# Patient Record
Sex: Male | Born: 1965 | Race: White | Hispanic: No | Smoking: Never smoker
Health system: Southern US, Community
[De-identification: ages and names within clinical notes are randomized; demographics above are authoritative.]

## PROBLEM LIST (undated history)

## (undated) DIAGNOSIS — F329 Major depressive disorder, single episode, unspecified: Secondary | ICD-10-CM

## (undated) DIAGNOSIS — J45909 Unspecified asthma, uncomplicated: Secondary | ICD-10-CM

## (undated) DIAGNOSIS — G629 Polyneuropathy, unspecified: Secondary | ICD-10-CM

## (undated) DIAGNOSIS — Z5189 Encounter for other specified aftercare: Secondary | ICD-10-CM

## (undated) DIAGNOSIS — G8929 Other chronic pain: Secondary | ICD-10-CM

## (undated) DIAGNOSIS — Z9109 Other allergy status, other than to drugs and biological substances: Secondary | ICD-10-CM

## (undated) DIAGNOSIS — G709 Myoneural disorder, unspecified: Secondary | ICD-10-CM

## (undated) DIAGNOSIS — M549 Dorsalgia, unspecified: Secondary | ICD-10-CM

## (undated) DIAGNOSIS — F32A Depression, unspecified: Secondary | ICD-10-CM

## (undated) DIAGNOSIS — T7840XA Allergy, unspecified, initial encounter: Secondary | ICD-10-CM

## (undated) DIAGNOSIS — I10 Essential (primary) hypertension: Secondary | ICD-10-CM

## (undated) DIAGNOSIS — H1010 Acute atopic conjunctivitis, unspecified eye: Secondary | ICD-10-CM

## (undated) DIAGNOSIS — F419 Anxiety disorder, unspecified: Secondary | ICD-10-CM

## (undated) HISTORY — DX: Other allergy status, other than to drugs and biological substances: Z91.09

## (undated) HISTORY — DX: Anxiety disorder, unspecified: F41.9

## (undated) HISTORY — PX: ROTATOR CUFF REPAIR: SHX139

## (undated) HISTORY — DX: Allergy, unspecified, initial encounter: T78.40XA

## (undated) HISTORY — DX: Encounter for other specified aftercare: Z51.89

## (undated) HISTORY — DX: Polyneuropathy, unspecified: G62.9

## (undated) HISTORY — PX: POLYPECTOMY: SHX149

## (undated) HISTORY — PX: BACK SURGERY: SHX140

## (undated) HISTORY — PX: CARDIAC CATHETERIZATION: SHX172

## (undated) HISTORY — PX: SPINAL CORD STIMULATOR IMPLANT: SHX2422

## (undated) HISTORY — PX: CARPAL TUNNEL RELEASE: SHX101

## (undated) HISTORY — DX: Myoneural disorder, unspecified: G70.9

## (undated) HISTORY — DX: Acute atopic conjunctivitis, unspecified eye: H10.10

## (undated) HISTORY — PX: COLONOSCOPY: SHX174

## (undated) NOTE — Telephone Encounter (Signed)
 Formatting of this note might be different from the original. Outreach call placed to patient to schedule. Patient states PCP is Lorene Pouch, NP at [facility]. Electronically signed by Charlcie Rosina BRAVO, CNA at 02/24/2024  9:15 AM CDT

---

## 2005-11-03 HISTORY — PX: HAND AMPUTATION: SUR26

## 2014-11-20 DIAGNOSIS — T879 Unspecified complications of amputation stump: Secondary | ICD-10-CM | POA: Insufficient documentation

## 2015-01-11 ENCOUNTER — Encounter (HOSPITAL_BASED_OUTPATIENT_CLINIC_OR_DEPARTMENT_OTHER): Payer: Self-pay | Admitting: *Deleted

## 2015-01-11 ENCOUNTER — Emergency Department (HOSPITAL_BASED_OUTPATIENT_CLINIC_OR_DEPARTMENT_OTHER)
Admission: EM | Admit: 2015-01-11 | Discharge: 2015-01-11 | Disposition: A | Discharge status: Home / Self Care | Payer: Medicare PPO | Attending: Emergency Medicine | Admitting: Emergency Medicine

## 2015-01-11 ENCOUNTER — Emergency Department (HOSPITAL_BASED_OUTPATIENT_CLINIC_OR_DEPARTMENT_OTHER): Payer: Medicare PPO

## 2015-01-11 DIAGNOSIS — Z79899 Other long term (current) drug therapy: Secondary | ICD-10-CM | POA: Diagnosis not present

## 2015-01-11 DIAGNOSIS — M549 Dorsalgia, unspecified: Secondary | ICD-10-CM | POA: Insufficient documentation

## 2015-01-11 DIAGNOSIS — Z7951 Long term (current) use of inhaled steroids: Secondary | ICD-10-CM | POA: Diagnosis not present

## 2015-01-11 DIAGNOSIS — R1110 Vomiting, unspecified: Secondary | ICD-10-CM

## 2015-01-11 DIAGNOSIS — G8929 Other chronic pain: Secondary | ICD-10-CM | POA: Diagnosis not present

## 2015-01-11 DIAGNOSIS — B349 Viral infection, unspecified: Secondary | ICD-10-CM | POA: Diagnosis not present

## 2015-01-11 HISTORY — DX: Other chronic pain: G89.29

## 2015-01-11 HISTORY — DX: Dorsalgia, unspecified: M54.9

## 2015-01-11 LAB — CBC WITH DIFFERENTIAL/PLATELET
Basophils Absolute: 0 10*3/uL (ref 0.0–0.1)
Basophils Relative: 0 % (ref 0–1)
Eosinophils Absolute: 0 10*3/uL (ref 0.0–0.7)
Eosinophils Relative: 0 % (ref 0–5)
HEMATOCRIT: 43 % (ref 39.0–52.0)
Hemoglobin: 15 g/dL (ref 13.0–17.0)
Lymphocytes Relative: 16 % (ref 12–46)
Lymphs Abs: 1.7 10*3/uL (ref 0.7–4.0)
MCH: 32.3 pg (ref 26.0–34.0)
MCHC: 34.9 g/dL (ref 30.0–36.0)
MCV: 92.7 fL (ref 78.0–100.0)
MONO ABS: 0.6 10*3/uL (ref 0.1–1.0)
Monocytes Relative: 5 % (ref 3–12)
NEUTROS ABS: 8.2 10*3/uL — AB (ref 1.7–7.7)
Neutrophils Relative %: 79 % — ABNORMAL HIGH (ref 43–77)
PLATELETS: 205 10*3/uL (ref 150–400)
RBC: 4.64 MIL/uL (ref 4.22–5.81)
RDW: 12.7 % (ref 11.5–15.5)
WBC: 10.5 10*3/uL (ref 4.0–10.5)

## 2015-01-11 LAB — URINALYSIS, ROUTINE W REFLEX MICROSCOPIC
Bilirubin Urine: NEGATIVE
Glucose, UA: NEGATIVE mg/dL
Hgb urine dipstick: NEGATIVE
KETONES UR: 40 mg/dL — AB
Leukocytes, UA: NEGATIVE
Nitrite: NEGATIVE
PROTEIN: NEGATIVE mg/dL
SPECIFIC GRAVITY, URINE: 1.019 (ref 1.005–1.030)
Urobilinogen, UA: 0.2 mg/dL (ref 0.0–1.0)
pH: 5 (ref 5.0–8.0)

## 2015-01-11 MED ORDER — SODIUM CHLORIDE 0.9 % IV BOLUS (SEPSIS): 1000.0000 mL | Freq: Once | INTRAVENOUS | Status: DC

## 2015-01-11 MED ORDER — OXYCODONE HCL 10 MG PO TABS: 10.0000 mg | ORAL_TABLET | Freq: Four times a day (QID) | ORAL | Status: DC

## 2015-01-11 MED ORDER — SODIUM CHLORIDE 0.9 % IV SOLN: INTRAVENOUS | Status: DC

## 2015-01-11 MED ORDER — PROMETHAZINE HCL 25 MG PO TABS
25.0000 mg | ORAL_TABLET | Freq: Four times a day (QID) | ORAL | Status: DC | PRN
Start: 2015-01-11 — End: 2015-07-21

## 2015-01-11 MED ORDER — ONDANSETRON HCL 4 MG/2ML IJ SOLN
4.0000 mg | Freq: Once | INTRAMUSCULAR | Status: AC
  Administered 2015-01-11: 4 mg via INTRAVENOUS
  Filled 2015-01-11: qty 2

## 2015-01-11 MED ORDER — FLUTICASONE-SALMETEROL 230-21 MCG/ACT IN AERO: 2.0000 | INHALATION_SPRAY | Freq: Two times a day (BID) | RESPIRATORY_TRACT | Status: DC

## 2015-01-11 MED ORDER — HYDROMORPHONE HCL 1 MG/ML IJ SOLN
1.0000 mg | Freq: Once | INTRAMUSCULAR | Status: AC
  Administered 2015-01-11: 1 mg via INTRAVENOUS
  Filled 2015-01-11: qty 1

## 2015-01-11 MED ORDER — GABAPENTIN 300 MG PO CAPS: 300.0000 mg | ORAL_CAPSULE | Freq: Three times a day (TID) | ORAL | Status: DC

## 2015-01-11 MED ORDER — ALBUTEROL SULFATE HFA 108 (90 BASE) MCG/ACT IN AERS: 1.0000 | INHALATION_SPRAY | Freq: Four times a day (QID) | RESPIRATORY_TRACT | Status: DC | PRN

## 2015-01-11 NOTE — Discharge Instructions (Signed)
Workup here negative. Most likely viral illness. Return for any new or worse symptoms. Medications renewed. Resource guide provided below to help you find a regular doctor.   Emergency Department Resource Guide 1) Find a Doctor and Pay Out of Pocket Although you won't have to find out who is covered by your insurance plan, it is a good idea to ask around and get recommendations. You will then need to call the office and see if the doctor you have chosen will accept you as a new patient and what types of options they offer for patients who are self-pay. Some doctors offer discounts or will set up payment plans for their patients who do not have insurance, but you will need to ask so you aren't surprised when you get to your appointment.  2) Contact Your Local Health Department Not all health departments have doctors that can see patients for sick visits, but many do, so it is worth a call to see if yours does. If you don't know where your local health department is, you can check in your phone book. The CDC also has a tool to help you locate your state's health department, and many state websites also have listings of all of their local health departments.  3) Find a [facility] If your illness is not likely to be very severe or complicated, you may want to try a walk in clinic. These are popping up all over the country in pharmacies, drugstores, and shopping centers. They're usually staffed by nurse practitioners or physician assistants that have been trained to treat common illnesses and complaints. They're usually fairly quick and inexpensive. However, if you have serious medical issues or chronic medical problems, these are probably not your best option.  No Primary Care Doctor: - Call Health Connect at  [phone] - they can help you locate a primary care doctor that  accepts your insurance, provides certain services, etc. - Physician Referral Service- [phone]  Chronic Pain  Problems: Organization         Address  Phone   Notes  [facility]  [phone] Patients need to be referred by their primary care doctor.   Medication Assistance: Organization         Address  Phone   Notes  [facility] Medication [facility] [address] [place], [place] [address] [phone] --Must be a resident of [facility] -- Must have NO insurance coverage whatsoever (no Medicaid/ Medicare, etc.) -- The pt. MUST have a primary care doctor that directs their care regularly and follows them in the community   MedAssist  [phone]   [employer]  [phone]    Agencies that provide inexpensive medical care: Organization         Address  Phone   Notes  [place]  [phone]   [facility]    [phone]   [facility] [place], [place] [address] [phone]   [place] [address] [place] [phone]   Planned Parenthood    [phone]   [facility]    [phone]   [place] and [place] [address],  Phone:  [phone], Fax:  [phone] Hours of Operation:  9 am - 6 pm, M-F.  Also accepts Medicaid/Medicare and self-pay.  [facility] for [place] [address], [address], [place] Phone: [phone], Fax: [phone]. Hours of Operation:  8:30 am - 5:30 pm, M-F.  Also accepts Medicaid and self-pay.  [facility] [place] [address] [place] Phone: [phone]   [place], [place], [place] [phone], Ext. [phone] Mondays & Thursdays: 7-9 AM.  First 15 patients are seen on a first come, first serve basis.    [place] Providers:  Organization         Address  Phone   Notes  [facility] [address], [place] [phone] Also  accepts self-pay patients.  [facility] [address], [place]  [phone]   [place], [address], [place] [phone]   [facility] [address] [place] [phone]   Lucianne Lei [address], [place]   [phone] Only accepts [place] Access [place] patients after they have their name applied to their card.   Self-Pay (no insurance) in [facility]:  Organization         Address  Phone   Notes  Sickle Cell Patients, [facility] [place] [phone]   [facility] [place] [phone]   [facility] [place]  [place], [place], [place] [phone]   [facility]/Dr. Osei-Bonsu  [address] [place] or [address], [place] [phone] Phone number for both [place] and [place] locations is the same.  Urgent [facility] and [facility] [address] [place] [phone]   [facility] [address] [place] or [address] [phone]   [facility] [address] [place] [phone], phone; [phone], fax Sees patients 1st and 3rd Saturday of every month.  Must not qualify for public or private insurance (i.e. Medicaid, Medicare, [place] Health Choice, Veterans' Benefits)  Household income should be no more than 200% of the poverty level The clinic cannot treat you if you are pregnant or think you are pregnant  Sexually transmitted diseases are not treated at the clinic.    Dental Care: Organization         Address  Phone  Notes  [facility] Department of [facility] [phone] Accepts children up to age 80 who are enrolled in [place] or [place]; pregnant  women with a Medicaid card; and children who have applied for Medicaid or [place] Health Choice, but were declined, whose parents can pay a reduced fee at time of service.  [facility] Department of [facility]  [address], [place] [phone] Accepts children up to age 55 who are enrolled in [place] or [place]; pregnant women with a Medicaid card; and children who have applied for Medicaid or [place] Health Choice, but were declined, whose parents can pay a reduced fee at time of service.  [place] Adult [facility] Access PROGRAM  [place] [phone] Patients are seen by appointment only. Walk-ins are not accepted. [place] will see patients 4 years of age and older. Monday - Tuesday (8am-5pm) Most Wednesdays (8:30-5pm) $30 per visit, cash only  [place]  [address], [facility] [phone] Patients are seen by appointment only. Walk-ins are not accepted. [place] will see patients 11 years of age and older. One Wednesday Evening (Monthly: Volunteer Based).  $30 per visit, cash only  [place]  [phone] for adults; Children under age 81, call Graduate Pediatric Dentistry at [phone]. Children aged 67-14, please call [phone] to request a pediatric application.  Dental services are provided in all areas of dental care including fillings, crowns and bridges, complete and partial dentures, implants, gum treatment, root canals, and extractions. Preventive care is also provided. Treatment is provided to both adults and children. Patients are selected via a lottery and there is often a waiting list.   [facility] [address], [place]  [phone] [email or web address]   [facility] [address] [place], [place] [phone], Ext. [phone] Second and Fourth Thursday of each month, opens at 6:30 AM; Clinic ends at 9 AM.  Patients are  seen on a first-come first-served basis, and a limited number are seen during each clinic.   [facility]  [address] [place] Dank[place], [place] [phone]   Eligibility Requirements You must have lived in [place], [place], or [place] counties for at least the last three months.   You cannot be eligible for state or federal sponsored [employer], including [employer], [place], or [employer].   You generally cannot be eligible for healthcare insurance through your employer.    How to apply: Eligibility screenings are held every Tuesday and Wednesday afternoon from 1:00 pm until 4:00 pm. You do not need an appointment for the interview!  [facility] [address], [place], [place]   [address]  [place] Department  [place]  [phone]    Behavioral Health Resources in the Community: Intensive Outpatient Programs Organization         Address  Phone  Notes  [place]. [address], [place], [place] [phone]   [facility] Outpatient [address] [place], [place]   ADS: Alcohol & Drug Svcs [address] [place], [place]   [place] [address]  [place], [place] or [phone]   Substance Abuse Resources Organization         Address  Phone  Notes  Alcohol and Drug Services  [phone]   [place]  [phone]   The [facility] [place]   Chinita Pester  [phone]   Residential & Outpatient Substance Abuse Program  [phone]   Psychological Services Organization         Address  Phone  Notes  [facility] [place]  [place]  [phone]   [place] [address] [place] or [phone]    Mobile Crisis  Teams Organization         Address  Phone  Notes  [facility], Mobile Crisis Care Unit  [phone]   [facility]  [address] [place], [place]   [place] Levels [address] [place] [phone]    Self-Help/Support Groups Organization         Address  Phone             Notes  [place]. of [place] -  variety of support groups  [phone] Call for more information  Narcotics Anonymous (NA), [facility] [address], [employer] [place]  2 meetings at this location   Residential Facilities manager         Address  Phone  Notes  [facility] [place],    [place]  [phone]   [facility]  [address] [place] [phone], [place], [place]   [place], [place] [phone] Admissions: 8am-3pm M-F  Incentives Substance [place] [address],    [place], [place] [phone]   [facility] [address] [place], [place], [place]   [facility] [address]  [place], [place]   Insight Programs - Intensive Outpatient [address], [place], [place]   [facility] ([place]  [place], [place] [phone] or [phone]   Residential Treatment Services (RTS) [address] [place], [place] Accepts Medicaid  [facility] [place] [address],  [place] [phone] Substance Abuse/Addiction Treatment   [facility] Organization         Address  Phone  Notes  [facility]  [phone]   Domenic Schwab, PhD [address] [place], [place]   [phone] or [phone]   [place], [place] [phone]   [facility] [address] [place], [place] [phone]  Insurance/Medicaid/sponsorship through [facility] and Families [address], [address] [place]                                    [place], [place] [phone] [place] [address], [place] [phone]    Dr. Adele Schilder  [phone]   [facility] Dept. 1) [address] [place] 2) [place] 3)  [address], [place] [phone]  [phone]   [place] [phone] or [phone] (After Hours)

## 2015-01-11 NOTE — ED Notes (Signed)
Pt sts he has been vomiting x2 days. Pt also c/o chills and dizziness. Pt recently moved to the area and is also requesting a referral to a pain clinic.

## 2015-01-11 NOTE — ED Provider Notes (Signed)
CSN: [id number]     Arrival date & time 01/11/15  1443 History   First MD Initiated Contact with Patient 01/11/15 1609     Chief Complaint  Patient presents with  . Emesis     (Consider location/radiation/quality/duration/timing/severity/associated sxs/prior Treatment) Patient is a 49 y.o. male presenting with vomiting. The history is provided by the patient.  Emesis Associated symptoms: chills and diarrhea   Associated symptoms: no abdominal pain and no headaches   patient presents with the onset of congestion nausea vomiting and diarrhea fever and chills starting yesterday. No significant belly pain. No significant headache.  Patient recently moved to the area from [place]. Patient with history of chronic back pain. Followed by pain management there. Patient's also out of all of his medications. And needs them renewed.  Past Medical History  Diagnosis Date  . Chronic back pain    Past Surgical History  Procedure Laterality Date  . Hand amputation     No family history on file. History  Substance Use Topics  . Smoking status: Never Smoker   . Smokeless tobacco: Not on file  . Alcohol Use: Yes    Review of Systems  Constitutional: Positive for fever and chills.  HENT: Positive for congestion.   Eyes: Negative for redness.  Respiratory: Positive for cough. Negative for shortness of breath.   Cardiovascular: Negative for chest pain.  Gastrointestinal: Positive for nausea, vomiting and diarrhea. Negative for abdominal pain.  Genitourinary: Negative for dysuria.  Musculoskeletal: Positive for back pain.  Neurological: Negative for headaches.  Hematological: Does not bruise/bleed easily.  Psychiatric/Behavioral: Negative for confusion.      Allergies  Aspirin and Hydrocodone  Home Medications   Prior to Admission medications   Medication Sig Start Date End Date Taking? Authorizing Provider  albuterol (ACCUNEB) 1.25 MG/3ML nebulizer solution Take 1 ampule by  nebulization every 6 (six) hours as needed for wheezing.   Yes Historical Provider, MD  fluticasone-salmeterol (ADVAIR HFA) 230-21 MCG/ACT inhaler Inhale 2 puffs into the lungs 2 (two) times daily.   Yes Historical Provider, MD  gabapentin (NEURONTIN) 300 MG capsule Take 300 mg by mouth 3 (three) times daily.   Yes Historical Provider, MD  oxycodone (OXY-IR) 5 MG capsule Take 10 mg by mouth every 4 (four) hours as needed.   Yes Historical Provider, MD  albuterol (PROVENTIL HFA;VENTOLIN HFA) 108 (90 BASE) MCG/ACT inhaler Inhale 1-2 puffs into the lungs every 6 (six) hours as needed for wheezing or shortness of breath. 01/11/15   Fredia Sorrow, MD  fluticasone-salmeterol (ADVAIR HFA) 230-21 MCG/ACT inhaler Inhale 2 puffs into the lungs 2 (two) times daily. 01/11/15   Fredia Sorrow, MD  gabapentin (NEURONTIN) 300 MG capsule Take 1 capsule (300 mg total) by mouth 3 (three) times daily. 01/11/15   Fredia Sorrow, MD  Oxycodone HCl 10 MG TABS Take 1 tablet (10 mg total) by mouth every 6 (six) hours. 01/11/15   Fredia Sorrow, MD  promethazine (PHENERGAN) 25 MG tablet Take 1 tablet (25 mg total) by mouth every 6 (six) hours as needed for nausea or vomiting. 01/11/15   Fredia Sorrow, MD   BP 138/78 mmHg  Pulse 93  Temp(Src) 98.3 F (36.8 C) (Oral)  Resp 18  Ht 5\' 7"  (1.702 m)  Wt 250 lb (113.399 kg)  BMI 39.15 kg/m2  SpO2 95% Physical Exam  Constitutional: He is oriented to person, place, and time. He appears well-developed and well-nourished. No distress.  HENT:  Head: Normocephalic and atraumatic.  Mouth/Throat: Oropharynx  is clear and moist.  Eyes: Conjunctivae and EOM are normal. Pupils are equal, round, and reactive to light.  Neck: Normal range of motion.  Cardiovascular: Normal rate, regular rhythm and normal heart sounds.   Pulmonary/Chest: Effort normal and breath sounds normal. No respiratory distress.  Abdominal: Soft. Bowel sounds are normal. There is no tenderness.   Musculoskeletal: Normal range of motion.  Left hand amputation.  Neurological: He is alert and oriented to person, place, and time. No cranial nerve deficit. He exhibits normal muscle tone. Coordination normal.  Skin: Skin is warm.  Vitals reviewed.   ED Course  Procedures (including critical care time) Labs Review Labs Reviewed  URINALYSIS, ROUTINE W REFLEX MICROSCOPIC - Abnormal; Notable for the following:    Ketones, ur 40 (*)    All other components within normal limits  CBC WITH DIFFERENTIAL/PLATELET - Abnormal; Notable for the following:    Neutrophils Relative % 79 (*)    Neutro Abs 8.2 (*)    All other components within normal limits   Results for orders placed or performed during the hospital encounter of 01/11/15  Urinalysis, Routine w reflex microscopic  Result Value Ref Range   Color, Urine YELLOW YELLOW   APPearance CLEAR CLEAR   Specific Gravity, Urine 1.019 1.005 - 1.030   pH 5.0 5.0 - 8.0   Glucose, UA NEGATIVE NEGATIVE mg/dL   Hgb urine dipstick NEGATIVE NEGATIVE   Bilirubin Urine NEGATIVE NEGATIVE   Ketones, ur 40 (A) NEGATIVE mg/dL   Protein, ur NEGATIVE NEGATIVE mg/dL   Urobilinogen, UA 0.2 0.0 - 1.0 mg/dL   Nitrite NEGATIVE NEGATIVE   Leukocytes, UA NEGATIVE NEGATIVE  CBC with Differential/Platelet  Result Value Ref Range   WBC 10.5 4.0 - 10.5 K/uL   RBC 4.64 4.22 - 5.81 MIL/uL   Hemoglobin 15.0 13.0 - 17.0 g/dL   HCT 43.0 39.0 - 52.0 %   MCV 92.7 78.0 - 100.0 fL   MCH 32.3 26.0 - 34.0 pg   MCHC 34.9 30.0 - 36.0 g/dL   RDW 12.7 11.5 - 15.5 %   Platelets 205 150 - 400 K/uL   Neutrophils Relative % 79 (H) 43 - 77 %   Neutro Abs 8.2 (H) 1.7 - 7.7 K/uL   Lymphocytes Relative 16 12 - 46 %   Lymphs Abs 1.7 0.7 - 4.0 K/uL   Monocytes Relative 5 3 - 12 %   Monocytes Absolute 0.6 0.1 - 1.0 K/uL   Eosinophils Relative 0 0 - 5 %   Eosinophils Absolute 0.0 0.0 - 0.7 K/uL   Basophils Relative 0 0 - 1 %   Basophils Absolute 0.0 0.0 - 0.1 K/uL     Imaging Review Dg Abd Acute W/chest  01/11/2015   CLINICAL DATA:  Nausea, vomiting, chest pain and constipation for 1 day. Initial encounter.  EXAM: ACUTE ABDOMEN SERIES (ABDOMEN 2 VIEW & CHEST 1 VIEW)  COMPARISON:  None.  FINDINGS: Frontal examination of the chest demonstrates the heart size to be at the upper limits of normal. There is pleural thickening versus prominent extrapleural fat deposition bilaterally. Mild atelectasis is present at both lung bases. There is no edema, confluent airspace opacity or significant pleural effusion.  The visualized bowel gas pattern is normal. There is no free intraperitoneal air or suspicious abdominal calcification. Small pelvic calcifications are likely phleboliths. Thoracic spinal stimulator extends to the T8 level.  IMPRESSION: 1. No acute cardiopulmonary or abdominal process identified. 2. Borderline heart size and mild bibasilar atelectasis.  Electronically Signed   By: Richardean Sale M.D.   On: 01/11/2015 17:05     EKG Interpretation None      MDM   Final diagnoses:  Vomiting  Viral illness  Chronic pain    Patient reports being new to the area. Patient has history of chronic pain. Personally [place] here from was [place]. Patient presents today with a complaint of questionable flu or viral illness. Have a cough nausea vomiting diarrhea started yesterday. Associated with fever and chills. No significant belly pain. No significant headache. Patient's chronic pain is related to his back. Patient will be given a resource guide it plugged into a primary care doctor here and to pain clinic. Workup negative. Without any significant abnormalities. Patient also requires a renewal of his medicines. That includes albuterol Advair Neurontin and oxycodone. Limited amounts of all those medications provided. In addition patient given a prescription for Phenergan for the nausea and vomiting. Patient nontoxic no acute distress.    Fredia Sorrow,  MD 01/11/15 1750

## 2015-01-16 ENCOUNTER — Telehealth: Payer: Self-pay | Admitting: *Deleted

## 2015-01-16 NOTE — Telephone Encounter (Signed)
Pharmacy called r/t fluticasone-salmeterol (ADVAIR HFA) 230-21 MCG/ACT inhaler on back order; asked to change to Diskus.

## 2015-01-18 ENCOUNTER — Telehealth: Payer: Self-pay

## 2015-01-18 NOTE — Telephone Encounter (Signed)
Left message for call back Non-identifiable   NEW PATIENT

## 2015-01-19 ENCOUNTER — Ambulatory Visit: Payer: Medicare PPO | Admitting: Physician Assistant

## 2015-01-23 ENCOUNTER — Encounter: Payer: Self-pay | Admitting: Physician Assistant

## 2015-01-23 ENCOUNTER — Ambulatory Visit (INDEPENDENT_AMBULATORY_CARE_PROVIDER_SITE_OTHER): Payer: Medicare PPO | Admitting: Physician Assistant

## 2015-01-23 VITALS — BP 138/94 | HR 76 | Temp 98.2°F | Resp 16 | Ht 67.0 in | Wt 245.0 lb

## 2015-01-23 DIAGNOSIS — M549 Dorsalgia, unspecified: Secondary | ICD-10-CM | POA: Diagnosis not present

## 2015-01-23 DIAGNOSIS — G8929 Other chronic pain: Secondary | ICD-10-CM

## 2015-01-23 DIAGNOSIS — T85192A Other mechanical complication of implanted electronic neurostimulator (electrode) of spinal cord, initial encounter: Secondary | ICD-10-CM | POA: Diagnosis not present

## 2015-01-23 MED ORDER — OXYCODONE HCL 10 MG PO TABS: 10.0000 mg | ORAL_TABLET | Freq: Four times a day (QID) | ORAL | Status: DC

## 2015-01-23 NOTE — Progress Notes (Signed)
Pre visit review using our clinic review tool, if applicable. No additional management support is needed unless otherwise documented below in the visit note/SLS  

## 2015-01-23 NOTE — Assessment & Plan Note (Signed)
Status post placement of spinal cord stimulator. Doing well overall but has been out of medications for a few days. Pain is significant at present. No new or worsening symptoms. Will restart gabapentin 800 mg 3 times daily. We'll reinstate oxycodone 10 mg every 6 hours.  Referral placed to Neurosurgery and Pain Management. Follow-up ASAP for CPE.

## 2015-01-23 NOTE — Progress Notes (Signed)
Patient presents to clinic today to establish care.  Patient with significant history of chronic low back pain, status post spinal surgery and a spinal cord stimulator. Endorses daily pain despite improvement with stimulator. Is currently on gabapentin 800 mg 3 times a day. Uses oxycodone 10 mg every 6 hours as needed for breakthrough pain. Just moved to [place]. Is requesting a neurosurgeon and pain management specialist.  Health Maintenance: Dental -- up-to-date Vision --up-to-date Immunizations --tetanus up-to-date (March 2011). Patient declines flu.   Past Medical History  Diagnosis Date  . Chronic back pain     Neurostimulator  . Seasonal allergic conjunctivitis   . Environmental allergies   . Neuropathy     Past Surgical History  Procedure Laterality Date  . Hand amputation  2007  . Carpal tunnel release      Bilateral  . Rotator cuff repair      Left    Current Outpatient Prescriptions on File Prior to Visit  Medication Sig Dispense Refill  . albuterol (ACCUNEB) 1.25 MG/3ML nebulizer solution Take 1 ampule by nebulization every 6 (six) hours as needed for wheezing.    Marland Kitchen albuterol (PROVENTIL HFA;VENTOLIN HFA) 108 (90 BASE) MCG/ACT inhaler Inhale 1-2 puffs into the lungs every 6 (six) hours as needed for wheezing or shortness of breath. 1 Inhaler 1  . fluticasone-salmeterol (ADVAIR HFA) 230-21 MCG/ACT inhaler Inhale 2 puffs into the lungs 2 (two) times daily.    Marland Kitchen gabapentin (NEURONTIN) 300 MG capsule Take 800 mg by mouth 3 (three) times daily.    . promethazine (PHENERGAN) 25 MG tablet Take 1 tablet (25 mg total) by mouth every 6 (six) hours as needed for nausea or vomiting. 12 tablet 1   No current facility-administered medications on file prior to visit.    Allergies  Allergen Reactions  . Aspirin Swelling  . Hydrocodone Itching    Family History  Problem Relation Age of Onset  . Diabetes Mother     Living  . Diabetes Father 62    Deceased  .  Sleep apnea Mother   . Hypertension Father   . Jaundice Father   . Hemophilia Father   . Alcoholism Father   . Cancer Maternal Grandfather     Throat  . Cancer Paternal Uncle     Throat  . Asthma Sister   . Diabetes Sister   . Healthy Son     X1  . Healthy Daughter     X1    History   Social History  . Marital Status: Divorced    Spouse Name: N/A  . Number of Children: N/A  . Years of Education: N/A   Occupational History  . Not on file.   Social History Main Topics  . Smoking status: Never Smoker   . Smokeless tobacco: Never Used  . Alcohol Use: 0.0 oz/week    0 Standard drinks or equivalent per week  . Drug Use: No  . Sexual Activity: Not on file   Other Topics Concern  . Not on file   Social History Narrative   ROS Pertinent review of systems are listed in the history of present illness.  BP 138/94 mmHg  Pulse 76  Temp(Src) 98.2 F (36.8 C) (Oral)  Resp 16  Ht 5\' 7"  (1.702 m)  Wt 245 lb (111.131 kg)  BMI 38.36 kg/m2  SpO2 98%  Physical Exam  Constitutional: He is oriented to person, place, and time and well-developed, well-nourished, and in no distress.  Cardiovascular:  Normal rate, regular rhythm, normal heart sounds and intact distal pulses.   Pulmonary/Chest: Effort normal and breath sounds normal. No respiratory distress. He has no wheezes. He has no rales. He exhibits no tenderness.  Neurological: He is alert and oriented to person, place, and time.  Skin: Skin is warm and dry. No rash noted.  Psychiatric: Affect normal.  Vitals reviewed.   Recent Results (from the past 2160 hour(s))  Urinalysis, Routine w reflex microscopic     Status: Abnormal   Collection Time: 01/11/15  3:10 PM  Result Value Ref Range   Color, Urine YELLOW YELLOW   APPearance CLEAR CLEAR   Specific Gravity, Urine 1.019 1.005 - 1.030   pH 5.0 5.0 - 8.0   Glucose, UA NEGATIVE NEGATIVE mg/dL   Hgb urine dipstick NEGATIVE NEGATIVE   Bilirubin Urine NEGATIVE NEGATIVE    Ketones, ur 40 (A) NEGATIVE mg/dL   Protein, ur NEGATIVE NEGATIVE mg/dL   Urobilinogen, UA 0.2 0.0 - 1.0 mg/dL   Nitrite NEGATIVE NEGATIVE   Leukocytes, UA NEGATIVE NEGATIVE    Comment: MICROSCOPIC NOT DONE ON URINES WITH NEGATIVE PROTEIN, BLOOD, LEUKOCYTES, NITRITE, OR GLUCOSE <1000 mg/dL.  CBC with Differential/Platelet     Status: Abnormal   Collection Time: 01/11/15  4:40 PM  Result Value Ref Range   WBC 10.5 4.0 - 10.5 K/uL   RBC 4.64 4.22 - 5.81 MIL/uL   Hemoglobin 15.0 13.0 - 17.0 g/dL   HCT 43.0 39.0 - 52.0 %   MCV 92.7 78.0 - 100.0 fL   MCH 32.3 26.0 - 34.0 pg   MCHC 34.9 30.0 - 36.0 g/dL   RDW 12.7 11.5 - 15.5 %   Platelets 205 150 - 400 K/uL   Neutrophils Relative % 79 (H) 43 - 77 %   Neutro Abs 8.2 (H) 1.7 - 7.7 K/uL   Lymphocytes Relative 16 12 - 46 %   Lymphs Abs 1.7 0.7 - 4.0 K/uL   Monocytes Relative 5 3 - 12 %   Monocytes Absolute 0.6 0.1 - 1.0 K/uL   Eosinophils Relative 0 0 - 5 %   Eosinophils Absolute 0.0 0.0 - 0.7 K/uL   Basophils Relative 0 0 - 1 %   Basophils Absolute 0.0 0.0 - 0.1 K/uL    Assessment/Plan: Chronic back pain greater than 3 months duration Status post placement of spinal cord stimulator. Doing well overall but has been out of medications for a few days. Pain is significant at present. No new or worsening symptoms. Will restart gabapentin 800 mg 3 times daily. We'll reinstate oxycodone 10 mg every 6 hours.  Referral placed to Neurosurgery and Pain Management. Follow-up ASAP for CPE.

## 2015-01-23 NOTE — Patient Instructions (Signed)
Please continue medications as directed. Call me once you have found out the name of the mail order pharmacy would like to use.  You'll be contacted by neurosurgery and pain management to schedule appointments.  Please schedule an appointment for a complete physical with me.  Welcome to [facility]! It was a pleasure taking care of you today

## 2015-01-31 ENCOUNTER — Encounter: Payer: Self-pay | Admitting: Physician Assistant

## 2015-01-31 ENCOUNTER — Ambulatory Visit (INDEPENDENT_AMBULATORY_CARE_PROVIDER_SITE_OTHER): Payer: Medicare PPO | Admitting: Physician Assistant

## 2015-01-31 VITALS — BP 144/89 | HR 80 | Temp 97.8°F | Resp 16 | Ht 67.0 in | Wt 242.0 lb

## 2015-01-31 DIAGNOSIS — G8929 Other chronic pain: Secondary | ICD-10-CM

## 2015-01-31 DIAGNOSIS — M549 Dorsalgia, unspecified: Secondary | ICD-10-CM

## 2015-01-31 DIAGNOSIS — J4540 Moderate persistent asthma, uncomplicated: Secondary | ICD-10-CM

## 2015-01-31 DIAGNOSIS — Z0000 Encounter for general adult medical examination without abnormal findings: Secondary | ICD-10-CM | POA: Diagnosis not present

## 2015-01-31 DIAGNOSIS — Z23 Encounter for immunization: Secondary | ICD-10-CM | POA: Diagnosis not present

## 2015-01-31 DIAGNOSIS — B353 Tinea pedis: Secondary | ICD-10-CM

## 2015-01-31 LAB — URINALYSIS, ROUTINE W REFLEX MICROSCOPIC
Bilirubin Urine: NEGATIVE
KETONES UR: NEGATIVE
Leukocytes, UA: NEGATIVE
NITRITE: NEGATIVE
Specific Gravity, Urine: >=1.03 — AB (ref 1.000–1.030)
TOTAL PROTEIN, URINE-UPE24: NEGATIVE
URINE GLUCOSE: NEGATIVE
Urobilinogen, UA: 0.2 (ref 0.0–1.0)
pH: 5 (ref 5.0–8.0)

## 2015-01-31 LAB — CBC
HEMATOCRIT: 43.5 % (ref 39.0–52.0)
Hemoglobin: 15.2 g/dL (ref 13.0–17.0)
MCHC: 34.9 g/dL (ref 30.0–36.0)
MCV: 93 fl (ref 78.0–100.0)
PLATELETS: 177 10*3/uL (ref 150.0–400.0)
RBC: 4.67 Mil/uL (ref 4.22–5.81)
RDW: 13.1 % (ref 11.5–15.5)
WBC: 8 10*3/uL (ref 4.0–10.5)

## 2015-01-31 LAB — LIPID PANEL
Cholesterol: 208 mg/dL — ABNORMAL HIGH (ref 0–200)
HDL: 49.7 mg/dL (ref >39.00)
LDL CALC: 119 mg/dL — AB (ref 0–99)
NONHDL: 158.3
TRIGLYCERIDES: 198 mg/dL — AB (ref 0.0–149.0)
Total CHOL/HDL Ratio: 4
VLDL: 39.6 mg/dL (ref 0.0–40.0)

## 2015-01-31 LAB — HEPATIC FUNCTION PANEL
ALT: 47 U/L (ref 0–53)
AST: 40 U/L — ABNORMAL HIGH (ref 0–37)
Albumin: 4.2 g/dL (ref 3.5–5.2)
Alkaline Phosphatase: 93 U/L (ref 39–117)
Bilirubin, Direct: 0.1 mg/dL (ref 0.0–0.3)
Total Bilirubin: 0.3 mg/dL (ref 0.2–1.2)
Total Protein: 7.4 g/dL (ref 6.0–8.3)

## 2015-01-31 LAB — BASIC METABOLIC PANEL
BUN: 13 mg/dL (ref 6–23)
CALCIUM: 9.4 mg/dL (ref 8.4–10.5)
CO2: 25 mEq/L (ref 19–32)
Chloride: 105 mEq/L (ref 96–112)
Creatinine, Ser: 0.95 mg/dL (ref 0.40–1.50)
GFR: 90 mL/min (ref >60.00)
Glucose, Bld: 104 mg/dL — ABNORMAL HIGH (ref 70–99)
Potassium: 4.1 mEq/L (ref 3.5–5.1)
SODIUM: 135 meq/L (ref 135–145)

## 2015-01-31 LAB — VITAMIN D 25 HYDROXY (VIT D DEFICIENCY, FRACTURES): VITD: 18.45 ng/mL — ABNORMAL LOW (ref 30.00–100.00)

## 2015-01-31 LAB — HEMOGLOBIN A1C: Hgb A1c MFr Bld: 5.7 % (ref 4.6–6.5)

## 2015-01-31 LAB — TSH: TSH: 2.28 u[IU]/mL (ref 0.35–4.50)

## 2015-01-31 MED ORDER — GABAPENTIN 300 MG PO CAPS: 600.0000 mg | ORAL_CAPSULE | Freq: Three times a day (TID) | ORAL | Status: DC

## 2015-01-31 MED ORDER — ALBUTEROL SULFATE HFA 108 (90 BASE) MCG/ACT IN AERS: 1.0000 | INHALATION_SPRAY | Freq: Four times a day (QID) | RESPIRATORY_TRACT | Status: DC | PRN

## 2015-01-31 MED ORDER — ALBUTEROL SULFATE 1.25 MG/3ML IN NEBU: 1.0000 | INHALATION_SOLUTION | Freq: Four times a day (QID) | RESPIRATORY_TRACT | Status: DC | PRN

## 2015-01-31 MED ORDER — CLOTRIMAZOLE-BETAMETHASONE 1-0.05 % EX CREA: 1.0000 "application " | TOPICAL_CREAM | Freq: Two times a day (BID) | CUTANEOUS | Status: DC

## 2015-01-31 MED ORDER — FLUTICASONE-SALMETEROL 230-21 MCG/ACT IN AERO: 2.0000 | INHALATION_SPRAY | Freq: Two times a day (BID) | RESPIRATORY_TRACT | Status: DC

## 2015-01-31 NOTE — Patient Instructions (Signed)
Please go to the lab for blood work. I will call you with your results. Continue your chronic medications as directed. Use the Lotrisone cream twice daily for 3-4 weeks as directed.  Try out the vinegar soaks for your nails.  Preventive Care for Adults A healthy lifestyle and preventive care can promote health and wellness. Preventive health guidelines for men include the following key practices:  A routine yearly physical is a good way to check with your health care provider about your health and preventative screening. It is a chance to share any concerns and updates on your health and to receive a thorough exam.  Visit your dentist for a routine exam and preventative care every 6 months. Brush your teeth twice a day and floss once a day. Good oral hygiene prevents tooth decay and gum disease.  The frequency of eye exams is based on your age, health, family medical history, use of contact lenses, and other factors. Follow your health care provider's recommendations for frequency of eye exams.  Eat a healthy diet. Foods such as vegetables, fruits, whole grains, low-fat dairy products, and lean protein foods contain the nutrients you need without too many calories. Decrease your intake of foods high in solid fats, added sugars, and salt. Eat the right amount of calories for you.Get information about a proper diet from your health care provider, if necessary.  Regular physical exercise is one of the most important things you can do for your health. Most adults should get at least 150 minutes of moderate-intensity exercise (any activity that increases your heart rate and causes you to sweat) each week. In addition, most adults need muscle-strengthening exercises on 2 or more days a week.  Maintain a healthy weight. The body mass index (BMI) is a screening tool to identify possible weight problems. It provides an estimate of body fat based on height and weight. Your health care provider can find  your BMI and can help you achieve or maintain a healthy weight.For adults 20 years and older:  A BMI below 18.5 is considered underweight.  A BMI of 18.5 to 24.9 is normal.  A BMI of 25 to 29.9 is considered overweight.  A BMI of 30 and above is considered obese.  Maintain normal blood lipids and cholesterol levels by exercising and minimizing your intake of saturated fat. Eat a balanced diet with plenty of fruit and vegetables. Blood tests for lipids and cholesterol should begin at age 73 and be repeated every 5 years. If your lipid or cholesterol levels are high, you are over 50, or you are at high risk for heart disease, you may need your cholesterol levels checked more frequently.Ongoing high lipid and cholesterol levels should be treated with medicines if diet and exercise are not working.  If you smoke, find out from your health care provider how to quit. If you do not use tobacco, do not start.  Lung cancer screening is recommended for adults aged 28-80 years who are at high risk for developing lung cancer because of a history of smoking. A yearly low-dose CT scan of the lungs is recommended for people who have at least a 30-pack-year history of smoking and are a current smoker or have quit within the past 15 years. A pack year of smoking is smoking an average of 1 pack of cigarettes a day for 1 year (for example: 1 pack a day for 30 years or 2 packs a day for 15 years). Yearly screening should continue until the  smoker has stopped smoking for at least 15 years. Yearly screening should be stopped for people who develop a health problem that would prevent them from having lung cancer treatment.  If you choose to drink alcohol, do not have more than 2 drinks per day. One drink is considered to be 12 ounces (355 mL) of beer, 5 ounces (148 mL) of wine, or 1.5 ounces (44 mL) of liquor.  Avoid use of street drugs. Do not share needles with anyone. Ask for help if you need support or instructions  about stopping the use of drugs.  High blood pressure causes heart disease and increases the risk of stroke. Your blood pressure should be checked at least every 1-2 years. Ongoing high blood pressure should be treated with medicines, if weight loss and exercise are not effective.  If you are 49-74 years old, ask your health care provider if you should take aspirin to prevent heart disease.  Diabetes screening involves taking a blood sample to check your fasting blood sugar level. This should be done once every 3 years, after age 108, if you are within normal weight and without risk factors for diabetes. Testing should be considered at a younger age or be carried out more frequently if you are overweight and have at least 1 risk factor for diabetes.  Colorectal cancer can be detected and often prevented. Most routine colorectal cancer screening begins at the age of 33 and continues through age 45. However, your health care provider may recommend screening at an earlier age if you have risk factors for colon cancer. On a yearly basis, your health care provider may provide home test kits to check for hidden blood in the stool. Use of a small camera at the end of a tube to directly examine the colon (sigmoidoscopy or colonoscopy) can detect the earliest forms of colorectal cancer. Talk to your health care provider about this at age 5, when routine screening begins. Direct exam of the colon should be repeated every 5-10 years through age 61, unless early forms of precancerous polyps or small growths are found.  People who are at an increased risk for hepatitis B should be screened for this virus. You are considered at high risk for hepatitis B if:  You were born in a country where hepatitis B occurs often. Talk with your health care provider about which countries are considered high risk.  Your parents were born in a high-risk country and you have not received a shot to protect against hepatitis B  (hepatitis B vaccine).  You have HIV or AIDS.  You use needles to inject street drugs.  You live with, or have sex with, someone who has hepatitis B.  You are a man who has sex with other men (MSM).  You get hemodialysis treatment.  You take certain medicines for conditions such as cancer, organ transplantation, and autoimmune conditions.  Hepatitis C blood testing is recommended for all people born from [birth year] through 1965 and any individual with known risks for hepatitis C.  Practice safe sex. Use condoms and avoid high-risk sexual practices to reduce the spread of sexually transmitted infections (STIs). STIs include gonorrhea, chlamydia, syphilis, trichomonas, herpes, HPV, and human immunodeficiency virus (HIV). Herpes, HIV, and HPV are viral illnesses that have no cure. They can result in disability, cancer, and death.  If you are at risk of being infected with HIV, it is recommended that you take a prescription medicine daily to prevent HIV infection. This is called preexposure  prophylaxis (PrEP). You are considered at risk if:  You are a man who has sex with other men (MSM) and have other risk factors.  You are a heterosexual man, are sexually active, and are at increased risk for HIV infection.  You take drugs by injection.  You are sexually active with a partner who has HIV.  Talk with your health care provider about whether you are at high risk of being infected with HIV. If you choose to begin PrEP, you should first be tested for HIV. You should then be tested every 3 months for as long as you are taking PrEP.  A one-time screening for abdominal aortic aneurysm (AAA) and surgical repair of large AAAs by ultrasound are recommended for men ages 58 to 77 years who are current or former smokers.  Healthy men should no longer receive prostate-specific antigen (PSA) blood tests as part of routine cancer screening. Talk with your health care provider about prostate cancer  screening.  Testicular cancer screening is not recommended for adult males who have no symptoms. Screening includes self-exam, a health care provider exam, and other screening tests. Consult with your health care provider about any symptoms you have or any concerns you have about testicular cancer.  Use sunscreen. Apply sunscreen liberally and repeatedly throughout the day. You should seek shade when your shadow is shorter than you. Protect yourself by wearing long sleeves, pants, a wide-brimmed hat, and sunglasses year round, whenever you are outdoors.  Once a month, do a whole-body skin exam, using a mirror to look at the skin on your back. Tell your health care provider about new moles, moles that have irregular borders, moles that are larger than a pencil eraser, or moles that have changed in shape or color.  Stay current with required vaccines (immunizations).  Influenza vaccine. All adults should be immunized every year.  Tetanus, diphtheria, and acellular pertussis (Td, Tdap) vaccine. An adult who has not previously received Tdap or who does not know his vaccine status should receive 1 dose of Tdap. This initial dose should be followed by tetanus and diphtheria toxoids (Td) booster doses every 10 years. Adults with an unknown or incomplete history of completing a 3-dose immunization series with Td-containing vaccines should begin or complete a primary immunization series including a Tdap dose. Adults should receive a Td booster every 10 years.  Varicella vaccine. An adult without evidence of immunity to varicella should receive 2 doses or a second dose if he has previously received 1 dose.  Human papillomavirus (HPV) vaccine. Males aged 109-21 years who have not received the vaccine previously should receive the 3-dose series. Males aged 22-26 years may be immunized. Immunization is recommended through the age of 31 years for any male who has sex with males and did not get any or all doses  earlier. Immunization is recommended for any person with an immunocompromised condition through the age of 9 years if he did not get any or all doses earlier. During the 3-dose series, the second dose should be obtained 4-8 weeks after the first dose. The third dose should be obtained 24 weeks after the first dose and 16 weeks after the second dose.  Zoster vaccine. One dose is recommended for adults aged 27 years or older unless certain conditions are present.  Measles, mumps, and rubella (MMR) vaccine. Adults born before [birth year] generally are considered immune to measles and mumps. Adults born in [birth year] or later should have 1 or more doses of MMR vaccine  unless there is a contraindication to the vaccine or there is laboratory evidence of immunity to each of the three diseases. A routine second dose of MMR vaccine should be obtained at least 28 days after the first dose for students attending postsecondary schools, health care workers, or international travelers. People who received inactivated measles vaccine or an unknown type of measles vaccine during 1963-1967 should receive 2 doses of MMR vaccine. People who received inactivated mumps vaccine or an unknown type of mumps vaccine before 1979 and are at high risk for mumps infection should consider immunization with 2 doses of MMR vaccine. Unvaccinated health care workers born before [birth year] who lack laboratory evidence of measles, mumps, or rubella immunity or laboratory confirmation of disease should consider measles and mumps immunization with 2 doses of MMR vaccine or rubella immunization with 1 dose of MMR vaccine.  Pneumococcal 13-valent conjugate (PCV13) vaccine. When indicated, a person who is uncertain of his immunization history and has no record of immunization should receive the PCV13 vaccine. An adult aged 90 years or older who has certain medical conditions and has not been previously immunized should receive 1 dose of PCV13 vaccine. This PCV13  should be followed with a dose of pneumococcal polysaccharide (PPSV23) vaccine. The PPSV23 vaccine dose should be obtained at least 8 weeks after the dose of PCV13 vaccine. An adult aged 70 years or older who has certain medical conditions and previously received 1 or more doses of PPSV23 vaccine should receive 1 dose of PCV13. The PCV13 vaccine dose should be obtained 1 or more years after the last PPSV23 vaccine dose.  Pneumococcal polysaccharide (PPSV23) vaccine. When PCV13 is also indicated, PCV13 should be obtained first. All adults aged 41 years and older should be immunized. An adult younger than age 39 years who has certain medical conditions should be immunized. Any person who resides in a nursing home or long-term care facility should be immunized. An adult smoker should be immunized. People with an immunocompromised condition and certain other conditions should receive both PCV13 and PPSV23 vaccines. People with human immunodeficiency virus (HIV) infection should be immunized as soon as possible after diagnosis. Immunization during chemotherapy or radiation therapy should be avoided. Routine use of PPSV23 vaccine is not recommended for American Indians, [place] Natives, or people younger than 65 years unless there are medical conditions that require PPSV23 vaccine. When indicated, people who have unknown immunization and have no record of immunization should receive PPSV23 vaccine. One-time revaccination 5 years after the first dose of PPSV23 is recommended for people aged 19-64 years who have chronic kidney failure, nephrotic syndrome, asplenia, or immunocompromised conditions. People who received 1-2 doses of PPSV23 before age 53 years should receive another dose of PPSV23 vaccine at age 87 years or later if at least 5 years have passed since the previous dose. Doses of PPSV23 are not needed for people immunized with PPSV23 at or after age 97 years.  Meningococcal vaccine. Adults with asplenia or  persistent complement component deficiencies should receive 2 doses of quadrivalent meningococcal conjugate (MenACWY-D) vaccine. The doses should be obtained at least 2 months apart. Microbiologists working with certain meningococcal bacteria, [employer] recruits, people at risk during an outbreak, and people who travel to or live in countries with a high rate of meningitis should be immunized. A first-year college student up through age 55 years who is living in a residence hall should receive a dose if he did not receive a dose on or after his 16th birthday.  Adults who have certain high-risk conditions should receive one or more doses of vaccine.  Hepatitis A vaccine. Adults who wish to be protected from this disease, have certain high-risk conditions, work with hepatitis A-infected animals, work in hepatitis A research labs, or travel to or work in countries with a high rate of hepatitis A should be immunized. Adults who were previously unvaccinated and who anticipate close contact with an international adoptee during the first 60 days after arrival in the [place] from a country with a high rate of hepatitis A should be immunized.  Hepatitis B vaccine. Adults should be immunized if they wish to be protected from this disease, have certain high-risk conditions, may be exposed to blood or other infectious body fluids, are household contacts or sex partners of hepatitis B positive people, are clients or workers in certain care facilities, or travel to or work in countries with a high rate of hepatitis B.  Haemophilus influenzae type b (Hib) vaccine. A previously unvaccinated person with asplenia or sickle cell disease or having a scheduled splenectomy should receive 1 dose of Hib vaccine. Regardless of previous immunization, a recipient of a hematopoietic stem cell transplant should receive a 3-dose series 6-12 months after his successful transplant. Hib vaccine is not recommended for adults with HIV  infection. Preventive Service / Frequency Ages 33 to 27  Blood pressure check.** / Every 1 to 2 years.  Lipid and cholesterol check.** / Every 5 years beginning at age 16.  Hepatitis C blood test.** / For any individual with known risks for hepatitis C.  Skin self-exam. / Monthly.  Influenza vaccine. / Every year.  Tetanus, diphtheria, and acellular pertussis (Tdap, Td) vaccine.** / Consult your health care provider. 1 dose of Td every 10 years.  Varicella vaccine.** / Consult your health care provider.  HPV vaccine. / 3 doses over 6 months, if 51 or younger.  Measles, mumps, rubella (MMR) vaccine.** / You need at least 1 dose of MMR if you were born in 1957 or later. You may also need a second dose.  Pneumococcal 13-valent conjugate (PCV13) vaccine.** / Consult your health care provider.  Pneumococcal polysaccharide (PPSV23) vaccine.** / 1 to 2 doses if you smoke cigarettes or if you have certain conditions.  Meningococcal vaccine.** / 1 dose if you are age 71 to 69 years and a Market researcher living in a residence hall, or have one of several medical conditions. You may also need additional booster doses.  Hepatitis A vaccine.** / Consult your health care provider.  Hepatitis B vaccine.** / Consult your health care provider.  Haemophilus influenzae type b (Hib) vaccine.** / Consult your health care provider. Ages 15 to 13  Blood pressure check.** / Every 1 to 2 years.  Lipid and cholesterol check.** / Every 5 years beginning at age 42.  Lung cancer screening. / Every year if you are aged 70-80 years and have a 30-pack-year history of smoking and currently smoke or have quit within the past 15 years. Yearly screening is stopped once you have quit smoking for at least 15 years or develop a health problem that would prevent you from having lung cancer treatment.  Fecal occult blood test (FOBT) of stool. / Every year beginning at age 69 and continuing until age 65.  You may not have to do this test if you get a colonoscopy every 10 years.  Flexible sigmoidoscopy** or colonoscopy.** / Every 5 years for a flexible sigmoidoscopy or every 10 years for a  colonoscopy beginning at age 25 and continuing until age 3.  Hepatitis C blood test.** / For all people born from [birth year] through 1965 and any individual with known risks for hepatitis C.  Skin self-exam. / Monthly.  Influenza vaccine. / Every year.  Tetanus, diphtheria, and acellular pertussis (Tdap/Td) vaccine.** / Consult your health care provider. 1 dose of Td every 10 years.  Varicella vaccine.** / Consult your health care provider.  Zoster vaccine.** / 1 dose for adults aged 38 years or older.  Measles, mumps, rubella (MMR) vaccine.** / You need at least 1 dose of MMR if you were born in 1957 or later. You may also need a second dose.  Pneumococcal 13-valent conjugate (PCV13) vaccine.** / Consult your health care provider.  Pneumococcal polysaccharide (PPSV23) vaccine.** / 1 to 2 doses if you smoke cigarettes or if you have certain conditions.  Meningococcal vaccine.** / Consult your health care provider.  Hepatitis A vaccine.** / Consult your health care provider.  Hepatitis B vaccine.** / Consult your health care provider.  Haemophilus influenzae type b (Hib) vaccine.** / Consult your health care provider. Ages 37 and over  Blood pressure check.** / Every 1 to 2 years.  Lipid and cholesterol check.**/ Every 5 years beginning at age 74.  Lung cancer screening. / Every year if you are aged 76-80 years and have a 30-pack-year history of smoking and currently smoke or have quit within the past 15 years. Yearly screening is stopped once you have quit smoking for at least 15 years or develop a health problem that would prevent you from having lung cancer treatment.  Fecal occult blood test (FOBT) of stool. / Every year beginning at age 40 and continuing until age 69. You may not have to do this  test if you get a colonoscopy every 10 years.  Flexible sigmoidoscopy** or colonoscopy.** / Every 5 years for a flexible sigmoidoscopy or every 10 years for a colonoscopy beginning at age 10 and continuing until age 20.  Hepatitis C blood test.** / For all people born from [birth year] through 1965 and any individual with known risks for hepatitis C.  Abdominal aortic aneurysm (AAA) screening.** / A one-time screening for ages 74 to 66 years who are current or former smokers.  Skin self-exam. / Monthly.  Influenza vaccine. / Every year.  Tetanus, diphtheria, and acellular pertussis (Tdap/Td) vaccine.** / 1 dose of Td every 10 years.  Varicella vaccine.** / Consult your health care provider.  Zoster vaccine.** / 1 dose for adults aged 66 years or older.  Pneumococcal 13-valent conjugate (PCV13) vaccine.** / Consult your health care provider.  Pneumococcal polysaccharide (PPSV23) vaccine.** / 1 dose for all adults aged 35 years and older.  Meningococcal vaccine.** / Consult your health care provider.  Hepatitis A vaccine.** / Consult your health care provider.  Hepatitis B vaccine.** / Consult your health care provider.  Haemophilus influenzae type b (Hib) vaccine.** / Consult your health care provider. **Family history and personal history of risk and conditions may change your health care provider's recommendations. Document Released: 12/16/2001 Document Revised: 10/25/2013 Document Reviewed: 03/17/2011 [facility] Patient Information 2015 [place], [place]. This information is not intended to replace advice given to you by your health care provider. Make sure you discuss any questions you have with your health care provider.

## 2015-01-31 NOTE — Progress Notes (Signed)
Pre visit review using our clinic review tool, if applicable. No additional management support is needed unless otherwise documented below in the visit note/SLS  

## 2015-02-02 ENCOUNTER — Telehealth: Payer: Self-pay | Admitting: Physician Assistant

## 2015-02-02 DIAGNOSIS — E559 Vitamin D deficiency, unspecified: Secondary | ICD-10-CM

## 2015-02-02 MED ORDER — VITAMIN D (ERGOCALCIFEROL) 1.25 MG (50000 UNIT) PO CAPS: 50000.0000 [IU] | ORAL_CAPSULE | ORAL | Status: DC

## 2015-02-02 NOTE — Telephone Encounter (Signed)
Patient informed of lab results and instructions.  The patient did verbalize understanding and agreement of all instructions.  Sent prescription in as instructed, scheduled to repeat Vitamin D in the lab on 04/04/15 and put order in.

## 2015-02-02 NOTE — Telephone Encounter (Signed)
Labs look good overall. Cholesterol is mildly elevated. Encourage limited intake of foods high in saturated fats and cholesterol. Increase aerobic exercise. We'll continue to monitor yearly. If levels continue to increase, we will need to start a prescription medication.  His vitamin D level is low, which can contribute to low energy levels. I recommend we start treating. If patient is willing, okay to send a prescription for ergocalciferol 50,000 unit tablets --take 1 tablet by mouth every 7 days. Quantity 10 with no refills. Diagnosis vitamin D deficiency. We'll recheck vitamin D level in 2 months.

## 2015-02-04 DIAGNOSIS — Z0000 Encounter for general adult medical examination without abnormal findings: Secondary | ICD-10-CM | POA: Insufficient documentation

## 2015-02-04 DIAGNOSIS — S58112A Complete traumatic amputation at level between elbow and wrist, left arm, initial encounter: Secondary | ICD-10-CM | POA: Insufficient documentation

## 2015-02-04 NOTE — Progress Notes (Signed)
Patient presents to clinic today for annual exam with fasting labs.  Chronic medical issues discussed at last visit 1 week ago.  Patient endorses up-to-date on dental and optometry visits.  Will be getting Tetanus today. Declines flu shot.  Past Medical History  Diagnosis Date  . Chronic back pain     Neurostimulator  . Seasonal allergic conjunctivitis   . Environmental allergies   . Neuropathy     Past Surgical History  Procedure Laterality Date  . Hand amputation  2007  . Carpal tunnel release      Bilateral  . Rotator cuff repair      Left    Current Outpatient Prescriptions on File Prior to Visit  Medication Sig Dispense Refill  . Multiple Vitamins-Minerals (HM MULTIVITAMIN ADULT GUMMY) CHEW Chew 2 each by mouth daily.    . Oxycodone HCl 10 MG TABS Take 1 tablet (10 mg total) by mouth every 6 (six) hours. 120 tablet 0  . promethazine (PHENERGAN) 25 MG tablet Take 1 tablet (25 mg total) by mouth every 6 (six) hours as needed for nausea or vomiting. 12 tablet 1   No current facility-administered medications on file prior to visit.    Allergies  Allergen Reactions  . Aspirin Swelling  . Hydrocodone Itching    Family History  Problem Relation Age of Onset  . Diabetes Mother     Living  . Diabetes Father 50    Deceased  . Sleep apnea Mother   . Hypertension Father   . Jaundice Father   . Hemophilia Father   . Alcoholism Father   . Cancer Maternal Grandfather     Throat  . Cancer Paternal Uncle     Throat  . Asthma Sister   . Diabetes Sister   . Healthy Son     X1  . Healthy Daughter     X1    History   Social History  . Marital Status: Divorced    Spouse Name: N/A  . Number of Children: N/A  . Years of Education: N/A   Occupational History  . Not on file.   Social History Main Topics  . Smoking status: Never Smoker   . Smokeless tobacco: Never Used  . Alcohol Use: 0.0 oz/week    0 Standard drinks or equivalent per week  . Drug Use: No  .  Sexual Activity: Not on file   Other Topics Concern  . Not on file   Social History Narrative   Review of Systems  Constitutional: Negative for fever and weight loss.  HENT: Negative for ear discharge, ear pain, hearing loss and tinnitus.   Eyes: Negative for blurred vision, double vision, photophobia and pain.  Respiratory: Negative for cough and shortness of breath.   Cardiovascular: Negative for chest pain and palpitations.  Gastrointestinal: Negative for heartburn, nausea, vomiting, abdominal pain, diarrhea, constipation, blood in stool and melena.  Genitourinary: Negative for dysuria, urgency, frequency, hematuria and flank pain.  Musculoskeletal: Negative for falls.  Neurological: Negative for dizziness, loss of consciousness and headaches.  Endo/Heme/Allergies: Negative for environmental allergies.  Psychiatric/Behavioral: Negative for depression, suicidal ideas, hallucinations and substance abuse. The patient is not nervous/anxious and does not have insomnia.    BP 144/89 mmHg  Pulse 80  Temp(Src) 97.8 F (36.6 C) (Oral)  Resp 16  Ht 5\' 7"  (1.702 m)  Wt 242 lb (109.77 kg)  BMI 37.89 kg/m2  SpO2 94%  Physical Exam  Constitutional: He is oriented to person, place,  and time and well-developed, well-nourished, and in no distress.  HENT:  Head: Normocephalic and atraumatic.  Right Ear: External ear normal.  Left Ear: External ear normal.  Nose: Nose normal.  Mouth/Throat: Oropharynx is clear and moist. No oropharyngeal exudate.  Eyes: Conjunctivae and EOM are normal. Pupils are equal, round, and reactive to light.  Neck: Neck supple. No thyromegaly present.  Cardiovascular: Normal rate, regular rhythm, normal heart sounds and intact distal pulses.   Pulmonary/Chest: Effort normal and breath sounds normal. No respiratory distress. He has no wheezes. He has no rales. He exhibits no tenderness.  Abdominal: Soft. Bowel sounds are normal. He exhibits no distension and no mass.  There is no tenderness. There is no rebound and no guarding.  Genitourinary: Testes/scrotum normal.  Lymphadenopathy:    He has no cervical adenopathy.  Neurological: He is alert and oriented to person, place, and time.  Skin: Skin is warm and dry. No rash noted.  Psychiatric: Affect normal.  Vitals reviewed.   Recent Results (from the past 2160 hour(s))  Urinalysis, Routine w reflex microscopic     Status: Abnormal   Collection Time: 01/11/15  3:10 PM  Result Value Ref Range   Color, Urine YELLOW YELLOW   APPearance CLEAR CLEAR   Specific Gravity, Urine 1.019 1.005 - 1.030   pH 5.0 5.0 - 8.0   Glucose, UA NEGATIVE NEGATIVE mg/dL   Hgb urine dipstick NEGATIVE NEGATIVE   Bilirubin Urine NEGATIVE NEGATIVE   Ketones, ur 40 (A) NEGATIVE mg/dL   Protein, ur NEGATIVE NEGATIVE mg/dL   Urobilinogen, UA 0.2 0.0 - 1.0 mg/dL   Nitrite NEGATIVE NEGATIVE   Leukocytes, UA NEGATIVE NEGATIVE    Comment: MICROSCOPIC NOT DONE ON URINES WITH NEGATIVE PROTEIN, BLOOD, LEUKOCYTES, NITRITE, OR GLUCOSE <1000 mg/dL.  CBC with Differential/Platelet     Status: Abnormal   Collection Time: 01/11/15  4:40 PM  Result Value Ref Range   WBC 10.5 4.0 - 10.5 K/uL   RBC 4.64 4.22 - 5.81 MIL/uL   Hemoglobin 15.0 13.0 - 17.0 g/dL   HCT 43.0 39.0 - 52.0 %   MCV 92.7 78.0 - 100.0 fL   MCH 32.3 26.0 - 34.0 pg   MCHC 34.9 30.0 - 36.0 g/dL   RDW 12.7 11.5 - 15.5 %   Platelets 205 150 - 400 K/uL   Neutrophils Relative % 79 (H) 43 - 77 %   Neutro Abs 8.2 (H) 1.7 - 7.7 K/uL   Lymphocytes Relative 16 12 - 46 %   Lymphs Abs 1.7 0.7 - 4.0 K/uL   Monocytes Relative 5 3 - 12 %   Monocytes Absolute 0.6 0.1 - 1.0 K/uL   Eosinophils Relative 0 0 - 5 %   Eosinophils Absolute 0.0 0.0 - 0.7 K/uL   Basophils Relative 0 0 - 1 %   Basophils Absolute 0.0 0.0 - 0.1 K/uL  CBC     Status: None   Collection Time: 01/31/15  8:47 AM  Result Value Ref Range   WBC 8.0 4.0 - 10.5 K/uL   RBC 4.67 4.22 - 5.81 Mil/uL   Platelets  177.0 150.0 - 400.0 K/uL   Hemoglobin 15.2 13.0 - 17.0 g/dL   HCT 43.5 39.0 - 52.0 %   MCV 93.0 78.0 - 100.0 fl   MCHC 34.9 30.0 - 36.0 g/dL   RDW 13.1 11.5 - 96.7 %  Basic Metabolic Panel (BMET)     Status: Abnormal   Collection Time: 01/31/15  8:47 AM  Result  Value Ref Range   Sodium 135 135 - 145 mEq/L   Potassium 4.1 3.5 - 5.1 mEq/L   Chloride 105 96 - 112 mEq/L   CO2 25 19 - 32 mEq/L   Glucose, Bld 104 (H) 70 - 99 mg/dL   BUN 13 6 - 23 mg/dL   Creatinine, Ser 0.95 0.40 - 1.50 mg/dL   Calcium 9.4 8.4 - 10.5 mg/dL   GFR 90.00 >60.00 mL/min  Hepatic function panel     Status: Abnormal   Collection Time: 01/31/15  8:47 AM  Result Value Ref Range   Total Bilirubin 0.3 0.2 - 1.2 mg/dL   Bilirubin, Direct 0.1 0.0 - 0.3 mg/dL   Alkaline Phosphatase 93 39 - 117 U/L   AST 40 (H) 0 - 37 U/L   ALT 47 0 - 53 U/L   Total Protein 7.4 6.0 - 8.3 g/dL   Albumin 4.2 3.5 - 5.2 g/dL  TSH     Status: None   Collection Time: 01/31/15  8:47 AM  Result Value Ref Range   TSH 2.28 0.35 - 4.50 uIU/mL  Hemoglobin A1c     Status: None   Collection Time: 01/31/15  8:47 AM  Result Value Ref Range   Hgb A1c MFr Bld 5.7 4.6 - 6.5 %    Comment: Glycemic Control Guidelines for People with Diabetes:Non Diabetic:  <6%Goal of Therapy: <7%Additional Action Suggested:  >8%   Urinalysis, Routine w reflex microscopic     Status: Abnormal   Collection Time: 01/31/15  8:47 AM  Result Value Ref Range   Color, Urine YELLOW Yellow;Lt. Yellow   APPearance CLEAR Clear   Specific Gravity, Urine >=1.030 (A) 1.000 - 1.030   pH 5.0 5.0 - 8.0   Total Protein, Urine NEGATIVE Negative   Urine Glucose NEGATIVE Negative   Ketones, ur NEGATIVE Negative   Bilirubin Urine NEGATIVE Negative   Hgb urine dipstick TRACE-INTACT (A) Negative   Urobilinogen, UA 0.2 0.0 - 1.0   Leukocytes, UA NEGATIVE Negative   Nitrite NEGATIVE Negative   WBC, UA 0-2/hpf 0-2/hpf   RBC / HPF 0-2/hpf 0-2/hpf   Squamous Epithelial / LPF  Rare(0-4/hpf) Rare(0-4/hpf)   Uric Acid Crys, UA Presence of (A) None  Lipid Profile     Status: Abnormal   Collection Time: 01/31/15  8:47 AM  Result Value Ref Range   Cholesterol 208 (H) 0 - 200 mg/dL    Comment: ATP III Classification       Desirable:  < 200 mg/dL               Borderline High:  200 - 239 mg/dL          High:  > = 240 mg/dL   Triglycerides 198.0 (H) 0.0 - 149.0 mg/dL    Comment: Normal:  <150 mg/dLBorderline High:  150 - 199 mg/dL   HDL 49.70 >39.00 mg/dL   VLDL 39.6 0.0 - 40.0 mg/dL   LDL Cholesterol 119 (H) 0 - 99 mg/dL   Total CHOL/HDL Ratio 4     Comment:                Men          Women1/2 Average Risk     3.4          3.3Average Risk          5.0          4.42X Average Risk  9.6          7.13X Average Risk          15.0          11.0                       NonHDL 158.30     Comment: NOTE:  Non-HDL goal should be 30 mg/dL higher than patient's LDL goal (i.e. LDL goal of < 70 mg/dL, would have non-HDL goal of < 100 mg/dL)  Vitamin D (25 hydroxy)     Status: Abnormal   Collection Time: 01/31/15  8:47 AM  Result Value Ref Range   VITD 18.45 (L) 30.00 - 100.00 ng/mL    Assessment/Plan: Visit for preventive health examination I have reviewed the patient's medical history in detail and updated the computerized patient record.  Required immunizations are up-to-date.PHQ-2 Depression screen performed with score of 0. Will obtain fasting labs today.  Preventive care discussed with patient.  Handout given.

## 2015-02-04 NOTE — Assessment & Plan Note (Addendum)
I have reviewed the patient's medical history in detail and updated the computerized patient record.  Required immunizations are up-to-date.PHQ-2 Depression screen performed with score of 0. Will obtain fasting labs today.  Preventive care discussed with patient.  Handout given.

## 2015-02-16 ENCOUNTER — Telehealth: Payer: Self-pay | Admitting: Physician Assistant

## 2015-02-16 DIAGNOSIS — G8929 Other chronic pain: Secondary | ICD-10-CM

## 2015-02-16 DIAGNOSIS — M549 Dorsalgia, unspecified: Principal | ICD-10-CM

## 2015-02-16 NOTE — Telephone Encounter (Signed)
Caller name:Drew, Edrik Relation to HE:NIDP Call back number:[phone] Pharmacy:  Reason for call: pt is needing rx Oxycodone HCl 10 MG TABS please call when available for pick up.

## 2015-02-16 NOTE — Telephone Encounter (Signed)
Not due until next week -- 02/22/15.

## 2015-02-19 MED ORDER — OXYCODONE HCL 10 MG PO TABS: 10.0000 mg | ORAL_TABLET | Freq: Four times a day (QID) | ORAL | Status: DC

## 2015-02-19 NOTE — Telephone Encounter (Signed)
Left detailed message informing patient of this. °

## 2015-02-19 NOTE — Telephone Encounter (Signed)
Relation to pt: self  Call back number: [phone]   Reason for call:  Pt would like to pick up RX Wednesday 02/21/15 and is aware he can not fill until 02/22/15. pt also states he's daughter is having a baby and will be out of town for 6 weeks starting 02/24/15 and would like to know what will happen next month regarding receiving he's medication. Please advise

## 2015-02-19 NOTE — Telephone Encounter (Signed)
Will give prescriptions for 2 months -- they have fill date on them so the pharmacy will know when medication can be filled.  This should cover him until he returns.  Rx are at front desk in envelope with his name.

## 2015-03-19 ENCOUNTER — Emergency Department (HOSPITAL_BASED_OUTPATIENT_CLINIC_OR_DEPARTMENT_OTHER)
Admission: EM | Admit: 2015-03-19 | Discharge: 2015-03-19 | Disposition: A | Discharge status: Home / Self Care | Payer: Medicare PPO | Attending: Emergency Medicine | Admitting: Emergency Medicine

## 2015-03-19 ENCOUNTER — Encounter (HOSPITAL_BASED_OUTPATIENT_CLINIC_OR_DEPARTMENT_OTHER): Payer: Self-pay | Admitting: Emergency Medicine

## 2015-03-19 ENCOUNTER — Emergency Department (HOSPITAL_BASED_OUTPATIENT_CLINIC_OR_DEPARTMENT_OTHER): Payer: Medicare PPO

## 2015-03-19 DIAGNOSIS — M5431 Sciatica, right side: Secondary | ICD-10-CM | POA: Insufficient documentation

## 2015-03-19 DIAGNOSIS — Z79899 Other long term (current) drug therapy: Secondary | ICD-10-CM | POA: Diagnosis not present

## 2015-03-19 DIAGNOSIS — M791 Myalgia: Secondary | ICD-10-CM | POA: Insufficient documentation

## 2015-03-19 DIAGNOSIS — M549 Dorsalgia, unspecified: Secondary | ICD-10-CM | POA: Diagnosis present

## 2015-03-19 DIAGNOSIS — G629 Polyneuropathy, unspecified: Secondary | ICD-10-CM | POA: Diagnosis not present

## 2015-03-19 DIAGNOSIS — G8929 Other chronic pain: Secondary | ICD-10-CM | POA: Diagnosis not present

## 2015-03-19 DIAGNOSIS — Z7951 Long term (current) use of inhaled steroids: Secondary | ICD-10-CM | POA: Diagnosis not present

## 2015-03-19 DIAGNOSIS — R269 Unspecified abnormalities of gait and mobility: Secondary | ICD-10-CM | POA: Diagnosis not present

## 2015-03-19 MED ORDER — METHOCARBAMOL 500 MG PO TABS: 500.0000 mg | ORAL_TABLET | Freq: Two times a day (BID) | ORAL | Status: DC

## 2015-03-19 MED ORDER — MORPHINE SULFATE 4 MG/ML IJ SOLN
4.0000 mg | Freq: Once | INTRAMUSCULAR | Status: AC
  Administered 2015-03-19: 4 mg via INTRAMUSCULAR
  Filled 2015-03-19: qty 1

## 2015-03-19 MED ORDER — METHOCARBAMOL 500 MG PO TABS
500.0000 mg | ORAL_TABLET | Freq: Once | ORAL | Status: AC
  Administered 2015-03-19: 500 mg via ORAL
  Filled 2015-03-19: qty 1

## 2015-03-19 NOTE — ED Notes (Signed)
Patient transported to & from xray.

## 2015-03-19 NOTE — ED Provider Notes (Signed)
CSN: [id number]     Arrival date & time 03/19/15  1410 History   First MD Initiated Contact with Patient 03/19/15 1446     Chief Complaint  Patient presents with  . Back Pain     (Consider location/radiation/quality/duration/timing/severity/associated sxs/prior Treatment) HPI  Pt is a 49yo male with hx of chronic low back pain, had a neurostimulator placed about 1 year ago, presenting to ED with c/o exacerbation with low back pain for 2 days.  Pt states he leaned over to feed chickens 2 days ago, when he stood back up he heard clicking and popping. Pain has been constant since, aching sore, cramping, pressure in Right lower back and buttock, 10/10, worse with movement and ambulation. Pt uses cane for assistance. Denies change in bowel or bladder incontinence.  Denies fever, n/v/d. Denies hx of cancer or IVDU. Pt is followed by Raiford Noble, PA-C for primary care.  PCP also currently treating pt's chronic pain with percocet.  Referrals for Pain Management and Neurosurgery placed 01/23/15.  Pt states he has yet to hear back from either clinic. Pt does not recall name of providers or clinics. He has not tried to call clinics/providers directly.    Past Medical History  Diagnosis Date  . Chronic back pain     Neurostimulator  . Seasonal allergic conjunctivitis   . Environmental allergies   . Neuropathy    Past Surgical History  Procedure Laterality Date  . Hand amputation  2007  . Carpal tunnel release      Bilateral  . Rotator cuff repair      Left   Family History  Problem Relation Age of Onset  . Diabetes Mother     Living  . Diabetes Father 31    Deceased  . Sleep apnea Mother   . Hypertension Father   . Jaundice Father   . Hemophilia Father   . Alcoholism Father   . Cancer Maternal Grandfather     Throat  . Cancer Paternal Uncle     Throat  . Asthma Sister   . Diabetes Sister   . Healthy Son     X1  . Healthy Daughter     X1   History  Substance Use Topics  .  Smoking status: Never Smoker   . Smokeless tobacco: Never Used  . Alcohol Use: 0.0 oz/week    0 Standard drinks or equivalent per week    Review of Systems  Constitutional: Negative for fever, chills and fatigue.  Genitourinary: Negative for dysuria, frequency, hematuria and flank pain.  Musculoskeletal: Positive for myalgias, back pain, arthralgias and gait problem. Negative for joint swelling, neck pain and neck stiffness.  Skin: Negative for rash and wound.  Neurological: Positive for numbness. Negative for weakness.  All other systems reviewed and are negative.     Allergies  Aspirin and Hydrocodone  Home Medications   Prior to Admission medications   Medication Sig Start Date End Date Taking? Authorizing Provider  albuterol (ACCUNEB) 1.25 MG/3ML nebulizer solution Take 3 mLs (1.25 mg total) by nebulization every 6 (six) hours as needed for wheezing. 01/31/15   Brunetta Jeans, PA-C  albuterol (PROVENTIL HFA;VENTOLIN HFA) 108 (90 BASE) MCG/ACT inhaler Inhale 1-2 puffs into the lungs every 6 (six) hours as needed for wheezing or shortness of breath. 01/31/15   Brunetta Jeans, PA-C  clotrimazole-betamethasone (LOTRISONE) cream Apply 1 application topically 2 (two) times daily. 01/31/15   Brunetta Jeans, PA-C  fluticasone-salmeterol ([place]) [phone] MCG/ACT  inhaler Inhale 2 puffs into the lungs 2 (two) times daily. 01/31/15   Brunetta Jeans, PA-C  gabapentin (NEURONTIN) 300 MG capsule Take 2 capsules (600 mg total) by mouth 3 (three) times daily. 01/31/15   Brunetta Jeans, PA-C  methocarbamol (ROBAXIN) 500 MG tablet Take 1 tablet (500 mg total) by mouth 2 (two) times daily. 03/19/15   Noland Fordyce, PA-C  Multiple Vitamins-Minerals (HM MULTIVITAMIN ADULT GUMMY) CHEW Chew 2 each by mouth daily.    Historical Provider, MD  Oxycodone HCl 10 MG TABS Take 1 tablet (10 mg total) by mouth every 6 (six) hours. 02/19/15   Brunetta Jeans, PA-C  promethazine (PHENERGAN) 25 MG tablet Take  1 tablet (25 mg total) by mouth every 6 (six) hours as needed for nausea or vomiting. 01/11/15   Fredia Sorrow, MD  Vitamin D, Ergocalciferol, (DRISDOL) 50000 UNITS CAPS capsule Take 1 capsule (50,000 Units total) by mouth every 7 (seven) days. 02/02/15   Brunetta Jeans, PA-C   BP 149/93 mmHg  Pulse 74  Temp(Src) 98.7 F (37.1 C)  Resp 18  SpO2 99% Physical Exam  Constitutional: He is oriented to person, place, and time. He appears well-developed and well-nourished.  HENT:  Head: Normocephalic and atraumatic.  Eyes: EOM are normal.  Neck: Normal range of motion.  Cardiovascular: Normal rate.   Pulmonary/Chest: Effort normal.  Musculoskeletal: Normal range of motion. He exhibits tenderness. He exhibits no edema.  L5-sacral midline spinal tenderness and tenderness to Right buttock. Palpable nerve stimulator.   Left forearm surgically missing. FROM bilateral upper arms. FROM bilateral legs with 5/5 strength. Positive straight leg raise on Right leg.     Neurological: He is alert and oriented to person, place, and time.  Antalgic gait with cane. Sensation symmetric in bilateral lower legs.   Skin: Skin is warm and dry.  Psychiatric: He has a normal mood and affect. His behavior is normal.  Nursing note and vitals reviewed.   ED Course  Procedures (including critical care time) Labs Review Labs Reviewed - No data to display  Imaging Review Dg Lumbar Spine Complete  03/19/2015   CLINICAL DATA:  Low back injury 1 week ago when standing.  EXAM: LUMBAR SPINE - COMPLETE 4+ VIEW  COMPARISON:  None.  FINDINGS: Vertebral body height and alignment are maintained with straightening of the normal lumbar lordosis noted. Intervertebral disc space height is normal. The facet joints are unremarkable. Spinal stimulator is in place with the tip at mid T8.  IMPRESSION: No acute or focal abnormality or notable degenerative change.   Electronically Signed   By: Inge Rise M.D.   On: 03/19/2015 15:48    Dg Sacrum/coccyx  03/19/2015   CLINICAL DATA:  49 year old male with lower back pain radiating into both legs. Pain began when he felt a 'crunch' while bending over 1 week previously.  EXAM: SACRUM AND COCCYX - 2+ VIEW  COMPARISON:  Concurrently obtained radiographs of the lumbar spine  FINDINGS: There is no evidence of fracture or other focal bone lesions. Incompletely imaged epidural spinal stimulator. The generator projects over the soft tissues of the right gluteal region.  IMPRESSION: Negative.   Electronically Signed   By: Jacqulynn Cadet M.D.   On: 03/19/2015 15:48     EKG Interpretation None      MDM   Final diagnoses:  Exacerbation of chronic back pain  Right sided sciatica   Pt with hx of chronic low back pain, presenting to ED with exacerbation  after bending injury.  No direct trauma.  Pt does ambulate with a cane. No red flag symptoms.  Plain films performed to ensure no movement of device.  Plain films: unremarkable.  Pt given IM morphine and PO robaxin in ED. Pt reports only minimal relief in pain.  Pt is able to ambulate with assistance of cane.  MRI is unavailable at this facility at this time. No red flag symptoms, no indication to have pt transferred to another facility for additional imaging. Encouraged pt to f/u with PCP to discuss pain management and neurosurgery referrals. Also provided pt with referral to Dr. Arnoldo Morale so pt can try to set up appointment sooner.  Pt will f/u with PCP after discharged from ED. Home care instructions provided. Rx: robaxin. Pt understands he cannot get additional percocet as that is currently prescribed monthly by his PCP. Return precautions provided. Pt verbalized understanding and agreement with tx plan.   Noland Fordyce, PA-C 03/19/15 1711  Carmin Muskrat, MD 03/20/15 [phone]

## 2015-03-19 NOTE — ED Notes (Signed)
Pt in c/o straining back x 2 days when leaning over to feed his chickens. Pt is able to ambulate despite pain.

## 2015-03-20 ENCOUNTER — Ambulatory Visit (INDEPENDENT_AMBULATORY_CARE_PROVIDER_SITE_OTHER): Payer: Medicare PPO | Admitting: Physician Assistant

## 2015-03-20 ENCOUNTER — Encounter: Payer: Self-pay | Admitting: Physician Assistant

## 2015-03-20 VITALS — BP 140/90 | HR 85 | Temp 97.8°F | Ht 67.0 in | Wt 240.5 lb

## 2015-03-20 DIAGNOSIS — G8929 Other chronic pain: Secondary | ICD-10-CM

## 2015-03-20 DIAGNOSIS — M549 Dorsalgia, unspecified: Secondary | ICD-10-CM | POA: Diagnosis not present

## 2015-03-20 MED ORDER — CYCLOBENZAPRINE HCL 10 MG PO TABS: 10.0000 mg | ORAL_TABLET | Freq: Three times a day (TID) | ORAL | Status: DC | PRN

## 2015-03-20 NOTE — Patient Instructions (Signed)
Please continue pain medications as directed. Start the Flexeril three times daily.  Do not drive while on this medication. Alternate heat and ice to the area. No heavy lifting or overexertion.  Rest. Call me in a couple of days and let me know how you are. Otherwise follow-up in 1 week.  [facility] -- [phone]

## 2015-03-20 NOTE — Progress Notes (Signed)
Patient presents to clinic today for ER follow-up regarding exacerbation of chronic back pain.  Patient states he was bending over to feed his chickens this weekend when her heard a faint pop and then lower back pain began. States pain became intense so he went to ER.  ER records reviewed. ER evaluation negative for emergent condition.  Was given pain med in ER and discharged with Rx for Robaxin.  Patient endorses Robaxin has not been helpful.  Still having pain although slightly better than a few days ago.  Is taking chronic Oxycodone as directed.  Denies numbness or tingling or lower extremities.  Pain does not radiate.  Past Medical History  Diagnosis Date  . Chronic back pain     Neurostimulator  . Seasonal allergic conjunctivitis   . Environmental allergies   . Neuropathy     Current Outpatient Prescriptions on File Prior to Visit  Medication Sig Dispense Refill  . albuterol (ACCUNEB) 1.25 MG/3ML nebulizer solution Take 3 mLs (1.25 mg total) by nebulization every 6 (six) hours as needed for wheezing. 75 mL 3  . clotrimazole-betamethasone (LOTRISONE) cream Apply 1 application topically 2 (two) times daily. 30 g 0  . gabapentin (NEURONTIN) 300 MG capsule Take 2 capsules (600 mg total) by mouth 3 (three) times daily. 540 capsule 1  . Multiple Vitamins-Minerals (HM MULTIVITAMIN ADULT GUMMY) CHEW Chew 2 each by mouth daily.    . Oxycodone HCl 10 MG TABS Take 1 tablet (10 mg total) by mouth every 6 (six) hours. 120 tablet 0  . promethazine (PHENERGAN) 25 MG tablet Take 1 tablet (25 mg total) by mouth every 6 (six) hours as needed for nausea or vomiting. 12 tablet 1  . Vitamin D, Ergocalciferol, (DRISDOL) 50000 UNITS CAPS capsule Take 1 capsule (50,000 Units total) by mouth every 7 (seven) days. 10 capsule 0  . albuterol (PROVENTIL HFA;VENTOLIN HFA) 108 (90 BASE) MCG/ACT inhaler Inhale 1-2 puffs into the lungs every 6 (six) hours as needed for wheezing or shortness of breath. (Patient not  taking: Reported on 03/20/2015) 1 Inhaler 1  . fluticasone-salmeterol (ADVAIR HFA) 230-21 MCG/ACT inhaler Inhale 2 puffs into the lungs 2 (two) times daily. (Patient not taking: Reported on 03/20/2015) 3 Inhaler 1   No current facility-administered medications on file prior to visit.    Allergies  Allergen Reactions  . Aspirin Swelling  . Hydrocodone Itching    Family History  Problem Relation Age of Onset  . Diabetes Mother     Living  . Diabetes Father 2    Deceased  . Sleep apnea Mother   . Hypertension Father   . Jaundice Father   . Hemophilia Father   . Alcoholism Father   . Cancer Maternal Grandfather     Throat  . Cancer Paternal Uncle     Throat  . Asthma Sister   . Diabetes Sister   . Healthy Son     X1  . Healthy Daughter     X1    History   Social History  . Marital Status: Divorced    Spouse Name: N/A  . Number of Children: N/A  . Years of Education: N/A   Social History Main Topics  . Smoking status: Never Smoker   . Smokeless tobacco: Never Used  . Alcohol Use: 0.0 oz/week    0 Standard drinks or equivalent per week  . Drug Use: No  . Sexual Activity: Not on file   Other Topics Concern  . None  Social History Narrative    Review of Systems - See HPI.  All other ROS are negative.  BP 140/90 mmHg  Pulse 85  Temp(Src) 97.8 F (36.6 C) (Oral)  Ht 5\' 7"  (1.702 m)  Wt 240 lb 8 oz (109.09 kg)  BMI 37.66 kg/m2  SpO2 96%  Physical Exam  Constitutional: He is oriented to person, place, and time and well-developed, well-nourished, and in no distress.  HENT:  Head: Normocephalic and atraumatic.  Cardiovascular: Normal rate, regular rhythm, normal heart sounds and intact distal pulses.   Pulmonary/Chest: Effort normal and breath sounds normal. No respiratory distress. He has no wheezes. He has no rales. He exhibits no tenderness.  Musculoskeletal:       Lumbar back: He exhibits tenderness, pain and spasm. He exhibits normal range of motion,  no bony tenderness, no swelling and no deformity.  Neurological: He is alert and oriented to person, place, and time.  Skin: Skin is warm and dry. No rash noted.  Psychiatric: Affect normal.  Vitals reviewed.   Recent Results (from the past 2160 hour(s))  Urinalysis, Routine w reflex microscopic     Status: Abnormal   Collection Time: 01/11/15  3:10 PM  Result Value Ref Range   Color, Urine YELLOW YELLOW   APPearance CLEAR CLEAR   Specific Gravity, Urine 1.019 1.005 - 1.030   pH 5.0 5.0 - 8.0   Glucose, UA NEGATIVE NEGATIVE mg/dL   Hgb urine dipstick NEGATIVE NEGATIVE   Bilirubin Urine NEGATIVE NEGATIVE   Ketones, ur 40 (A) NEGATIVE mg/dL   Protein, ur NEGATIVE NEGATIVE mg/dL   Urobilinogen, UA 0.2 0.0 - 1.0 mg/dL   Nitrite NEGATIVE NEGATIVE   Leukocytes, UA NEGATIVE NEGATIVE    Comment: MICROSCOPIC NOT DONE ON URINES WITH NEGATIVE PROTEIN, BLOOD, LEUKOCYTES, NITRITE, OR GLUCOSE <1000 mg/dL.  CBC with Differential/Platelet     Status: Abnormal   Collection Time: 01/11/15  4:40 PM  Result Value Ref Range   WBC 10.5 4.0 - 10.5 K/uL   RBC 4.64 4.22 - 5.81 MIL/uL   Hemoglobin 15.0 13.0 - 17.0 g/dL   HCT 43.0 39.0 - 52.0 %   MCV 92.7 78.0 - 100.0 fL   MCH 32.3 26.0 - 34.0 pg   MCHC 34.9 30.0 - 36.0 g/dL   RDW 12.7 11.5 - 15.5 %   Platelets 205 150 - 400 K/uL   Neutrophils Relative % 79 (H) 43 - 77 %   Neutro Abs 8.2 (H) 1.7 - 7.7 K/uL   Lymphocytes Relative 16 12 - 46 %   Lymphs Abs 1.7 0.7 - 4.0 K/uL   Monocytes Relative 5 3 - 12 %   Monocytes Absolute 0.6 0.1 - 1.0 K/uL   Eosinophils Relative 0 0 - 5 %   Eosinophils Absolute 0.0 0.0 - 0.7 K/uL   Basophils Relative 0 0 - 1 %   Basophils Absolute 0.0 0.0 - 0.1 K/uL  CBC     Status: None   Collection Time: 01/31/15  8:47 AM  Result Value Ref Range   WBC 8.0 4.0 - 10.5 K/uL   RBC 4.67 4.22 - 5.81 Mil/uL   Platelets 177.0 150.0 - 400.0 K/uL   Hemoglobin 15.2 13.0 - 17.0 g/dL   HCT 43.5 39.0 - 52.0 %   MCV 93.0 78.0 -  100.0 fl   MCHC 34.9 30.0 - 36.0 g/dL   RDW 13.1 11.5 - 52.7 %  Basic Metabolic Panel (BMET)     Status: Abnormal   Collection  Time: 01/31/15  8:47 AM  Result Value Ref Range   Sodium 135 135 - 145 mEq/L   Potassium 4.1 3.5 - 5.1 mEq/L   Chloride 105 96 - 112 mEq/L   CO2 25 19 - 32 mEq/L   Glucose, Bld 104 (H) 70 - 99 mg/dL   BUN 13 6 - 23 mg/dL   Creatinine, Ser 0.95 0.40 - 1.50 mg/dL   Calcium 9.4 8.4 - 10.5 mg/dL   GFR 90.00 >60.00 mL/min  Hepatic function panel     Status: Abnormal   Collection Time: 01/31/15  8:47 AM  Result Value Ref Range   Total Bilirubin 0.3 0.2 - 1.2 mg/dL   Bilirubin, Direct 0.1 0.0 - 0.3 mg/dL   Alkaline Phosphatase 93 39 - 117 U/L   AST 40 (H) 0 - 37 U/L   ALT 47 0 - 53 U/L   Total Protein 7.4 6.0 - 8.3 g/dL   Albumin 4.2 3.5 - 5.2 g/dL  TSH     Status: None   Collection Time: 01/31/15  8:47 AM  Result Value Ref Range   TSH 2.28 0.35 - 4.50 uIU/mL  Hemoglobin A1c     Status: None   Collection Time: 01/31/15  8:47 AM  Result Value Ref Range   Hgb A1c MFr Bld 5.7 4.6 - 6.5 %    Comment: Glycemic Control Guidelines for People with Diabetes:Non Diabetic:  <6%Goal of Therapy: <7%Additional Action Suggested:  >8%   Urinalysis, Routine w reflex microscopic     Status: Abnormal   Collection Time: 01/31/15  8:47 AM  Result Value Ref Range   Color, Urine YELLOW Yellow;Lt. Yellow   APPearance CLEAR Clear   Specific Gravity, Urine >=1.030 (A) 1.000 - 1.030   pH 5.0 5.0 - 8.0   Total Protein, Urine NEGATIVE Negative   Urine Glucose NEGATIVE Negative   Ketones, ur NEGATIVE Negative   Bilirubin Urine NEGATIVE Negative   Hgb urine dipstick TRACE-INTACT (A) Negative   Urobilinogen, UA 0.2 0.0 - 1.0   Leukocytes, UA NEGATIVE Negative   Nitrite NEGATIVE Negative   WBC, UA 0-2/hpf 0-2/hpf   RBC / HPF 0-2/hpf 0-2/hpf   Squamous Epithelial / LPF Rare(0-4/hpf) Rare(0-4/hpf)   Uric Acid Crys, UA Presence of (A) None  Lipid Profile     Status: Abnormal    Collection Time: 01/31/15  8:47 AM  Result Value Ref Range   Cholesterol 208 (H) 0 - 200 mg/dL    Comment: ATP III Classification       Desirable:  < 200 mg/dL               Borderline High:  200 - 239 mg/dL          High:  > = 240 mg/dL   Triglycerides 198.0 (H) 0.0 - 149.0 mg/dL    Comment: Normal:  <150 mg/dLBorderline High:  150 - 199 mg/dL   HDL 49.70 >39.00 mg/dL   VLDL 39.6 0.0 - 40.0 mg/dL   LDL Cholesterol 119 (H) 0 - 99 mg/dL   Total CHOL/HDL Ratio 4     Comment:                Men          Women1/2 Average Risk     3.4          3.3Average Risk          5.0          4.42X Average Risk  9.6          7.13X Average Risk          15.0          11.0                       NonHDL 158.30     Comment: NOTE:  Non-HDL goal should be 30 mg/dL higher than patient's LDL goal (i.e. LDL goal of < 70 mg/dL, would have non-HDL goal of < 100 mg/dL)  Vitamin D (25 hydroxy)     Status: Abnormal   Collection Time: 01/31/15  8:47 AM  Result Value Ref Range   VITD 18.45 (L) 30.00 - 100.00 ng/mL    Assessment/Plan: Exacerbation of chronic back pain Will take some time to completely resolve. Is improving slightly.  Will continue same dose of chronic pain medication.  Robaxin discontinued. Rx Flexeril 10 mg TID. Alternate heat and ice.  No heavy lifting. Patient instructed to schedule appointment with Neurosurgery for chronic pain.

## 2015-03-20 NOTE — Assessment & Plan Note (Signed)
Will take some time to completely resolve. Is improving slightly.  Will continue same dose of chronic pain medication.  Robaxin discontinued. Rx Flexeril 10 mg TID. Alternate heat and ice.  No heavy lifting. Patient instructed to schedule appointment with Neurosurgery for chronic pain.

## 2015-03-20 NOTE — Progress Notes (Signed)
Pre visit review using our clinic review tool, if applicable. No additional management support is needed unless otherwise documented below in the visit note. 

## 2015-03-23 ENCOUNTER — Telehealth: Payer: Self-pay | Admitting: Physician Assistant

## 2015-03-23 NOTE — Telephone Encounter (Signed)
Informed Jennifer at [facility] of this.

## 2015-03-23 NOTE — Telephone Encounter (Signed)
Caller name: jennifer at [facility] Relation to pt: Call back number:  [phone] Pharmacy:  Reason for call:   Stating that patient is requesting to fill oxy a day early. Is this ok?

## 2015-03-23 NOTE — Telephone Encounter (Signed)
Ok to approve that per me

## 2015-03-23 NOTE — Telephone Encounter (Signed)
Caller name: Lea Relation to pt: self Call back number: [phone] Pharmacy:  Reason for call:   Patient states that he is doing better but is sore and is having a little problems getting around. Just FYI

## 2015-04-04 ENCOUNTER — Other Ambulatory Visit: Payer: Medicare PPO

## 2015-04-10 ENCOUNTER — Encounter: Payer: Self-pay | Admitting: Physical Medicine & Rehabilitation

## 2015-04-11 ENCOUNTER — Ambulatory Visit (INDEPENDENT_AMBULATORY_CARE_PROVIDER_SITE_OTHER): Payer: Medicare PPO | Admitting: Physician Assistant

## 2015-04-11 ENCOUNTER — Encounter: Payer: Self-pay | Admitting: Physician Assistant

## 2015-04-11 VITALS — BP 140/88 | HR 79 | Temp 98.0°F | Resp 16 | Ht 67.0 in | Wt 246.0 lb

## 2015-04-11 DIAGNOSIS — M549 Dorsalgia, unspecified: Secondary | ICD-10-CM | POA: Diagnosis not present

## 2015-04-11 DIAGNOSIS — E559 Vitamin D deficiency, unspecified: Secondary | ICD-10-CM | POA: Diagnosis not present

## 2015-04-11 DIAGNOSIS — G8929 Other chronic pain: Secondary | ICD-10-CM | POA: Diagnosis not present

## 2015-04-11 LAB — VITAMIN D 25 HYDROXY (VIT D DEFICIENCY, FRACTURES): VITD: 19.23 ng/mL — ABNORMAL LOW (ref 30.00–100.00)

## 2015-04-11 MED ORDER — ERGOCALCIFEROL 1.25 MG (50000 UT) PO CAPS: 50000.0000 [IU] | ORAL_CAPSULE | ORAL | Status: DC

## 2015-04-11 MED ORDER — TERBINAFINE HCL 250 MG PO TABS: 250.0000 mg | ORAL_TABLET | Freq: Every day | ORAL | Status: DC

## 2015-04-11 MED ORDER — OXYCODONE HCL 10 MG PO TABS: 10.0000 mg | ORAL_TABLET | ORAL | Status: DC

## 2015-04-11 NOTE — Assessment & Plan Note (Signed)
Increase Oxycodone to 1 tablet by mouth every 4 hours.  Continue supportive measures. Spoke with [facility] concerning referral who will check on status and call patient.  Follow-up with pain management as directed.

## 2015-04-11 NOTE — Patient Instructions (Signed)
I have sent in a prescription for an oral medication for your feet.  Take once daily as directed. Continue cream. Keep feet clean and dry.  I have printed your new dosing instructions and prescription for your pain medication.

## 2015-04-11 NOTE — Progress Notes (Signed)
Pre visit review using our clinic review tool, if applicable. No additional management support is needed unless otherwise documented below in the visit note/SLS  

## 2015-04-11 NOTE — Progress Notes (Signed)
Patient presents to clinic today to discuss pain management for chronic back pain.  States he has not heard back from neurology regarding referral placed 01/23/15. Is taking Oxycodone as directed with some relief of pain but states medication is not lasting long enough.  Patient has upcoming appointment with pain management in early august. Is trying to avoid heavy lifting or overexertion.  Denies new or worsening symptoms.  Past Medical History  Diagnosis Date  . Chronic back pain     Neurostimulator  . Seasonal allergic conjunctivitis   . Environmental allergies   . Neuropathy     Current Outpatient Prescriptions on File Prior to Visit  Medication Sig Dispense Refill  . albuterol (ACCUNEB) 1.25 MG/3ML nebulizer solution Take 3 mLs (1.25 mg total) by nebulization every 6 (six) hours as needed for wheezing. 75 mL 3  . albuterol (PROVENTIL HFA;VENTOLIN HFA) 108 (90 BASE) MCG/ACT inhaler Inhale 1-2 puffs into the lungs every 6 (six) hours as needed for wheezing or shortness of breath. 1 Inhaler 1  . cyclobenzaprine (FLEXERIL) 10 MG tablet Take 1 tablet (10 mg total) by mouth 3 (three) times daily as needed for muscle spasms. 30 tablet 0  . fluticasone-salmeterol (ADVAIR HFA) 230-21 MCG/ACT inhaler Inhale 2 puffs into the lungs 2 (two) times daily. 3 Inhaler 1  . gabapentin (NEURONTIN) 300 MG capsule Take 2 capsules (600 mg total) by mouth 3 (three) times daily. 540 capsule 1  . Multiple Vitamins-Minerals (HM MULTIVITAMIN ADULT GUMMY) CHEW Chew 2 each by mouth daily.    . promethazine (PHENERGAN) 25 MG tablet Take 1 tablet (25 mg total) by mouth every 6 (six) hours as needed for nausea or vomiting. 12 tablet 1   No current facility-administered medications on file prior to visit.    Allergies  Allergen Reactions  . Aspirin Swelling  . Hydrocodone Itching    Family History  Problem Relation Age of Onset  . Diabetes Mother     Living  . Diabetes Father 60    Deceased  . Sleep  apnea Mother   . Hypertension Father   . Jaundice Father   . Hemophilia Father   . Alcoholism Father   . Cancer Maternal Grandfather     Throat  . Cancer Paternal Uncle     Throat  . Asthma Sister   . Diabetes Sister   . Healthy Son     X1  . Healthy Daughter     X1    History   Social History  . Marital Status: Divorced    Spouse Name: N/A  . Number of Children: N/A  . Years of Education: N/A   Social History Main Topics  . Smoking status: Never Smoker   . Smokeless tobacco: Never Used  . Alcohol Use: 0.0 oz/week    0 Standard drinks or equivalent per week  . Drug Use: No  . Sexual Activity: Not on file   Other Topics Concern  . None   Social History Narrative    Review of Systems - See HPI.  All other ROS are negative.  BP 140/88 mmHg  Pulse 79  Temp(Src) 98 F (36.7 C) (Oral)  Resp 16  Ht 5\' 7"  (1.702 m)  Wt 246 lb (111.585 kg)  BMI 38.52 kg/m2  SpO2 97%  Physical Exam  Constitutional: He is well-developed, well-nourished, and in no distress.  HENT:  Head: Normocephalic and atraumatic.  Eyes: Conjunctivae are normal. Pupils are equal, round, and reactive to light.  Neck:  Neck supple.  Cardiovascular: Normal rate, regular rhythm, normal heart sounds and intact distal pulses.   Pulmonary/Chest: Effort normal and breath sounds normal. No respiratory distress. He has no wheezes. He has no rales. He exhibits no tenderness.  Musculoskeletal:       Lumbar back: He exhibits pain.  Lymphadenopathy:    He has no cervical adenopathy.  Neurological: He is alert.  Skin: Skin is warm and dry. No rash noted.  Psychiatric: Affect normal.  Vitals reviewed.  Recent Results (from the past 2160 hour(s))  Urinalysis, Routine w reflex microscopic     Status: Abnormal   Collection Time: 01/11/15  3:10 PM  Result Value Ref Range   Color, Urine YELLOW YELLOW   APPearance CLEAR CLEAR   Specific Gravity, Urine 1.019 1.005 - 1.030   pH 5.0 5.0 - 8.0   Glucose, UA  NEGATIVE NEGATIVE mg/dL   Hgb urine dipstick NEGATIVE NEGATIVE   Bilirubin Urine NEGATIVE NEGATIVE   Ketones, ur 40 (A) NEGATIVE mg/dL   Protein, ur NEGATIVE NEGATIVE mg/dL   Urobilinogen, UA 0.2 0.0 - 1.0 mg/dL   Nitrite NEGATIVE NEGATIVE   Leukocytes, UA NEGATIVE NEGATIVE    Comment: MICROSCOPIC NOT DONE ON URINES WITH NEGATIVE PROTEIN, BLOOD, LEUKOCYTES, NITRITE, OR GLUCOSE <1000 mg/dL.  CBC with Differential/Platelet     Status: Abnormal   Collection Time: 01/11/15  4:40 PM  Result Value Ref Range   WBC 10.5 4.0 - 10.5 K/uL   RBC 4.64 4.22 - 5.81 MIL/uL   Hemoglobin 15.0 13.0 - 17.0 g/dL   HCT 43.0 39.0 - 52.0 %   MCV 92.7 78.0 - 100.0 fL   MCH 32.3 26.0 - 34.0 pg   MCHC 34.9 30.0 - 36.0 g/dL   RDW 12.7 11.5 - 15.5 %   Platelets 205 150 - 400 K/uL   Neutrophils Relative % 79 (H) 43 - 77 %   Neutro Abs 8.2 (H) 1.7 - 7.7 K/uL   Lymphocytes Relative 16 12 - 46 %   Lymphs Abs 1.7 0.7 - 4.0 K/uL   Monocytes Relative 5 3 - 12 %   Monocytes Absolute 0.6 0.1 - 1.0 K/uL   Eosinophils Relative 0 0 - 5 %   Eosinophils Absolute 0.0 0.0 - 0.7 K/uL   Basophils Relative 0 0 - 1 %   Basophils Absolute 0.0 0.0 - 0.1 K/uL  CBC     Status: None   Collection Time: 01/31/15  8:47 AM  Result Value Ref Range   WBC 8.0 4.0 - 10.5 K/uL   RBC 4.67 4.22 - 5.81 Mil/uL   Platelets 177.0 150.0 - 400.0 K/uL   Hemoglobin 15.2 13.0 - 17.0 g/dL   HCT 43.5 39.0 - 52.0 %   MCV 93.0 78.0 - 100.0 fl   MCHC 34.9 30.0 - 36.0 g/dL   RDW 13.1 11.5 - 64.3 %  Basic Metabolic Panel (BMET)     Status: Abnormal   Collection Time: 01/31/15  8:47 AM  Result Value Ref Range   Sodium 135 135 - 145 mEq/L   Potassium 4.1 3.5 - 5.1 mEq/L   Chloride 105 96 - 112 mEq/L   CO2 25 19 - 32 mEq/L   Glucose, Bld 104 (H) 70 - 99 mg/dL   BUN 13 6 - 23 mg/dL   Creatinine, Ser 0.95 0.40 - 1.50 mg/dL   Calcium 9.4 8.4 - 10.5 mg/dL   GFR 90.00 >60.00 mL/min  Hepatic function panel     Status: Abnormal  Collection Time:  01/31/15  8:47 AM  Result Value Ref Range   Total Bilirubin 0.3 0.2 - 1.2 mg/dL   Bilirubin, Direct 0.1 0.0 - 0.3 mg/dL   Alkaline Phosphatase 93 39 - 117 U/L   AST 40 (H) 0 - 37 U/L   ALT 47 0 - 53 U/L   Total Protein 7.4 6.0 - 8.3 g/dL   Albumin 4.2 3.5 - 5.2 g/dL  TSH     Status: None   Collection Time: 01/31/15  8:47 AM  Result Value Ref Range   TSH 2.28 0.35 - 4.50 uIU/mL  Hemoglobin A1c     Status: None   Collection Time: 01/31/15  8:47 AM  Result Value Ref Range   Hgb A1c MFr Bld 5.7 4.6 - 6.5 %    Comment: Glycemic Control Guidelines for People with Diabetes:Non Diabetic:  <6%Goal of Therapy: <7%Additional Action Suggested:  >8%   Urinalysis, Routine w reflex microscopic     Status: Abnormal   Collection Time: 01/31/15  8:47 AM  Result Value Ref Range   Color, Urine YELLOW Yellow;Lt. Yellow   APPearance CLEAR Clear   Specific Gravity, Urine >=1.030 (A) 1.000 - 1.030   pH 5.0 5.0 - 8.0   Total Protein, Urine NEGATIVE Negative   Urine Glucose NEGATIVE Negative   Ketones, ur NEGATIVE Negative   Bilirubin Urine NEGATIVE Negative   Hgb urine dipstick TRACE-INTACT (A) Negative   Urobilinogen, UA 0.2 0.0 - 1.0   Leukocytes, UA NEGATIVE Negative   Nitrite NEGATIVE Negative   WBC, UA 0-2/hpf 0-2/hpf   RBC / HPF 0-2/hpf 0-2/hpf   Squamous Epithelial / LPF Rare(0-4/hpf) Rare(0-4/hpf)   Uric Acid Crys, UA Presence of (A) None  Lipid Profile     Status: Abnormal   Collection Time: 01/31/15  8:47 AM  Result Value Ref Range   Cholesterol 208 (H) 0 - 200 mg/dL    Comment: ATP III Classification       Desirable:  < 200 mg/dL               Borderline High:  200 - 239 mg/dL          High:  > = 240 mg/dL   Triglycerides 198.0 (H) 0.0 - 149.0 mg/dL    Comment: Normal:  <150 mg/dLBorderline High:  150 - 199 mg/dL   HDL 49.70 >39.00 mg/dL   VLDL 39.6 0.0 - 40.0 mg/dL   LDL Cholesterol 119 (H) 0 - 99 mg/dL   Total CHOL/HDL Ratio 4     Comment:                Men          Women1/2  Average Risk     3.4          3.3Average Risk          5.0          4.42X Average Risk          9.6          7.13X Average Risk          15.0          11.0                       NonHDL 158.30     Comment: NOTE:  Non-HDL goal should be 30 mg/dL higher than patient's LDL goal (i.e. LDL goal of < 70 mg/dL, would have non-HDL goal of <  100 mg/dL)  Vitamin D (25 hydroxy)     Status: Abnormal   Collection Time: 01/31/15  8:47 AM  Result Value Ref Range   VITD 18.45 (L) 30.00 - 100.00 ng/mL  Vitamin D (25 hydroxy)     Status: Abnormal   Collection Time: 04/11/15  8:19 AM  Result Value Ref Range   VITD 19.23 (L) 30.00 - 100.00 ng/mL    Assessment/Plan: Chronic back pain greater than 3 months duration Increase Oxycodone to 1 tablet by mouth every 4 hours.  Continue supportive measures. Spoke with [facility] concerning referral who will check on status and call patient.  Follow-up with pain management as directed.

## 2015-04-11 NOTE — Telephone Encounter (Signed)
Vitamin D level still low. RX ergocalciferol 50,000 units to take once weekly for the next 4 months. Want to recheck Vitamin D in 4 months.

## 2015-04-11 NOTE — Telephone Encounter (Signed)
Patient informed, understood & agreed/SLS  

## 2015-05-03 ENCOUNTER — Telehealth: Payer: Self-pay | Admitting: Physician Assistant

## 2015-05-03 DIAGNOSIS — G8929 Other chronic pain: Secondary | ICD-10-CM

## 2015-05-03 DIAGNOSIS — M549 Dorsalgia, unspecified: Principal | ICD-10-CM

## 2015-05-03 NOTE — Telephone Encounter (Signed)
Caller name: Akia Relation to pt: Call back number: [phone] Pharmacy:  Reason for call:   Patient is requesting percocet refill. Patient is leaving for out of town on 05/11/15 and will be gone for two months. He is wanting to know if he can have enough refills to last him until he comes back.

## 2015-05-04 MED ORDER — OXYCODONE HCL 10 MG PO TABS: 10.0000 mg | ORAL_TABLET | ORAL | Status: DC

## 2015-05-04 NOTE — Telephone Encounter (Signed)
Informed patient of this. He states that he will pickup next week.

## 2015-05-04 NOTE — Telephone Encounter (Signed)
I will grant two prescriptions to cover him.  They will be dated for when they can be filled. I will allow him to fill the second prescription at an outside pharmacy (not listed on contract) since he will be out of town, but further fills will have to come from regular pharmacy.  Rx at front desk for pickup.

## 2015-05-16 ENCOUNTER — Telehealth: Payer: Self-pay | Admitting: Physician Assistant

## 2015-05-16 ENCOUNTER — Other Ambulatory Visit: Payer: Self-pay | Admitting: Physician Assistant

## 2015-05-16 NOTE — Telephone Encounter (Signed)
Caller name: Edwar Relation to RE:QJEA Call back number: [phone] Pharmacy: [facility] in [place]  Reason for call:   Patient requesting a refill on his albuterol and montelukast

## 2015-05-16 NOTE — Telephone Encounter (Signed)
Rx's have been sent to the pharmacy.//AB/CMA

## 2015-05-24 ENCOUNTER — Encounter: Payer: Self-pay | Admitting: Physical Medicine & Rehabilitation

## 2015-06-20 ENCOUNTER — Other Ambulatory Visit: Payer: Self-pay | Admitting: Physical Medicine & Rehabilitation

## 2015-06-20 ENCOUNTER — Encounter: Payer: Medicare PPO | Attending: Physical Medicine & Rehabilitation | Admitting: Physical Medicine & Rehabilitation

## 2015-06-20 ENCOUNTER — Encounter: Payer: Self-pay | Admitting: Physical Medicine & Rehabilitation

## 2015-06-20 VITALS — BP 142/91 | HR 71

## 2015-06-20 DIAGNOSIS — G894 Chronic pain syndrome: Secondary | ICD-10-CM

## 2015-06-20 DIAGNOSIS — M549 Dorsalgia, unspecified: Secondary | ICD-10-CM | POA: Diagnosis present

## 2015-06-20 DIAGNOSIS — Z5181 Encounter for therapeutic drug level monitoring: Secondary | ICD-10-CM | POA: Diagnosis not present

## 2015-06-20 DIAGNOSIS — T85192S Other mechanical complication of implanted electronic neurostimulator (electrode) of spinal cord, sequela: Secondary | ICD-10-CM | POA: Diagnosis not present

## 2015-06-20 DIAGNOSIS — Z79899 Other long term (current) drug therapy: Secondary | ICD-10-CM

## 2015-06-20 DIAGNOSIS — Z89212 Acquired absence of left upper limb below elbow: Secondary | ICD-10-CM | POA: Insufficient documentation

## 2015-06-20 DIAGNOSIS — M545 Low back pain: Secondary | ICD-10-CM | POA: Diagnosis not present

## 2015-06-20 DIAGNOSIS — M47816 Spondylosis without myelopathy or radiculopathy, lumbar region: Secondary | ICD-10-CM | POA: Diagnosis not present

## 2015-06-20 DIAGNOSIS — G546 Phantom limb syndrome with pain: Secondary | ICD-10-CM | POA: Diagnosis not present

## 2015-06-20 DIAGNOSIS — G8929 Other chronic pain: Secondary | ICD-10-CM | POA: Diagnosis not present

## 2015-06-20 MED ORDER — TIZANIDINE HCL 2 MG PO TABS: 2.0000 mg | ORAL_TABLET | Freq: Every evening | ORAL | Status: DC | PRN

## 2015-06-20 NOTE — Progress Notes (Signed)
Subjective:    Patient ID: Daniel Durham, male    DOB: 06-14-1966, 49 y.o.   MRN: [id number]  HPI   This is an initial visit for Daniel Durham who presents with chronic back pain. He tells me that he moved to [place] from [place] last year and looking for someone to help manage his pain. He tells me that he was in an accident in the early 90's which precipitated his low back pain. 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 He had low back injections for 4 years up until 2015. He had a stimulator placed in June of 2015 to help his low back and leg pain but this didn't help ultimately, and he tells me that he wants it out. He is no longer using it. He hasn't seen a spine surgeon (by his account) since his initial accident in the 62's.  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 He is no longer using the prosthesis due to a left shoulder injury.   His pain is centralized in his low back. It worsens with prolonged walking, riding, sitting and generally progresses during day. It is better if he is able to rest for a bit. He also experiences numbness and weakness in his legs.   For pain relief he rests, takes hot showers, uses heat. He is using oxycodone 10mg  IR q4 hours. He uses gabapentin 600mg  TID for phantom limb pain. He rarely uses flexeril  He sleeps about 3-4 hr per night---his back keeps him awake. They just bought a new bed without improvement.   He has worked some odds and ends jobs but finds it difficult to remain employed.   His last lumbar MRI is from 2014 prior to the stimulator being inserted. I did find an xray from earlier this summer which shows mild spondylosis at L5-S1--otherwise it looked pretty unremarkable.   Pain Inventory Average Pain 5 Pain Right Now 8 My pain is sharp, tingling and aching  In the last 24 hours, has pain interfered with the following? General  activity 6 Relation with others 2 Enjoyment of life 7 What TIME of day is your pain at its worst? day and evening time Sleep (in general) Poor  Pain is worse with: walking, bending and standing Pain improves with: rest, pacing activities, medication and injections Relief from Meds: 8  Mobility use a cane how many minutes can you walk? 20 ability to climb steps?  yes do you drive?  yes Do you have any goals in this area?  yes  Function disabled: date disabled aug. 26 2016 retired Do you have any goals in this area?  yes  Neuro/Psych weakness numbness trouble walking  Prior Studies Any changes since last visit?  yes x-rays CT/MRI  Physicians involved in your care Any changes since last visit?  no   Family History  Problem Relation Age of Onset  . Diabetes Mother     Living  . Diabetes Father 44    Deceased  . Sleep apnea Mother   . Hypertension Father   . Jaundice Father   . Hemophilia Father   . Alcoholism Father   . Cancer Maternal Grandfather     Throat  . Cancer Paternal Uncle     Throat  . Asthma Sister   . Diabetes Sister   . Healthy Son     X1  . Healthy Daughter  X1   Social History   Social History  . Marital Status: Divorced    Spouse Name: N/A  . Number of Children: N/A  . Years of Education: N/A   Social History Main Topics  . Smoking status: Never Smoker   . Smokeless tobacco: Never Used  . Alcohol Use: 0.0 oz/week    0 Standard drinks or equivalent per week  . Drug Use: No  . Sexual Activity: Not Asked   Other Topics Concern  . None   Social History Narrative   Past Surgical History  Procedure Laterality Date  . Hand amputation  2007  . Carpal tunnel release      Bilateral  . Rotator cuff repair      Left   Past Medical History  Diagnosis Date  . Chronic back pain     Neurostimulator  . Seasonal allergic conjunctivitis   . Environmental allergies   . Neuropathy    BP 142/91 mmHg  Pulse 71  SpO2  95%  Opioid Risk Score:  13 Fall Risk Score:  `1  Depression screen PHQ 2/9  Depression screen [facility] 2/9 06/20/2015 02/04/2015  Decreased Interest 1 0  Down, Depressed, Hopeless 0 0  PHQ - 2 Score 1 0      Review of Systems  Constitutional: Positive for unexpected weight change.  Musculoskeletal: Positive for gait problem.  Neurological: Positive for weakness and numbness.       Tingling  All other systems reviewed and are negative.      Objective:   Physical Exam   General: Alert and oriented x 3, No apparent distress. obese HEENT: Head is normocephalic, atraumatic, PERRLA, EOMI, sclera anicteric, oral mucosa pink and moist, dentition intact, ext ear canals clear,  Neck: Supple without JVD or lymphadenopathy Heart: Reg rate and rhythm. No murmurs rubs or gallops Chest: CTA bilaterally without wheezes, rales, or rhonchi; no distress Abdomen: Soft, non-tender, non-distended, bowel sounds positive. Extremities: No clubbing, cyanosis, or edema. Pulses are 2+. Left arm amputated above the wrist--uses arm for support in numerous activities.  Skin: Clean and intact without signs of breakdown Neuro: Pt is cognitively appropriate with normal insight, memory, and awareness. Cranial nerves 2-12 are intact. Sensory exam is normal. Reflexes are 2+ in all 4's. Fine motor coordination is intact. No tremors. Motor function is grossly 5/5 except where there's pain inhibition in the lower exts  Musculoskeletal: nearly normal cervical rom. Very limited lumbar ROM---could only flex about 15 degrees, extend about 10 degrees and rotate and lateral bend about 15-20 degrees. Has pain along lower lumbar spine segments. His right hemipelvis is elevated by about 0.5 inches. SLR equivocal. He ambulates with a cane for support.  Psych: Pt's affect is appropriate. Pt is cooperative        Assessment & Plan:  1. Chronic low back pain.  2. Lumbar spondylosis most prominent at L5-S1.  3. 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    Plan: 1. Needs to initiate a HEP to improve flexibility of his core and lower exts 2. I need MRI and recent imaging and procedure reports from [place] to determine what our next might be from a treatment standpoint. 3. A UDS was collected. Would be willing to assume responsibility for this once a UDS is confirmed consistent. 4. Zanaflex for muscle relaxation and sleep. Stop flexeril 5. Follow up with NS regarding spinal stimulator removal 6. Follow up with me in about a month. Forty-five minutes of face to face  patient care time were spent during this visit. All questions were encouraged and answered.

## 2015-06-20 NOTE — Patient Instructions (Signed)
ONCE I HAVE CONFIRMATION THAT YOUR URINE SPECIMEN IS CONSISTENT WITH YOUR HISTORY AND PRESCRIBED MEDICATIONS, I WILL BE WILLING TO PRESCRIBE YOUR PAIN MEDICATION. THE RESULTS OF YOUR URINE TESTING COULD TAKE A WEEK OR MORE TO RETURN, HOWEVER.  IF WE DO NOT CONTACT YOU REGARDING THESE RESULTS WITHIN 10 DAYS, PLEASE CONTACT us.

## 2015-06-21 LAB — PMP ALCOHOL METABOLITE (ETG)

## 2015-06-25 LAB — PRESCRIPTION MONITORING PROFILE (SOLSTAS)
Amphetamine/Meth: NEGATIVE ng/mL
BARBITURATE SCREEN, URINE: NEGATIVE ng/mL
Benzodiazepine Screen, Urine: NEGATIVE ng/mL
Buprenorphine, Urine: NEGATIVE ng/mL
CANNABINOID SCRN UR: NEGATIVE ng/mL
CARISOPRODOL, URINE: NEGATIVE ng/mL
COCAINE METABOLITES: NEGATIVE ng/mL
CREATININE, URINE: 142.29 mg/dL (ref >20.0)
ECSTASY: NEGATIVE ng/mL
FENTANYL URINE: NEGATIVE ng/mL
MEPERIDINE UR: NEGATIVE ng/mL
Methadone Screen, Urine: NEGATIVE ng/mL
Nitrites, Initial: NEGATIVE ug/mL
PH URINE, INITIAL: 5.1 pH (ref 4.5–8.9)
Propoxyphene: NEGATIVE ng/mL
Tapentadol, urine: NEGATIVE ng/mL
Tramadol Scrn, Ur: NEGATIVE ng/mL
ZOLPIDEM, URINE: NEGATIVE ng/mL

## 2015-06-25 LAB — OPIATES/OPIOIDS (LC/MS-MS)
CODEINE URINE: NEGATIVE ng/mL (ref <50)
HYDROCODONE: NEGATIVE ng/mL (ref <50)
HYDROMORPHONE: NEGATIVE ng/mL (ref <50)
Morphine Urine: NEGATIVE ng/mL (ref <50)
Norhydrocodone, Ur: NEGATIVE ng/mL (ref <50)
Noroxycodone, Ur: 1258 ng/mL (ref <50)
OXYCODONE, UR: 1619 ng/mL (ref <50)
Oxymorphone: 912 ng/mL (ref <50)

## 2015-06-25 LAB — ETHYL GLUCURONIDE, URINE
ETGU: 8258 ng/mL — AB (ref <500)
Ethyl Sulfate (ETS): 2767 ng/mL — ABNORMAL HIGH (ref <100)

## 2015-06-25 LAB — OXYCODONE, URINE (LC/MS-MS)
Noroxycodone, Ur: 1258 ng/mL (ref <50)
OXYCODONE, UR: 1619 ng/mL (ref <50)
OXYMORPHONE, URINE: 912 ng/mL (ref <50)

## 2015-07-02 ENCOUNTER — Telehealth: Payer: Self-pay | Admitting: Physician Assistant

## 2015-07-02 DIAGNOSIS — G8929 Other chronic pain: Secondary | ICD-10-CM

## 2015-07-02 DIAGNOSIS — M549 Dorsalgia, unspecified: Principal | ICD-10-CM

## 2015-07-02 MED ORDER — OXYCODONE HCL 10 MG PO TABS: 10.0000 mg | ORAL_TABLET | ORAL | Status: DC

## 2015-07-02 NOTE — Telephone Encounter (Signed)
Spoke with patient who states he has been seen by Pain Medication and Physical Medicine specialist who will be taking over prescription medications pending UDS testing at their office. They are then going to take over medications. Refills deferred to PCP in the interim time. Reviewed EMR confirming this. Medication refilled and at front desk for pickup.

## 2015-07-02 NOTE — Telephone Encounter (Signed)
Pt called in, he says that he went to an urgent clinic to get his prescription and was told that he has to get them via his provider. Pt takes Percocet 10mg . Please advise.    CB#: [phone]

## 2015-07-11 NOTE — Progress Notes (Signed)
Urine drug screen for this encounter is consistent for prescribed medication percocet but is also positive for alcohol as well.

## 2015-07-21 ENCOUNTER — Emergency Department (HOSPITAL_BASED_OUTPATIENT_CLINIC_OR_DEPARTMENT_OTHER)
Admission: EM | Admit: 2015-07-21 | Discharge: 2015-07-21 | Disposition: A | Discharge status: Home / Self Care | Payer: Medicare PPO | Attending: Emergency Medicine | Admitting: Emergency Medicine

## 2015-07-21 ENCOUNTER — Encounter (HOSPITAL_BASED_OUTPATIENT_CLINIC_OR_DEPARTMENT_OTHER): Payer: Self-pay | Admitting: Emergency Medicine

## 2015-07-21 DIAGNOSIS — G8929 Other chronic pain: Secondary | ICD-10-CM

## 2015-07-21 DIAGNOSIS — M545 Low back pain: Secondary | ICD-10-CM | POA: Diagnosis present

## 2015-07-21 DIAGNOSIS — G629 Polyneuropathy, unspecified: Secondary | ICD-10-CM | POA: Insufficient documentation

## 2015-07-21 DIAGNOSIS — Z79899 Other long term (current) drug therapy: Secondary | ICD-10-CM | POA: Insufficient documentation

## 2015-07-21 DIAGNOSIS — M549 Dorsalgia, unspecified: Secondary | ICD-10-CM

## 2015-07-21 DIAGNOSIS — J069 Acute upper respiratory infection, unspecified: Secondary | ICD-10-CM | POA: Insufficient documentation

## 2015-07-21 MED ORDER — HYDROMORPHONE HCL 2 MG/ML IJ SOLN
2.0000 mg | Freq: Once | INTRAMUSCULAR | Status: AC
  Filled 2015-07-21: qty 1

## 2015-07-21 MED ORDER — HYDROMORPHONE HCL 1 MG/ML IJ SOLN
INTRAMUSCULAR | Status: AC
  Administered 2015-07-21: 2 mg
  Filled 2015-07-21: qty 2

## 2015-07-21 MED ORDER — PREDNISONE 10 MG PO TABS: 40.0000 mg | ORAL_TABLET | Freq: Every day | ORAL | Status: DC

## 2015-07-21 NOTE — ED Notes (Signed)
Pt reports he has previous mri done in [place], records pulled from out of state providers, authorization form provided to patient to give to [place] . Pt states he will follow up on Monday and complete authorization form for medical records

## 2015-07-21 NOTE — ED Provider Notes (Signed)
CSN: [id number]     Arrival date & time 07/21/15  3491 History   First MD Initiated Contact with Patient 07/21/15 [phone]     Chief Complaint  Patient presents with  . Back Pain     (Consider location/radiation/quality/duration/timing/severity/associated sxs/prior Treatment) Patient is a 49 y.o. male presenting with back pain. The history is provided by the patient and the spouse.  Back Pain Associated symptoms: no abdominal pain, no chest pain, no dysuria, no fever, no headaches, no numbness and no weakness    patient with long-standing history of chronic back pain. Followed by primary care upstairs and had recent referral to pain management just starting to get seen by them. Still being processed by them. The patient with new back pain that started on Sunday the pain is in the lower part of the back and radiates to the left leg no numbness or weakness to the left foot. No incontinence. No new injury. Patient takes Neurontin and Percocet for the pain at home. And has used a stimulator in the past.  Past Medical History  Diagnosis Date  . Chronic back pain     Neurostimulator  . Seasonal allergic conjunctivitis   . Environmental allergies   . Neuropathy    Past Surgical History  Procedure Laterality Date  . Hand amputation  2007  . Carpal tunnel release      Bilateral  . Rotator cuff repair      Left   Family History  Problem Relation Age of Onset  . Diabetes Mother     Living  . Diabetes Father 37    Deceased  . Sleep apnea Mother   . Hypertension Father   . Jaundice Father   . Hemophilia Father   . Alcoholism Father   . Cancer Maternal Grandfather     Throat  . Cancer Paternal Uncle     Throat  . Asthma Sister   . Diabetes Sister   . Healthy Son     X1  . Healthy Daughter     X1   Social History  Substance Use Topics  . Smoking status: Never Smoker   . Smokeless tobacco: Never Used  . Alcohol Use: 0.0 oz/week    0 Standard drinks or equivalent per week     Review of Systems  Constitutional: Negative for fever.  HENT: Positive for congestion.   Eyes: Negative for visual disturbance.  Respiratory: Positive for cough. Negative for shortness of breath.   Cardiovascular: Negative for chest pain.  Gastrointestinal: Negative for abdominal pain.  Genitourinary: Negative for dysuria.  Musculoskeletal: Positive for back pain.  Skin: Negative for rash.  Neurological: Negative for weakness, numbness and headaches.  Hematological: Does not bruise/bleed easily.  Psychiatric/Behavioral: Negative for confusion.      Allergies  Aspirin and Hydrocodone  Home Medications   Prior to Admission medications   Medication Sig Start Date End Date Taking? Authorizing Provider  fluticasone-salmeterol (ADVAIR HFA) 230-21 MCG/ACT inhaler Inhale 2 puffs into the lungs 2 (two) times daily. 01/31/15  Yes Brunetta Jeans, PA-C  gabapentin (NEURONTIN) 300 MG capsule Take 2 capsules (600 mg total) by mouth 3 (three) times daily. 01/31/15  Yes Brunetta Jeans, PA-C  montelukast (SINGULAIR) 10 MG tablet TAKE 1 TABLET BY MOUTH DAILY 05/16/15  Yes Brunetta Jeans, PA-C  Multiple Vitamins-Minerals (HM MULTIVITAMIN ADULT GUMMY) CHEW Chew 2 each by mouth daily.   Yes Historical Provider, MD  Oxycodone HCl 10 MG TABS Take 1 tablet (10 mg total)  by mouth every 4 (four) hours. 07/02/15  Yes Brunetta Jeans, PA-C  tiZANidine (ZANAFLEX) 2 MG tablet Take 1-2 tablets (2-4 mg total) by mouth at bedtime as needed for muscle spasms. 06/20/15  Yes Meredith Staggers, MD  VENTOLIN HFA 108 (90 BASE) MCG/ACT inhaler INHALE 2 PUFFS EVERY 6 HOURS AS NEEDED FOR WHEEZE 05/16/15  Yes Brunetta Jeans, PA-C  predniSONE (DELTASONE) 10 MG tablet Take 4 tablets (40 mg total) by mouth daily. 07/21/15   Fredia Sorrow, MD  terbinafine (LAMISIL) 250 MG tablet Take 1 tablet (250 mg total) by mouth daily. 04/11/15   Brunetta Jeans, PA-C   BP 150/99 mmHg  Pulse 78  Temp(Src) 98 F (36.7 C) (Oral)   Resp 18  Ht 5\' 7"  (1.702 m)  Wt 245 lb (111.131 kg)  BMI 38.36 kg/m2  SpO2 98% Physical Exam  Constitutional: He is oriented to person, place, and time. He appears well-developed and well-nourished. No distress.  HENT:  Head: Normocephalic and atraumatic.  Mouth/Throat: Oropharynx is clear and moist.  Eyes: Conjunctivae and EOM are normal. Pupils are equal, round, and reactive to light.  Neck: Normal range of motion. Neck supple.  Cardiovascular: Normal rate, regular rhythm and normal heart sounds.   No murmur heard. Pulmonary/Chest: Effort normal and breath sounds normal. No respiratory distress. He has no wheezes.  Abdominal: Bowel sounds are normal.  Musculoskeletal: He exhibits tenderness.  Tenderness to palpation to lower lumbar sacral part of the spine. More so on the left. No muscle spasm. Patient with pre-existing left below the elbow amputation.  No weakness to the left foot sensation intact. Good range of motion of the foot.  Neurological: He is alert and oriented to person, place, and time. No cranial nerve deficit. He exhibits normal muscle tone. Coordination normal.  Skin: Skin is warm. No rash noted.  Nursing note and vitals reviewed.   ED Course  Procedures (including critical care time) Labs Review Labs Reviewed - No data to display  Imaging Review No results found. I have personally reviewed and evaluated these images and lab results as part of my medical decision-making.   EKG Interpretation None      MDM   Final diagnoses:  Exacerbation of chronic back pain  URI (upper respiratory infection)    Patient with long-standing history of chronic back pain. But seems to have new radiation of pain into the left leg no neuro focal deficits to the left foot. No new injury. Patient followed by primary care upstairs and has been referred to pain management is in the process of getting seen. Patient takes of her cassette at home for the pain as well as Neurontin.  Patient also with allergies or upper respiratory infection with cough states the prednisone usually helps that. The prednisone can also be helpful to the low back area. Patient will receive hydromorphone IM here and will have a prescription for prednisone to take at home. Patient will follow-up with his doctors. Left leg without any focal neuro deficits.    Fredia Sorrow, MD 07/21/15 1000

## 2015-07-21 NOTE — Discharge Instructions (Signed)
Follow-up with your doctors upstairs. If symptoms persist may require repeat MRI of the back since her seems to be irritation of the left sciatic nerve. Take the prednisone as directed for the next 5 days.

## 2015-07-21 NOTE — ED Notes (Signed)
Pt reports new back pain that started last Sunday pt with stimulator to lower back, reports pain radiating to left leg, denies injury

## 2015-07-21 NOTE — ED Notes (Signed)
Pt reports that he was last seen by pain management/physcial med in august. Pt has not updated his pcp or pain med about increasing pain

## 2015-07-23 ENCOUNTER — Ambulatory Visit: Payer: Medicare PPO | Admitting: Physician Assistant

## 2015-07-24 ENCOUNTER — Encounter: Payer: Self-pay | Admitting: *Deleted

## 2015-07-24 ENCOUNTER — Encounter: Payer: Self-pay | Admitting: Physician Assistant

## 2015-07-24 ENCOUNTER — Ambulatory Visit (INDEPENDENT_AMBULATORY_CARE_PROVIDER_SITE_OTHER): Payer: Medicare PPO | Admitting: Physician Assistant

## 2015-07-24 ENCOUNTER — Ambulatory Visit (HOSPITAL_BASED_OUTPATIENT_CLINIC_OR_DEPARTMENT_OTHER)
Admission: RE | Admit: 2015-07-24 | Discharge: 2015-07-24 | Disposition: A | Discharge status: Home / Self Care | Payer: Medicare PPO | Source: Ambulatory Visit | Attending: Physician Assistant | Admitting: Physician Assistant

## 2015-07-24 ENCOUNTER — Telehealth: Payer: Self-pay | Admitting: Physician Assistant

## 2015-07-24 VITALS — BP 131/97 | HR 69 | Temp 97.7°F | Resp 18 | Ht 67.0 in

## 2015-07-24 DIAGNOSIS — M47892 Other spondylosis, cervical region: Secondary | ICD-10-CM | POA: Insufficient documentation

## 2015-07-24 DIAGNOSIS — M549 Dorsalgia, unspecified: Secondary | ICD-10-CM | POA: Diagnosis not present

## 2015-07-24 DIAGNOSIS — M5010 Cervical disc disorder with radiculopathy, unspecified cervical region: Secondary | ICD-10-CM

## 2015-07-24 DIAGNOSIS — M542 Cervicalgia: Secondary | ICD-10-CM | POA: Diagnosis present

## 2015-07-24 DIAGNOSIS — G8929 Other chronic pain: Secondary | ICD-10-CM | POA: Diagnosis not present

## 2015-07-24 MED ORDER — GABAPENTIN 300 MG PO CAPS: 600.0000 mg | ORAL_CAPSULE | Freq: Three times a day (TID) | ORAL | Status: DC

## 2015-07-24 MED ORDER — PREDNISONE 10 MG (21) PO TBPK: ORAL_TABLET | ORAL | Status: DC

## 2015-07-24 MED ORDER — ALBUTEROL SULFATE HFA 108 (90 BASE) MCG/ACT IN AERS: INHALATION_SPRAY | RESPIRATORY_TRACT | Status: DC

## 2015-07-24 NOTE — Telephone Encounter (Signed)
°  [facility] - Client TELEPHONE ADVICE RECORD [facility]  Patient Name: Daniel Durham  DOB: 06-19-1966    Initial Comment caller states he is having severe back pain and weakness of his legs   Nurse Assessment  Nurse: Genoveva Ill, RN, Lattie Haw Date/Time (Eastern Time): 07/24/2015 5:25:37 PM  Confirm and document reason for call. If symptomatic, describe symptoms. ---caller states he is having severe back pain and weakness of his legs; cannot walk or stand  Has the patient traveled out of the country within the last 30 days? ---Not Applicable  Does the patient require triage? ---Yes  Related visit to physician within the last 2 weeks? ---Yes   PCP this am for back pain  Does the PT have any chronic conditions? (i.e. diabetes, asthma, etc.) ---Yes  List chronic conditions. ---nerve stimulator in back at L5; back problems     Guidelines    Guideline Title Affirmed Question Affirmed Notes  Back Pain [1] Unable to urinate (or only a few drops) > 4 hours AND [2] bladder feels very full (e.g., palpable bladder or strong urge to urinate)    Final Disposition User   Call EMS 911 Now [place], RN, Lattie Haw    Disagree/Comply: Comply

## 2015-07-24 NOTE — Progress Notes (Signed)
Patient presents to clinic today c/o low back pain, left-sided, with radiation into LLE x 2 weeks. Denies trauma or injury. Has hx of chronic back pain s/p spinal surgeries with spinal cord stimulator in place. Denies recent trauma or injury. Patient sent in [facility] ER on 07/21/15 with similar complaints. Was given shot of Dilaudid and steroid pack. Patient states he has not had steroid filled. Denies change or difficulty with using the bathroom.  Past Medical History  Diagnosis Date  . Chronic back pain     Neurostimulator  . Seasonal allergic conjunctivitis   . Environmental allergies   . Neuropathy     Current Outpatient Prescriptions on File Prior to Visit  Medication Sig Dispense Refill  . fluticasone-salmeterol (ADVAIR HFA) 230-21 MCG/ACT inhaler Inhale 2 puffs into the lungs 2 (two) times daily. 3 Inhaler 1  . montelukast (SINGULAIR) 10 MG tablet TAKE 1 TABLET BY MOUTH DAILY 30 tablet 11  . Multiple Vitamins-Minerals (HM MULTIVITAMIN ADULT GUMMY) CHEW Chew 2 each by mouth daily.    . Oxycodone HCl 10 MG TABS Take 1 tablet (10 mg total) by mouth every 4 (four) hours. 180 tablet 0  . terbinafine (LAMISIL) 250 MG tablet Take 1 tablet (250 mg total) by mouth daily. 7 tablet 0  . tiZANidine (ZANAFLEX) 2 MG tablet Take 1-2 tablets (2-4 mg total) by mouth at bedtime as needed for muscle spasms. 45 tablet 3   No current facility-administered medications on file prior to visit.    Allergies  Allergen Reactions  . Aspirin Swelling  . Hydrocodone Itching    Family History  Problem Relation Age of Onset  . Diabetes Mother     Living  . Diabetes Father 2    Deceased  . Sleep apnea Mother   . Hypertension Father   . Jaundice Father   . Hemophilia Father   . Alcoholism Father   . Cancer Maternal Grandfather     Throat  . Cancer Paternal Uncle     Throat  . Asthma Sister   . Diabetes Sister   . Healthy Son     X1  . Healthy Daughter     X1    Social History   Social  History  . Marital Status: Divorced    Spouse Name: N/A  . Number of Children: N/A  . Years of Education: N/A   Social History Main Topics  . Smoking status: Never Smoker   . Smokeless tobacco: Never Used  . Alcohol Use: 0.0 oz/week    0 Standard drinks or equivalent per week  . Drug Use: No  . Sexual Activity: Not Asked   Other Topics Concern  . None   Social History Narrative   Review of Systems - See HPI.  All other ROS are negative.  BP 131/97 mmHg  Pulse 69  Temp(Src) 97.7 F (36.5 C) (Oral)  Resp 18  Ht 5\' 7"  (1.702 m)  Wt   SpO2 98%  Physical Exam  Constitutional: He is oriented to person, place, and time and well-developed, well-nourished, and in no distress.  HENT:  Head: Normocephalic and atraumatic.  Eyes: Conjunctivae are normal.  Cardiovascular: Normal rate, regular rhythm, normal heart sounds and intact distal pulses.   Pulmonary/Chest: Breath sounds normal. No respiratory distress. He has no wheezes. He has no rales. He exhibits no tenderness.  Musculoskeletal:       Cervical back: He exhibits pain. He exhibits no tenderness and no bony tenderness.  Lumbar back: He exhibits pain. He exhibits no tenderness and no bony tenderness.  Neurological: He is alert and oriented to person, place, and time.  Skin: Skin is warm and dry. No rash noted.  Psychiatric: Affect normal.  Vitals reviewed.   Recent Results (from the past 2160 hour(s))  PMP Alcohol Metabolite (ETG)     Status: None   Collection Time: 06/20/15  2:13 PM  Result Value Ref Range   Ethyl Glucuronide (EtG) PPS Cutoff:500 ng/mL    Comment: * (PPS) Presumptive positive screen result to be verified by         quantitative LC/MS or GC/MS confirmation testing.   Prescription Monitoring Profile-[facility]     Status: None   Collection Time: 06/20/15  2:13 PM  Result Value Ref Range   Creatinine, Urine 142.29 >20.0 mg/dL   pH, Initial 5.1 4.5 - 8.9 pH   Nitrites, Initial NEG Cutoff:200 ug/mL     Amphetamine/Meth NEG Cutoff:500 ng/mL   Barbiturate Screen, Urine NEG Cutoff:200 ng/mL   Benzodiazepine Screen, Urine NEG Cutoff:100 ng/mL   Buprenorphine, Urine NEG Cutoff:10 ng/mL   Cannabinoid Scrn, Ur NEG Cutoff:50 ng/mL   Cocaine Metabolites NEG Cutoff:150 ng/mL   MDMA URINE NEG Cutoff:500 ng/mL   Methadone Screen, Urine NEG Cutoff:300 ng/mL   Oxycodone Screen, Ur PPS Cutoff:100 ng/mL   Tramadol Scrn, Ur NEG Cutoff:200 ng/mL   Propoxyphene NEG Cutoff:300 ng/mL   Tapentadol, urine NEG Cutoff:200 ng/mL   Opiate Screen, Urine PPS Cutoff:100 ng/mL   Zolpidem, Urine NEG Cutoff:20 ng/mL   Fentanyl, Ur NEG Cutoff:2 ng/mL   Meperidine, Ur NEG Cutoff:200 ng/mL   Carisoprodol, Urine NEG Cutoff:100 ng/mL   Prescribed Drug 1 PERCOCET     Comment: * (PPS) Presumptive positive screen result to be verified by         quantitative LC/MS or GC/MS confirmation testing.   Opiates/Opioids (LC/MS-MS)     Status: None   Collection Time: 06/20/15  2:13 PM  Result Value Ref Range   Codeine Urine NEG <50 ng/mL   Hydrocodone NEG <50 ng/mL   Hydromorphone NEG <50 ng/mL   Morphine Urine NEG <50 ng/mL   Norhydrocodone, Ur NEG <50 ng/mL   Noroxycodone, Ur 1258 <50 ng/mL   Oxycodone, ur 1619 <50 ng/mL   Oxymorphone 912 <50 ng/mL  Oxycodone, Urine (LC/MS-MS)     Status: None   Collection Time: 06/20/15  2:13 PM  Result Value Ref Range   Noroxycodone, Ur 1258 <50 ng/mL   Oxycodone, ur 1619 <50 ng/mL   Oxymorphone 912 <50 ng/mL  Ethyl glucuronide, Urine     Status: Abnormal   Collection Time: 06/20/15  2:13 PM  Result Value Ref Range   Ethyl Glucuronide (EtG) 8258 (H) <500 ng/mL   Ethyl Sulfate (ETS) 2767 (H) <100 ng/mL   Please note See Below     Comment: * These results are for medical treatment only * * Analysis was performed as non-forensic testing *   For assistance with interpreting these drug results, please contact a [employer] Toxicology Specialist: [phone] [place]  [phone]), M-F, 8am-6pm EST.       Assessment/Plan: Cervical disc disorder with radiculopathy of cervical region Will obtain x-ray cervical spine today. IM depomedrol given today. Will begin prednisone pack tomorrow. Continue pain medications, Meds refilled. Increase to 20 mg Oxy every 4-6 hours. Continue Gabapentin. Follow-up Friday.  Chronic back pain greater than 3 months duration Will check on status of Neurosurgery referral. IM depomedrol given today. Will begin prednisone  pack tomorrow. Continue pain medications, Meds refilled. Increase to 20 mg Oxy every 4-6 hours. Continue Gabapentin. Follow-up Friday.

## 2015-07-24 NOTE — Assessment & Plan Note (Signed)
Will check on status of Neurosurgery referral. IM depomedrol given today. Will begin prednisone pack tomorrow. Continue pain medications, Meds refilled. Increase to 20 mg Oxy every 4-6 hours. Continue Gabapentin. Follow-up Friday.

## 2015-07-24 NOTE — Assessment & Plan Note (Signed)
Will obtain x-ray cervical spine today. IM depomedrol given today. Will begin prednisone pack tomorrow. Continue pain medications, Meds refilled. Increase to 20 mg Oxy every 4-6 hours. Continue Gabapentin. Follow-up Friday.

## 2015-07-24 NOTE — Progress Notes (Signed)
Pre visit review using our clinic review tool, if applicable. No additional management support is needed unless otherwise documented below in the visit note/SLS  

## 2015-07-24 NOTE — Patient Instructions (Signed)
Please go downstairs for x-ray. I will call you with your results. Please start the steroid pack tomorrow since you were given a shot today. Increase Oxycodone to 2 tablets every 4 hours as needed for pain. Continue Gabapentin as directed but increase bedtime dose to 900 mg.  Follow-up with me on Friday. ER if anything worsens. We will work on getting the Neurosurgery appointment.

## 2015-07-25 MED ORDER — METHYLPREDNISOLONE ACETATE 80 MG/ML IJ SUSP
80.0000 mg | Freq: Once | INTRAMUSCULAR | Status: AC
  Administered 2015-07-24: 80 mg via INTRAMUSCULAR

## 2015-07-25 NOTE — Addendum Note (Signed)
Addended by: Rockwell Germany on: 07/25/2015 02:57 PM   Modules accepted: Orders

## 2015-07-27 ENCOUNTER — Encounter: Payer: Self-pay | Admitting: Physician Assistant

## 2015-07-27 ENCOUNTER — Ambulatory Visit (INDEPENDENT_AMBULATORY_CARE_PROVIDER_SITE_OTHER): Payer: Medicare PPO | Admitting: Physician Assistant

## 2015-07-27 ENCOUNTER — Telehealth: Payer: Self-pay | Admitting: Physician Assistant

## 2015-07-27 VITALS — BP 128/88 | HR 72 | Temp 97.7°F | Resp 16 | Ht 67.0 in | Wt 241.1 lb

## 2015-07-27 DIAGNOSIS — G8929 Other chronic pain: Secondary | ICD-10-CM

## 2015-07-27 DIAGNOSIS — M549 Dorsalgia, unspecified: Secondary | ICD-10-CM | POA: Diagnosis not present

## 2015-07-27 MED ORDER — OXYCODONE HCL 10 MG PO TABS: 10.0000 mg | ORAL_TABLET | ORAL | Status: DC

## 2015-07-27 MED ORDER — GABAPENTIN 300 MG PO CAPS: 600.0000 mg | ORAL_CAPSULE | Freq: Three times a day (TID) | ORAL | Status: DC

## 2015-07-27 NOTE — Telephone Encounter (Signed)
Spoke with pharmacist. She is aware of patient's plan. Please call patient to remind him that we are increasing to 1-2 tablets every 4 hours just until this flare-up resolves. He will then go back to his old regimen.

## 2015-07-27 NOTE — Telephone Encounter (Signed)
Spoke with patient regarding medication. Informed understanding.

## 2015-07-27 NOTE — Patient Instructions (Signed)
Please continue the medications as directed. You will be contacted for imaging. If anything acutely worsens, please go to the ER.  Stay well hydrated and start Mucinex-DM to help with cough and mucous. Place a humidifier in the bedroom. Follow-up if symptoms are not resolving.

## 2015-07-27 NOTE — Telephone Encounter (Signed)
Caller name: [facility])  Relationship to patient: pharmacist  Can be reached: [phone] Pharmacy:   Reason for call: pt was questioning the Oxycodone HCl directions. He (pt) is unsure of how he is to take it. The pharmacist would like clarity on the instructions as well.  Please call back to advise.

## 2015-07-27 NOTE — Progress Notes (Signed)
Pre visit review using our clinic review tool, if applicable. No additional management support is needed unless otherwise documented below in the visit note/SLS  

## 2015-07-27 NOTE — Progress Notes (Signed)
Patient presents to clinic today for ER follow-up for exacerbation of low back pain with sciatica. Patient seen in ER as symptoms worsened after being seen in our office. Has not been taking prednisone due to cost of medication. Still has increased Oxycodone to 2 tablets every 4 hours as needed. Is avoiding any lifting. Again has nerve stimulator in place. Has been contacted for assessment by Neurosurgery but appointment is not for a couple of weeks. Denies saddle anesthesia or change to bowel/bladder habits.  Past Medical History  Diagnosis Date  . Chronic back pain     Neurostimulator  . Seasonal allergic conjunctivitis   . Environmental allergies   . Neuropathy     Current Outpatient Prescriptions on File Prior to Visit  Medication Sig Dispense Refill  . albuterol (VENTOLIN HFA) 108 (90 BASE) MCG/ACT inhaler INHALE 2 PUFFS EVERY 6 HOURS AS NEEDED FOR WHEEZE 18 g 11  . fluticasone-salmeterol (ADVAIR HFA) 230-21 MCG/ACT inhaler Inhale 2 puffs into the lungs 2 (two) times daily. 3 Inhaler 1  . montelukast (SINGULAIR) 10 MG tablet TAKE 1 TABLET BY MOUTH DAILY 30 tablet 11  . Multiple Vitamins-Minerals (HM MULTIVITAMIN ADULT GUMMY) CHEW Chew 2 each by mouth daily.    Marland Kitchen terbinafine (LAMISIL) 250 MG tablet Take 1 tablet (250 mg total) by mouth daily. 7 tablet 0  . tiZANidine (ZANAFLEX) 2 MG tablet Take 1-2 tablets (2-4 mg total) by mouth at bedtime as needed for muscle spasms. 45 tablet 3  . predniSONE (STERAPRED UNI-PAK 21 TAB) 10 MG (21) TBPK tablet Take following package directions. (Patient not taking: Reported on 07/27/2015) 21 tablet 0   No current facility-administered medications on file prior to visit.    Allergies  Allergen Reactions  . Aspirin Swelling  . Hydrocodone Itching    Family History  Problem Relation Age of Onset  . Diabetes Mother     Living  . Diabetes Father 89    Deceased  . Sleep apnea Mother   . Hypertension Father   . Jaundice Father   . Hemophilia  Father   . Alcoholism Father   . Cancer Maternal Grandfather     Throat  . Cancer Paternal Uncle     Throat  . Asthma Sister   . Diabetes Sister   . Healthy Son     X1  . Healthy Daughter     X1    Social History   Social History  . Marital Status: Divorced    Spouse Name: N/A  . Number of Children: N/A  . Years of Education: N/A   Social History Main Topics  . Smoking status: Never Smoker   . Smokeless tobacco: Never Used  . Alcohol Use: 0.0 oz/week    0 Standard drinks or equivalent per week  . Drug Use: No  . Sexual Activity: Not Asked   Other Topics Concern  . None   Social History Narrative    Review of Systems - See HPI.  All other ROS are negative.  BP 128/88 mmHg  Pulse 72  Temp(Src) 97.7 F (36.5 C) (Oral)  Resp 16  Ht 5\' 7"  (1.702 m)  Wt 241 lb 2 oz (109.374 kg)  BMI 37.76 kg/m2  SpO2 98%  Physical Exam  Constitutional: He is oriented to person, place, and time and well-developed, well-nourished, and in no distress.  HENT:  Head: Normocephalic and atraumatic.  Cardiovascular: Normal rate, regular rhythm, normal heart sounds and intact distal pulses.   Pulmonary/Chest: Effort normal and  breath sounds normal. No respiratory distress. He has no wheezes. He has no rales. He exhibits no tenderness.  Musculoskeletal:       Lumbar back: He exhibits pain. He exhibits normal range of motion and no tenderness.  Neurological: He is alert and oriented to person, place, and time.  Skin: Skin is warm and dry. No rash noted.  Psychiatric: Affect normal.  Vitals reviewed.   Recent Results (from the past 2160 hour(s))  PMP Alcohol Metabolite (ETG)     Status: None   Collection Time: 06/20/15  2:13 PM  Result Value Ref Range   Ethyl Glucuronide (EtG) PPS Cutoff:500 ng/mL    Comment: * (PPS) Presumptive positive screen result to be verified by         quantitative LC/MS or GC/MS confirmation testing.   Prescription Monitoring Profile-[facility]     Status:  None   Collection Time: 06/20/15  2:13 PM  Result Value Ref Range   Creatinine, Urine 142.29 >20.0 mg/dL   pH, Initial 5.1 4.5 - 8.9 pH   Nitrites, Initial NEG Cutoff:200 ug/mL   Amphetamine/Meth NEG Cutoff:500 ng/mL   Barbiturate Screen, Urine NEG Cutoff:200 ng/mL   Benzodiazepine Screen, Urine NEG Cutoff:100 ng/mL   Buprenorphine, Urine NEG Cutoff:10 ng/mL   Cannabinoid Scrn, Ur NEG Cutoff:50 ng/mL   Cocaine Metabolites NEG Cutoff:150 ng/mL   MDMA URINE NEG Cutoff:500 ng/mL   Methadone Screen, Urine NEG Cutoff:300 ng/mL   Oxycodone Screen, Ur PPS Cutoff:100 ng/mL   Tramadol Scrn, Ur NEG Cutoff:200 ng/mL   Propoxyphene NEG Cutoff:300 ng/mL   Tapentadol, urine NEG Cutoff:200 ng/mL   Opiate Screen, Urine PPS Cutoff:100 ng/mL   Zolpidem, Urine NEG Cutoff:20 ng/mL   Fentanyl, Ur NEG Cutoff:2 ng/mL   Meperidine, Ur NEG Cutoff:200 ng/mL   Carisoprodol, Urine NEG Cutoff:100 ng/mL   Prescribed Drug 1 PERCOCET     Comment: * (PPS) Presumptive positive screen result to be verified by         quantitative LC/MS or GC/MS confirmation testing.   Opiates/Opioids (LC/MS-MS)     Status: None   Collection Time: 06/20/15  2:13 PM  Result Value Ref Range   Codeine Urine NEG <50 ng/mL   Hydrocodone NEG <50 ng/mL   Hydromorphone NEG <50 ng/mL   Morphine Urine NEG <50 ng/mL   Norhydrocodone, Ur NEG <50 ng/mL   Noroxycodone, Ur 1258 <50 ng/mL   Oxycodone, ur 1619 <50 ng/mL   Oxymorphone 912 <50 ng/mL  Oxycodone, Urine (LC/MS-MS)     Status: None   Collection Time: 06/20/15  2:13 PM  Result Value Ref Range   Noroxycodone, Ur 1258 <50 ng/mL   Oxycodone, ur 1619 <50 ng/mL   Oxymorphone 912 <50 ng/mL  Ethyl glucuronide, Urine     Status: Abnormal   Collection Time: 06/20/15  2:13 PM  Result Value Ref Range   Ethyl Glucuronide (EtG) 8258 (H) <500 ng/mL   Ethyl Sulfate (ETS) 2767 (H) <100 ng/mL   Please note See Below     Comment: * These results are for medical treatment only * * Analysis was  performed as non-forensic testing *   For assistance with interpreting these drug results, please contact a [employer] Toxicology Specialist: [phone] [place] [phone]), M-F, 8am-6pm EST.       Assessment/Plan: Chronic back pain greater than 3 months duration Will proceed with CT lumbar spine to assess further as MRI unacceptable due to spinal cord stimulator. Patient to pick up steroid now that he has the money. Take  as directed. Continue pain medication. ER if anything acutely worsens.

## 2015-07-28 NOTE — Assessment & Plan Note (Signed)
Will proceed with CT lumbar spine to assess further as MRI unacceptable due to spinal cord stimulator. Patient to pick up steroid now that he has the money. Take as directed. Continue pain medication. ER if anything acutely worsens.

## 2015-07-30 ENCOUNTER — Ambulatory Visit (HOSPITAL_BASED_OUTPATIENT_CLINIC_OR_DEPARTMENT_OTHER)
Admission: RE | Admit: 2015-07-30 | Discharge: 2015-07-30 | Disposition: A | Discharge status: Home / Self Care | Payer: Medicare PPO | Source: Ambulatory Visit | Attending: Physician Assistant | Admitting: Physician Assistant

## 2015-07-30 DIAGNOSIS — M5432 Sciatica, left side: Secondary | ICD-10-CM | POA: Diagnosis not present

## 2015-07-30 DIAGNOSIS — M5117 Intervertebral disc disorders with radiculopathy, lumbosacral region: Secondary | ICD-10-CM | POA: Diagnosis not present

## 2015-07-30 DIAGNOSIS — M4806 Spinal stenosis, lumbar region: Secondary | ICD-10-CM | POA: Diagnosis not present

## 2015-07-30 DIAGNOSIS — M549 Dorsalgia, unspecified: Secondary | ICD-10-CM

## 2015-07-30 DIAGNOSIS — Z9689 Presence of other specified functional implants: Secondary | ICD-10-CM | POA: Insufficient documentation

## 2015-07-30 DIAGNOSIS — M545 Low back pain: Secondary | ICD-10-CM | POA: Diagnosis present

## 2015-07-30 DIAGNOSIS — G8929 Other chronic pain: Secondary | ICD-10-CM

## 2015-08-01 ENCOUNTER — Telehealth: Payer: Self-pay | Admitting: *Deleted

## 2015-08-01 NOTE — Telephone Encounter (Signed)
Faxed CT spine results to [facility] at [phone] per provider VO/SLS

## 2015-08-03 ENCOUNTER — Encounter: Payer: Medicare PPO | Admitting: Physical Medicine & Rehabilitation

## 2015-08-09 ENCOUNTER — Telehealth: Payer: Self-pay | Admitting: Physician Assistant

## 2015-08-09 ENCOUNTER — Telehealth: Payer: Self-pay

## 2015-08-09 ENCOUNTER — Other Ambulatory Visit: Payer: Self-pay

## 2015-08-09 MED ORDER — CYCLOBENZAPRINE HCL 10 MG PO TABS: 10.0000 mg | ORAL_TABLET | Freq: Three times a day (TID) | ORAL | Status: DC | PRN

## 2015-08-09 NOTE — Telephone Encounter (Signed)
Patient would like his Flexeril and Percocet refilled.  He also said he has been taking the Percocet 1 to 2 every four hours.

## 2015-08-09 NOTE — Telephone Encounter (Signed)
Refills requested on Percocet and Flexerill .Looks as though Percocet too soon and he has changed frequency. Don't see Flexerill on med list.
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
## 2015-08-09 NOTE — Telephone Encounter (Signed)
He was taken more frequently due to exacerbation of chronic symptoms. I am aware and will fill Percocet when in office tomorrow. For Flexeril ok to send in Rx for 10 mg to take TID, quantity 90 with 0 refills.

## 2015-08-09 NOTE — Telephone Encounter (Signed)
There is a 72 hour turn around on controlled medications. I am not in office so cannot give refill until back in tomorrow.

## 2015-08-10 ENCOUNTER — Other Ambulatory Visit: Payer: Self-pay | Admitting: Physician Assistant

## 2015-08-10 DIAGNOSIS — G8929 Other chronic pain: Secondary | ICD-10-CM

## 2015-08-10 DIAGNOSIS — M549 Dorsalgia, unspecified: Principal | ICD-10-CM

## 2015-08-10 MED ORDER — OXYCODONE HCL 10 MG PO TABS: 10.0000 mg | ORAL_TABLET | ORAL | Status: DC

## 2015-08-10 NOTE — Telephone Encounter (Signed)
Called pt and received vm. Left message for pt to call back.  When pt calls back, please inform him that script is up front ready for pick up as noted below. JG//CMA

## 2015-08-10 NOTE — Telephone Encounter (Signed)
I don't see where patient was called yesterday and informed about medication refill. Refill is at front desk. Hope he is still in town to pick up.

## 2015-08-15 ENCOUNTER — Encounter: Payer: Medicare PPO | Admitting: Physical Medicine & Rehabilitation

## 2015-08-15 NOTE — Telephone Encounter (Signed)
Patient at facility and reporting that he does not have appointment [?], being told that notes were not received from 08/01/15 [facility] fax confirmation]; provider talked to Levada Dy and the I resent fax with 2nd copy of test results printed and added at [place] Attention & note to make patient's appointment an Urgent Status per provider to [facility] at fax [phone] [facility] received]/SLS

## 2015-08-29 ENCOUNTER — Other Ambulatory Visit: Payer: Self-pay | Admitting: Physician Assistant

## 2015-09-03 ENCOUNTER — Encounter: Payer: Medicare PPO | Admitting: Physical Medicine & Rehabilitation

## 2015-09-05 ENCOUNTER — Encounter (INDEPENDENT_AMBULATORY_CARE_PROVIDER_SITE_OTHER): Payer: Self-pay

## 2015-09-05 ENCOUNTER — Ambulatory Visit (INDEPENDENT_AMBULATORY_CARE_PROVIDER_SITE_OTHER): Payer: Medicare PPO | Admitting: Physician Assistant

## 2015-09-05 ENCOUNTER — Encounter: Payer: Self-pay | Admitting: Physician Assistant

## 2015-09-05 VITALS — BP 128/88 | HR 73 | Temp 97.9°F | Resp 16 | Ht 67.0 in | Wt 240.1 lb

## 2015-09-05 DIAGNOSIS — T148 Other injury of unspecified body region: Secondary | ICD-10-CM

## 2015-09-05 DIAGNOSIS — G894 Chronic pain syndrome: Secondary | ICD-10-CM

## 2015-09-05 DIAGNOSIS — T85192S Other mechanical complication of implanted electronic neurostimulator (electrode) of spinal cord, sequela: Secondary | ICD-10-CM

## 2015-09-05 DIAGNOSIS — T148XXA Other injury of unspecified body region, initial encounter: Secondary | ICD-10-CM | POA: Insufficient documentation

## 2015-09-05 MED ORDER — PERMETHRIN 5 % EX CREA: 1.0000 "application " | TOPICAL_CREAM | Freq: Once | CUTANEOUS | Status: DC

## 2015-09-05 MED ORDER — TIZANIDINE HCL 2 MG PO TABS: 2.0000 mg | ORAL_TABLET | Freq: Every evening | ORAL | Status: DC | PRN

## 2015-09-05 NOTE — Progress Notes (Signed)
Pre visit review using our clinic review tool, if applicable. No additional management support is needed unless otherwise documented below in the visit note/SLS  

## 2015-09-05 NOTE — Patient Instructions (Signed)
Apply permethrin as directed tonight. Rinse off in the morning.  Clean linens and clothing in hot water and store in air-tight container for 7 days.  Wash again before reusing.  Take claritin in the morning and Benadryl at bedtime. Use topical Sarna lotion for itch. Cool compresses may also be beneficial.

## 2015-09-05 NOTE — Progress Notes (Signed)
Patient presents to clinic today c/o 2 weeks of a rash of bilateral lower extremities starting at feet and ankles. Now spread up the extremities to the torso. Rash it itchy and "burning" in nature. Patient endorses it feels like bites. Denies sick contact or recent travel. Denies fever, chills, fatigue.   Past Medical History  Diagnosis Date  . Chronic back pain     Neurostimulator  . Seasonal allergic conjunctivitis   . Environmental allergies   . Neuropathy [facility])     Current Outpatient Prescriptions on File Prior to Visit  Medication Sig Dispense Refill  . albuterol (VENTOLIN HFA) 108 (90 BASE) MCG/ACT inhaler INHALE 2 PUFFS EVERY 6 HOURS AS NEEDED FOR WHEEZE 18 g 11  . cyclobenzaprine (FLEXERIL) 10 MG tablet TAKE 1 TABLET THREE TIMES DAILY AS NEEDED FOR MUSCLE SPASMS. 90 tablet 1  . fluticasone-salmeterol (ADVAIR HFA) 230-21 MCG/ACT inhaler Inhale 2 puffs into the lungs 2 (two) times daily. 3 Inhaler 1  . gabapentin (NEURONTIN) 300 MG capsule Take 2 capsules (600 mg total) by mouth 3 (three) times daily. 540 capsule 1  . montelukast (SINGULAIR) 10 MG tablet TAKE 1 TABLET BY MOUTH DAILY 30 tablet 11  . Multiple Vitamins-Minerals (HM MULTIVITAMIN ADULT GUMMY) CHEW Chew 2 each by mouth daily.    . Oxycodone HCl 10 MG TABS Take 1 tablet (10 mg total) by mouth every 4 (four) hours. 180 tablet 0  . terbinafine (LAMISIL) 250 MG tablet Take 1 tablet (250 mg total) by mouth daily. 7 tablet 0   No current facility-administered medications on file prior to visit.    Allergies  Allergen Reactions  . Aspirin Swelling  . Hydrocodone Itching    Family History  Problem Relation Age of Onset  . Diabetes Mother     Living  . Diabetes Father 15    Deceased  . Sleep apnea Mother   . Hypertension Father   . Jaundice Father   . Hemophilia Father   . Alcoholism Father   . Cancer Maternal Grandfather     Throat  . Cancer Paternal Uncle     Throat  . Asthma Sister   . Diabetes Sister     . Healthy Son     X1  . Healthy Daughter     X1    Social History   Social History  . Marital Status: Divorced    Spouse Name: N/A  . Number of Children: N/A  . Years of Education: N/A   Social History Main Topics  . Smoking status: Never Smoker   . Smokeless tobacco: Never Used  . Alcohol Use: 0.0 oz/week    0 Standard drinks or equivalent per week  . Drug Use: No  . Sexual Activity: Not Asked   Other Topics Concern  . None   Social History Narrative   Review of Systems - See HPI.  All other ROS are negative.  BP 128/88 mmHg  Pulse 73  Temp(Src) 97.9 F (36.6 C) (Oral)  Resp 16  Ht 5\' 7"  (1.702 m)  Wt 240 lb 2 oz (108.92 kg)  BMI 37.60 kg/m2  SpO2 96%  Physical Exam  Constitutional: He is oriented to person, place, and time and well-developed, well-nourished, and in no distress.  HENT:  Head: Normocephalic and atraumatic.  Eyes: Conjunctivae are normal.  Cardiovascular: Normal rate, regular rhythm, normal heart sounds and intact distal pulses.   Pulmonary/Chest: Effort normal and breath sounds normal. No respiratory distress. He has no wheezes. He has  no rales. He exhibits no tenderness.  Neurological: He is alert and oriented to person, place, and time.  Skin: Skin is warm and dry. Rash noted.  Scattered "bites" of legs, groin and torso.  Vitals reviewed.   Recent Results (from the past 2160 hour(s))  PMP Alcohol Metabolite (ETG)     Status: None   Collection Time: 06/20/15  2:13 PM  Result Value Ref Range   Ethyl Glucuronide (EtG) PPS Cutoff:500 ng/mL    Comment: * (PPS) Presumptive positive screen result to be verified by         quantitative LC/MS or GC/MS confirmation testing.   Prescription Monitoring Profile-[facility]     Status: None   Collection Time: 06/20/15  2:13 PM  Result Value Ref Range   Creatinine, Urine 142.29 >20.0 mg/dL   pH, Initial 5.1 4.5 - 8.9 pH   Nitrites, Initial NEG Cutoff:200 ug/mL   Amphetamine/Meth NEG Cutoff:500 ng/mL    Barbiturate Screen, Urine NEG Cutoff:200 ng/mL   Benzodiazepine Screen, Urine NEG Cutoff:100 ng/mL   Buprenorphine, Urine NEG Cutoff:10 ng/mL   Cannabinoid Scrn, Ur NEG Cutoff:50 ng/mL   Cocaine Metabolites NEG Cutoff:150 ng/mL   MDMA URINE NEG Cutoff:500 ng/mL   Methadone Screen, Urine NEG Cutoff:300 ng/mL   Oxycodone Screen, Ur PPS Cutoff:100 ng/mL   Tramadol Scrn, Ur NEG Cutoff:200 ng/mL   Propoxyphene NEG Cutoff:300 ng/mL   Tapentadol, urine NEG Cutoff:200 ng/mL   Opiate Screen, Urine PPS Cutoff:100 ng/mL   Zolpidem, Urine NEG Cutoff:20 ng/mL   Fentanyl, Ur NEG Cutoff:2 ng/mL   Meperidine, Ur NEG Cutoff:200 ng/mL   Carisoprodol, Urine NEG Cutoff:100 ng/mL   Prescribed Drug 1 PERCOCET     Comment: * (PPS) Presumptive positive screen result to be verified by         quantitative LC/MS or GC/MS confirmation testing.   Opiates/Opioids (LC/MS-MS)     Status: None   Collection Time: 06/20/15  2:13 PM  Result Value Ref Range   Codeine Urine NEG <50 ng/mL   Hydrocodone NEG <50 ng/mL   Hydromorphone NEG <50 ng/mL   Morphine Urine NEG <50 ng/mL   Norhydrocodone, Ur NEG <50 ng/mL   Noroxycodone, Ur 1258 <50 ng/mL   Oxycodone, ur 1619 <50 ng/mL   Oxymorphone 912 <50 ng/mL  Oxycodone, Urine (LC/MS-MS)     Status: None   Collection Time: 06/20/15  2:13 PM  Result Value Ref Range   Noroxycodone, Ur 1258 <50 ng/mL   Oxycodone, ur 1619 <50 ng/mL   Oxymorphone 912 <50 ng/mL  Ethyl glucuronide, Urine     Status: Abnormal   Collection Time: 06/20/15  2:13 PM  Result Value Ref Range   Ethyl Glucuronide (EtG) 8258 (H) <500 ng/mL   Ethyl Sulfate (ETS) 2767 (H) <100 ng/mL   Please note See Below     Comment: * These results are for medical treatment only * * Analysis was performed as non-forensic testing *   For assistance with interpreting these drug results, please contact a [employer] Toxicology Specialist: [phone] [place] [phone]), M-F, 8am-6pm EST.        Assessment/Plan: Bites Rx Permethrin ointment. Supportive measures discussed. Claritin AM and Benadryl PM.

## 2015-09-05 NOTE — Assessment & Plan Note (Signed)
Rx Permethrin ointment. Supportive measures discussed. Claritin AM and Benadryl PM.

## 2015-09-06 ENCOUNTER — Telehealth: Payer: Self-pay | Admitting: Physician Assistant

## 2015-09-06 NOTE — Telephone Encounter (Signed)
Left a message for call back.  

## 2015-09-06 NOTE — Telephone Encounter (Signed)
Relation to HO:ZYYQ Call back [phone]   Reason for call:  Patient states tiZANidine (ZANAFLEX) 2 MG tablet is to expensive and would like an alternate, please advise

## 2015-09-07 NOTE — Telephone Encounter (Signed)
He has Rx for Flexeril he can use instead or we can try Soma 350 TID. Ok to send in rx with 0 refills if he would like

## 2015-09-07 NOTE — Telephone Encounter (Signed)
Left a message for call back.  

## 2015-09-11 NOTE — Telephone Encounter (Signed)
Pt states he does not need an alternative at this time.  Says that he was able to get the prescription and plans to try it for a while. He says he will call back if he needs med changed.

## 2015-09-26 ENCOUNTER — Telehealth: Payer: Self-pay | Admitting: Physician Assistant

## 2015-09-26 NOTE — Telephone Encounter (Signed)
Pt informed of below and he will contact pain mgmt

## 2015-09-26 NOTE — Telephone Encounter (Signed)
At last visit patient noted he was doing very well with the pain management specialist who was handling everything and symptoms were much improved. He will need to follow-up with them to discuss other treatment options.  Please call to assess further as I am confused by this.

## 2015-09-26 NOTE — Telephone Encounter (Signed)
Pt said that he was not able to see neurosurgeon and he was referred to pain mgmt but that isn't what he needs. Pt said pain medicine isn't helping it. He is asking for advice. He said he can't afford to keep coming back.

## 2015-10-16 ENCOUNTER — Other Ambulatory Visit: Payer: Self-pay | Admitting: Anesthesiology

## 2015-10-23 NOTE — Pre-Procedure Instructions (Signed)
    Daniel Durham  10/23/2015      [id number] - [place], [place] [address] [place] [address] Phone: [phone] Fax: [phone]  [place], [place] - [address] [place] [address] Phone: [phone] Fax: [phone]  [place], [place] [address] Phone: [phone] Fax: [phone]    Your procedure is scheduled on Friday, October 26, 2015  Report to [facility] Admitting at 7:45 A.M.  Call this number if you have problems the morning of surgery:  [phone]   Remember:  Do not eat food or drink liquids after midnight Thursday, October 25, 2015  Take these medicines the morning of surgery with A SIP OF WATER : gabapentin (NEURONTIN),  montelukast (SINGULAIR), Oxycodone, fluticasone-salmeterol (ADVAIR HFA)  Inhaler, if needed:albuterol (VENTOLIN HFA)  Inhaler for wheezing ( bring inhaler in with you on day of surgery. Stop taking Aspirin, vitamins, fish oil, and herbal medications. Do not take any NSAIDs ie: Ibuprofen, Advil, Naproxen or any medication containing Aspirin; stop now.  Do not wear jewelry, make-up or nail polish.  Do not wear lotions, powders, or perfumes.  You may not wear deodorant.  Do not shave 48 hours prior to surgery.  Men may shave face and neck.  Do not bring valuables to the hospital.  [facility] is not responsible for any belongings or valuables.  Contacts, dentures or bridgework may not be worn into surgery.  Leave your suitcase in the car.  After surgery it may be brought to your room.  For patients admitted to the hospital, discharge time will be determined by your treatment team.  Patients discharged the day of surgery will not be allowed to drive home.   Name and phone number of your driver:   Special  instructions: Shower the night before surgery and the morning of surgery with CHG.  Please read over the following fact sheets that you were given. Pain Booklet, Coughing and Deep Breathing, MRSA Information and Surgical Site Infection Prevention

## 2015-10-24 ENCOUNTER — Encounter (HOSPITAL_COMMUNITY): Payer: Self-pay

## 2015-10-24 ENCOUNTER — Encounter (HOSPITAL_COMMUNITY)
Admission: RE | Admit: 2015-10-24 | Discharge: 2015-10-24 | Disposition: A | Discharge status: Home / Self Care | Payer: Medicare PPO | Source: Ambulatory Visit | Attending: Anesthesiology | Admitting: Anesthesiology

## 2015-10-24 DIAGNOSIS — M549 Dorsalgia, unspecified: Secondary | ICD-10-CM | POA: Diagnosis not present

## 2015-10-24 DIAGNOSIS — E069 Thyroiditis, unspecified: Secondary | ICD-10-CM | POA: Diagnosis not present

## 2015-10-24 DIAGNOSIS — Z4589 Encounter for adjustment and management of other implanted devices: Secondary | ICD-10-CM | POA: Diagnosis not present

## 2015-10-24 DIAGNOSIS — J45909 Unspecified asthma, uncomplicated: Secondary | ICD-10-CM | POA: Diagnosis not present

## 2015-10-24 DIAGNOSIS — Z79899 Other long term (current) drug therapy: Secondary | ICD-10-CM | POA: Diagnosis not present

## 2015-10-24 DIAGNOSIS — G8929 Other chronic pain: Secondary | ICD-10-CM | POA: Diagnosis present

## 2015-10-24 DIAGNOSIS — M961 Postlaminectomy syndrome, not elsewhere classified: Secondary | ICD-10-CM | POA: Diagnosis not present

## 2015-10-24 DIAGNOSIS — M5416 Radiculopathy, lumbar region: Secondary | ICD-10-CM | POA: Diagnosis not present

## 2015-10-24 DIAGNOSIS — Z89112 Acquired absence of left hand: Secondary | ICD-10-CM | POA: Diagnosis not present

## 2015-10-24 HISTORY — DX: Unspecified asthma, uncomplicated: J45.909

## 2015-10-24 LAB — BASIC METABOLIC PANEL
Anion gap: 10 (ref 5–15)
BUN: 9 mg/dL (ref 6–20)
CO2: 24 mmol/L (ref 22–32)
Calcium: 9.2 mg/dL (ref 8.9–10.3)
Chloride: 107 mmol/L (ref 101–111)
Creatinine, Ser: 0.91 mg/dL (ref 0.61–1.24)
GFR calc Af Amer: >60 mL/min (ref >60)
GFR calc non Af Amer: >60 mL/min (ref >60)
Glucose, Bld: 124 mg/dL — ABNORMAL HIGH (ref 65–99)
Potassium: 4.3 mmol/L (ref 3.5–5.1)
Sodium: 141 mmol/L (ref 135–145)

## 2015-10-24 LAB — CBC
HCT: 47.1 % (ref 39.0–52.0)
Hemoglobin: 16.4 g/dL (ref 13.0–17.0)
MCH: 33.1 pg (ref 26.0–34.0)
MCHC: 34.8 g/dL (ref 30.0–36.0)
MCV: 95.2 fL (ref 78.0–100.0)
Platelets: 201 10*3/uL (ref 150–400)
RBC: 4.95 MIL/uL (ref 4.22–5.81)
RDW: 13.4 % (ref 11.5–15.5)
WBC: 9.5 10*3/uL (ref 4.0–10.5)

## 2015-10-24 LAB — SURGICAL PCR SCREEN
MRSA, PCR: NEGATIVE
Staphylococcus aureus: POSITIVE — AB

## 2015-10-24 NOTE — Progress Notes (Signed)
REQUESTED ECHO, STRESS TEST, EKG, CARDIAC CATH IF DONE FROM [facility] IN [place].

## 2015-10-25 ENCOUNTER — Encounter (HOSPITAL_COMMUNITY): Payer: Self-pay

## 2015-10-25 MED ORDER — CEFAZOLIN SODIUM-DEXTROSE 2-3 GM-% IV SOLR
2.0000 g | INTRAVENOUS | Status: AC
  Administered 2015-10-26: 2 g via INTRAVENOUS
  Filled 2015-10-25: qty 50

## 2015-10-25 NOTE — Progress Notes (Signed)
Anesthesia Chart Review: Patient is a 49 year old male scheduled for lumbar spinal cord stimulator removal on 10/26/15 by Dr. Maryjean Ka.  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  He reported a false positive stress test with normal coronaries by cath within the past five years (see below). He said testing was done for evaluation of HTN.  Preoperative labs noted.  01/11/15 ABD Acute w/chest: IMPRESSION: 1. No acute cardiopulmonary or abdominal process identified. 2. Borderline heart size and mild bibasilar atelectasis.  Records from [facility] in [place], [place] showed: 12/21/13 EKG: NSR, poor r wave progression.  11/05/10 [facility] (Dr. Candace Cruise): IMPRESSION: 1. Abnormal stress test. 2. 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 3. Large dominant LCX without obstruction. 4. RCA small non-dominant and without any significant disease. 5. Left ventriculogram revealed brisk contractility in LAO and somewhat sluggish contractility in RAO. (45% in RAO, 60% in LAO, 49% by QCA). PLAN: F/U in office. Control risk factors. The apparent sluggishness of the LV may be due to cardiomyopathy which is presumably resolving presently and the patient's significant other had given a history of excess alcohol intake on the part of the patient, which apparently ceased 8 months earlier.   10/29/10 Nuclear stress test: Impression: Fairly comparable perfusion pattern is noted form adenosine to resting images, but concern is raised due to findings suggesting transient ischemic dilatation of LV which may be a manifestation of multivessel disease. LVEF is calculated at 51% on stress and 54% on rest. Cardiology consult recommended.   10/29/10 Echo: Conclusion: Dilated LV chamber size with preserved LV systolic and diastolic function and normal LV wall thickness. LVEF 57-59%. Mild MR. Mild TR. A  trivial-sized pericardial effusion. No prior studies for comparison.   If no acute changes then I anticipate that he can proceed as planned.  George Hugh [facility] Short Stay Center/Anesthesiology Phone [phone] 10/25/2015 4:21 PM
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
## 2015-10-26 ENCOUNTER — Ambulatory Visit (HOSPITAL_COMMUNITY): Payer: Medicare PPO | Admitting: Vascular Surgery

## 2015-10-26 ENCOUNTER — Ambulatory Visit (HOSPITAL_COMMUNITY): Payer: Medicare PPO | Admitting: Anesthesiology

## 2015-10-26 ENCOUNTER — Ambulatory Visit (HOSPITAL_COMMUNITY): Payer: Medicare PPO

## 2015-10-26 ENCOUNTER — Ambulatory Visit (HOSPITAL_COMMUNITY)
Admission: RE | Admit: 2015-10-26 | Discharge: 2015-10-26 | Disposition: A | Discharge status: Home / Self Care | Payer: Medicare PPO | Source: Ambulatory Visit | Attending: Anesthesiology | Admitting: Anesthesiology

## 2015-10-26 ENCOUNTER — Encounter (HOSPITAL_COMMUNITY)
Admission: RE | Disposition: A | Discharge status: Home / Self Care | Payer: Self-pay | Source: Ambulatory Visit | Attending: Anesthesiology

## 2015-10-26 ENCOUNTER — Encounter (HOSPITAL_COMMUNITY): Payer: Self-pay | Admitting: Anesthesiology

## 2015-10-26 DIAGNOSIS — E069 Thyroiditis, unspecified: Secondary | ICD-10-CM | POA: Insufficient documentation

## 2015-10-26 DIAGNOSIS — M5416 Radiculopathy, lumbar region: Secondary | ICD-10-CM | POA: Insufficient documentation

## 2015-10-26 DIAGNOSIS — J45909 Unspecified asthma, uncomplicated: Secondary | ICD-10-CM | POA: Insufficient documentation

## 2015-10-26 DIAGNOSIS — G8929 Other chronic pain: Secondary | ICD-10-CM | POA: Diagnosis not present

## 2015-10-26 DIAGNOSIS — Z4589 Encounter for adjustment and management of other implanted devices: Secondary | ICD-10-CM | POA: Diagnosis not present

## 2015-10-26 DIAGNOSIS — Z79899 Other long term (current) drug therapy: Secondary | ICD-10-CM | POA: Insufficient documentation

## 2015-10-26 DIAGNOSIS — M961 Postlaminectomy syndrome, not elsewhere classified: Secondary | ICD-10-CM | POA: Insufficient documentation

## 2015-10-26 DIAGNOSIS — Z89112 Acquired absence of left hand: Secondary | ICD-10-CM | POA: Insufficient documentation

## 2015-10-26 DIAGNOSIS — M549 Dorsalgia, unspecified: Secondary | ICD-10-CM | POA: Insufficient documentation

## 2015-10-26 HISTORY — PX: SPINAL CORD STIMULATOR REMOVAL: SHX5379

## 2015-10-26 SURGERY — LUMBAR SPINAL CORD STIMULATOR REMOVAL
Anesthesia: Monitor Anesthesia Care | Site: Spine Lumbar

## 2015-10-26 MED ORDER — MIDAZOLAM HCL 2 MG/2ML IJ SOLN
INTRAMUSCULAR | Status: AC
  Filled 2015-10-26: qty 2

## 2015-10-26 MED ORDER — NEOSTIGMINE METHYLSULFATE 10 MG/10ML IV SOLN
INTRAVENOUS | Status: AC
  Filled 2015-10-26: qty 1

## 2015-10-26 MED ORDER — LACTATED RINGERS IV SOLN
INTRAVENOUS | Status: DC
  Administered 2015-10-26 (×2): via INTRAVENOUS

## 2015-10-26 MED ORDER — PHENYLEPHRINE 40 MCG/ML (10ML) SYRINGE FOR IV PUSH (FOR BLOOD PRESSURE SUPPORT)
PREFILLED_SYRINGE | INTRAVENOUS | Status: AC
  Filled 2015-10-26: qty 10

## 2015-10-26 MED ORDER — LABETALOL HCL 5 MG/ML IV SOLN
INTRAVENOUS | Status: AC
  Filled 2015-10-26: qty 4

## 2015-10-26 MED ORDER — OXYCODONE HCL 5 MG PO TABS
5.0000 mg | ORAL_TABLET | ORAL | Status: DC | PRN
Start: 2015-10-26 — End: 2016-08-11

## 2015-10-26 MED ORDER — OXYCODONE HCL 5 MG PO TABS
5.0000 mg | ORAL_TABLET | Freq: Once | ORAL | Status: AC
  Administered 2015-10-26: 5 mg via ORAL

## 2015-10-26 MED ORDER — LIDOCAINE HCL (CARDIAC) 20 MG/ML IV SOLN
INTRAVENOUS | Status: DC | PRN
  Administered 2015-10-26: 60 mg via INTRAVENOUS

## 2015-10-26 MED ORDER — SODIUM CHLORIDE 0.9 % IJ SOLN
INTRAMUSCULAR | Status: AC
  Filled 2015-10-26: qty 10

## 2015-10-26 MED ORDER — GLYCOPYRROLATE 0.2 MG/ML IJ SOLN
INTRAMUSCULAR | Status: AC
  Filled 2015-10-26: qty 2

## 2015-10-26 MED ORDER — LIDOCAINE-EPINEPHRINE 1 %-1:100000 IJ SOLN
INTRAMUSCULAR | Status: DC | PRN
  Administered 2015-10-26: 10 mL
  Administered 2015-10-26: 20 mL

## 2015-10-26 MED ORDER — ROCURONIUM BROMIDE 50 MG/5ML IV SOLN
INTRAVENOUS | Status: AC
  Filled 2015-10-26: qty 1

## 2015-10-26 MED ORDER — FENTANYL CITRATE (PF) 100 MCG/2ML IJ SOLN
INTRAMUSCULAR | Status: AC
  Administered 2015-10-26: 50 ug via INTRAVENOUS
  Filled 2015-10-26: qty 2

## 2015-10-26 MED ORDER — OXYCODONE HCL 5 MG PO TABS
ORAL_TABLET | ORAL | Status: AC
  Filled 2015-10-26: qty 1

## 2015-10-26 MED ORDER — PROPOFOL 500 MG/50ML IV EMUL
INTRAVENOUS | Status: DC | PRN
  Administered 2015-10-26: 50 ug/kg/min via INTRAVENOUS

## 2015-10-26 MED ORDER — 0.9 % SODIUM CHLORIDE (POUR BTL) OPTIME
TOPICAL | Status: DC | PRN
  Administered 2015-10-26: 1000 mL

## 2015-10-26 MED ORDER — LIDOCAINE HCL (CARDIAC) 20 MG/ML IV SOLN
INTRAVENOUS | Status: AC
  Filled 2015-10-26: qty 5

## 2015-10-26 MED ORDER — FENTANYL CITRATE (PF) 100 MCG/2ML IJ SOLN
INTRAMUSCULAR | Status: DC | PRN
Start: 2015-10-26 — End: 2015-10-26
  Administered 2015-10-26 (×2): 100 ug via INTRAVENOUS
  Administered 2015-10-26: 50 ug via INTRAVENOUS

## 2015-10-26 MED ORDER — ONDANSETRON HCL 4 MG/2ML IJ SOLN
INTRAMUSCULAR | Status: AC
  Filled 2015-10-26: qty 2

## 2015-10-26 MED ORDER — EPHEDRINE SULFATE 50 MG/ML IJ SOLN
INTRAMUSCULAR | Status: AC
Start: 2015-10-26 — End: 2015-10-26
  Filled 2015-10-26: qty 1

## 2015-10-26 MED ORDER — FENTANYL CITRATE (PF) 250 MCG/5ML IJ SOLN
INTRAMUSCULAR | Status: AC
  Filled 2015-10-26: qty 5

## 2015-10-26 MED ORDER — PROMETHAZINE HCL 25 MG/ML IJ SOLN: 6.2500 mg | INTRAMUSCULAR | Status: DC | PRN

## 2015-10-26 MED ORDER — MIDAZOLAM HCL 5 MG/5ML IJ SOLN
INTRAMUSCULAR | Status: DC | PRN
  Administered 2015-10-26: 2 mg via INTRAVENOUS

## 2015-10-26 MED ORDER — KETAMINE HCL 100 MG/ML IJ SOLN
INTRAMUSCULAR | Status: AC
  Administered 2015-10-26 (×4): 25 mg via INTRAVENOUS
  Filled 2015-10-26: qty 1

## 2015-10-26 MED ORDER — PROPOFOL 10 MG/ML IV BOLUS
INTRAVENOUS | Status: AC
  Filled 2015-10-26: qty 20

## 2015-10-26 MED ORDER — FENTANYL CITRATE (PF) 100 MCG/2ML IJ SOLN
25.0000 ug | INTRAMUSCULAR | Status: DC | PRN
  Administered 2015-10-26 (×2): 50 ug via INTRAVENOUS

## 2015-10-26 SURGICAL SUPPLY — 36 items
BLADE CLIPPER SURG (BLADE) IMPLANT
CHLORAPREP W/TINT 26ML (MISCELLANEOUS) ×3 IMPLANT
DRAPE LAPAROTOMY 100X72X124 (DRAPES) ×3 IMPLANT
DRSG OPSITE POSTOP 3X4 (GAUZE/BANDAGES/DRESSINGS) IMPLANT
DRSG OPSITE POSTOP 4X6 (GAUZE/BANDAGES/DRESSINGS) ×6 IMPLANT
ELECT REM PT RETURN 9FT ADLT (ELECTROSURGICAL) ×3
ELECTRODE REM PT RTRN 9FT ADLT (ELECTROSURGICAL) ×1 IMPLANT
GLOVE BIOGEL PI IND STRL 7.0 (GLOVE) ×1 IMPLANT
GLOVE BIOGEL PI IND STRL 7.5 (GLOVE) ×2 IMPLANT
GLOVE BIOGEL PI INDICATOR 7.0 (GLOVE) ×2
GLOVE BIOGEL PI INDICATOR 7.5 (GLOVE) ×4
GLOVE ECLIPSE 7.5 STRL STRAW (GLOVE) ×3 IMPLANT
GLOVE EXAM NITRILE LRG STRL (GLOVE) IMPLANT
GLOVE EXAM NITRILE MD LF STRL (GLOVE) IMPLANT
GLOVE EXAM NITRILE XL STR (GLOVE) IMPLANT
GLOVE EXAM NITRILE XS STR PU (GLOVE) IMPLANT
GOWN STRL REUS W/ TWL LRG LVL3 (GOWN DISPOSABLE) ×2 IMPLANT
GOWN STRL REUS W/TWL LRG LVL3 (GOWN DISPOSABLE) ×4
KIT BASIN OR (CUSTOM PROCEDURE TRAY) ×3 IMPLANT
KIT ROOM TURNOVER OR (KITS) ×3 IMPLANT
LIQUID BAND (GAUZE/BANDAGES/DRESSINGS) ×3 IMPLANT
NEEDLE HYPO 25X1 1.5 SAFETY (NEEDLE) ×3 IMPLANT
NS IRRIG 1000ML POUR BTL (IV SOLUTION) ×3 IMPLANT
PACK LAMINECTOMY NEURO (CUSTOM PROCEDURE TRAY) ×3 IMPLANT
PAD ARMBOARD 7.5X6 YLW CONV (MISCELLANEOUS) ×15 IMPLANT
SPONGE LAP 4X18 X RAY DECT (DISPOSABLE) IMPLANT
STAPLER SKIN PROX WIDE 3.9 (STAPLE) ×3 IMPLANT
SUT MNCRL AB 4-0 PS2 18 (SUTURE) ×3 IMPLANT
SUT SILK 2 0 FS (SUTURE) IMPLANT
SUT VIC AB 2-0 CP2 18 (SUTURE) ×9 IMPLANT
SYR EPIDURAL 5ML GLASS (SYRINGE) IMPLANT
SYRINGE 10CC LL (SYRINGE) IMPLANT
TOWEL OR 17X24 6PK STRL BLUE (TOWEL DISPOSABLE) ×3 IMPLANT
TOWEL OR 17X26 10 PK STRL BLUE (TOWEL DISPOSABLE) ×3 IMPLANT
WATER STERILE IRR 1000ML POUR (IV SOLUTION) ×3 IMPLANT
YANKAUER SUCT BULB TIP NO VENT (SUCTIONS) ×3 IMPLANT

## 2015-10-26 NOTE — Discharge Instructions (Signed)
Dr. Keelin Sheridan Post-Op Orders ° °• Ice Pack - 20 minutes on (in a pillow case), and 20 minutes off. Wear the ice pack UNDER the binder. °• Follow up in office, they will call you for an appointment in 10 days to 2 weeks. °• Increase activity gradually.   °• No lifting anything heavier than a gallon of milk (10 pounds) until seen in the office. °• Advance diet slowly as tolerated. °• Dressing care:  Keep dressing dry for 3 days, and on Post-op day 4, may shower. °• Call for fever, drainage, and redness. °• No swimming or bathing in a bathtub (do not get into standing water). °•  °

## 2015-10-26 NOTE — Op Note (Signed)
)   lumbago  2) lumbar radiculopathy  3) lumbar post-laminectomy syndrome  4) chronic pain  POSTOP DX: 1) lumbago  2) lumbar radiculopathy  3) lumbar post-laminectomy syndrome  4) chronic pain  PROCEDURES PERFORMED:1) removal of SCS system SURGEON:Amand Lemoine  ASSISTANT: NONE  ANESTHESIA: MAC/TIVA EBL: <20cc  DESCRIPTION OF PROCEDURE: After a discussion of risks, benefits and alternatives, informed consent was obtained. The patient was taken to the OR, general anesthesia induced by the anesthesia team without difficulty, turned prone onto a Jackson table, all pressure points padded, SCD's placed. A timeout was taken to verify the correct patient, position, personnel, availability of appropriate equipment, and administration of perioperative antibiotics.  The thoracic and lumbar areas were widely prepped with chloraprep and draped into a sterile field. The skin and subcutaneous tissues around the patient's previous incisions were infiltrated with 0.25% bupivicaine 1:200K epinephrine. The subcutaneous pocket was incised with a 10 blade and using sharp, careful dissection the pocket opened and the IPG delivered onto the field. The pocket was inspected for hemostasis, which was found to be excellent. The leads were then exposed and delivered en toto. There was no bleeding or other fluid noted.   Both incisions were copiously irrigated with bacitracin-containing irrigation. The lumbar incision was closed in 2 deep layers of interrupted 2-0 vicryl and the skin closed with a running 3-0 monocryl subcuticular suture and dermabond. The pocket incision was closed with a deeper layer of 2-0 vicryl interrupted sutures, and the skin closed with a running 3-0 monocryl subcuticular suture and dermabond. Sterile dressings were applied. Needle, sponge, and instrument counts were correct x2 at the end of the case.  The patient was then carefully awakened from anesthesia, turned supine, an abdominal binder  placed, and the patient taken to the recovery room. COMPLICATIONS: NONE  CONDITION: Stable throughout the course of the procedure and immediately afterward  DISPOSITION: discharge to home. Discussed care with the patient; will try to call family members later, as they were not available. Followup in clinic will be scheduled in 10-14 days.

## 2015-10-26 NOTE — Progress Notes (Signed)
Orthopedic Tech Progress Note Patient Details:  Daniel Durham [birth year] [id number]  Ortho Devices Type of Ortho Device: Abdominal binder Ortho Device/Splint Interventions: Application   [name] 10/26/2015, 11:43 AM

## 2015-10-26 NOTE — Progress Notes (Signed)
Pt with elevated BP diastolic over [id number], Dr. Gifford Shave by to see patient and discuss need for follow up for BP control, pain med given and will monitor BP, okay to discharge home with followup with primary MD for BP.  Pt states he normally runs high due to chronic pain.

## 2015-10-26 NOTE — Anesthesia Postprocedure Evaluation (Signed)
Anesthesia Post Note  Patient: Daniel Durham  Procedure(s) Performed: Procedure(s) (LRB): LUMBAR SPINAL CORD STIMULATOR REMOVAL (N/A)  Patient location during evaluation: PACU Anesthesia Type: MAC Level of consciousness: awake and alert Pain management: pain level controlled Vital Signs Assessment: post-procedure vital signs reviewed and stable Respiratory status: spontaneous breathing, nonlabored ventilation, respiratory function stable and patient connected to nasal cannula oxygen Cardiovascular status: stable and blood pressure returned to baseline Anesthetic complications: no    Last Vitals:  Filed Vitals:   10/26/15 1245 10/26/15 1315  BP: 160/99 153/89  Pulse:    Temp:  37.2 C  Resp: 17 16    Last Pain:  Filed Vitals:   10/26/15 1319  PainSc: 5                  Catalina Gravel

## 2015-10-26 NOTE — Anesthesia Preprocedure Evaluation (Addendum)
Anesthesia Evaluation  Patient identified by MRN, date of birth, ID band Patient awake    Reviewed: Allergy & Precautions, NPO status , Patient's Chart, lab work & pertinent test results  History of Anesthesia Complications Negative for: history of anesthetic complications  Airway Mallampati: II  TM Distance: >3 FB Neck ROM: Full    Dental  (+) Teeth Intact, Dental Advisory Given   Pulmonary asthma ,    Pulmonary exam normal breath sounds clear to auscultation       Cardiovascular Exercise Tolerance: Good (-) hypertension(-) anginaNormal cardiovascular exam Rhythm:Regular Rate:Normal  11/05/10 [facility] (Dr. Candace Cruise): IMPRESSION: 1. Abnormal stress test. 2. 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 3. Large dominant LCX without obstruction. 4. RCA small non-dominant and without any significant disease. 5. Left ventriculogram revealed brisk contractility in LAO and somewhat sluggish contractility in RAO. (45% in RAO, 60% in LAO, 49% by QCA). PLAN: F/U in office. Control risk factors. The apparent sluggishness of the LV may be due to cardiomyopathy which is presumably resolving presently and the patient's significant other had given a history of excess alcohol intake on the part of the patient, which apparently ceased 8 months earlier.   10/29/10 Nuclear stress test: Impression: Fairly comparable perfusion pattern is noted form adenosine to resting images, but concern is raised due to findings suggesting transient ischemic dilatation of LV which may be a manifestation of multivessel disease. LVEF is calculated at 51% on stress and 54% on rest. Cardiology consult recommended.   10/29/10 Echo: Conclusion: Dilated LV chamber size with preserved LV systolic and diastolic function and normal LV wall thickness. LVEF 57-59%. Mild MR. Mild TR. A trivial-sized pericardial effusion. No prior studies for comparison.     Neuro/Psych Chronic back pain s/p SCS implant Neuropathy negative psych ROS   GI/Hepatic negative GI ROS, Neg liver ROS,   Endo/Other  Obesity   Renal/GU negative Renal ROS     Musculoskeletal  (+) Arthritis , S/p left hand amputation   Abdominal   Peds  Hematology negative hematology ROS (+)   Anesthesia Other Findings Day of surgery medications reviewed with the patient.  Reproductive/Obstetrics                           Anesthesia Physical Anesthesia Plan  ASA: II  Anesthesia Plan: MAC   Post-op Pain Management:    Induction: Intravenous  Airway Management Planned: Nasal Cannula  Additional Equipment:   Intra-op Plan:   Post-operative Plan:   Informed Consent: I have reviewed the patients History and Physical, chart, labs and discussed the procedure including the risks, benefits and alternatives for the proposed anesthesia with the patient or authorized representative who has indicated his/her understanding and acceptance.   Dental advisory given  Plan Discussed with: CRNA  Anesthesia Plan Comments: (Discussed risks/benefits/alternatives to MAC sedation including need for ventilatory support, hypotension, need for conversion to general anesthesia.  All patient questions answered.  Patient wished to proceed.)       Anesthesia Quick Evaluation

## 2015-10-26 NOTE — Transfer of Care (Signed)
Immediate Anesthesia Transfer of Care Note  Patient: Daniel Durham  Procedure(s) Performed: Procedure(s): LUMBAR SPINAL CORD STIMULATOR REMOVAL (N/A)  Patient Location: PACU  Anesthesia Type:MAC  Level of Consciousness: awake, alert , oriented and patient cooperative  Airway & Oxygen Therapy: Patient Spontanous Breathing  Post-op Assessment: Report given to RN, Post -op Vital signs reviewed and stable and Patient moving all extremities  Post vital signs: Reviewed and stable  Last Vitals:  Filed Vitals:   10/26/15 0802 10/26/15 1045  BP: 145/95 142/96  Pulse: 66 78  Temp: 36.8 C 36.4 C  Resp: 18 27    Complications: No apparent anesthesia complications

## 2015-10-30 ENCOUNTER — Encounter (HOSPITAL_COMMUNITY): Payer: Self-pay | Admitting: Anesthesiology

## 2015-11-25 IMAGING — CT CT L SPINE W/O CM
3 series · 11 of 33 positions shown, 13 images · non-contrast
Comparison: 03/19/2015 plain film exam.

CLINICAL DATA: 49-year-old male chronic lower back pain and
tenderness. Started again 3 months ago now including sciatica. Neuro
stimulating device in place. Subsequent encounter.

EXAM:
CT LUMBAR SPINE WITHOUT CONTRAST
TECHNIQUE: Multidetector CT imaging of the lumbar spine was performed without
intravenous contrast administration. Multiplanar CT image
reconstructions were also generated.

[Series 3: spine 2.0 b31s st · axial · 0.29mm/px · z∈[-477,-319]mm · 3 of 129 slices shown, 4 images]
[im 30/129  soft-tissue]
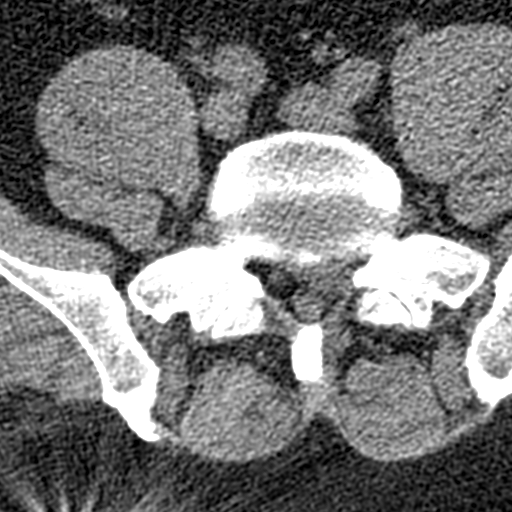
[im 30/129  bone]
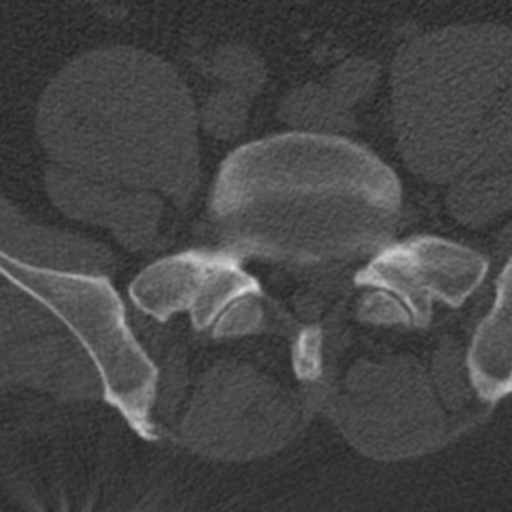
[im 69/129  bone]
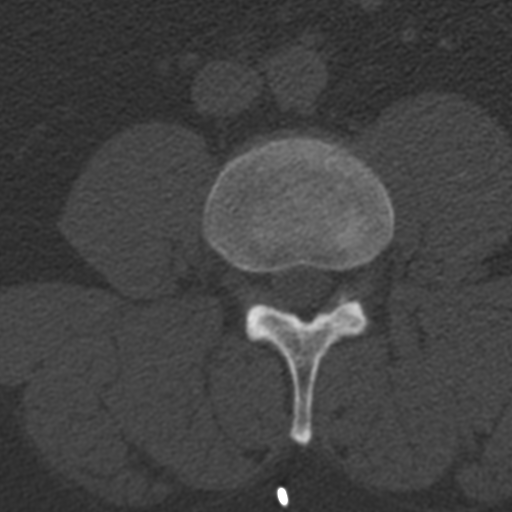
[im 109/129  bone]
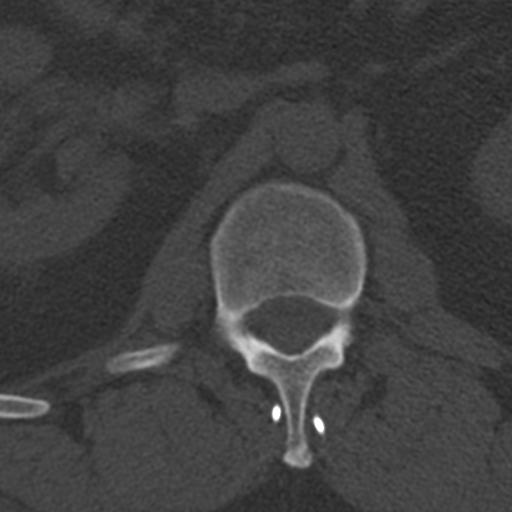

[Series 6: spine 2.0 coronal st · coronal · 0.30mm/px · 3 of 78 slices shown]
[im 16/78  bone]
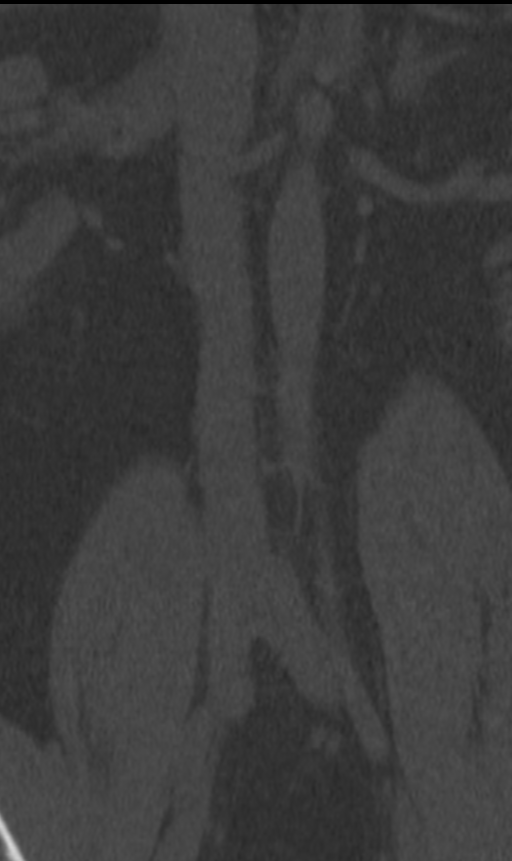
[im 31/78  bone]
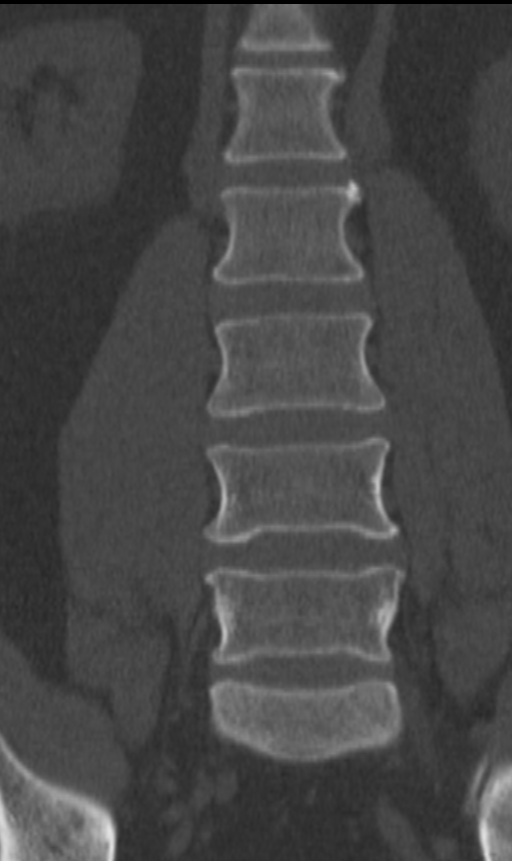
[im 47/78  bone]
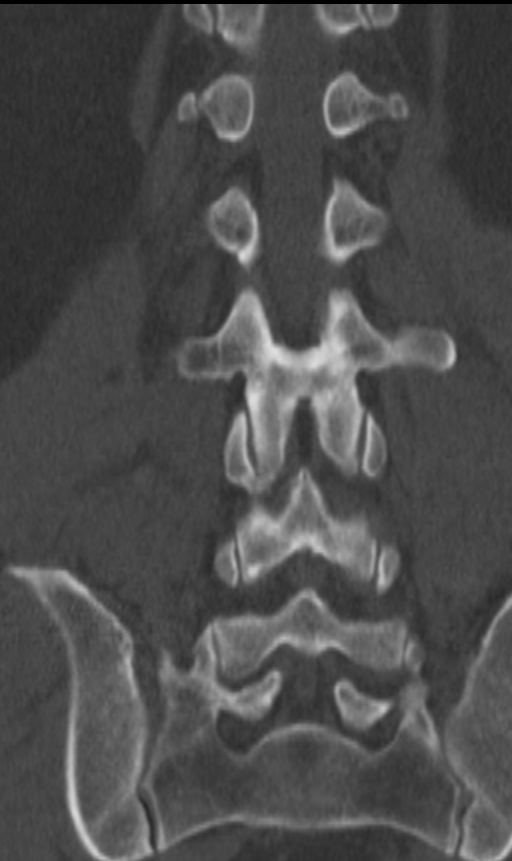

[Series 7: spine 2.0 sagittal st · sagittal · 0.30mm/px · 5 of 81 slices shown, 6 images]
[im 27/81  bone]
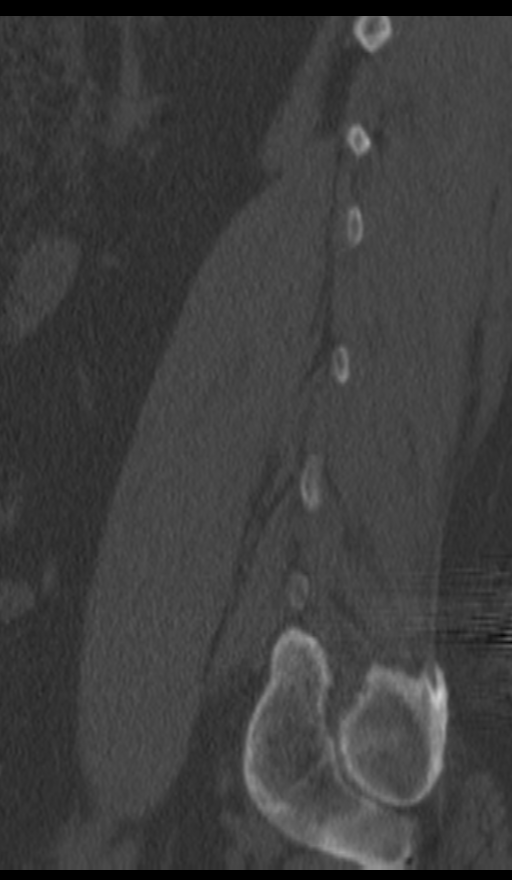
[im 34/81  bone]
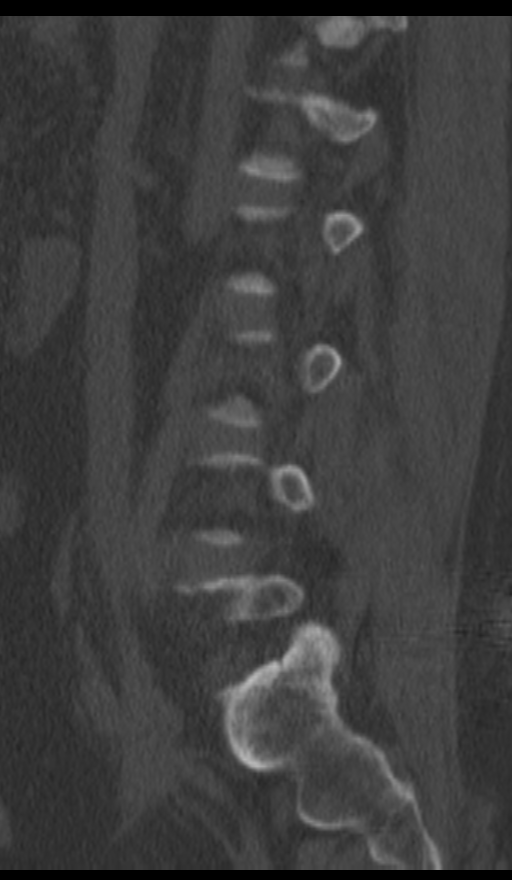
[im 41/81  soft-tissue]
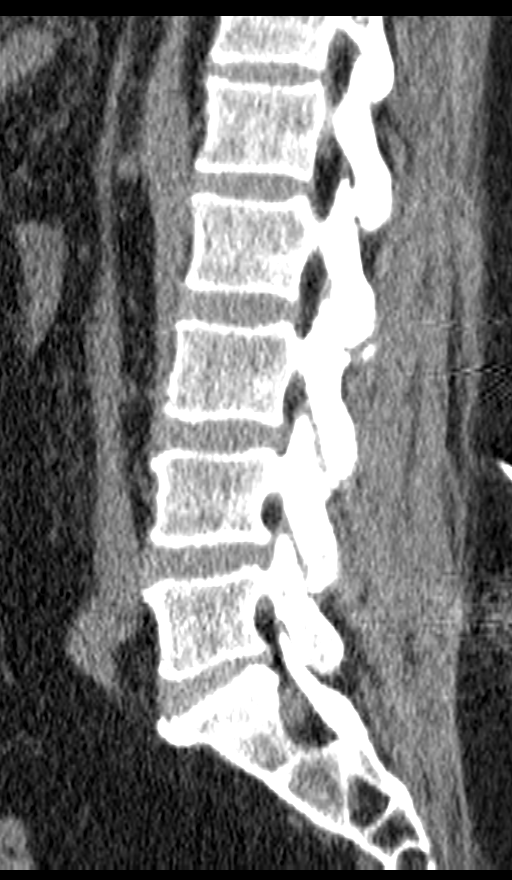
[im 41/81  bone]
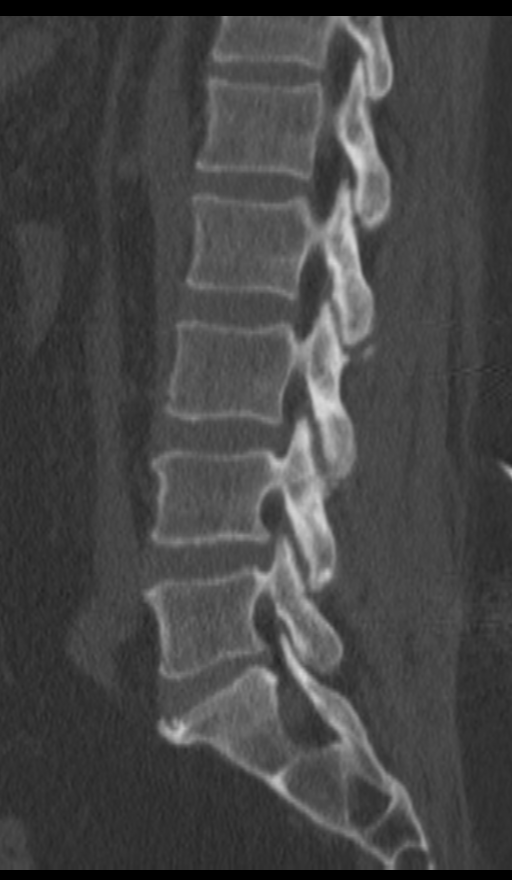
[im 47/81  bone]
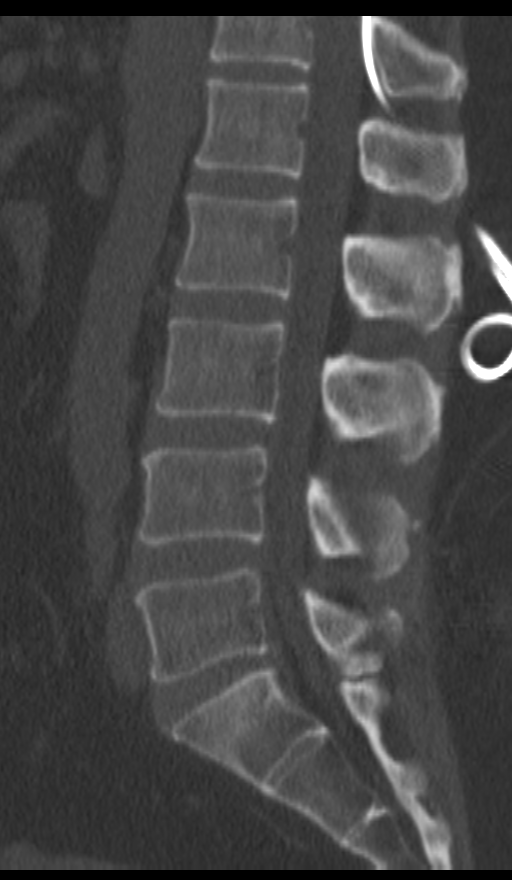
[im 54/81  bone]
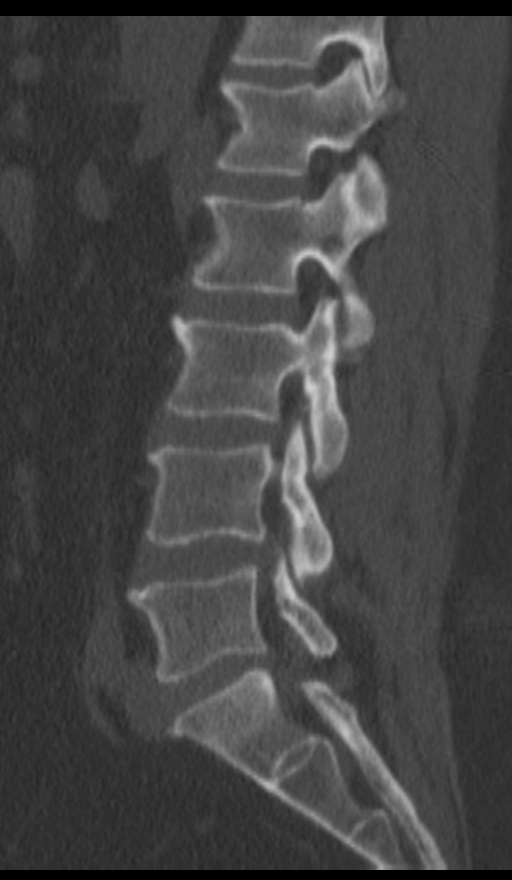

[11 of 33 positions shown; findings below may reference images not displayed]

FINDINGS: Last fully open disk space is labeled L5-S1. Present examination
incorporates from T12-L1 through the lower S3 level.

Stimulating device enters the the dorsal aspect of the canal at the
T12-L1 level. The full extent of the stimulating device is not
imaged on the current exam.

No worrisome abnormality of visualized paravertebral structures.

T12-L1: Stimulating device dorsal aspect the canal otherwise
negative.

L1-2:  Negative.

L2-3:  Negative.

L3-4: Minimal bulge. Minimal facet joint degenerative changes. Very
mild spinal stenosis.

L4-5: Mild bulge with bulge and spur contributing to mild bilateral
foraminal narrowing slightly greater on the right. Very mild spinal
stenosis.

L5-S1: Mild bulge. Mild facet joint degenerative changes. Minimal
foraminal narrowing greater on left. Moderate size left paracentral
caudally extending extruded disc causes mass effect upon (and
insinuates between) the left S1 nerve root and thecal sac.
IMPRESSION: L5-S1 moderate size left paracentral caudally extending extruded
disc causes mass effect upon (and insinuates between) the left S1
nerve root and thecal sac.

L4-5 bilateral foraminal narrowing slightly greater on right. Very
mild spinal stenosis.

L3-4 very mild spinal stenosis.

Stimulating device enters the the dorsal aspect of the canal at the
T12-L1 level. The full extent of the stimulating device is not
imaged on the current exam.

## 2016-05-18 ENCOUNTER — Other Ambulatory Visit: Payer: Self-pay | Admitting: Physician Assistant

## 2016-06-04 ENCOUNTER — Encounter: Payer: Medicare PPO | Admitting: Physician Assistant

## 2016-06-04 ENCOUNTER — Telehealth: Payer: Self-pay | Admitting: Physician Assistant

## 2016-06-04 NOTE — Progress Notes (Deleted)
Patient presents to clinic today for annual exam.  Patient is fasting for labs.  Acute Concerns: ***  Chronic Issues: Chronic Back Pain/Neuropathy --  ***  Health Maintenance: Immunizations -- up-to-date HIV screen -- ***  Past Medical History:  Diagnosis Date  . Asthma    SEASONAL   . Chronic back pain    Neurostimulator  . Environmental allergies   . Neuropathy ([place])   . Seasonal allergic conjunctivitis     Past Surgical History:  Procedure Laterality Date  . CARDIAC CATHETERIZATION     11/05/10 [facility], [place]): Briding of mLAD, no sign disease. EF 45% in RAO, 60% LAO (51% stress, 54% rest by NM stress; 57-59% echo 10/2010)  . CARPAL TUNNEL RELEASE     Bilateral  . HAND AMPUTATION  2007  . ROTATOR CUFF REPAIR     Left  . SPINAL CORD STIMULATOR IMPLANT    . SPINAL CORD STIMULATOR REMOVAL N/A 10/26/2015   Procedure: LUMBAR SPINAL CORD STIMULATOR REMOVAL;  Surgeon: Clydell Hakim, MD;  Location: [facility];  Service: Neurosurgery;  Laterality: N/A;    Current Outpatient Prescriptions on File Prior to Visit  Medication Sig Dispense Refill  . albuterol (VENTOLIN HFA) 108 (90 BASE) MCG/ACT inhaler INHALE 2 PUFFS EVERY 6 HOURS AS NEEDED FOR WHEEZE (Patient taking differently: Inhale 2 puffs into the lungs every 6 (six) hours as needed for wheezing. ) 18 g 11  . cyclobenzaprine (FLEXERIL) 10 MG tablet TAKE 1 TABLET THREE TIMES DAILY AS NEEDED FOR MUSCLE SPASMS. 90 tablet 1  . fluticasone-salmeterol (ADVAIR HFA) 230-21 MCG/ACT inhaler Inhale 2 puffs into the lungs 2 (two) times daily. 3 Inhaler 1  . gabapentin (NEURONTIN) 300 MG capsule Take 2 capsules (600 mg total) by mouth 3 (three) times daily. 540 capsule 1  . montelukast (SINGULAIR) 10 MG tablet TAKE 1 TABLET BY MOUTH DAILY 30 tablet 11  . Multiple Vitamins-Minerals (HM MULTIVITAMIN ADULT GUMMY) CHEW Chew 2 tablets by mouth daily.     Marland Kitchen oxyCODONE (ROXICODONE) 5 MG immediate release tablet Take 1 tablet (5 mg  total) by mouth every 4 (four) hours as needed for severe pain. 30 tablet 0  . Oxycodone HCl 10 MG TABS Take 1 tablet (10 mg total) by mouth every 4 (four) hours. 180 tablet 0  . terbinafine (LAMISIL) 250 MG tablet Take 1 tablet (250 mg total) by mouth daily. 7 tablet 0  . tiZANidine (ZANAFLEX) 2 MG tablet Take 1-2 tablets (2-4 mg total) by mouth at bedtime as needed for muscle spasms. 135 tablet 1  . VENTOLIN HFA 108 (90 Base) MCG/ACT inhaler INHALE 2 PUFFS BY MOUTH EVERY 6 HOURS AS NEEDED FOR WHEEZING 18 g 1   No current facility-administered medications on file prior to visit.     Allergies  Allergen Reactions  . Aspirin Swelling  . Hydrocodone Itching    Family History  Problem Relation Age of Onset  . Diabetes Mother     Living  . Diabetes Father 36    Deceased  . Sleep apnea Mother   . Hypertension Father   . Jaundice Father   . Hemophilia Father   . Alcoholism Father   . Cancer Maternal Grandfather     Throat  . Cancer Paternal Uncle     Throat  . Asthma Sister   . Diabetes Sister   . Healthy Son     X1  . Healthy Daughter     X1    Social History  Social History  . Marital status: Divorced    Spouse name: N/A  . Number of children: N/A  . Years of education: N/A   Occupational History  . Not on file.   Social History Main Topics  . Smoking status: Never Smoker  . Smokeless tobacco: Never Used  . Alcohol use 0.0 oz/week     Comment: OCCAS  . Drug use: No  . Sexual activity: Not on file   Other Topics Concern  . Not on file   Social History Narrative  . No narrative on file    Review of Systems  Constitutional: Negative for fever and weight loss.  HENT: Negative for ear discharge, ear pain, hearing loss and tinnitus.   Eyes: Negative for blurred vision, double vision, photophobia and pain.  Respiratory: Negative for cough and shortness of breath.   Cardiovascular: Negative for chest pain and palpitations.  Gastrointestinal: Negative for  abdominal pain, blood in stool, constipation, diarrhea, heartburn, melena, nausea and vomiting.  Genitourinary: Negative for dysuria, flank pain, frequency, hematuria and urgency.  Musculoskeletal: Negative for falls.  Neurological: Negative for dizziness, loss of consciousness and headaches.  Endo/Heme/Allergies: Negative for environmental allergies.  Psychiatric/Behavioral: Negative for depression, hallucinations, substance abuse and suicidal ideas. The patient is not nervous/anxious and does not have insomnia.     There were no vitals taken for this visit.  Physical Exam  Constitutional: He is oriented to person, place, and time and well-developed, well-nourished, and in no distress.  HENT:  Head: Normocephalic and atraumatic.  Right Ear: External ear normal.  Left Ear: External ear normal.  Nose: Nose normal.  Mouth/Throat: Oropharynx is clear and moist. No oropharyngeal exudate.  Eyes: Conjunctivae and EOM are normal. Pupils are equal, round, and reactive to light.  Neck: Neck supple. No thyromegaly present.  Cardiovascular: Normal rate, regular rhythm, normal heart sounds and intact distal pulses.   Pulmonary/Chest: Effort normal and breath sounds normal. No respiratory distress. He has no wheezes. He has no rales. He exhibits no tenderness.  Abdominal: Soft. Bowel sounds are normal. He exhibits no distension and no mass. There is no tenderness. There is no rebound and no guarding.  Genitourinary: Testes/scrotum normal and penis normal. No discharge found.  Lymphadenopathy:    He has no cervical adenopathy.  Neurological: He is alert and oriented to person, place, and time.  Skin: Skin is warm and dry. No rash noted.  Psychiatric: Affect normal.  Vitals reviewed.   No results found for this or any previous visit (from the past 2160 hour(s)).  Assessment/Plan: No problem-specific Assessment & Plan notes found for this encounter.    Leeanne Rio, PA-C

## 2016-06-05 NOTE — Telephone Encounter (Signed)
Pt was no show 06/04/16 for cpe, pt has not rescheduled, 1st no show w/in 12 months, charge or no charge?

## 2016-06-05 NOTE — Telephone Encounter (Signed)
No charge for 1st no-show 

## 2016-08-11 ENCOUNTER — Ambulatory Visit (INDEPENDENT_AMBULATORY_CARE_PROVIDER_SITE_OTHER): Payer: Medicare PPO | Admitting: Physician Assistant

## 2016-08-11 ENCOUNTER — Encounter: Payer: Self-pay | Admitting: Physician Assistant

## 2016-08-11 VITALS — BP 129/90 | HR 71 | Temp 98.0°F | Resp 16 | Ht 67.0 in | Wt 247.0 lb

## 2016-08-11 DIAGNOSIS — M549 Dorsalgia, unspecified: Secondary | ICD-10-CM | POA: Diagnosis not present

## 2016-08-11 DIAGNOSIS — Z23 Encounter for immunization: Secondary | ICD-10-CM

## 2016-08-11 DIAGNOSIS — G8929 Other chronic pain: Secondary | ICD-10-CM

## 2016-08-11 DIAGNOSIS — I10 Essential (primary) hypertension: Secondary | ICD-10-CM | POA: Diagnosis not present

## 2016-08-11 DIAGNOSIS — R5383 Other fatigue: Secondary | ICD-10-CM

## 2016-08-11 DIAGNOSIS — T85192S Other mechanical complication of implanted electronic neurostimulator (electrode) of spinal cord, sequela: Secondary | ICD-10-CM

## 2016-08-11 LAB — BASIC METABOLIC PANEL
BUN: 15 mg/dL (ref 6–23)
CALCIUM: 9.4 mg/dL (ref 8.4–10.5)
CO2: 25 meq/L (ref 19–32)
CREATININE: 1.01 mg/dL (ref 0.40–1.50)
Chloride: 105 mEq/L (ref 96–112)
GFR: 83.33 mL/min (ref >60.00)
GLUCOSE: 100 mg/dL — AB (ref 70–99)
Potassium: 4.3 mEq/L (ref 3.5–5.1)
Sodium: 137 mEq/L (ref 135–145)

## 2016-08-11 LAB — CBC
HEMATOCRIT: 45.9 % (ref 39.0–52.0)
HEMOGLOBIN: 15.8 g/dL (ref 13.0–17.0)
MCHC: 34.4 g/dL (ref 30.0–36.0)
MCV: 95.2 fl (ref 78.0–100.0)
Platelets: 180 10*3/uL (ref 150.0–400.0)
RBC: 4.82 Mil/uL (ref 4.22–5.81)
RDW: 13.8 % (ref 11.5–15.5)
WBC: 8.4 10*3/uL (ref 4.0–10.5)

## 2016-08-11 LAB — FERRITIN: FERRITIN: 144.3 ng/mL (ref 22.0–322.0)

## 2016-08-11 LAB — HEMOGLOBIN A1C: Hgb A1c MFr Bld: 5.6 % (ref 4.6–6.5)

## 2016-08-11 MED ORDER — MONTELUKAST SODIUM 10 MG PO TABS: 10.0000 mg | ORAL_TABLET | Freq: Every day | ORAL | 5 refills | Status: DC

## 2016-08-11 MED ORDER — TIZANIDINE HCL 2 MG PO TABS: 2.0000 mg | ORAL_TABLET | Freq: Every evening | ORAL | 1 refills | Status: DC | PRN

## 2016-08-11 MED ORDER — GABAPENTIN 300 MG PO CAPS: 600.0000 mg | ORAL_CAPSULE | Freq: Three times a day (TID) | ORAL | 1 refills | Status: DC

## 2016-08-11 NOTE — Progress Notes (Signed)
Pre visit review using our clinic review tool, if applicable. No additional management support is needed unless otherwise documented below in the visit note/SLS  

## 2016-08-11 NOTE — Progress Notes (Signed)
Patient presents to clinic today c/o elevated BP at visits with pain management. Endorses BP at 190/110 and other similar elevations.  Notes significant pain at those times. Does have history of intermittent elevated BP at prior visits. Denies chest pain, headaches. Endorses some malaise/fatigue. Endorses some episode of dizziness. Denies lightheadedness. Is taking pain medications as directed.   Past Medical History:  Diagnosis Date  . Asthma    SEASONAL   . Chronic back pain    Neurostimulator  . Environmental allergies   . Neuropathy ([place])   . Seasonal allergic conjunctivitis     Current Outpatient Prescriptions on File Prior to Visit  Medication Sig Dispense Refill  . cyclobenzaprine (FLEXERIL) 10 MG tablet TAKE 1 TABLET THREE TIMES DAILY AS NEEDED FOR MUSCLE SPASMS. 90 tablet 1  . fluticasone-salmeterol (ADVAIR HFA) 230-21 MCG/ACT inhaler Inhale 2 puffs into the lungs 2 (two) times daily. 3 Inhaler 1  . Multiple Vitamins-Minerals (HM MULTIVITAMIN ADULT GUMMY) CHEW Chew 2 tablets by mouth daily.     . Oxycodone HCl 10 MG TABS Take 1 tablet (10 mg total) by mouth every 4 (four) hours. (Patient taking differently: Take 10 mg by mouth every 6 (six) hours. ) 180 tablet 0  . terbinafine (LAMISIL) 250 MG tablet Take 1 tablet (250 mg total) by mouth daily. 7 tablet 0  . VENTOLIN HFA 108 (90 Base) MCG/ACT inhaler INHALE 2 PUFFS BY MOUTH EVERY 6 HOURS AS NEEDED FOR WHEEZING (Patient taking differently: INHALE 2 PUFFS BY MOUTH EVERY 6 HOURS AS NEEDED FOR WHEEZING OR SOB) 18 g 1   No current facility-administered medications on file prior to visit.     Allergies  Allergen Reactions  . Aspirin Swelling  . Hydrocodone Itching    Family History  Problem Relation Age of Onset  . Diabetes Mother     Living  . Sleep apnea Mother   . Diabetes Father 25    Deceased  . Hypertension Father   . Jaundice Father   . Hemophilia Father   . Alcoholism Father   . Cancer Maternal Grandfather     Throat  . Cancer Paternal Uncle     Throat  . Asthma Sister   . Diabetes Sister   . Healthy Son     X1  . Healthy Daughter     X1    Social History   Social History  . Marital status: Divorced    Spouse name: N/A  . Number of children: N/A  . Years of education: N/A   Social History Main Topics  . Smoking status: Never Smoker  . Smokeless tobacco: Never Used  . Alcohol use 0.0 oz/week     Comment: OCCAS  . Drug use: No  . Sexual activity: Not Asked   Other Topics Concern  . None   Social History Narrative  . None   Review of Systems - See HPI.  All other ROS are negative.  BP 129/90 (BP Location: Left Arm, Patient Position: Sitting, Cuff Size: Large)   Pulse 71   Temp 98 F (36.7 C) (Oral)   Resp 16   Ht 5\' 7"  (1.702 m)   Wt 247 lb (112 kg)   SpO2 95%   BMI 38.69 kg/m   Physical Exam  Constitutional: He is oriented to person, place, and time and well-developed, well-nourished, and in no distress.  HENT:  Head: Normocephalic and atraumatic.  Eyes: Conjunctivae are normal.  Cardiovascular: Normal rate, regular rhythm, normal heart sounds and intact  distal pulses.   Pulmonary/Chest: Effort normal and breath sounds normal. No respiratory distress. He has no wheezes. He has no rales. He exhibits no tenderness.  Neurological: He is alert and oriented to person, place, and time.  Skin: Skin is warm and dry. No rash noted.  Psychiatric: Affect normal.  Vitals reviewed.   No results found for this or any previous visit (from the past 2160 hour(s)).  Assessment/Plan: 1. Chronic back pain greater than 3 months duration Gabapentin refilled. Patient followed by Pain Specialists. Discussed pain as contributor to elevated BP. FU with specialist as scheduled. - gabapentin (NEURONTIN) 300 MG capsule; Take 2 capsules (600 mg total) by mouth 3 (three) times daily.  Dispense: 540 capsule; Refill: 1  4. Essential hypertension, benign BP only with DBP elevation today.  Has prior elevated BP in office. BP measurements at pain specialist are in the stroke and heart attack range. Will start lisinopril 10 mg daily. Supportive and dietary measures reviewed. EKG reveals NSR. FU scheduled. - EKG 12-Lead  5. Fatigue, unspecified type Multifactorial -- pain and HTN contributing. Will check lab panel today. Will work on pain control and BP control. FU scheduled. - CBC - Basic metabolic panel - Hemoglobin A1c - Ferritin   Leeanne Rio, PA-C

## 2016-08-11 NOTE — Patient Instructions (Signed)
Please go to the lab for blood work. I will call you with your results.  Please start the lisinopril daily.  Please be consistent with diet.  Follow-up with me in 1.5 - 2 weeks.  Return sooner if needed.  Speak with your care manager.  I am setting you up for an appointment with Ophthalmology.

## 2016-08-25 ENCOUNTER — Ambulatory Visit: Payer: Medicare PPO | Admitting: Physician Assistant

## 2016-08-25 ENCOUNTER — Telehealth: Payer: Self-pay | Admitting: Physician Assistant

## 2016-08-25 NOTE — Telephone Encounter (Signed)
FYI:  \Patient is stuck in traffic and will not make it to his 10:30 appointment. Rescheduled for Tuesday

## 2016-08-25 NOTE — Telephone Encounter (Signed)
Noted. No charge. 

## 2016-08-27 ENCOUNTER — Telehealth: Payer: Self-pay | Admitting: Physician Assistant

## 2016-08-27 MED ORDER — LISINOPRIL 10 MG PO TABS: 10.0000 mg | ORAL_TABLET | Freq: Every day | ORAL | 1 refills | Status: DC

## 2016-08-27 NOTE — Telephone Encounter (Signed)
°  Relation to PO:[id number] Call back [phone] Pharmacy:[facility] [place]  Reason for call: pt saw [name] on 10/9, states [name] was going to start him on a bp medicine, pt states pharmacy did not receive the rx .

## 2016-08-27 NOTE — Telephone Encounter (Signed)
I have resent to his local [facility]. Please inform patient.

## 2016-09-02 ENCOUNTER — Ambulatory Visit: Payer: Medicare PPO | Admitting: Physician Assistant

## 2016-09-02 DIAGNOSIS — Z0289 Encounter for other administrative examinations: Secondary | ICD-10-CM

## 2016-09-03 ENCOUNTER — Encounter: Payer: Self-pay | Admitting: Physician Assistant

## 2016-10-26 ENCOUNTER — Other Ambulatory Visit: Payer: Self-pay | Admitting: Physician Assistant

## 2016-12-24 ENCOUNTER — Other Ambulatory Visit: Payer: Self-pay | Admitting: Physician Assistant

## 2016-12-24 ENCOUNTER — Encounter: Payer: Self-pay | Admitting: Physician Assistant

## 2016-12-24 ENCOUNTER — Ambulatory Visit (INDEPENDENT_AMBULATORY_CARE_PROVIDER_SITE_OTHER): Payer: Medicare PPO | Admitting: Physician Assistant

## 2016-12-24 VITALS — BP 138/96 | HR 76 | Temp 98.1°F | Resp 16 | Ht 67.0 in | Wt 240.0 lb

## 2016-12-24 DIAGNOSIS — F411 Generalized anxiety disorder: Secondary | ICD-10-CM

## 2016-12-24 DIAGNOSIS — R002 Palpitations: Secondary | ICD-10-CM | POA: Diagnosis not present

## 2016-12-24 DIAGNOSIS — I10 Essential (primary) hypertension: Secondary | ICD-10-CM | POA: Diagnosis not present

## 2016-12-24 LAB — CBC
HEMATOCRIT: 45.2 % (ref 39.0–52.0)
Hemoglobin: 15.5 g/dL (ref 13.0–17.0)
MCHC: 34.2 g/dL (ref 30.0–36.0)
MCV: 96.4 fl (ref 78.0–100.0)
Platelets: 206 10*3/uL (ref 150.0–400.0)
RBC: 4.69 Mil/uL (ref 4.22–5.81)
RDW: 13.4 % (ref 11.5–15.5)
WBC: 7.7 10*3/uL (ref 4.0–10.5)

## 2016-12-24 LAB — BASIC METABOLIC PANEL
BUN: 15 mg/dL (ref 6–23)
CHLORIDE: 103 meq/L (ref 96–112)
CO2: 27 meq/L (ref 19–32)
Calcium: 8.9 mg/dL (ref 8.4–10.5)
Creatinine, Ser: 0.98 mg/dL (ref 0.40–1.50)
GFR: 86.15 mL/min (ref >60.00)
GLUCOSE: 81 mg/dL (ref 70–99)
POTASSIUM: 4.2 meq/L (ref 3.5–5.1)
SODIUM: 138 meq/L (ref 135–145)

## 2016-12-24 LAB — TSH: TSH: 0.88 u[IU]/mL (ref 0.35–4.50)

## 2016-12-24 MED ORDER — LISINOPRIL 20 MG PO TABS: 20.0000 mg | ORAL_TABLET | Freq: Every day | ORAL | 3 refills | Status: DC

## 2016-12-24 MED ORDER — ALPRAZOLAM 0.25 MG PO TABS: 0.2500 mg | ORAL_TABLET | Freq: Two times a day (BID) | ORAL | 0 refills | Status: DC | PRN

## 2016-12-24 MED ORDER — SERTRALINE HCL 25 MG PO TABS: 25.0000 mg | ORAL_TABLET | Freq: Every day | ORAL | 1 refills | Status: DC

## 2016-12-24 NOTE — Patient Instructions (Signed)
Please start the Sertraline taking as directed.   Start the new dose of lisinopril (20 mg) daily.  Follow the diet below (DASH diet).  I want you to follow-up with me in 2 weeks for follow-up and a complete physical.  If you note any chest pain, shortness of breath, lightheadedness, not associated with your anxiety, please go to the ER.  DASH Eating Plan DASH stands for "Dietary Approaches to Stop Hypertension." The DASH eating plan is a healthy eating plan that has been shown to reduce high blood pressure (hypertension). Additional health benefits may include reducing the risk of type 2 diabetes mellitus, heart disease, and stroke. The DASH eating plan may also help with weight loss. What do I need to know about the DASH eating plan? For the DASH eating plan, you will follow these general guidelines:  Choose foods with less than 150 milligrams of sodium per serving (as listed on the food label).  Use salt-free seasonings or herbs instead of table salt or sea salt.  Check with your health care provider or pharmacist before using salt substitutes.  Eat lower-sodium products. These are often labeled as "low-sodium" or "no salt added."  Eat fresh foods. Avoid eating a lot of canned foods.  Eat more vegetables, fruits, and low-fat dairy products.  Choose whole grains. Look for the word "whole" as the first word in the ingredient list.  Choose fish and skinless chicken or [place] more often than red meat. Limit fish, poultry, and meat to 6 oz (170 g) each day.  Limit sweets, desserts, sugars, and sugary drinks.  Choose heart-healthy fats.  Eat more home-cooked food and less restaurant, buffet, and fast food.  Limit fried foods.  Do not fry foods. Cook foods using methods such as baking, boiling, grilling, and broiling instead.  When eating at a restaurant, ask that your food be prepared with less salt, or no salt if possible. What foods can I eat? Seek help from a dietitian for  individual calorie needs. Grains  Whole grain or whole wheat bread. Brown rice. Whole grain or whole wheat pasta. Quinoa, bulgur, and whole grain cereals. Low-sodium cereals. Corn or whole wheat flour tortillas. Whole grain cornbread. Whole grain crackers. Low-sodium crackers. Vegetables  Fresh or frozen vegetables (raw, steamed, roasted, or grilled). Low-sodium or reduced-sodium tomato and vegetable juices. Low-sodium or reduced-sodium tomato sauce and paste. Low-sodium or reduced-sodium canned vegetables. Fruits  All fresh, canned (in natural juice), or frozen fruits. Meat and Other Protein Products  Ground beef (85% or leaner), grass-fed beef, or beef trimmed of fat. Skinless chicken or [place]. Ground chicken or [place]. Pork trimmed of fat. All fish and seafood. Eggs. Dried beans, peas, or lentils. Unsalted nuts and seeds. Unsalted canned beans. Dairy  Low-fat dairy products, such as skim or 1% milk, 2% or reduced-fat cheeses, low-fat ricotta or cottage cheese, or plain low-fat yogurt. Low-sodium or reduced-sodium cheeses. Fats and Oils  Tub margarines without trans fats. Light or reduced-fat mayonnaise and salad dressings (reduced sodium). Avocado. Safflower, olive, or canola oils. Natural peanut or almond butter. Other  Unsalted popcorn and pretzels. The items listed above may not be a complete list of recommended foods or beverages. Contact your dietitian for more options.  What foods are not recommended? Grains  White bread. White pasta. White rice. Refined cornbread. Bagels and croissants. Crackers that contain trans fat. Vegetables  Creamed or fried vegetables. Vegetables in a cheese sauce. Regular canned vegetables. Regular canned tomato sauce and paste. Regular tomato and  vegetable juices. Fruits  Canned fruit in light or heavy syrup. Fruit juice. Meat and Other Protein Products  Fatty cuts of meat. Ribs, chicken wings, bacon, sausage, bologna, salami, chitterlings, fatback, hot  dogs, bratwurst, and packaged luncheon meats. Salted nuts and seeds. Canned beans with salt. Dairy  Whole or 2% milk, cream, half-and-half, and cream cheese. Whole-fat or sweetened yogurt. Full-fat cheeses or blue cheese. Nondairy creamers and whipped toppings. Processed cheese, cheese spreads, or cheese curds. Condiments  Onion and garlic salt, seasoned salt, table salt, and sea salt. Canned and packaged gravies. Worcestershire sauce. Tartar sauce. Barbecue sauce. Teriyaki sauce. Soy sauce, including reduced sodium. Steak sauce. Fish sauce. Oyster sauce. Cocktail sauce. Horseradish. Ketchup and mustard. Meat flavorings and tenderizers. Bouillon cubes. Hot sauce. Tabasco sauce. Marinades. Taco seasonings. Relishes. Fats and Oils  Butter, stick margarine, lard, shortening, ghee, and bacon fat. Coconut, palm kernel, or palm oils. Regular salad dressings. Other  Pickles and olives. Salted popcorn and pretzels. The items listed above may not be a complete list of foods and beverages to avoid. Contact your dietitian for more information.  Where can I find more information? National Heart, Lung, and Blood Institute: travelstabloid.com This information is not intended to replace advice given to you by your health care provider. Make sure you discuss any questions you have with your health care provider. Document Released: 10/09/2011 Document Revised: 03/27/2016 Document Reviewed: 08/24/2013 Elsevier Interactive Patient Education  2017 [employer].

## 2016-12-24 NOTE — Progress Notes (Signed)
Pre visit review using our clinic review tool, if applicable. No additional management support is needed unless otherwise documented below in the visit note. 

## 2016-12-24 NOTE — Progress Notes (Signed)
Patient presents to clinic today for follow-up.  Hypertension: Pt endorses home BP monitoring. Reports that he checks BP in the morning and occasionally in the afternoon when he feels symptomatic. States that BP is usually ranging around 160/110 mmHg. Admits weakness and HA occasionally when he gets more anxious. Checks BP when this happens and reports that SBP is usually elevated into the upper 160s. Additionally states that he is having palpitations when feeling anxious. Endorses taking 10 mg of Lisinopril daily at night. Denies chest pain, abdominal pain, N/V, diarrhea, constipation, hematochezia, melena, fever. Pt additionally reports drinking approximately a 12 pack of beer a week, sometimes more. Has had prior stress testing and heart catheterization 10 years ago due to an inconclusive stress test per patient. Was told everything was normal.  Anxiety Pt states that anxiety has been worsening over the past 2 months. Reports panic attacks at least once or twice a day. Endorses increased stress from financial problems, deaths in the family and from raising a 105 y/o grandchild. States he is not currently taking any medication for anxiety, but feels like a next step is needed. Pt has a history of depression, but states that this is a separate problem. Feels emotions are more anxiety based, states he "constantly feels overwhelmed by everything happening." Denies SI/HI. Reports that in the past he had tried multiple medications for depression, including Wellbutrin and Cymbalta. Denies ever taking Paroxetine or Sertraline.     Past Medical History:  Diagnosis Date  . Asthma    SEASONAL   . Chronic back pain    Neurostimulator  . Environmental allergies   . Neuropathy ([place])   . Seasonal allergic conjunctivitis     Current Outpatient Prescriptions on File Prior to Visit  Medication Sig Dispense Refill  . cyclobenzaprine (FLEXERIL) 10 MG tablet TAKE 1 TABLET THREE TIMES DAILY AS NEEDED FOR  MUSCLE SPASMS. 90 tablet 1  . fluticasone-salmeterol (ADVAIR HFA) 230-21 MCG/ACT inhaler Inhale 2 puffs into the lungs 2 (two) times daily. 3 Inhaler 1  . gabapentin (NEURONTIN) 300 MG capsule Take 2 capsules (600 mg total) by mouth 3 (three) times daily. 540 capsule 1  . montelukast (SINGULAIR) 10 MG tablet Take 1 tablet (10 mg total) by mouth at bedtime. 30 tablet 5  . Multiple Vitamins-Minerals (HM MULTIVITAMIN ADULT GUMMY) CHEW Chew 2 tablets by mouth daily.     . Oxycodone HCl 10 MG TABS Take 1 tablet (10 mg total) by mouth every 4 (four) hours. (Patient taking differently: Take 10 mg by mouth every 6 (six) hours. ) 180 tablet 0  . tiZANidine (ZANAFLEX) 2 MG tablet Take 1-2 tablets (2-4 mg total) by mouth at bedtime as needed for muscle spasms. 135 tablet 1  . VENTOLIN HFA 108 (90 Base) MCG/ACT inhaler INHALE 2 PUFFS BY MOUTH EVERY 6 HOURS AS NEEDED FOR WHEEZING (Patient taking differently: INHALE 2 PUFFS BY MOUTH EVERY 6 HOURS AS NEEDED FOR WHEEZING OR SOB) 18 g 1   No current facility-administered medications on file prior to visit.     Allergies  Allergen Reactions  . Aspirin Swelling  . Hydrocodone Itching    Family History  Problem Relation Age of Onset  . Diabetes Mother     Living  . Sleep apnea Mother   . Diabetes Father 97    Deceased  . Hypertension Father   . Jaundice Father   . Hemophilia Father   . Alcoholism Father   . Cancer Maternal Grandfather  Throat  . Cancer Paternal Uncle     Throat  . Asthma Sister   . Diabetes Sister   . Healthy Son     X1  . Healthy Daughter     X1    Social History   Social History  . Marital status: Divorced    Spouse name: N/A  . Number of children: N/A  . Years of education: N/A   Social History Main Topics  . Smoking status: Never Smoker  . Smokeless tobacco: Never Used  . Alcohol use 0.0 oz/week     Comment: OCCAS  . Drug use: No  . Sexual activity: Not Asked   Other Topics Concern  . None   Social  History Narrative  . None    Review of Systems - See HPI.  All other ROS are negative.  BP (!) 138/96   Pulse 76   Temp 98.1 F (36.7 C) (Oral)   Resp 16   Ht 5\' 7"  (1.702 m)   Wt 240 lb (108.9 kg)   SpO2 98%   BMI 37.59 kg/m   Physical Exam  Constitutional: He is oriented to person, place, and time and well-developed, well-nourished, and in no distress.  HENT:  Head: Normocephalic and atraumatic.  Right Ear: External ear normal.  Left Ear: External ear normal.  Nose: Nose normal.  Mouth/Throat: Oropharynx is clear and moist.  Eyes: Conjunctivae are normal. Pupils are equal, round, and reactive to light.  Neck: Neck supple. No thyromegaly present.  Cardiovascular: Normal rate, regular rhythm, normal heart sounds and intact distal pulses.   Pulmonary/Chest: Effort normal and breath sounds normal.  Neurological: He is alert and oriented to person, place, and time.  Skin: Skin is warm and dry. No rash noted.  Psychiatric: Affect normal.  Vitals reviewed.  Assessment/Plan: 1. Palpitations Seems related to acute anxiety, but needs further assessment. Will check labs today. EKG obtained revealing NSR. No significant changes from prior tracings. Working on BP and anxiety levels. Patient with prior negative heart catheterization. If not improving with anxiety, will need repeat Cardiac assessment and Holter study.  - EKG 12-Lead - CBC - Basic metabolic panel - TSH  2. Essential hypertension BP above goal. Asymptomatic at present. EKG as discussed above. Will increase lisinopril to 20 mg daily. DASH diet encouraged. We are treating anxiety as well. FU scheduled. Alarm signs/symptoms reviewed with patient.   3. Anxiety state Will start Rx Sertraline at low dose. Xanax short-term for acute anxiety while waiting on SSRI to take effect. Lab panel today.    Leeanne Rio, PA-C

## 2016-12-25 ENCOUNTER — Encounter: Payer: Self-pay | Admitting: Emergency Medicine

## 2017-01-07 ENCOUNTER — Encounter: Payer: Self-pay | Admitting: Physician Assistant

## 2017-01-07 ENCOUNTER — Ambulatory Visit (INDEPENDENT_AMBULATORY_CARE_PROVIDER_SITE_OTHER): Payer: Medicare PPO | Admitting: Physician Assistant

## 2017-01-07 VITALS — BP 120/78 | HR 66 | Temp 98.2°F | Resp 14 | Ht 67.0 in | Wt 245.0 lb

## 2017-01-07 DIAGNOSIS — M47816 Spondylosis without myelopathy or radiculopathy, lumbar region: Secondary | ICD-10-CM

## 2017-01-07 DIAGNOSIS — I10 Essential (primary) hypertension: Secondary | ICD-10-CM

## 2017-01-07 DIAGNOSIS — T85192S Other mechanical complication of implanted electronic neurostimulator (electrode) of spinal cord, sequela: Secondary | ICD-10-CM

## 2017-01-07 DIAGNOSIS — Z125 Encounter for screening for malignant neoplasm of prostate: Secondary | ICD-10-CM | POA: Diagnosis not present

## 2017-01-07 DIAGNOSIS — M5010 Cervical disc disorder with radiculopathy, unspecified cervical region: Secondary | ICD-10-CM

## 2017-01-07 DIAGNOSIS — M549 Dorsalgia, unspecified: Secondary | ICD-10-CM | POA: Diagnosis not present

## 2017-01-07 DIAGNOSIS — G8929 Other chronic pain: Secondary | ICD-10-CM

## 2017-01-07 DIAGNOSIS — Z0000 Encounter for general adult medical examination without abnormal findings: Secondary | ICD-10-CM | POA: Diagnosis not present

## 2017-01-07 LAB — CBC
HCT: 46 % (ref 39.0–52.0)
Hemoglobin: 15.7 g/dL (ref 13.0–17.0)
MCHC: 34 g/dL (ref 30.0–36.0)
MCV: 96.9 fl (ref 78.0–100.0)
PLATELETS: 210 10*3/uL (ref 150.0–400.0)
RBC: 4.75 Mil/uL (ref 4.22–5.81)
RDW: 13.9 % (ref 11.5–15.5)
WBC: 7.4 10*3/uL (ref 4.0–10.5)

## 2017-01-07 LAB — LIPID PANEL
CHOL/HDL RATIO: 4
Cholesterol: 237 mg/dL — ABNORMAL HIGH (ref 0–200)
HDL: 54.5 mg/dL (ref >39.00)
LDL Cholesterol: 145 mg/dL — ABNORMAL HIGH (ref 0–99)
NonHDL: 182.46
TRIGLYCERIDES: 186 mg/dL — AB (ref 0.0–149.0)
VLDL: 37.2 mg/dL (ref 0.0–40.0)

## 2017-01-07 LAB — COMPREHENSIVE METABOLIC PANEL
ALBUMIN: 4.3 g/dL (ref 3.5–5.2)
ALK PHOS: 87 U/L (ref 39–117)
ALT: 30 U/L (ref 0–53)
AST: 19 U/L (ref 0–37)
BILIRUBIN TOTAL: 0.5 mg/dL (ref 0.2–1.2)
BUN: 15 mg/dL (ref 6–23)
CALCIUM: 9.8 mg/dL (ref 8.4–10.5)
CO2: 28 mEq/L (ref 19–32)
CREATININE: 0.89 mg/dL (ref 0.40–1.50)
Chloride: 103 mEq/L (ref 96–112)
GFR: 96.26 mL/min (ref >60.00)
Glucose, Bld: 101 mg/dL — ABNORMAL HIGH (ref 70–99)
Potassium: 4.5 mEq/L (ref 3.5–5.1)
Sodium: 138 mEq/L (ref 135–145)
TOTAL PROTEIN: 7.3 g/dL (ref 6.0–8.3)

## 2017-01-07 LAB — PSA: PSA: 3.44 ng/mL (ref 0.10–4.00)

## 2017-01-07 LAB — TSH: TSH: 1.25 u[IU]/mL (ref 0.35–4.50)

## 2017-01-07 LAB — HEMOGLOBIN A1C: HEMOGLOBIN A1C: 5.9 % (ref 4.6–6.5)

## 2017-01-07 MED ORDER — METHYLPREDNISOLONE ACETATE 80 MG/ML IJ SUSP
80.0000 mg | Freq: Once | INTRAMUSCULAR | Status: AC
  Administered 2017-01-07: 40 mg via INTRAMUSCULAR

## 2017-01-07 NOTE — Patient Instructions (Signed)
Please go to the lab for blood work.   Our office will call you with your results unless you have chosen to receive results via MyChart.  If your blood work is normal we will follow-up each year for physicals and as scheduled for chronic medical problems.  If anything is abnormal we will treat accordingly and get you in for a follow-up.  Please follow-up with Neurosurgery tomorrow as scheduled.  Please increase the Sertraline to 50 mg daily (two of your 25 mg) daily.  Follow-up in 1 month.     Preventive Care 40-64 Years, Male Preventive care refers to lifestyle choices and visits with your health care provider that can promote health and wellness. What does preventive care include?  A yearly physical exam. This is also called an annual well check.  Dental exams once or twice a year.  Routine eye exams. Ask your health care provider how often you should have your eyes checked.  Personal lifestyle choices, including:  Daily care of your teeth and gums.  Regular physical activity.  Eating a healthy diet.  Avoiding tobacco and drug use.  Limiting alcohol use.  Practicing safe sex.  Taking low-dose aspirin every day starting at age 67. What happens during an annual well check? The services and screenings done by your health care provider during your annual well check will depend on your age, overall health, lifestyle risk factors, and family history of disease. Counseling  Your health care provider may ask you questions about your:  Alcohol use.  Tobacco use.  Drug use.  Emotional well-being.  Home and relationship well-being.  Sexual activity.  Eating habits.  Work and work Statistician. Screening  You may have the following tests or measurements:  Height, weight, and BMI.  Blood pressure.  Lipid and cholesterol levels. These may be checked every 5 years, or more frequently if you are over 54 years old.  Skin check.  Lung cancer screening. You may  have this screening every year starting at age 80 if you have a 30-pack-year history of smoking and currently smoke or have quit within the past 15 years.  Fecal occult blood test (FOBT) of the stool. You may have this test every year starting at age 102.  Flexible sigmoidoscopy or colonoscopy. You may have a sigmoidoscopy every 5 years or a colonoscopy every 10 years starting at age 39.  Prostate cancer screening. Recommendations will vary depending on your family history and other risks.  Hepatitis C blood test.  Hepatitis B blood test.  Sexually transmitted disease (STD) testing.  Diabetes screening. This is done by checking your blood sugar (glucose) after you have not eaten for a while (fasting). You may have this done every 1-3 years. Discuss your test results, treatment options, and if necessary, the need for more tests with your health care provider. Vaccines  Your health care provider may recommend certain vaccines, such as:  Influenza vaccine. This is recommended every year.  Tetanus, diphtheria, and acellular pertussis (Tdap, Td) vaccine. You may need a Td booster every 10 years.  Varicella vaccine. You may need this if you have not been vaccinated.  Zoster vaccine. You may need this after age 41.  Measles, mumps, and rubella (MMR) vaccine. You may need at least one dose of MMR if you were born in 1957 or later. You may also need a second dose.  Pneumococcal 13-valent conjugate (PCV13) vaccine. You may need this if you have certain conditions and have not been vaccinated.  Pneumococcal  polysaccharide (PPSV23) vaccine. You may need one or two doses if you smoke cigarettes or if you have certain conditions.  Meningococcal vaccine. You may need this if you have certain conditions.  Hepatitis A vaccine. You may need this if you have certain conditions or if you travel or work in places where you may be exposed to hepatitis A.  Hepatitis B vaccine. You may need this if you  have certain conditions or if you travel or work in places where you may be exposed to hepatitis B.  Haemophilus influenzae type b (Hib) vaccine. You may need this if you have certain risk factors. Talk to your health care provider about which screenings and vaccines you need and how often you need them. This information is not intended to replace advice given to you by your health care provider. Make sure you discuss any questions you have with your health care provider. Document Released: 11/16/2015 Document Revised: 07/09/2016 Document Reviewed: 08/21/2015 Elsevier Interactive Patient Education  2017 [employer].

## 2017-01-07 NOTE — Progress Notes (Signed)
Pre visit review using our clinic review tool, if applicable. No additional management support is needed unless otherwise documented below in the visit note. 

## 2017-01-08 ENCOUNTER — Encounter: Payer: Self-pay | Admitting: Physician Assistant

## 2017-01-08 NOTE — Progress Notes (Signed)
Subjective:   Daniel Durham is a 51 y.o. male who presents for Medicare Annual/Subsequent preventive examination.  Patient c/o acute on chronic low back pain, bilateral left-sided sciatica. Endorses symptoms starting after he bent over to help his granddaughter pick up some toys. Also did some raking in the yard. Pain is described as sharp and aching, a 7-8/10 despite chronic medications. Patient is followed by Pain management (Dr. Maryjean Ka) with [facility] neurosurgery. States he has not been set up with an actual Neurosurgeon there. Patient denies change to bowel/bladder habits. Denies saddle anesthesia.   Review of Systems:  Review of Systems  Constitutional: Negative for fever and weight loss.  HENT: Negative for ear discharge, ear pain, hearing loss and tinnitus.   Eyes: Negative for blurred vision, double vision, photophobia and pain.  Respiratory: Negative for cough and shortness of breath.   Cardiovascular: Negative for chest pain and palpitations.  Gastrointestinal: Negative for abdominal pain, blood in stool, constipation, diarrhea, heartburn, melena, nausea and vomiting.  Genitourinary: Negative for dysuria, flank pain, frequency, hematuria and urgency.  Musculoskeletal: Positive for back pain and joint pain. Negative for falls.  Neurological: Negative for dizziness, loss of consciousness and headaches.  Endo/Heme/Allergies: Negative for environmental allergies.  Psychiatric/Behavioral: Negative for depression, hallucinations, substance abuse and suicidal ideas. The patient is not nervous/anxious and does not have insomnia.    Objective:    Vitals: BP 120/78   Pulse 66   Temp 98.2 F (36.8 C) (Oral)   Resp 14   Ht 5\' 7"  (1.702 m)   Wt 245 lb (111.1 kg)   SpO2 97%   BMI 38.37 kg/m   Body mass index is 38.37 kg/m.  Tobacco History  Smoking Status  . Never Smoker  Smokeless Tobacco  . Never Used     Counseling given: Yes   Past Medical History:  Diagnosis Date    . Asthma    SEASONAL   . Chronic back pain    Neurostimulator  . Environmental allergies   . Neuropathy ([place])   . Seasonal allergic conjunctivitis    Past Surgical History:  Procedure Laterality Date  . 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 EF 45% in RAO, 60% LAO (51% stress, 54% rest by NM stress; 57-59% echo 10/2010)  . CARPAL TUNNEL RELEASE     Bilateral  . HAND AMPUTATION  2007  . ROTATOR CUFF REPAIR     Left  . SPINAL CORD STIMULATOR IMPLANT    . SPINAL CORD STIMULATOR REMOVAL N/A 10/26/2015   Procedure: LUMBAR SPINAL CORD STIMULATOR REMOVAL;  Surgeon: Clydell Hakim, MD;  Location: [facility];  Service: Neurosurgery;  Laterality: N/A;   Family History  Problem Relation Age of Onset  . Diabetes Mother     Living  . Sleep apnea Mother   . Diabetes Father 52    Deceased  . Hypertension Father   . Jaundice Father   . Hemophilia Father   . Alcoholism Father   . Cancer Maternal Grandfather     Throat  . Cancer Paternal Uncle     Throat  . Asthma Sister   . Diabetes Sister   . Healthy Son     X1  . Healthy Daughter     X1   History  Sexual Activity  . Sexual activity: Not Currently    Outpatient Encounter Prescriptions as of 01/07/2017  Medication Sig  . ALPRAZolam (XANAX) 0.25 MG tablet Take 1  tablet (0.25 mg total) by mouth 2 (two) times daily as needed for anxiety.  . cyclobenzaprine (FLEXERIL) 10 MG tablet TAKE 1 TABLET THREE TIMES DAILY AS NEEDED FOR MUSCLE SPASMS.  . fluticasone-salmeterol (ADVAIR HFA) 230-21 MCG/ACT inhaler Inhale 2 puffs into the lungs 2 (two) times daily.  Marland Kitchen gabapentin (NEURONTIN) 300 MG capsule Take 2 capsules (600 mg total) by mouth 3 (three) times daily.  Marland Kitchen lisinopril (PRINIVIL,ZESTRIL) 20 MG tablet Take 1 tablet (20 mg total) by mouth daily.  . montelukast (SINGULAIR) 10 MG tablet Take 1 tablet (10 mg total) by mouth at bedtime.  . Multiple Vitamins-Minerals (HM MULTIVITAMIN  ADULT GUMMY) CHEW Chew 2 tablets by mouth daily.   . Oxycodone HCl 10 MG TABS Take 1 tablet (10 mg total) by mouth every 4 (four) hours. (Patient taking differently: Take 10 mg by mouth QID. )  . sertraline (ZOLOFT) 25 MG tablet Take 1 tablet (25 mg total) by mouth daily.  Marland Kitchen tiZANidine (ZANAFLEX) 2 MG tablet Take 1-2 tablets (2-4 mg total) by mouth at bedtime as needed for muscle spasms.  . VENTOLIN HFA 108 (90 Base) MCG/ACT inhaler INHALE 2 PUFFS BY MOUTH EVERY 6 HOURS AS NEEDED FOR WHEEZING (Patient taking differently: INHALE 2 PUFFS BY MOUTH EVERY 6 HOURS AS NEEDED FOR WHEEZING OR SOB)  . [EXPIRED] methylPREDNISolone acetate (DEPO-MEDROL) injection 80 mg    No facility-administered encounter medications on file as of 01/07/2017.     Activities of Daily Living In your present state of health, do you have any difficulty performing the following activities: 01/07/2017 12/24/2016  Hearing? N N  Vision? N N  Difficulty concentrating or making decisions? N N  Walking or climbing stairs? N N  Dressing or bathing? N N  Doing errands, shopping? N N  Preparing Food and eating ? N -  Using the Toilet? N -  In the past six months, have you accidently leaked urine? N -  Do you have problems with loss of bowel control? N -  Managing your Medications? N -  Managing your Finances? N -  Housekeeping or managing your Housekeeping? N -  Some recent data might be hidden   Patient Care Team: Brunetta Jeans, PA-C as PCP - General (Physician Assistant) Clydell Hakim, MD as Consulting Physician (Anesthesiology) Newman Pies, MD as Consulting Physician (Neurosurgery)   Assessment:    (1) Medicare Wellness, Subsequent (2) CPE (3) Acute on Chronic lumbar back pain with sciatica (4) Hypertension   Exercise Activities and Dietary recommendations Current Exercise Habits: The patient does not participate in regular exercise at present  Goals    None     Fall Risk Fall Risk  01/08/2017 06/20/2015    Falls in the past year? Yes Yes  Number falls in past yr: 2 or more 2 or more  Injury with Fall? No Yes  Risk Factor Category  High Fall Risk High Fall Risk  Risk for fall due to : Impaired mobility History of fall(s);Impaired balance/gait;Impaired vision;Impaired mobility;Medication side effect  Follow up Falls evaluation completed;Falls prevention discussed -   Depression Screen PHQ 2/9 Scores 01/08/2017 12/24/2016 06/20/2015 02/04/2015  PHQ - 2 Score 0 1 1 0  PHQ- 9 Score - 5 - -    Cognitive Function MMSE 30/30  Immunization History  Administered Date(s) Administered  . Influenza Split 09/10/2015  . Influenza,inj,Quad PF,36+ Mos 08/11/2016  . Tdap 01/31/2015   Screening Tests Health Maintenance  Topic Date Due  . HIV Screening  04/04/1982  .  DTaP/Tdap/Td (2 - Td) 01/30/2025  . TETANUS/TDAP  01/30/2025  . INFLUENZA VACCINE  Completed      Plan:     (1)  During the course of the visit the patient was educated and counseled about the following appropriate screening and preventive services:   Vaccines to include Pneumoccal, Influenza, Hepatitis B, Td, Zostavax, HCV  Cardiovascular Disease  Colorectal cancer screening  Diabetes screening  Prostate Cancer Screening  Glaucoma screening  Nutrition counseling   Smoking cessation counseling  (2) Depression screen negative. Health Maintenance reviewed -- Flu and Tetanus up-to-date. Declines HIV screening. Preventive schedule discussed and handout given in AVS. Will obtain fasting labs today.  (3) BP much improved. Labs today. Continue current regimen.   (4) IM steroid given today. Continue pain medications. Referral placed again for Neurosurgery. Patient scheduled for assessment tomorrow. Discussed alarm signs/symptoms that would prompt ER assessment. Patient voices understanding and agreement with the plan.   Patient Instructions (the written plan) was given to the patient.    Raiford Noble Curlew,  [place]  01/08/2017

## 2017-01-20 ENCOUNTER — Other Ambulatory Visit: Payer: Self-pay | Admitting: Physician Assistant

## 2017-02-10 ENCOUNTER — Ambulatory Visit: Payer: Medicare PPO | Admitting: Physician Assistant

## 2017-02-11 ENCOUNTER — Telehealth: Payer: Self-pay | Admitting: Physician Assistant

## 2017-02-11 ENCOUNTER — Ambulatory Visit: Payer: Medicare PPO | Admitting: Physician Assistant

## 2017-02-11 NOTE — Telephone Encounter (Signed)
LMOVM advising patient if he received an extension on the DMV form is dated 10/2016 ususally wants completed within 30 days. Will need to schedule an appointment to complete the musculoskeletal portion for ROM or will need Neurosurgery to complete.

## 2017-02-11 NOTE — Telephone Encounter (Signed)
Finished bulk of DMV forms the patient brought in.  There is one section pertaining to MSK disorders that he needs updated exam for -- requires full ROM testing, strength testing, etc that has to be completed. Patient needs to see [place] in office so I can complete this and finish his forms.   Also his forms are dated 10/04/16 and were just given to [place].  The form says it is valid for 30 days from the date it was sent to him.  Had he got an extension granted? If not, he may need a new set of forms.

## 2017-02-13 NOTE — Telephone Encounter (Signed)
Patient has pending appt on 02/16/17 for follow

## 2017-02-14 ENCOUNTER — Other Ambulatory Visit: Payer: Self-pay | Admitting: Physician Assistant

## 2017-02-16 ENCOUNTER — Ambulatory Visit: Payer: Medicare PPO | Admitting: Physician Assistant

## 2017-02-18 ENCOUNTER — Encounter: Payer: Self-pay | Admitting: Physician Assistant

## 2017-02-18 ENCOUNTER — Ambulatory Visit (INDEPENDENT_AMBULATORY_CARE_PROVIDER_SITE_OTHER): Payer: Medicare PPO | Admitting: Physician Assistant

## 2017-02-18 VITALS — BP 120/84 | HR 96 | Temp 98.5°F | Resp 16 | Ht 67.0 in | Wt 246.0 lb

## 2017-02-18 DIAGNOSIS — M47816 Spondylosis without myelopathy or radiculopathy, lumbar region: Secondary | ICD-10-CM

## 2017-02-18 DIAGNOSIS — S68412D Complete traumatic amputation of left hand at wrist level, subsequent encounter: Secondary | ICD-10-CM | POA: Diagnosis not present

## 2017-02-18 DIAGNOSIS — F32A Depression, unspecified: Secondary | ICD-10-CM

## 2017-02-18 DIAGNOSIS — F329 Major depressive disorder, single episode, unspecified: Secondary | ICD-10-CM

## 2017-02-18 DIAGNOSIS — F419 Anxiety disorder, unspecified: Secondary | ICD-10-CM | POA: Diagnosis not present

## 2017-02-18 DIAGNOSIS — S58112D Complete traumatic amputation at level between elbow and wrist, left arm, subsequent encounter: Secondary | ICD-10-CM | POA: Diagnosis not present

## 2017-02-18 NOTE — Patient Instructions (Signed)
Please continue current dose of Sertraline.  I am glad you are doing well. You need to take it easy on yourself - you just had major surgery and it will take time for you to get back to regular activities! Don't overdo it!  Please call your Neurosurgeon to discuss changes in pain medication regimen.  Continue your current medication regimen. Follow-up in 3 months.

## 2017-02-18 NOTE — Progress Notes (Signed)
Pre visit review using our clinic review tool, if applicable. No additional management support is needed unless otherwise documented below in the visit note. 

## 2017-02-19 ENCOUNTER — Telehealth: Payer: Self-pay | Admitting: Physician Assistant

## 2017-02-19 ENCOUNTER — Other Ambulatory Visit: Payer: Self-pay | Admitting: Physician Assistant

## 2017-02-19 NOTE — Telephone Encounter (Signed)
Faxed DMV forms and advised patient by voicemail

## 2017-02-19 NOTE — Telephone Encounter (Signed)
DMV forms completed and signed. Given to CMA. Please call patient to let him know they are complete. Please fax in. Thank you.

## 2017-02-22 NOTE — Progress Notes (Signed)
Patient presents to clinic today for follow-up of anxiety/depression. Patient is currently on Sertraline 25 mg. Endorses taking medication as directed. Is tolerating well without side effect. Has noted improvement in mood with this regimen. Denies SI/HI.   Patient would also like to get a rolling walker with a seat. Patient has substantial Orthopedic and Neurosurgical issues. Is followed by Neurosurgery and pain management. Deals with chronic lower back pain. Sometimes need assistance with ambulation long distances and seat on the walker would allow him somewhere to sit and rest if needed.  Past Medical History:  Diagnosis Date  . Asthma    SEASONAL   . Chronic back pain    Neurostimulator  . Environmental allergies   . Neuropathy   . Seasonal allergic conjunctivitis     Current Outpatient Prescriptions on File Prior to Visit  Medication Sig Dispense Refill  . cyclobenzaprine (FLEXERIL) 10 MG tablet TAKE 1 TABLET THREE TIMES DAILY AS NEEDED FOR MUSCLE SPASMS. 90 tablet 1  . fluticasone-salmeterol (ADVAIR HFA) 230-21 MCG/ACT inhaler Inhale 2 puffs into the lungs 2 (two) times daily. 3 Inhaler 1  . gabapentin (NEURONTIN) 300 MG capsule Take 2 capsules (600 mg total) by mouth 3 (three) times daily. 540 capsule 1  . lisinopril (PRINIVIL,ZESTRIL) 20 MG tablet Take 1 tablet (20 mg total) by mouth daily. 90 tablet 3  . montelukast (SINGULAIR) 10 MG tablet TAKE 1 TABLET(10 MG) BY MOUTH AT BEDTIME 90 tablet 0  . Multiple Vitamins-Minerals (HM MULTIVITAMIN ADULT GUMMY) CHEW Chew 2 tablets by mouth daily.     . Oxycodone HCl 10 MG TABS Take 1 tablet (10 mg total) by mouth every 4 (four) hours. (Patient taking differently: Take 10 mg by mouth QID. ) 180 tablet 0  . sertraline (ZOLOFT) 25 MG tablet TAKE 1 TABLET(25 MG) BY MOUTH DAILY (Patient taking differently: TAKE 2 TABLETS DAILY) 90 tablet 1  . VENTOLIN HFA 108 (90 Base) MCG/ACT inhaler INHALE 2 PUFFS BY MOUTH EVERY 6 HOURS AS NEEDED FOR WHEEZING  18 g 3   No current facility-administered medications on file prior to visit.     Allergies  Allergen Reactions  . Aspirin Swelling  . Hydrocodone Itching    Family History  Problem Relation Age of Onset  . Diabetes Mother     Living  . Sleep apnea Mother   . Diabetes Father 70    Deceased  . Hypertension Father   . Jaundice Father   . Hemophilia Father   . Alcoholism Father   . Cancer Maternal Grandfather     Throat  . Cancer Paternal Uncle     Throat  . Asthma Sister   . Diabetes Sister   . Healthy Son     X1  . Healthy Daughter     X1    Social History   Social History  . Marital status: Divorced    Spouse name: N/A  . Number of children: N/A  . Years of education: N/A   Social History Main Topics  . Smoking status: Never Smoker  . Smokeless tobacco: Never Used  . Alcohol use 0.0 oz/week     Comment: OCCAS  . Drug use: No  . Sexual activity: Not Currently   Other Topics Concern  . None   Social History Narrative  . None   Review of Systems - See HPI.  All other ROS are negative.  BP 120/84   Pulse 96   Temp 98.5 F (36.9 C) (Oral)   Resp  16   Ht 5\' 7"  (1.702 m)   Wt 246 lb (111.6 kg)   SpO2 97%   BMI 38.53 kg/m   Physical Exam  Constitutional: He is oriented to person, place, and time and well-developed, well-nourished, and in no distress.  HENT:  Head: Normocephalic and atraumatic.  Eyes: Conjunctivae are normal.  Neck: Neck supple.  Cardiovascular: Normal rate, regular rhythm, normal heart sounds and intact distal pulses.   Pulmonary/Chest: Effort normal and breath sounds normal. No respiratory distress. He has no wheezes. He has no rales. He exhibits no tenderness.  Musculoskeletal:  Left forearm surgically absent.  Neurological: He is alert and oriented to person, place, and time.  Skin: Skin is warm and dry. No rash noted.  Psychiatric: Affect normal.  Vitals reviewed.  Recent Results (from the past 2160 hour(s))  CBC      Status: None   Collection Time: 12/24/16 12:36 PM  Result Value Ref Range   WBC 7.7 4.0 - 10.5 K/uL   RBC 4.69 4.22 - 5.81 Mil/uL   Platelets 206.0 150.0 - 400.0 K/uL   Hemoglobin 15.5 13.0 - 17.0 g/dL   HCT 45.2 39.0 - 52.0 %   MCV 96.4 78.0 - 100.0 fl   MCHC 34.2 30.0 - 36.0 g/dL   RDW 13.4 11.5 - 87.8 %  Basic metabolic panel     Status: None   Collection Time: 12/24/16 12:36 PM  Result Value Ref Range   Sodium 138 135 - 145 mEq/L   Potassium 4.2 3.5 - 5.1 mEq/L   Chloride 103 96 - 112 mEq/L   CO2 27 19 - 32 mEq/L   Glucose, Bld 81 70 - 99 mg/dL   BUN 15 6 - 23 mg/dL   Creatinine, Ser 0.98 0.40 - 1.50 mg/dL   Calcium 8.9 8.4 - 10.5 mg/dL   GFR 86.15 >60.00 mL/min  TSH     Status: None   Collection Time: 12/24/16 12:36 PM  Result Value Ref Range   TSH 0.88 0.35 - 4.50 uIU/mL  CBC     Status: None   Collection Time: 01/07/17 10:58 AM  Result Value Ref Range   WBC 7.4 4.0 - 10.5 K/uL   RBC 4.75 4.22 - 5.81 Mil/uL   Platelets 210.0 150.0 - 400.0 K/uL   Hemoglobin 15.7 13.0 - 17.0 g/dL   HCT 46.0 39.0 - 52.0 %   MCV 96.9 78.0 - 100.0 fl   MCHC 34.0 30.0 - 36.0 g/dL   RDW 13.9 11.5 - 15.5 %  Lipid panel     Status: Abnormal   Collection Time: 01/07/17 10:58 AM  Result Value Ref Range   Cholesterol 237 (H) 0 - 200 mg/dL    Comment: ATP III Classification       Desirable:  < 200 mg/dL               Borderline High:  200 - 239 mg/dL          High:  > = 240 mg/dL   Triglycerides 186.0 (H) 0.0 - 149.0 mg/dL    Comment: Normal:  <150 mg/dLBorderline High:  150 - 199 mg/dL   HDL 54.50 >39.00 mg/dL   VLDL 37.2 0.0 - 40.0 mg/dL   LDL Cholesterol 145 (H) 0 - 99 mg/dL   Total CHOL/HDL Ratio 4     Comment:                Men  Women1/2 Average Risk     3.4          3.3Average Risk          5.0          4.42X Average Risk          9.6          7.13X Average Risk          15.0          11.0                       NonHDL 182.46     Comment: NOTE:  Non-HDL goal should be 30  mg/dL higher than patient's LDL goal (i.e. LDL goal of < 70 mg/dL, would have non-HDL goal of < 100 mg/dL)  Comprehensive metabolic panel     Status: Abnormal   Collection Time: 01/07/17 10:58 AM  Result Value Ref Range   Sodium 138 135 - 145 mEq/L   Potassium 4.5 3.5 - 5.1 mEq/L   Chloride 103 96 - 112 mEq/L   CO2 28 19 - 32 mEq/L   Glucose, Bld 101 (H) 70 - 99 mg/dL   BUN 15 6 - 23 mg/dL   Creatinine, Ser 0.89 0.40 - 1.50 mg/dL   Total Bilirubin 0.5 0.2 - 1.2 mg/dL   Alkaline Phosphatase 87 39 - 117 U/L   AST 19 0 - 37 U/L   ALT 30 0 - 53 U/L   Total Protein 7.3 6.0 - 8.3 g/dL   Albumin 4.3 3.5 - 5.2 g/dL   Calcium 9.8 8.4 - 10.5 mg/dL   GFR 96.26 >60.00 mL/min  PSA     Status: None   Collection Time: 01/07/17 10:58 AM  Result Value Ref Range   PSA 3.44 0.10 - 4.00 ng/mL  TSH     Status: None   Collection Time: 01/07/17 10:58 AM  Result Value Ref Range   TSH 1.25 0.35 - 4.50 uIU/mL  Hemoglobin A1c     Status: None   Collection Time: 01/07/17 10:58 AM  Result Value Ref Range   Hgb A1c MFr Bld 5.9 4.6 - 6.5 %    Comment: Glycemic Control Guidelines for People with Diabetes:Non Diabetic:  <6%Goal of Therapy: <7%Additional Action Suggested:  >8%    Assessment/Plan: 1. Spondylosis of lumbar region without myelopathy or radiculopathy 2. 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 Patient would benefit from walker with seat for resting. Rx written. Follow-up with Neurosurgery as scheduled.  3. Anxiety and depression Tolerating Sertraline well with some improvement. After discussion of symptoms and expectations, will increase Sertraline to 50 mg daily. Follow-up 1 month.   Leeanne Rio, PA-C

## 2017-03-03 ENCOUNTER — Encounter: Payer: Self-pay | Admitting: Physician Assistant

## 2017-03-03 DIAGNOSIS — F329 Major depressive disorder, single episode, unspecified: Secondary | ICD-10-CM | POA: Insufficient documentation

## 2017-03-03 DIAGNOSIS — F419 Anxiety disorder, unspecified: Secondary | ICD-10-CM

## 2017-03-03 DIAGNOSIS — F32A Depression, unspecified: Secondary | ICD-10-CM | POA: Insufficient documentation

## 2017-03-20 ENCOUNTER — Ambulatory Visit (INDEPENDENT_AMBULATORY_CARE_PROVIDER_SITE_OTHER): Payer: Medicare PPO | Admitting: Physician Assistant

## 2017-03-20 ENCOUNTER — Encounter: Payer: Self-pay | Admitting: Physician Assistant

## 2017-03-20 VITALS — BP 120/88 | HR 76 | Temp 97.9°F | Resp 16 | Ht 67.0 in | Wt 249.0 lb

## 2017-03-20 DIAGNOSIS — N489 Disorder of penis, unspecified: Secondary | ICD-10-CM | POA: Diagnosis not present

## 2017-03-20 DIAGNOSIS — G8929 Other chronic pain: Secondary | ICD-10-CM

## 2017-03-20 DIAGNOSIS — B351 Tinea unguium: Secondary | ICD-10-CM

## 2017-03-20 DIAGNOSIS — M549 Dorsalgia, unspecified: Secondary | ICD-10-CM

## 2017-03-20 DIAGNOSIS — L821 Other seborrheic keratosis: Secondary | ICD-10-CM

## 2017-03-20 DIAGNOSIS — D229 Melanocytic nevi, unspecified: Secondary | ICD-10-CM | POA: Diagnosis not present

## 2017-03-20 NOTE — Progress Notes (Signed)
Patient presents to clinic today discuss multiple issues.   Patient endorses bumps in groin region x 2-3 months. Denies itch. Endorses similar lesion on the penile shaft. Is causing pain to wife during intercourse.   Mole on back -- x several years. Changing in size. No change in color. Denies history of skin cancer.  Patient is 5 weeks out from surgery. Denies improvement in pain since the surgery. Increase in left lower back pain. Fell yesterday and heard popping noise. Dr. Arnoldo Morale -- follow-up in September. Last appointment last Friday. Is doing exercises at home as he cannot afford physical therapy as it is 50.00 per visit.  No follow-up with Dr. Maryjean Ka scheduled.  Past Medical History:  Diagnosis Date  . Asthma    SEASONAL   . Chronic back pain    Neurostimulator  . Environmental allergies   . Neuropathy   . Seasonal allergic conjunctivitis     Current Outpatient Prescriptions on File Prior to Visit  Medication Sig Dispense Refill  . cyclobenzaprine (FLEXERIL) 10 MG tablet TAKE 1 TABLET THREE TIMES DAILY AS NEEDED FOR MUSCLE SPASMS. 90 tablet 1  . fluticasone-salmeterol (ADVAIR HFA) 230-21 MCG/ACT inhaler Inhale 2 puffs into the lungs 2 (two) times daily. 3 Inhaler 1  . gabapentin (NEURONTIN) 300 MG capsule Take 2 capsules (600 mg total) by mouth 3 (three) times daily. 540 capsule 1  . lisinopril (PRINIVIL,ZESTRIL) 20 MG tablet Take 1 tablet (20 mg total) by mouth daily. 90 tablet 3  . montelukast (SINGULAIR) 10 MG tablet TAKE 1 TABLET(10 MG) BY MOUTH AT BEDTIME 90 tablet 0  . Multiple Vitamins-Minerals (HM MULTIVITAMIN ADULT GUMMY) CHEW Chew 2 tablets by mouth daily.     . Oxycodone HCl 10 MG TABS Take 1 tablet (10 mg total) by mouth every 4 (four) hours. (Patient taking differently: Take 10 mg by mouth. Take 1 tablet every 6 hours as needed for pain) 180 tablet 0  . sertraline (ZOLOFT) 25 MG tablet TAKE 1 TABLET(25 MG) BY MOUTH DAILY (Patient taking differently: TAKE 2  TABLETS DAILY) 90 tablet 1  . VENTOLIN HFA 108 (90 Base) MCG/ACT inhaler INHALE 2 PUFFS BY MOUTH EVERY 6 HOURS AS NEEDED FOR WHEEZING 18 g 3   No current facility-administered medications on file prior to visit.     Allergies  Allergen Reactions  . Aspirin Swelling  . Hydrocodone Itching    Family History  Problem Relation Age of Onset  . Diabetes Mother        Living  . Sleep apnea Mother   . Diabetes Father 31       Deceased  . Hypertension Father   . Jaundice Father   . Hemophilia Father   . Alcoholism Father   . Cancer Maternal Grandfather        Throat  . Cancer Paternal Uncle        Throat  . Asthma Sister   . Diabetes Sister   . Healthy Son        X1  . Healthy Daughter        X1    Social History   Social History  . Marital status: Divorced    Spouse name: N/A  . Number of children: N/A  . Years of education: N/A   Social History Main Topics  . Smoking status: Never Smoker  . Smokeless tobacco: Never Used  . Alcohol use 0.0 oz/week     Comment: OCCAS  . Drug use: No  . Sexual activity:  Not Currently   Other Topics Concern  . None   Social History Narrative  . None   Review of Systems - See HPI.  All other ROS are negative.  BP 120/88   Pulse 76   Temp 97.9 F (36.6 C) (Oral)   Resp 16   Ht 5\' 7"  (1.702 m)   Wt 249 lb (112.9 kg)   SpO2 97%   BMI 39.00 kg/m   Physical Exam  Constitutional: He is oriented to person, place, and time and well-developed, well-nourished, and in no distress.  HENT:  Head: Normocephalic and atraumatic.  Eyes: Conjunctivae are normal.  Cardiovascular: Normal rate, regular rhythm, normal heart sounds and intact distal pulses.   Pulmonary/Chest: Effort normal and breath sounds normal. No respiratory distress. He has no wheezes. He has no rales.  Genitourinary: Testes/scrotum normal. Penis exhibits lesions.  Genitourinary Comments: Verrucous lesion noted of ventral penile shaft.   Neurological: He is alert and  oriented to person, place, and time.  Skin: Skin is warm and dry.     Psychiatric: Affect normal.  Vitals reviewed.   Recent Results (from the past 2160 hour(s))  CBC     Status: None   Collection Time: 12/24/16 12:36 PM  Result Value Ref Range   WBC 7.7 4.0 - 10.5 K/uL   RBC 4.69 4.22 - 5.81 Mil/uL   Platelets 206.0 150.0 - 400.0 K/uL   Hemoglobin 15.5 13.0 - 17.0 g/dL   HCT 45.2 39.0 - 52.0 %   MCV 96.4 78.0 - 100.0 fl   MCHC 34.2 30.0 - 36.0 g/dL   RDW 13.4 11.5 - 85.0 %  Basic metabolic panel     Status: None   Collection Time: 12/24/16 12:36 PM  Result Value Ref Range   Sodium 138 135 - 145 mEq/L   Potassium 4.2 3.5 - 5.1 mEq/L   Chloride 103 96 - 112 mEq/L   CO2 27 19 - 32 mEq/L   Glucose, Bld 81 70 - 99 mg/dL   BUN 15 6 - 23 mg/dL   Creatinine, Ser 0.98 0.40 - 1.50 mg/dL   Calcium 8.9 8.4 - 10.5 mg/dL   GFR 86.15 >60.00 mL/min  TSH     Status: None   Collection Time: 12/24/16 12:36 PM  Result Value Ref Range   TSH 0.88 0.35 - 4.50 uIU/mL  CBC     Status: None   Collection Time: 01/07/17 10:58 AM  Result Value Ref Range   WBC 7.4 4.0 - 10.5 K/uL   RBC 4.75 4.22 - 5.81 Mil/uL   Platelets 210.0 150.0 - 400.0 K/uL   Hemoglobin 15.7 13.0 - 17.0 g/dL   HCT 46.0 39.0 - 52.0 %   MCV 96.9 78.0 - 100.0 fl   MCHC 34.0 30.0 - 36.0 g/dL   RDW 13.9 11.5 - 15.5 %  Lipid panel     Status: Abnormal   Collection Time: 01/07/17 10:58 AM  Result Value Ref Range   Cholesterol 237 (H) 0 - 200 mg/dL    Comment: ATP III Classification       Desirable:  < 200 mg/dL               Borderline High:  200 - 239 mg/dL          High:  > = 240 mg/dL   Triglycerides 186.0 (H) 0.0 - 149.0 mg/dL    Comment: Normal:  <150 mg/dLBorderline High:  150 - 199 mg/dL   HDL 54.50 >39.00 mg/dL  VLDL 37.2 0.0 - 40.0 mg/dL   LDL Cholesterol 145 (H) 0 - 99 mg/dL   Total CHOL/HDL Ratio 4     Comment:                Men          Women1/2 Average Risk     3.4          3.3Average Risk          5.0           4.42X Average Risk          9.6          7.13X Average Risk          15.0          11.0                       NonHDL 182.46     Comment: NOTE:  Non-HDL goal should be 30 mg/dL higher than patient's LDL goal (i.e. LDL goal of < 70 mg/dL, would have non-HDL goal of < 100 mg/dL)  Comprehensive metabolic panel     Status: Abnormal   Collection Time: 01/07/17 10:58 AM  Result Value Ref Range   Sodium 138 135 - 145 mEq/L   Potassium 4.5 3.5 - 5.1 mEq/L   Chloride 103 96 - 112 mEq/L   CO2 28 19 - 32 mEq/L   Glucose, Bld 101 (H) 70 - 99 mg/dL   BUN 15 6 - 23 mg/dL   Creatinine, Ser 0.89 0.40 - 1.50 mg/dL   Total Bilirubin 0.5 0.2 - 1.2 mg/dL   Alkaline Phosphatase 87 39 - 117 U/L   AST 19 0 - 37 U/L   ALT 30 0 - 53 U/L   Total Protein 7.3 6.0 - 8.3 g/dL   Albumin 4.3 3.5 - 5.2 g/dL   Calcium 9.8 8.4 - 10.5 mg/dL   GFR 96.26 >60.00 mL/min  PSA     Status: None   Collection Time: 01/07/17 10:58 AM  Result Value Ref Range   PSA 3.44 0.10 - 4.00 ng/mL  TSH     Status: None   Collection Time: 01/07/17 10:58 AM  Result Value Ref Range   TSH 1.25 0.35 - 4.50 uIU/mL  Hemoglobin A1c     Status: None   Collection Time: 01/07/17 10:58 AM  Result Value Ref Range   Hgb A1c MFr Bld 5.9 4.6 - 6.5 %    Comment: Glycemic Control Guidelines for People with Diabetes:Non Diabetic:  <6%Goal of Therapy: <7%Additional Action Suggested:  >8%     Assessment/Plan: 1. Penile lesion Referral to Dermatology giving wart-like characteristics. Refrain from intercourse until assessment. - Ambulatory referral to Dermatology  2. Multiple nevi Comprehensive skin examination with Dermatology. Referral placed. - Ambulatory referral to Dermatology  3. Seborrheic keratoses - Ambulatory referral to Dermatology  4. Onychomycosis Needs routine nail care. Referral to podiatry placed for nail care and to discuss treatment. Supportive measures reviewed with patient.  - Ambulatory referral to Podiatry  5. Chronic  back pain greater than 3 months duration Patient has pain contract with specialist. He is to call and speak with them about refills of pain medication. He is scheduled for injections with them as well.     Leeanne Rio, PA-C

## 2017-03-20 NOTE — Progress Notes (Signed)
Pre visit review using our clinic review tool, if applicable. No additional management support is needed unless otherwise documented below in the visit note. 

## 2017-03-20 NOTE — Patient Instructions (Addendum)
You will be contacted for assessment by Podiatry and Dermatology.  Please reach out to the Pain Management specialist since they are wanting to proceed with injections. We do not want to accidentally get you kicked out with them by refilling a pain medication.

## 2017-04-07 ENCOUNTER — Ambulatory Visit: Payer: Medicare PPO | Admitting: Podiatry

## 2017-04-14 ENCOUNTER — Ambulatory Visit: Payer: Medicare PPO | Admitting: Podiatry

## 2017-05-12 ENCOUNTER — Encounter (HOSPITAL_BASED_OUTPATIENT_CLINIC_OR_DEPARTMENT_OTHER): Payer: Self-pay | Admitting: *Deleted

## 2017-05-12 ENCOUNTER — Ambulatory Visit: Payer: Medicare PPO | Admitting: Podiatry

## 2017-05-12 ENCOUNTER — Emergency Department (HOSPITAL_BASED_OUTPATIENT_CLINIC_OR_DEPARTMENT_OTHER)
Admission: EM | Admit: 2017-05-12 | Discharge: 2017-05-12 | Disposition: A | Discharge status: Home / Self Care | Payer: Medicare PPO | Attending: Emergency Medicine | Admitting: Emergency Medicine

## 2017-05-12 ENCOUNTER — Emergency Department (HOSPITAL_BASED_OUTPATIENT_CLINIC_OR_DEPARTMENT_OTHER): Payer: Medicare PPO

## 2017-05-12 DIAGNOSIS — R51 Headache: Secondary | ICD-10-CM | POA: Insufficient documentation

## 2017-05-12 DIAGNOSIS — J45909 Unspecified asthma, uncomplicated: Secondary | ICD-10-CM | POA: Insufficient documentation

## 2017-05-12 DIAGNOSIS — Z79899 Other long term (current) drug therapy: Secondary | ICD-10-CM | POA: Insufficient documentation

## 2017-05-12 DIAGNOSIS — I10 Essential (primary) hypertension: Secondary | ICD-10-CM | POA: Diagnosis not present

## 2017-05-12 DIAGNOSIS — R42 Dizziness and giddiness: Secondary | ICD-10-CM

## 2017-05-12 DIAGNOSIS — R519 Headache, unspecified: Secondary | ICD-10-CM

## 2017-05-12 HISTORY — DX: Depression, unspecified: F32.A

## 2017-05-12 HISTORY — DX: Essential (primary) hypertension: I10

## 2017-05-12 HISTORY — DX: Major depressive disorder, single episode, unspecified: F32.9

## 2017-05-12 LAB — CBC
HCT: 48.8 % (ref 39.0–52.0)
Hemoglobin: 17.4 g/dL — ABNORMAL HIGH (ref 13.0–17.0)
MCH: 33.7 pg (ref 26.0–34.0)
MCHC: 35.7 g/dL (ref 30.0–36.0)
MCV: 94.6 fL (ref 78.0–100.0)
PLATELETS: 219 10*3/uL (ref 150–400)
RBC: 5.16 MIL/uL (ref 4.22–5.81)
RDW: 12.8 % (ref 11.5–15.5)
WBC: 8.6 10*3/uL (ref 4.0–10.5)

## 2017-05-12 LAB — BASIC METABOLIC PANEL
Anion gap: 13 (ref 5–15)
BUN: 10 mg/dL (ref 6–20)
CALCIUM: 9.6 mg/dL (ref 8.9–10.3)
CO2: 22 mmol/L (ref 22–32)
CREATININE: 0.92 mg/dL (ref 0.61–1.24)
Chloride: 102 mmol/L (ref 101–111)
GFR calc Af Amer: >60 mL/min (ref >60)
GLUCOSE: 91 mg/dL (ref 65–99)
POTASSIUM: 4.3 mmol/L (ref 3.5–5.1)
SODIUM: 137 mmol/L (ref 135–145)

## 2017-05-12 LAB — URINALYSIS, ROUTINE W REFLEX MICROSCOPIC
BILIRUBIN URINE: NEGATIVE
Glucose, UA: NEGATIVE mg/dL
Hgb urine dipstick: NEGATIVE
KETONES UR: 40 mg/dL — AB
LEUKOCYTES UA: NEGATIVE
NITRITE: NEGATIVE
PH: 5 (ref 5.0–8.0)
Protein, ur: NEGATIVE mg/dL
Specific Gravity, Urine: 1.022 (ref 1.005–1.030)

## 2017-05-12 LAB — HEPATIC FUNCTION PANEL
ALT: 40 U/L (ref 17–63)
AST: 34 U/L (ref 15–41)
Albumin: 4.5 g/dL (ref 3.5–5.0)
Alkaline Phosphatase: 81 U/L (ref 38–126)
BILIRUBIN DIRECT: 0.1 mg/dL (ref 0.1–0.5)
Indirect Bilirubin: 0.4 mg/dL (ref 0.3–0.9)
TOTAL PROTEIN: 8.2 g/dL — AB (ref 6.5–8.1)
Total Bilirubin: 0.5 mg/dL (ref 0.3–1.2)

## 2017-05-12 LAB — ETHANOL

## 2017-05-12 LAB — CK: Total CK: 173 U/L (ref 49–397)

## 2017-05-12 LAB — TROPONIN I: Troponin I: <0.03 ng/mL (ref <0.03)

## 2017-05-12 LAB — I-STAT CG4 LACTIC ACID, ED: LACTIC ACID, VENOUS: 0.96 mmol/L (ref 0.5–1.9)

## 2017-05-12 MED ORDER — MECLIZINE HCL 25 MG PO TABS
25.0000 mg | ORAL_TABLET | Freq: Once | ORAL | Status: AC
  Administered 2017-05-12: 25 mg via ORAL
  Filled 2017-05-12: qty 1

## 2017-05-12 MED ORDER — ACETAMINOPHEN 500 MG PO TABS
1000.0000 mg | ORAL_TABLET | Freq: Once | ORAL | Status: AC
  Administered 2017-05-12: 1000 mg via ORAL
  Filled 2017-05-12: qty 2

## 2017-05-12 MED ORDER — LISINOPRIL 10 MG PO TABS
10.0000 mg | ORAL_TABLET | Freq: Once | ORAL | Status: AC
  Administered 2017-05-12: 10 mg via ORAL
  Filled 2017-05-12: qty 1

## 2017-05-12 MED ORDER — SODIUM CHLORIDE 0.9 % IV BOLUS (SEPSIS)
500.0000 mL | Freq: Once | INTRAVENOUS | Status: AC
  Administered 2017-05-12: 500 mL via INTRAVENOUS

## 2017-05-12 MED ORDER — ONDANSETRON HCL 4 MG/2ML IJ SOLN
4.0000 mg | Freq: Once | INTRAMUSCULAR | Status: AC
  Administered 2017-05-12: 4 mg via INTRAVENOUS
  Filled 2017-05-12: qty 2

## 2017-05-12 MED ORDER — OXYCODONE HCL 5 MG PO TABS
10.0000 mg | ORAL_TABLET | Freq: Once | ORAL | Status: AC
  Administered 2017-05-12: 10 mg via ORAL
  Filled 2017-05-12: qty 2

## 2017-05-12 MED ORDER — MECLIZINE HCL 25 MG PO TABS: 25.0000 mg | ORAL_TABLET | Freq: Three times a day (TID) | ORAL | 0 refills | Status: DC | PRN

## 2017-05-12 NOTE — Discharge Instructions (Signed)
Return to the emergency department if your symptoms worsen or change. See your family doctor for recheck this week.

## 2017-05-12 NOTE — ED Notes (Signed)
Pt reports not haven taken his 10 mg of Oxycodone bc he has been here since 1500. Reports that the dizziness has resolved. Requesting pain meds. Pt states his wife will drive him home.

## 2017-05-12 NOTE — ED Notes (Signed)
Pt verbalized understanding of discharge instructions and denies any further questions at this time.   

## 2017-05-12 NOTE — ED Triage Notes (Signed)
Sent from PMD office for eval of dizziness and increased bp x 1 day

## 2017-05-12 NOTE — ED Provider Notes (Signed)
[facility] DEPT [facility] Provider Note   CSN: [id number] Arrival date & time: 05/12/17  1531     History   Chief Complaint Chief Complaint  Patient presents with  . Dizziness    HPI Daniel Durham is a 51 y.o. male.  HPI Patient reports that he went to bed feeling well. He awakened this morning and had dizziness upon getting out of bed. It had some motion and spinning quality to it. Shortly after awakening he started to rather quickly developed a bad headache. Headache was more right-sided. He took some ibuprofen. Patient reports he felt a little nauseated but did not have any vomiting. He denies any change in vision. He denies he had focal weakness tingling or dysfunction of an extremity. Patient went to a podiatry appointment today that it already been scheduled. He made them aware of his dizziness and headache and he was referred to the emergency department. Ostensibly there was also concern for his blood pressure. The patient's wife reports his blood pressure was in the 130s over 90s and was not elevated above his baseline. Patient reports that the headache actually did get better and now he will qualify it as mild. Versed dizziness is less but still slightly present with movement of his head. Patient has hypertension for which he takes lisinopril. He did not take his a.m. dose of lisinopril. Past Medical History:  Diagnosis Date  . Asthma    SEASONAL   . Chronic back pain    Neurostimulator  . Depression   . Environmental allergies   . Hypertension   . Neuropathy   . Seasonal allergic conjunctivitis     Patient Active Problem List   Diagnosis Date Noted  . Anxiety and depression 03/03/2017  . Cervical disc disorder with radiculopathy of cervical region 07/24/2015  . 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 Exacerbation of chronic back pain 03/20/2015  . 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 Visit for preventive  health examination 02/04/2015  . Chronic back pain greater than 3 months duration 01/23/2015  . 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 Amputation stump complication ([place]) [id number]/1191    Past Surgical History:  Procedure Laterality Date  . CARDIAC CATHETERIZATION     11/05/10 [facility], [place]): [place] of mLAD, no sign disease. EF 45% in RAO, 60% LAO (51% stress, 54% rest by NM stress; 57-59% echo 10/2010)  . CARPAL TUNNEL RELEASE     Bilateral  . HAND AMPUTATION  2007  . ROTATOR CUFF REPAIR     Left  . SPINAL CORD STIMULATOR IMPLANT    . SPINAL CORD STIMULATOR REMOVAL N/A 10/26/2015   Procedure: LUMBAR SPINAL CORD STIMULATOR REMOVAL;  Surgeon: Clydell Hakim, MD;  Location: [facility];  Service: Neurosurgery;  Laterality: N/A;       Home Medications    Prior to Admission medications   Medication Sig Start Date End Date Taking? Authorizing Provider  naproxen (NAPROSYN) 500 MG tablet Take 500 mg by mouth 2 (two) times daily with a meal.   Yes [provider]  cyclobenzaprine (FLEXERIL) 10 MG tablet TAKE 1 TABLET THREE TIMES DAILY AS NEEDED FOR MUSCLE SPASMS. 08/29/15   Brunetta Jeans, PA-C  fluticasone-salmeterol (ADVAIR HFA) [phone] MCG/ACT inhaler Inhale 2 puffs into the lungs 2 (two) times daily. 01/31/15   Brunetta Jeans, PA-C  gabapentin (NEURONTIN) 300 MG capsule Take 2 capsules (600 mg total) by mouth 3 (three) times daily. 08/11/16  Brunetta Jeans, PA-C  lisinopril (PRINIVIL,ZESTRIL) 20 MG tablet Take 1 tablet (20 mg total) by mouth daily. 12/24/16   Brunetta Jeans, PA-C  meclizine (ANTIVERT) 25 MG tablet Take 1 tablet (25 mg total) by mouth 3 (three) times daily as needed for dizziness. 05/12/17   Charlesetta Shanks, MD  montelukast (SINGULAIR) 10 MG tablet TAKE 1 TABLET(10 MG) BY MOUTH AT BEDTIME 02/16/17   Brunetta Jeans, PA-C  Multiple Vitamins-Minerals (HM MULTIVITAMIN ADULT GUMMY) CHEW Chew 2 tablets by mouth daily.     [provider]  Oxycodone HCl 10 MG TABS Take 1 tablet (10 mg total) by mouth every 4 (four) hours. Patient taking differently: Take 10 mg by mouth. Take 1 tablet every 6 hours as needed for pain 08/10/15   Raiford Noble C, PA-C  sertraline (ZOLOFT) 25 MG tablet TAKE 1 TABLET(25 MG) BY MOUTH DAILY Patient taking differently: TAKE 2 TABLETS DAILY 01/20/17   Brunetta Jeans, PA-C  VENTOLIN HFA 108 [phone] Base) MCG/ACT inhaler INHALE 2 PUFFS BY MOUTH EVERY 6 HOURS AS NEEDED FOR WHEEZING 02/16/17   Brunetta Jeans, PA-C    Family History Family History  Problem Relation Age of Onset  . Diabetes Mother        Living  . Sleep apnea Mother   . Diabetes Father 69       Deceased  . Hypertension Father   . Jaundice Father   . Hemophilia Father   . Alcoholism Father   . Cancer Maternal Grandfather        Throat  . Cancer Paternal Uncle        Throat  . Asthma Sister   . Diabetes Sister   . Healthy Son        X1  . Healthy Daughter        X1    Social History Social History  Substance Use Topics  . Smoking status: Never Smoker  . Smokeless tobacco: Never Used  . Alcohol use 0.0 oz/week     Comment: OCCAS     Allergies   Aspirin and Hydrocodone   Review of Systems Review of Systems 10 Systems reviewed and are negative for acute change except as noted in the HPI.   Physical Exam Updated Vital Signs BP 122/67   Pulse 82   Temp 98 F (36.7 C) (Oral)   Resp 17   Ht 5\' 7"  (1.702 m)   Wt 109.8 kg (242 lb)   SpO2 92%   BMI 37.90 kg/m   Physical Exam  Constitutional: He appears well-developed and well-nourished.  HENT:  Head: Normocephalic and atraumatic.  Right Ear: External ear normal.  Left Ear: External ear normal.  Nose: Nose normal.  Mouth/Throat: Oropharynx is clear and moist.  Bilateral TMs normal.  Eyes: Conjunctivae and EOM are normal. Pupils are equal, round, and reactive to light.  No nystagmus  Neck: Neck supple.  Cardiovascular: Normal rate and  regular rhythm.   No murmur heard. Pulmonary/Chest: Effort normal and breath sounds normal. No respiratory distress.  Abdominal: Soft. There is no tenderness.  Musculoskeletal: Normal range of motion. He exhibits no edema or tenderness.  Patient has well-healed amputation of left lower arm. This is old traumatic injury. No edema of lower extremities. Chronic thickening of the nails and the soles of his feet (this is what he was seeking treatment for a podiatrist, he reports he's had this for 20+ years)  Neurological: He is alert.  Skin: Skin is warm  and dry.  Psychiatric: He has a normal mood and affect.  Nursing note and vitals reviewed.    ED Treatments / Results  Labs (all labs ordered are listed, but only abnormal results are displayed) Labs Reviewed  CBC - Abnormal; Notable for the following:       Result Value   Hemoglobin 17.4 (*)    All other components within normal limits  URINALYSIS, ROUTINE W REFLEX MICROSCOPIC - Abnormal; Notable for the following:    Ketones, ur 40 (*)    All other components within normal limits  HEPATIC FUNCTION PANEL - Abnormal; Notable for the following:    Total Protein 8.2 (*)    All other components within normal limits  BASIC METABOLIC PANEL  CK  ETHANOL  TROPONIN I  I-STAT CG4 LACTIC ACID, ED    EKG  EKG Interpretation  Date/Time:  Tuesday May 12 2017 16:24:29 EDT Ventricular Rate:  69 PR Interval:    QRS Duration: 103 QT Interval:  385 QTC Calculation: 413 R Axis:   139 Text Interpretation:  Sinus rhythm Left posterior fascicular block Low voltage, precordial leads Consider anterior infarct Baseline wander in lead(s) V6 agree. no old comparison Confirmed by Charlesetta Shanks [phone]) on 05/12/2017 5:34:57 PM       Radiology Ct Head Wo Contrast  Result Date: 05/12/2017 CLINICAL DATA:  Dizzy with migraine frontal headache all day EXAM: CT HEAD WITHOUT CONTRAST TECHNIQUE: Contiguous axial images were obtained from the base of the  skull through the vertex without intravenous contrast. COMPARISON:  None. FINDINGS: Brain: No evidence of acute infarction, hemorrhage, hydrocephalus, extra-axial collection or mass lesion/mass effect. Vascular: No hyperdense vessel or unexpected calcification. Skull: Normal. Negative for fracture or focal lesion. Sinuses/Orbits: No acute finding. Other: None IMPRESSION: No CT evidence for acute intracranial abnormality. Electronically Signed   By: Donavan Foil M.D.   On: 05/12/2017 18:07    Procedures Procedures (including critical care time)  Medications Ordered in ED Medications  ondansetron (ZOFRAN) injection 4 mg (4 mg Intravenous Given 05/12/17 1811)  meclizine (ANTIVERT) tablet 25 mg (25 mg Oral Given 05/12/17 1805)  acetaminophen (TYLENOL) tablet 1,000 mg (1,000 mg Oral Given 05/12/17 1805)  lisinopril (PRINIVIL,ZESTRIL) tablet 10 mg (10 mg Oral Given 05/12/17 1805)  sodium chloride 0.9 % bolus 500 mL (0 mLs Intravenous Stopped 05/12/17 1924)  oxyCODONE (Oxy IR/ROXICODONE) immediate release tablet 10 mg (10 mg Oral Given 05/12/17 2008)     Initial Impression / Assessment and Plan / ED Course  I have reviewed the triage vital signs and the nursing notes.  Pertinent labs & imaging results that were available during my care of the patient were reviewed by me and considered in my medical decision making (see chart for details).    Recheck: Patient much improved. Headache resolved. Vital signs stable. No evidence of hypertension  Final Clinical Impressions(s) / ED Diagnoses   Final diagnoses:  Vertigo  Acute nonintractable headache, unspecified headache type  Reportedly, there were concerns this afternoon the patient was seen at podiatry and reported to them that he had experienced dizziness and headache. Per the patient's wife his blood pressures in the office were in the 130s over 90s. Reportedly there was concern during this visit of the patient's elevated blood pressure in conjunction  with the symptoms and he was counseled to seek treatment. At the time of arrival patient's symptoms had significantly improved. I did not suspect hypertensive emergency or urgency. Reported blood pressure was not significantly elevated and while  in the emergency department the patient had controlled blood pressures. He did report that he took his lisinopril later in the day and thus pressures ostensibly were reading higher in the morning. He describes symptoms that did sound vertiginous in quality. Patient was given Antivert and further evaluated with CT head did not show any acute findings. Upon reassessment patient was well with normal neurologic exam and vertigo symptomatically improved. At this time I do feel he is stable for discharge with precautionary return instructions reviewed. Patient counseled for close outpatient follow-up.  New Prescriptions New Prescriptions   MECLIZINE (ANTIVERT) 25 MG TABLET    Take 1 tablet (25 mg total) by mouth 3 (three) times daily as needed for dizziness.     Charlesetta Shanks, MD 05/20/17 [phone]

## 2017-05-12 NOTE — ED Notes (Signed)
Patient transported to CT 

## 2017-05-19 ENCOUNTER — Ambulatory Visit (INDEPENDENT_AMBULATORY_CARE_PROVIDER_SITE_OTHER): Payer: Medicare PPO | Admitting: Podiatry

## 2017-05-19 ENCOUNTER — Encounter: Payer: Self-pay | Admitting: Podiatry

## 2017-05-19 DIAGNOSIS — M79674 Pain in right toe(s): Secondary | ICD-10-CM

## 2017-05-19 DIAGNOSIS — B351 Tinea unguium: Secondary | ICD-10-CM

## 2017-05-19 DIAGNOSIS — R21 Rash and other nonspecific skin eruption: Secondary | ICD-10-CM

## 2017-05-19 DIAGNOSIS — M79675 Pain in left toe(s): Secondary | ICD-10-CM

## 2017-05-19 MED ORDER — TRIAMCINOLONE ACETONIDE 0.1 % EX CREA: 1.0000 "application " | TOPICAL_CREAM | Freq: Two times a day (BID) | CUTANEOUS | 0 refills | Status: DC

## 2017-05-19 NOTE — Progress Notes (Signed)
Subjective:    Patient ID: Daniel Durham, male   DOB: 51 y.o.   MRN: [id number]   HPI  51 year old male presents the office today for concerns of toenail fungus, thickening of his toenails which is ongoing for several years. He states the nails to get painful at times of pressure in shoe. Denies any redness or drainage from the toenail sites. Over week ago he did drop some that on his big toe has some bleeding to the toenail but this is of sided. Denies any swelling or drainage from the toenail site. He also has a rash to his legs that is rather his been ongoing for some time he is been no recent treatment for. It does not itch. He has no other concerns today.  Review of Systems  All other systems reviewed and are negative.       Objective:  Physical Exam General: AAO x3, NAD  Dermatological: Nails are hypertrophic, dystrophic, brittle, discolored, elongated 10. No surrounding redness or drainage. Tenderness nails 1-5 bilaterally. Dry skin present bilateral plantar heels. Along bilateral legs is erythematous rash present with a scaly center. Appears to be a psoriasis or some a type rash. No open lesions or pre-ulcerative lesions are identified today.   Vascular: Dorsalis Pedis artery and Posterior Tibial artery pedal pulses are 2/4 bilateral with immedate capillary fill time.  There is no pain with calf compression, swelling, warmth, erythema.   Neruologic: Grossly intact via light touch bilateral. Vibratory intact via tuning fork bilateral. Protective threshold with Semmes Wienstein monofilament intact to all pedal sites bilateral.   Musculoskeletal: No gross boney pedal deformities bilateral. No pain, crepitus, or limitation noted with foot and ankle range of motion bilateral. Muscular strength 5/5 in all groups tested bilateral.  Gait: Unassisted, Nonantalgic.      Assessment:     Symptomatic onychomycosis; skin rash    Plan:     -Treatment options discussed including all  alternatives, risks, and complications -Etiology of symptoms were discussed -Nail sharply debrided 10 without, occasions or bleeding. The nails are sent for culture/biopsy and the were sent to [place] for rash on his legs. -Daily foot inspection  -Moisturizer to the heels -RTC 4 weeks or sooner if needed.  Annice Needy, DPM

## 2017-05-20 ENCOUNTER — Ambulatory Visit: Payer: Medicare PPO | Admitting: Physician Assistant

## 2017-05-20 NOTE — Addendum Note (Signed)
Addended by: Cranford Mon R on: 05/20/2017 07:56 AM   Modules accepted: Orders

## 2017-06-16 ENCOUNTER — Ambulatory Visit: Payer: Medicare PPO | Admitting: Podiatry

## 2017-07-03 ENCOUNTER — Encounter: Payer: Self-pay | Admitting: Medical

## 2017-07-03 ENCOUNTER — Ambulatory Visit (INDEPENDENT_AMBULATORY_CARE_PROVIDER_SITE_OTHER): Payer: Medicare PPO | Admitting: Medical

## 2017-07-03 VITALS — BP 134/79 | HR 77 | Temp 97.6°F | Ht 67.0 in | Wt 248.8 lb

## 2017-07-03 DIAGNOSIS — T7840XA Allergy, unspecified, initial encounter: Secondary | ICD-10-CM

## 2017-07-03 MED ORDER — PREDNISONE 10 MG PO TABS: ORAL_TABLET | ORAL | 0 refills | Status: DC

## 2017-07-03 MED ORDER — HYDROXYZINE HCL 25 MG PO TABS: 25.0000 mg | ORAL_TABLET | Freq: Three times a day (TID) | ORAL | 0 refills | Status: DC | PRN

## 2017-07-03 MED ORDER — METHYLPREDNISOLONE ACETATE 40 MG/ML IJ SUSP
40.0000 mg | Freq: Once | INTRAMUSCULAR | Status: AC
  Administered 2017-07-03: 40 mg via INTRAMUSCULAR

## 2017-07-03 MED ORDER — DOXYCYCLINE HYCLATE 100 MG PO TABS: 100.0000 mg | ORAL_TABLET | Freq: Two times a day (BID) | ORAL | 0 refills | Status: DC

## 2017-07-03 NOTE — Patient Instructions (Addendum)
You appear to have allergic reaction with secondary folliculitis. Etiology of the allergic reaction not completely clear. Source may be mosquito bites or exposure to sand fleas. There are also other possibilities but these appear to be top of the list presently.  We gave you Depo-Medrol 40 mg IM injection today.. Also prescription of prednisone 6 day tapered dose. Hydroxyzine antihistamine prescription for itching.  For secondary folliculitis secondary to extreme itching ,I am prescribing doxycycline tablets. Rx advising given.  If rash resolves but then randomly recurrent would need to refer to allergist. If rash persists worsens or changes let us know.  Follow-up in 7-10 days or as needed.

## 2017-07-03 NOTE — Progress Notes (Signed)
Subjective:    Patient ID: Daniel Durham, male    DOB: 06-16-1966, 51 y.o.   MRN: [id number]  HPI  Pt in with scattered bumps and itching all over. Pt states bump/rash started yesterday. He was at the beach past 2 days. He was remembers feeling like he was getting bit. Occasionally saw mosquitoes. Then speculates maybe sand flees.  Pt states hard to sleep due to severe itching.  Pt has been taking benadryl and it is not helping.  Pt girlfriend or grandaughter has no rash. Other family members have no rash either. Family members all appear to have similar/same exposure.   Patient is not diabetic and his A1c in recent past was good.  On review no definitive cause a rash identified. Though he knows he was bitten by various mosquitoes and thinks he might have been exposed to sand fleas. On review of other exposures such as soaps creams new animals lotions no cause identified for sure as well.   Review of Systems  Constitutional: Negative for chills and fatigue.  Respiratory: Negative for cough, chest tightness and wheezing.   Cardiovascular: Negative for chest pain and palpitations.  Gastrointestinal: Negative for abdominal distention, abdominal pain, constipation and diarrhea.  Musculoskeletal: Negative for back pain, gait problem and joint swelling.  Skin: Positive for rash.       See history of present illness and physical exam  Neurological: Negative for dizziness, seizures, syncope, speech difficulty, weakness and light-headedness.  Hematological: Negative for adenopathy. Does not bruise/bleed easily.  Psychiatric/Behavioral: Negative for behavioral problems.   Past Medical History:  Diagnosis Date  . Asthma    SEASONAL   . Chronic back pain    Neurostimulator  . Depression   . Environmental allergies   . Hypertension   . Neuropathy   . Seasonal allergic conjunctivitis      Social History   Social History  . Marital status: Divorced    Spouse name: N/A  . Number  of children: N/A  . Years of education: N/A   Occupational History  . Not on file.   Social History Main Topics  . Smoking status: Never Smoker  . Smokeless tobacco: Never Used  . Alcohol use 0.0 oz/week     Comment: OCCAS  . Drug use: No  . Sexual activity: Not Currently   Other Topics Concern  . Not on file   Social History Narrative  . No narrative on file    Past Surgical History:  Procedure Laterality Date  . 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 EF 45% in RAO, 60% LAO (51% stress, 54% rest by NM stress; 57-59% echo 10/2010)  . CARPAL TUNNEL RELEASE     Bilateral  . HAND AMPUTATION  2007  . ROTATOR CUFF REPAIR     Left  . SPINAL CORD STIMULATOR IMPLANT    . SPINAL CORD STIMULATOR REMOVAL N/A 10/26/2015   Procedure: LUMBAR SPINAL CORD STIMULATOR REMOVAL;  Surgeon: Clydell Hakim, MD;  Location: [facility];  Service: Neurosurgery;  Laterality: N/A;    Family History  Problem Relation Age of Onset  . Diabetes Mother        Living  . Sleep apnea Mother   . Diabetes Father 64       Deceased  . Hypertension Father   . Jaundice Father   . Hemophilia Father   . Alcoholism Father   . Cancer Maternal Grandfather  Throat  . Cancer Paternal Uncle        Throat  . Asthma Sister   . Diabetes Sister   . Healthy Son        X1  . Healthy Daughter        X1    Allergies  Allergen Reactions  . Aspirin Swelling  . Hydrocodone Itching    Current Outpatient Prescriptions on File Prior to Visit  Medication Sig Dispense Refill  . cyclobenzaprine (FLEXERIL) 10 MG tablet TAKE 1 TABLET THREE TIMES DAILY AS NEEDED FOR MUSCLE SPASMS. 90 tablet 1  . fluticasone-salmeterol (ADVAIR HFA) 230-21 MCG/ACT inhaler Inhale 2 puffs into the lungs 2 (two) times daily. 3 Inhaler 1  . gabapentin (NEURONTIN) 300 MG capsule Take 2 capsules (600 mg total) by mouth 3 (three) times daily. 540 capsule 1  . lisinopril  (PRINIVIL,ZESTRIL) 20 MG tablet Take 1 tablet (20 mg total) by mouth daily. 90 tablet 3  . meclizine (ANTIVERT) 25 MG tablet Take 1 tablet (25 mg total) by mouth 3 (three) times daily as needed for dizziness. 30 tablet 0  . montelukast (SINGULAIR) 10 MG tablet TAKE 1 TABLET(10 MG) BY MOUTH AT BEDTIME 90 tablet 0  . Multiple Vitamins-Minerals (HM MULTIVITAMIN ADULT GUMMY) CHEW Chew 2 tablets by mouth daily.     . naproxen (NAPROSYN) 500 MG tablet Take 500 mg by mouth 2 (two) times daily with a meal.    . Oxycodone HCl 10 MG TABS Take 1 tablet (10 mg total) by mouth every 4 (four) hours. (Patient taking differently: Take 10 mg by mouth. Take 1 tablet every 6 hours as needed for pain) 180 tablet 0  . sertraline (ZOLOFT) 25 MG tablet TAKE 1 TABLET(25 MG) BY MOUTH DAILY (Patient taking differently: TAKE 2 TABLETS DAILY) 90 tablet 1  . triamcinolone cream (KENALOG) 0.1 % Apply 1 application topically 2 (two) times daily. 30 g 0  . VENTOLIN HFA 108 (90 Base) MCG/ACT inhaler INHALE 2 PUFFS BY MOUTH EVERY 6 HOURS AS NEEDED FOR WHEEZING 18 g 3   No current facility-administered medications on file prior to visit.     BP 134/79 (BP Location: Left Arm, Patient Position: Sitting, Cuff Size: Normal)   Pulse 77   Temp 97.6 F (36.4 C) (Oral)   Ht 5\' 7"  (1.702 m)   Wt 248 lb 12.8 oz (112.9 kg)   SpO2 96%   BMI 38.97 kg/m       Objective:   Physical Exam  General- No acute distress. Pleasant patient. Neck- Full range of motion, no jvd Lungs- Clear, even and unlabored. Heart- regular rate and rhythm. Neurologic- CNII- XII grossly intact.   Skin- scattered papules/rash. All over arms, legs, anterior thorax, posterior thorax upper back and chest. Some of these areas are weeping and a few have a honey crusted appearance.  Number of papules estimated to be 88 in total. Some areas more concentrated than others. But generally all over. There is scant rare noted on his face. There are no vesicular  lesions seen.      Assessment & Plan:  You appear to have allergic reaction with secondary folliculitis. Etiology of the allergic reaction not completely clear. Source may be mosquito bites or exposure to sand fleas. There are also other possibilities but these appear to be top of the list presently.  We gave you Depo-Medrol 40 mg IM injection today.. Also prescription of prednisone 6 day tapered dose. Hydroxyzine antihistamine prescription for itching.  For secondary folliculitis  secondary to extreme itching ,I am prescribing doxycycline tablets. Rx advising given.  If rash resolves but then randomly recurrent would need to refer to allergist. If rash persists worsens or changes let us know.  Follow-up in 7-10 days or as needed.

## 2017-09-18 ENCOUNTER — Other Ambulatory Visit: Payer: Self-pay | Admitting: Physician Assistant

## 2017-10-20 ENCOUNTER — Ambulatory Visit: Payer: Self-pay | Admitting: *Deleted

## 2017-10-20 NOTE — Telephone Encounter (Signed)
  Reason for Disposition . Shoulder pain is a chronic symptom (recurrent or ongoing AND present > 4 weeks)    Pt reports 10/10 pain, ongoing x 6 weeks. Taking Oxy. IR 10mg  Q 6 hours with minimal relief. He is out of town right now, would like to make appt. For some time in Jan..  Answer Assessment - Initial Assessment Questions 1. ONSET: "When did the pain start?"     6 weeks ago 2. LOCATION: "Where is the pain located?"     right 3. PAIN: "How bad is the pain?" (Scale 1-10; or mild, moderate, severe)   - MILD (1-3): doesn't interfere with normal activities   - MODERATE (4-7): interferes with normal activities (e.g., work or school) or awakens from sleep   - SEVERE (8-10): excruciating pain, unable to do any normal activities, unable to move arm at all due to pain     10/10 4. WORK OR EXERCISE: "Has there been any recent work or exercise that involved this part of the body?"     Lifting 5. CAUSE: "What do you think is causing the shoulder pain?"     Had rotator cuff repair in past of left shoulder 6. OTHER SYMPTOMS: "Do you have any other symptoms?" (e.g., neck pain, swelling, rash, fever, numbness, weakness)    Mild swelling 7. PREGNANCY: "Is there any chance you are pregnant?" "When was your last menstrual period?"     n/a  Protocols used: SHOULDER PAIN-A-AH

## 2017-10-20 NOTE — Telephone Encounter (Signed)
Just FYI - Patient has made an appointment, here are triage notes.

## 2017-10-20 NOTE — Telephone Encounter (Signed)
Patient has been informed of information and states that he will go somewhere to be seen since he will not be in town to be seen here.  Appt. Has been kept for January so that he can be seen when he gets back in town as well.

## 2017-10-20 NOTE — Telephone Encounter (Signed)
LM for patient to call to discuss.  

## 2017-10-20 NOTE — Telephone Encounter (Signed)
January is quite a while to wait. I would recommend he see someone while he is out of town if he is not returning to town this week. We do not want things to worsen.

## 2017-11-05 ENCOUNTER — Ambulatory Visit: Payer: Medicare PPO | Admitting: Physician Assistant

## 2017-11-06 ENCOUNTER — Encounter: Payer: Self-pay | Admitting: Physician Assistant

## 2017-11-06 ENCOUNTER — Ambulatory Visit (INDEPENDENT_AMBULATORY_CARE_PROVIDER_SITE_OTHER): Payer: Medicare PPO | Admitting: Physician Assistant

## 2017-11-06 VITALS — BP 118/80 | HR 72 | Temp 98.4°F | Resp 14 | Ht 67.0 in | Wt 255.0 lb

## 2017-11-06 DIAGNOSIS — M67911 Unspecified disorder of synovium and tendon, right shoulder: Secondary | ICD-10-CM | POA: Diagnosis not present

## 2017-11-06 NOTE — Patient Instructions (Signed)
Start Flexeril each evening as directed. Continue pain medication. Apply topical Icy Hot to the area. No heavy lifting or overexertion.  If not improving we will need Sports Medicine Assessment.   Rotator Cuff Tendinitis Rotator cuff tendinitis is inflammation of the tough, cord-like bands that connect muscle to bone (tendons) in the rotator cuff. The rotator cuff includes all of the muscles and tendons that connect the arm to the shoulder. The rotator cuff holds the head of the upper arm bone (humerus) in the cup (fossa) of the shoulder blade (scapula). This condition can lead to a long-lasting (chronic) tear. The tear may be partial or complete. What are the causes? This condition is usually caused by overusing the rotator cuff. What increases the risk? This condition is more likely to develop in athletes and workers who frequently use their shoulder or reach over their heads. This can include activities such as:  Tennis.  Baseball or softball.  Swimming.  Construction work.  Painting.  What are the signs or symptoms? Symptoms of this condition include:  Pain spreading (radiating) from the shoulder to the upper arm.  Swelling and tenderness in front of the shoulder.  Pain when reaching, pulling, or lifting the arm above the head.  Pain when lowering the arm from above the head.  Minor pain in the shoulder when resting.  Increased pain in the shoulder at night.  Difficulty placing the arm behind the back.  How is this diagnosed? This condition is diagnosed with a medical history and physical exam. Tests may also be done, including:  X-rays.  MRI.  Ultrasounds.  CT or MR arthrogram. During this test, a contrast material is injected and then images are taken.  How is this treated? Treatment for this condition depends on the severity of the condition. In less severe cases, treatment may include:  Rest. This may be done with a sling that holds the shoulder still  (immobilization). Your health care provider may also recommend avoiding activities that involve lifting your arm over your head.  Icing the shoulder.  Anti-inflammatory medicines, such as aspirin or ibuprofen.  In more severe cases, treatment may include:  Physical therapy.  Steroid injections.  Surgery.  Follow these instructions at home: If you have a sling:  Wear the sling as told by your health care provider. Remove it only as told by your health care provider.  Loosen the sling if your fingers tingle, become numb, or turn cold and blue.  Keep the sling clean.  If the sling is not waterproof, do not let it get wet. Remove it, if allowed, or cover it with a watertight covering when you take a bath or shower. Managing pain, stiffness, and swelling  If directed, put ice on the injured area. ? If you have a removable sling, remove it as told by your health care provider. ? Put ice in a plastic bag. ? Place a towel between your skin and the bag. ? Leave the ice on for 20 minutes, 2-3 times a day.  Move your fingers often to avoid stiffness and to lessen swelling.  Raise (elevate) the injured area above the level of your heart while you are lying down.  Find a comfortable sleeping position or sleep on a recliner, if available. Driving  Do not drive or use heavy machinery while taking prescription pain medicine.  Ask your health care provider when it is safe to drive if you have a sling on your arm. Activity  Rest your shoulder as  told by your health care provider.  Return to your normal activities as told by your health care provider. Ask your health care provider what activities are safe for you.  Do any exercises or stretches as told by your health care provider.  If you do repetitive overhead tasks, take small breaks in between and include stretching exercises as told by your health care provider. General instructions  Do not use any products that contain  nicotine or tobacco, such as cigarettes and e-cigarettes. These can delay healing. If you need help quitting, ask your health care provider.  Take over-the-counter and prescription medicines only as told by your health care provider.  Keep all follow-up visits as told by your health care provider. This is important. Contact a health care provider if:  Your pain gets worse.  You have new pain in your arm, hands, or fingers.  Your pain is not relieved with medicine or does not get better after 6 weeks of treatment.  You have cracking sensations when moving your shoulder in certain directions.  You hear a snapping sound after using your shoulder, followed by severe pain and weakness. Get help right away if:  Your arm, hand, or fingers are numb or tingling.  Your arm, hand, or fingers are swollen or painful or they turn white or blue. Summary  Rotator cuff tendinitis is inflammation of the tough, cord-like bands that connect muscle to bone (tendons) in the rotator cuff.  This condition is usually caused by overusing the rotator cuff, which includes all of the muscles and tendons that connect the arm to the shoulder.  This condition is more likely to develop in athletes and workers who frequently use their shoulder or reach over their heads.  Treatment generally includes rest, anti-inflammatory medicines, and icing. In some cases, physical therapy and steroid injections may be needed. In severe cases, surgery may be needed. This information is not intended to replace advice given to you by your health care provider. Make sure you discuss any questions you have with your health care provider. Document Released: 01/10/2004 Document Revised: 10/06/2016 Document Reviewed: 10/06/2016 Elsevier Interactive Patient Education  2017 [employer].

## 2017-11-07 NOTE — Progress Notes (Signed)
Patient presents to clinic today c/o 4 weeks of pain of anterior R shoulder worsened with ROM of shoulder. Denies weakness or decreased ROM. Pain is staying about 3/10. Denies numbness, tingling or radiation of pain.   Past Medical History:  Diagnosis Date  . Asthma    SEASONAL   . Chronic back pain    Neurostimulator  . Depression   . Environmental allergies   . Hypertension   . Neuropathy   . Seasonal allergic conjunctivitis     Current Outpatient Medications on File Prior to Visit  Medication Sig Dispense Refill  . cyclobenzaprine (FLEXERIL) 10 MG tablet TAKE 1 TABLET THREE TIMES DAILY AS NEEDED FOR MUSCLE SPASMS. 90 tablet 1  . fluticasone-salmeterol (ADVAIR HFA) 230-21 MCG/ACT inhaler Inhale 2 puffs into the lungs 2 (two) times daily. 3 Inhaler 1  . gabapentin (NEURONTIN) 600 MG tablet Take 600 mg by mouth 3 (three) times daily.    Marland Kitchen lisinopril (PRINIVIL,ZESTRIL) 20 MG tablet Take 1 tablet (20 mg total) by mouth daily. 90 tablet 3  . montelukast (SINGULAIR) 10 MG tablet TAKE 1 TABLET(10 MG) BY MOUTH AT BEDTIME 90 tablet 1  . Multiple Vitamins-Minerals (HM MULTIVITAMIN ADULT GUMMY) CHEW Chew 2 tablets by mouth daily.     . Oxycodone HCl 10 MG TABS Take 1 tablet (10 mg total) by mouth every 4 (four) hours. (Patient taking differently: Take 10 mg by mouth. Take 1 tablet every 6 hours as needed for pain) 180 tablet 0  . sertraline (ZOLOFT) 25 MG tablet TAKE 1 TABLET(25 MG) BY MOUTH DAILY 90 tablet 0  . VENTOLIN HFA 108 (90 Base) MCG/ACT inhaler INHALE 2 PUFFS BY MOUTH EVERY 6 HOURS AS NEEDED FOR WHEEZING 18 g 3  . hydrOXYzine (ATARAX/VISTARIL) 25 MG tablet Take 1 tablet (25 mg total) by mouth every 8 (eight) hours as needed for itching. (Patient not taking: Reported on 11/06/2017) 30 tablet 0   No current facility-administered medications on file prior to visit.     Allergies  Allergen Reactions  . Aspirin Swelling  . Hydrocodone Itching    Family History  Problem Relation  Age of Onset  . Diabetes Mother        Living  . Sleep apnea Mother   . Diabetes Father 98       Deceased  . Hypertension Father   . Jaundice Father   . Hemophilia Father   . Alcoholism Father   . Cancer Maternal Grandfather        Throat  . Cancer Paternal Uncle        Throat  . Asthma Sister   . Diabetes Sister   . Healthy Son        X1  . Healthy Daughter        X1   Review of Systems - See HPI.  All other ROS are negative.  BP 118/80   Pulse 72   Temp 98.4 F (36.9 C) (Oral)   Resp 14   Ht 5\' 7"  (1.702 m)   Wt 255 lb (115.7 kg)   SpO2 98%   BMI 39.94 kg/m   Physical Exam  Constitutional: He is well-developed, well-nourished, and in no distress.  HENT:  Head: Normocephalic and atraumatic.  Eyes: Conjunctivae are normal.  Neck: Neck supple.  Cardiovascular: Normal rate, regular rhythm, normal heart sounds and intact distal pulses.  Pulmonary/Chest: Effort normal and breath sounds normal. No respiratory distress. He has no wheezes. He has no rales. He exhibits no tenderness.  Musculoskeletal:       Right shoulder: He exhibits tenderness and pain. He exhibits normal range of motion, no effusion, no crepitus, no spasm and normal strength.  Skin: Skin is warm and dry. No rash noted.  Psychiatric: Affect normal.  Vitals reviewed.  Assessment/Plan: 1. Tendinopathy of right rotator cuff Continue chronic pain medication. Topical medications reviewed. Will start rehab exercises. If not improving, will need Sports Medicine assessment and [place].     Leeanne Rio, PA-C

## 2017-11-11 DIAGNOSIS — H52209 Unspecified astigmatism, unspecified eye: Secondary | ICD-10-CM | POA: Diagnosis not present

## 2017-11-11 DIAGNOSIS — H5203 Hypermetropia, bilateral: Secondary | ICD-10-CM | POA: Diagnosis not present

## 2017-11-11 DIAGNOSIS — H524 Presbyopia: Secondary | ICD-10-CM | POA: Diagnosis not present

## 2017-11-19 DIAGNOSIS — M5442 Lumbago with sciatica, left side: Secondary | ICD-10-CM | POA: Diagnosis not present

## 2017-11-19 DIAGNOSIS — M545 Low back pain: Secondary | ICD-10-CM | POA: Diagnosis not present

## 2017-11-19 DIAGNOSIS — Z6839 Body mass index (BMI) 39.0-39.9, adult: Secondary | ICD-10-CM | POA: Diagnosis not present

## 2017-11-19 DIAGNOSIS — I10 Essential (primary) hypertension: Secondary | ICD-10-CM | POA: Diagnosis not present

## 2017-12-06 ENCOUNTER — Other Ambulatory Visit: Payer: Self-pay

## 2017-12-06 ENCOUNTER — Emergency Department (HOSPITAL_BASED_OUTPATIENT_CLINIC_OR_DEPARTMENT_OTHER): Payer: Medicare HMO

## 2017-12-06 ENCOUNTER — Encounter (HOSPITAL_BASED_OUTPATIENT_CLINIC_OR_DEPARTMENT_OTHER): Payer: Self-pay | Admitting: *Deleted

## 2017-12-06 ENCOUNTER — Emergency Department (HOSPITAL_BASED_OUTPATIENT_CLINIC_OR_DEPARTMENT_OTHER)
Admission: EM | Admit: 2017-12-06 | Discharge: 2017-12-06 | Disposition: A | Discharge status: Home / Self Care | Payer: Medicare HMO | Attending: Emergency Medicine | Admitting: Emergency Medicine

## 2017-12-06 DIAGNOSIS — J45909 Unspecified asthma, uncomplicated: Secondary | ICD-10-CM | POA: Insufficient documentation

## 2017-12-06 DIAGNOSIS — F329 Major depressive disorder, single episode, unspecified: Secondary | ICD-10-CM | POA: Diagnosis not present

## 2017-12-06 DIAGNOSIS — I10 Essential (primary) hypertension: Secondary | ICD-10-CM | POA: Insufficient documentation

## 2017-12-06 DIAGNOSIS — M79601 Pain in right arm: Secondary | ICD-10-CM | POA: Diagnosis not present

## 2017-12-06 DIAGNOSIS — F419 Anxiety disorder, unspecified: Secondary | ICD-10-CM | POA: Diagnosis not present

## 2017-12-06 DIAGNOSIS — Z79899 Other long term (current) drug therapy: Secondary | ICD-10-CM | POA: Diagnosis not present

## 2017-12-06 DIAGNOSIS — M542 Cervicalgia: Secondary | ICD-10-CM | POA: Diagnosis not present

## 2017-12-06 DIAGNOSIS — M25511 Pain in right shoulder: Secondary | ICD-10-CM | POA: Diagnosis not present

## 2017-12-06 MED ORDER — CYCLOBENZAPRINE HCL 10 MG PO TABS: 10.0000 mg | ORAL_TABLET | Freq: Two times a day (BID) | ORAL | 0 refills | Status: DC | PRN

## 2017-12-06 MED ORDER — DEXAMETHASONE SODIUM PHOSPHATE 10 MG/ML IJ SOLN
10.0000 mg | Freq: Once | INTRAMUSCULAR | Status: AC
  Administered 2017-12-06: 10 mg via INTRAVENOUS
  Filled 2017-12-06: qty 1

## 2017-12-06 MED ORDER — PREDNISONE 20 MG PO TABS: 40.0000 mg | ORAL_TABLET | Freq: Every day | ORAL | 0 refills | Status: DC

## 2017-12-06 MED ORDER — OXYCODONE HCL 5 MG PO TABS
10.0000 mg | ORAL_TABLET | Freq: Once | ORAL | Status: AC
  Administered 2017-12-06: 10 mg via ORAL
  Filled 2017-12-06: qty 2

## 2017-12-06 MED ORDER — LIDOCAINE 1.8 % EX PTCH: 1.0000 | MEDICATED_PATCH | Freq: Every day | CUTANEOUS | 0 refills | Status: DC

## 2017-12-06 MED ORDER — CYCLOBENZAPRINE HCL 10 MG PO TABS
10.0000 mg | ORAL_TABLET | Freq: Once | ORAL | Status: AC
  Administered 2017-12-06: 10 mg via ORAL
  Filled 2017-12-06: qty 1

## 2017-12-06 NOTE — ED Notes (Signed)
PT is requesting pain medication. PA informed.

## 2017-12-06 NOTE — ED Triage Notes (Signed)
Pt reports right arm x several months. ?torn rotator cuff. States over last few days pain is "excrutiating". Can't get any relief with usual pain medications or change in position

## 2017-12-06 NOTE — ED Notes (Signed)
Pt discharged to home with family. NAD.  

## 2017-12-06 NOTE — ED Provider Notes (Signed)
[facility] EMERGENCY DEPARTMENT Provider Note   CSN: [id number] Arrival date & time: 12/06/17  1657     History   Chief Complaint Chief Complaint  Patient presents with  . Arm Pain    HPI Daniel Durham is a 52 y.o. male.  HPI   Patient is a 52 y.o. 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  Patient reports this occurred for 2 months.  Patient reports that he had worsening symptoms with the last 48 hours.  Patient is previously presented to his primary care provider, who treated him for rotator cuff tendinopathy.  Patient reports that he notes that he feels that his right hand is going to sleep, and he has shooting pains from his elbow down to his hand.  Patient reports that all of his fingers feel that they are going to sleep on his in particular depression.  Patient denies any fevers, chills, pallor, or muscular weakness of his right upper externally.  Patient denies neck trauma or neck injury specific to his symptoms.  Patient denies history of degenerative disc disease of the cervical spine.  Past Medical History:  Diagnosis Date  . Asthma    SEASONAL   . Chronic back pain    Neurostimulator  . Depression   . Environmental allergies   . Hypertension   . Neuropathy   . Seasonal allergic conjunctivitis     Patient Active Problem List   Diagnosis Date Noted  . Anxiety and depression 03/03/2017  . Cervical disc disorder with radiculopathy of cervical region 07/24/2015  . 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 Exacerbation of chronic back pain 03/20/2015  . Amputation of arm below elbow, left ([place]) 02/04/2015  . Visit for preventive health examination 02/04/2015  . Chronic back pain greater than 3 months duration 01/23/2015  . 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 Amputation stump  complication ([place]) 17/51/0258    Past Surgical History:  Procedure Laterality Date  . CARDIAC CATHETERIZATION     11/05/10 [facility], [place]): Briding of mLAD, Durham sign disease. EF 45% in RAO, 60% LAO (51% stress, 54% rest by NM stress; 57-59% echo 10/2010)  . CARPAL TUNNEL RELEASE     Bilateral  . HAND AMPUTATION  2007  . ROTATOR CUFF REPAIR     Left  . SPINAL CORD STIMULATOR IMPLANT    . SPINAL CORD STIMULATOR REMOVAL N/A 10/26/2015   Procedure: LUMBAR SPINAL CORD STIMULATOR REMOVAL;  Surgeon: Clydell Hakim, MD;  Location: [facility];  Service: Neurosurgery;  Laterality: N/A;       Home Medications    Prior to Admission medications   Medication Sig Start Date End Date Taking? Authorizing Provider  cyclobenzaprine (FLEXERIL) 10 MG tablet TAKE 1 TABLET THREE TIMES DAILY AS NEEDED FOR MUSCLE SPASMS. 08/29/15   Brunetta Jeans, PA-C  fluticasone-salmeterol (ADVAIR HFA) [phone] MCG/ACT inhaler Inhale 2 puffs into the lungs 2 (two) times daily. 01/31/15   Brunetta Jeans, PA-C  gabapentin (NEURONTIN) 600 MG tablet Take 600 mg by mouth 3 (three) times daily.    [provider]  hydrOXYzine (ATARAX/VISTARIL) 25 MG tablet Take 1 tablet (25 mg total) by mouth every 8 (eight) hours as needed for itching. Patient not taking: Reported on 11/06/2017 07/03/17   Saguier, Percell Miller, PA-C  lisinopril (PRINIVIL,ZESTRIL) 20 MG tablet Take 1 tablet (20  mg total) by mouth daily. 12/24/16   Brunetta Jeans, PA-C  montelukast (SINGULAIR) 10 MG tablet TAKE 1 TABLET(10 MG) BY MOUTH AT BEDTIME 09/18/17   Brunetta Jeans, PA-C  Multiple Vitamins-Minerals (HM MULTIVITAMIN ADULT GUMMY) CHEW Chew 2 tablets by mouth daily.     [provider]  Oxycodone HCl 10 MG TABS Take 1 tablet (10 mg total) by mouth every 4 (four) hours. Patient taking differently: Take 10 mg by mouth. Take 1 tablet every 6 hours as needed for pain 08/10/15   Raiford Noble C, PA-C  sertraline (ZOLOFT) 25 MG tablet  TAKE 1 TABLET(25 MG) BY MOUTH DAILY 09/18/17   Brunetta Jeans, PA-C  VENTOLIN HFA 108 [phone] Base) MCG/ACT inhaler INHALE 2 PUFFS BY MOUTH EVERY 6 HOURS AS NEEDED FOR WHEEZING 02/16/17   Brunetta Jeans, PA-C    Family History Family History  Problem Relation Age of Onset  . Diabetes Mother        Living  . Sleep apnea Mother   . Diabetes Father 88       Deceased  . Hypertension Father   . Jaundice Father   . Hemophilia Father   . Alcoholism Father   . Cancer Maternal Grandfather        Throat  . Cancer Paternal Uncle        Throat  . Asthma Sister   . Diabetes Sister   . Healthy Son        X1  . Healthy Daughter        X1    Social History Social History   Tobacco Use  . Smoking status: Never Smoker  . Smokeless tobacco: Never Used  Substance Use Topics  . Alcohol use: Yes    Alcohol/week: 0.0 oz    Comment: OCCAS  . Drug use: Durham     Allergies   Aspirin and Hydrocodone   Review of Systems Review of Systems  Constitutional: Negative for chills and fever.  Musculoskeletal: Negative for gait problem, neck pain and neck stiffness.  Skin: Negative for color change and rash.  Neurological: Negative for weakness.       +paresthesias     Physical Exam Updated Vital Signs BP (!) 164/98 (BP Location: Left Arm)   Pulse 81   Temp 98.3 F (36.8 C) (Oral)   Resp 18   Ht 5\' 7"  (1.702 m)   Wt 114.3 kg (252 lb)   SpO2 96%   BMI 39.47 kg/m   Physical Exam  Constitutional: He appears well-developed and well-nourished. Durham distress.  Sitting comfortably in bed.  HENT:  Head: Normocephalic and atraumatic.  Eyes: Conjunctivae are normal. Right eye exhibits Durham discharge. Left eye exhibits Durham discharge.  EOMs normal to gross examination.  Neck: Normal range of motion.  Cardiovascular: Normal rate and regular rhythm.  Intact, 2+ radial pulse on right.  Capillary refill less than 2 seconds.  Pulmonary/Chest:  Normal respiratory effort. Patient converses  comfortably. Durham audible wheeze or stridor.  Abdominal: He exhibits Durham distension.  Musculoskeletal:  Patient with a below elbow amputation on left. Durham midline cervical spine tenderness.  There is mild right-sided cervical paraspinal muscular tenderness. Right shoulder with tenderness to palpation of the region of the deltoid as depicted in image. Full ROM, pain with abduction. + empty can test, negative Neer's. Durham swelling, erythema or ecchymosis present. Durham step-off, crepitus, or deformity appreciated. 5/5 muscle strength of UE. 2+ radial pulse, sensation intact and all compartments soft. Normal  gait normal balance.  Neurological: He is alert.  Cranial nerves intact to gross observation. Patient moves extremities without difficulty.  Skin: Skin is warm and dry. He is not diaphoretic.  Psychiatric: He has a normal mood and affect. His behavior is normal. Judgment and thought content normal.  Nursing note and vitals reviewed.    ED Treatments / Results  Labs (all labs ordered are listed, but only abnormal results are displayed) Labs Reviewed - Durham data to display  EKG  EKG Interpretation None       Radiology Dg Cervical Spine 2-3 Views  Result Date: 12/06/2017 CLINICAL DATA:  Pain and stiffness EXAM: CERVICAL SPINE - 2-3 VIEW COMPARISON:  07/24/2015 FINDINGS: Straightening of the cervical spine. Vertebral body heights are normal. Prevertebral soft tissue thickness is normal. Minimal degenerative changes at C5-C6 and C6-C7. Suboptimal visualization of the cervicothoracic junction. Dens and lateral masses are within normal limits. IMPRESSION: Suboptimal visualization of cervicothoracic junction. Minimal degenerative changes. Durham acute osseous abnormality. Electronically Signed   By: Donavan Foil M.D.   On: 12/06/2017 20:29   Dg Shoulder Right  Result Date: 12/06/2017 CLINICAL DATA:  Neck and shoulder pain EXAM: RIGHT SHOULDER - 2+ VIEW COMPARISON:  None. FINDINGS: There is Durham evidence of  fracture or dislocation. There is Durham evidence of arthropathy or other focal bone abnormality. Soft tissues are unremarkable. IMPRESSION: Negative. Electronically Signed   By: Donavan Foil M.D.   On: 12/06/2017 20:29    Procedures Procedures (including critical care time)  Medications Ordered in ED Medications  oxyCODONE (Oxy IR/ROXICODONE) immediate release tablet 10 mg (not administered)  dexamethasone (DECADRON) injection 10 mg (10 mg Intravenous Given 12/06/17 1947)  cyclobenzaprine (FLEXERIL) tablet 10 mg (10 mg Oral Given 12/06/17 1948)     Initial Impression / Assessment and Plan / ED Course  I have reviewed the triage vital signs and the nursing notes.  Pertinent labs & imaging results that were available during my care of the patient were reviewed by me and considered in my medical decision making (see chart for details).    Final Clinical Impressions(s) / ED Diagnoses   Final diagnoses:  Right arm pain  Acute pain of right shoulder   Patient is nontoxic-appearing, afebrile, Durham acute distress.  Differential diagnosis includes cervical radiculopathy versus rotator cuff tendinopathy.  I feel that this is a combination of these pathologies.  Doubt arterial occlusion, as patient is fully vascularly intact in the right upper extremity.  Patient exhibits Durham muscular weakness.  Patient has sensation intact light and sharp touch in the distal upper extremity.  Radiographs of the cervical spine degenerative changes at C5-C6 and C6-C7. Durham subluxations. Will treat symptomatically for rotator cuff tendinopathy, and cervical radiculopathy.  Patient given dose of Decadron in the emergency department today, as well as prescription for 4 days of prednisone.  Additionally, patient given a Flexeril prescription as well as lidocaine patches.  Due to patient's chronic pain prescriptions, discussed with patient that further opioid pain management is not possible at this time.  Do not feel that there are  any acute limb threatening pathologies at this time if patient can safely follow-up with orthopedics and sports medicine.  Patient is in understanding and agrees with the plan of care.  This is a supervised visit with Dr. Isla Pence. Evaluation, management, and discharge planning discussed with this attending physician.  ED Discharge Orders    None       Albesa Seen, [place] 12/07/17 1610  Isla Pence, MD 12/08/17 1314

## 2017-12-06 NOTE — ED Notes (Signed)
ED Provider at bedside. 

## 2017-12-06 NOTE — Discharge Instructions (Signed)
It was my pleasure taking care of you today!  Flexeril and lidocaine patches as needed for pain. Ice shoulder throughout the day (instructions below).  Wear shoulder sling for no more than 3 days, then begin performing gentle range of motion exercises.   Call the orthopedist listed today or tomorrow to schedule a follow up appointment for recheck of ongoing shoulder pain in 1-2 weeks that can be canceled with a 24-48 hour notice if complete resolution of pain.   Call the orthopedist listed if symptoms are not improved in one week.   Return to the ER for new or worsening symptoms, any additional concerns.  COLD THERAPY DIRECTIONS:  Ice or gel packs can be used to reduce both pain and swelling. Ice is the most helpful within the first 24 to 48 hours after an injury or flareup from overusing a muscle or joint.  Ice is effective, has very few side effects, and is safe for most people to use.   If you expose your skin to cold temperatures for too long or without the proper protection, you can damage your skin or nerves. Watch for signs of skin damage due to cold.   HOME CARE INSTRUCTIONS  Follow these tips to use ice and cold packs safely.  Place a dry or damp towel between the ice and skin. A damp towel will cool the skin more quickly, so you may need to shorten the time that the ice is used.  For a more rapid response, add gentle compression to the ice.  Ice for no more than 10 to 20 minutes at a time. The bonier the area you are icing, the less time it will take to get the benefits of ice.  Check your skin after 5 minutes to make sure there are no signs of a poor response to cold or skin damage.  Rest 20 minutes or more in between uses.  Once your skin is numb, you can end your treatment. You can test numbness by very lightly touching your skin. The touch should be so light that you do not see the skin dimple from the pressure of your fingertip. When using ice, most people will feel these normal  sensations in this order: cold, burning, aching, and numbness.

## 2017-12-07 ENCOUNTER — Telehealth: Payer: Self-pay | Admitting: Physician Assistant

## 2017-12-07 DIAGNOSIS — M25511 Pain in right shoulder: Secondary | ICD-10-CM

## 2017-12-07 NOTE — Telephone Encounter (Signed)
Referral placed.

## 2017-12-07 NOTE — Telephone Encounter (Signed)
Referral to orthopedics please

## 2017-12-07 NOTE — Telephone Encounter (Signed)
Copied from [place] [phone] [phone]. Topic: Referral - Request >> Dec 07, 2017  8:20 AM Darl Householder, RMA wrote: Reason for CRM: patient is requesting a call back concerning a referral for orthopedics please return pt call at [phone]

## 2017-12-07 NOTE — Telephone Encounter (Signed)
Please advise if patient needs an Orthopaedic referral or Sports Medicine referral?

## 2017-12-08 ENCOUNTER — Ambulatory Visit (INDEPENDENT_AMBULATORY_CARE_PROVIDER_SITE_OTHER): Payer: Medicare PPO | Admitting: Orthopaedic Surgery

## 2017-12-08 ENCOUNTER — Emergency Department (HOSPITAL_COMMUNITY): Payer: Medicare HMO

## 2017-12-08 ENCOUNTER — Encounter (INDEPENDENT_AMBULATORY_CARE_PROVIDER_SITE_OTHER): Payer: Self-pay | Admitting: Orthopaedic Surgery

## 2017-12-08 ENCOUNTER — Encounter (HOSPITAL_COMMUNITY): Payer: Self-pay | Admitting: Emergency Medicine

## 2017-12-08 DIAGNOSIS — I10 Essential (primary) hypertension: Secondary | ICD-10-CM | POA: Diagnosis not present

## 2017-12-08 DIAGNOSIS — G8929 Other chronic pain: Secondary | ICD-10-CM | POA: Diagnosis not present

## 2017-12-08 DIAGNOSIS — Z79899 Other long term (current) drug therapy: Secondary | ICD-10-CM | POA: Diagnosis not present

## 2017-12-08 DIAGNOSIS — R0602 Shortness of breath: Secondary | ICD-10-CM | POA: Diagnosis not present

## 2017-12-08 DIAGNOSIS — M25511 Pain in right shoulder: Secondary | ICD-10-CM | POA: Insufficient documentation

## 2017-12-08 DIAGNOSIS — J45909 Unspecified asthma, uncomplicated: Secondary | ICD-10-CM | POA: Diagnosis not present

## 2017-12-08 DIAGNOSIS — R079 Chest pain, unspecified: Secondary | ICD-10-CM | POA: Diagnosis not present

## 2017-12-08 LAB — CBC
HEMATOCRIT: 46.9 % (ref 39.0–52.0)
HEMOGLOBIN: 16.2 g/dL (ref 13.0–17.0)
MCH: 33.7 pg (ref 26.0–34.0)
MCHC: 34.5 g/dL (ref 30.0–36.0)
MCV: 97.5 fL (ref 78.0–100.0)
Platelets: 191 10*3/uL (ref 150–400)
RBC: 4.81 MIL/uL (ref 4.22–5.81)
RDW: 13 % (ref 11.5–15.5)
WBC: 11 10*3/uL — ABNORMAL HIGH (ref 4.0–10.5)

## 2017-12-08 LAB — BASIC METABOLIC PANEL
ANION GAP: 11 (ref 5–15)
BUN: 20 mg/dL (ref 6–20)
CHLORIDE: 104 mmol/L (ref 101–111)
CO2: 23 mmol/L (ref 22–32)
Calcium: 9 mg/dL (ref 8.9–10.3)
Creatinine, Ser: 1.07 mg/dL (ref 0.61–1.24)
GFR calc Af Amer: >60 mL/min (ref >60)
GFR calc non Af Amer: >60 mL/min (ref >60)
GLUCOSE: 107 mg/dL — AB (ref 65–99)
POTASSIUM: 4.1 mmol/L (ref 3.5–5.1)
Sodium: 138 mmol/L (ref 135–145)

## 2017-12-08 LAB — I-STAT TROPONIN, ED: Troponin i, poc: 0 ng/mL (ref 0.00–0.08)

## 2017-12-08 MED ORDER — LIDOCAINE HCL 1 % IJ SOLN
3.0000 mL | INTRAMUSCULAR | Status: AC | PRN
  Administered 2017-12-08: 3 mL

## 2017-12-08 MED ORDER — BUPIVACAINE HCL 0.5 % IJ SOLN
3.0000 mL | INTRAMUSCULAR | Status: AC | PRN
  Administered 2017-12-08: 3 mL via INTRA_ARTICULAR

## 2017-12-08 MED ORDER — METHYLPREDNISOLONE ACETATE 40 MG/ML IJ SUSP
40.0000 mg | INTRAMUSCULAR | Status: AC | PRN
  Administered 2017-12-08: 40 mg via INTRA_ARTICULAR

## 2017-12-08 MED ORDER — OXYCODONE-ACETAMINOPHEN 5-325 MG PO TABS
1.0000 | ORAL_TABLET | ORAL | Status: DC | PRN
  Administered 2017-12-08: 1 via ORAL
  Filled 2017-12-08: qty 1

## 2017-12-08 NOTE — Progress Notes (Signed)
Office Visit Note   Patient: Daniel Durham           Date of Birth: May 20, 1966           MRN: [id number] Visit Date: 12/08/2017              Requested by: Brunetta Jeans, PA-C [id number] [place], [place] [address] PCP: Brunetta Jeans, PA-C   Assessment & Plan: Visit Diagnoses:  1. Chronic right shoulder pain     Plan: Impression is right shoulder pain likely rotator cuff tendinosis and cervical radiculopathy.  Subacromial injection was performed today hopefully this will give him some relief.  If not better in about 2-3 weeks patient instructed to follow-up.  Likely order MRI at that time.  I reviewed his x-rays of his cervical spine and shoulder which do not show any significant structural or abnormalities  Follow-Up Instructions: Return if symptoms worsen or fail to improve.   Orders:  No orders of the defined types were placed in this encounter.  No orders of the defined types were placed in this encounter.     Procedures: Large Joint Inj: R subacromial bursa on 12/08/2017 9:49 AM Indications: pain Details: 22 G needle  Arthrogram: No  Medications: 3 mL lidocaine 1 %; 3 mL bupivacaine 0.5 %; 40 mg methylPREDNISolone acetate 40 MG/ML Outcome: tolerated well, no immediate complications Consent was given by the patient. Patient was prepped and draped in the usual sterile fashion.       Clinical Data: No additional findings.   Subjective: Chief Complaint  Patient presents with  . Right Shoulder - Pain    Daniel Durham is a 52 year old gentleman comes in with right shoulder and right upper extremity pain that radiates from the shoulder neck down to the hand.  He complains of pain with use of his right shoulder.  He is status post left mid forearm amputation.  He endorses tingling numbness and burning.  He has been put on gabapentin and prednisone and Flexeril without any significant relief.  This has3 months.  Denies any injuries.  Been going on for  about    Review of Systems  Constitutional: Negative.   All other systems reviewed and are negative.    Objective: Vital Signs: There were no vitals taken for this visit.  Physical Exam  Constitutional: He is oriented to person, place, and time. He appears well-developed and well-nourished.  HENT:  Head: Normocephalic and atraumatic.  Eyes: Pupils are equal, round, and reactive to light.  Neck: Neck supple.  Pulmonary/Chest: Effort normal.  Abdominal: Soft.  Musculoskeletal: Normal range of motion.  Neurological: He is alert and oriented to person, place, and time.  Skin: Skin is warm.  Psychiatric: He has a normal mood and affect. His behavior is normal. Judgment and thought content normal.  Nursing note and vitals reviewed.   Ortho Exam Right shoulder exam shows positive impingement sign.  Pain with empty can testing but overall function is intact.  Infraspinatus intact with pain.  Positive Spurling sign. Specialty Comments:  No specialty comments available.  Imaging: No results found.   PMFS History: Patient Active Problem List   Diagnosis Date Noted  . Anxiety and depression 03/03/2017  . Cervical disc disorder with radiculopathy of cervical region 07/24/2015  . 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 Exacerbation of chronic back pain 03/20/2015  . Amputation of arm below elbow, left ([place]) 02/04/2015  . Visit for preventive health examination  02/04/2015  . Chronic back pain greater than 3 months duration 01/23/2015  . 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 Amputation stump complication ([place]) 62/86/3817   Past Medical History:  Diagnosis Date  . Asthma    SEASONAL   . Chronic back pain    Neurostimulator  . Depression   . Environmental allergies   . Hypertension   . Neuropathy   . Seasonal allergic conjunctivitis     Family History  Problem Relation Age of Onset  . Diabetes Mother        Living   . Sleep apnea Mother   . Diabetes Father 84       Deceased  . Hypertension Father   . Jaundice Father   . Hemophilia Father   . Alcoholism Father   . Cancer Maternal Grandfather        Throat  . Cancer Paternal Uncle        Throat  . Asthma Sister   . Diabetes Sister   . Healthy Son        X1  . Healthy Daughter        X1    Past Surgical History:  Procedure Laterality Date  . CARDIAC CATHETERIZATION     11/05/10 [facility], [place]): Briding of mLAD, no sign disease. EF 45% in RAO, 60% LAO (51% stress, 54% rest by NM stress; 57-59% echo 10/2010)  . CARPAL TUNNEL RELEASE     Bilateral  . HAND AMPUTATION  2007  . ROTATOR CUFF REPAIR     Left  . SPINAL CORD STIMULATOR IMPLANT    . SPINAL CORD STIMULATOR REMOVAL N/A 10/26/2015   Procedure: LUMBAR SPINAL CORD STIMULATOR REMOVAL;  Surgeon: Clydell Hakim, MD;  Location: [facility];  Service: Neurosurgery;  Laterality: N/A;   Social History   Occupational History  . Not on file  Tobacco Use  . Smoking status: Never Smoker  . Smokeless tobacco: Never Used  Substance and Sexual Activity  . Alcohol use: Yes    Alcohol/week: 0.0 oz    Comment: OCCAS  . Drug use: No  . Sexual activity: Not Currently

## 2017-12-08 NOTE — ED Triage Notes (Addendum)
Pt reports R sided shoulder pain, seen by orthopedics today already for same. Given cortisone shot and pain became worse. Requesting MRI, explained to patient that MD would have to order that. Orthopedic did not order today d/t being a new patient, wanted to see if cortisone shot would work per pt.

## 2017-12-08 NOTE — ED Notes (Addendum)
Provided patient with narcotic pain medicine per pain protocol. Advised of side effects and instructed to avoid driving for a minimum of four hours. Now complaining of swelling to R hand. Updated on wait times.

## 2017-12-08 NOTE — ED Notes (Signed)
Pt came to nurse first requesting pain medication.  Pt was offered Percocet 5/325 mg, pt would like medication. Pt taken to triage.

## 2017-12-09 ENCOUNTER — Emergency Department (HOSPITAL_COMMUNITY)
Admission: EM | Admit: 2017-12-09 | Discharge: 2017-12-09 | Disposition: A | Discharge status: Home / Self Care | Payer: Medicare HMO | Attending: Emergency Medicine | Admitting: Emergency Medicine

## 2017-12-09 ENCOUNTER — Telehealth (INDEPENDENT_AMBULATORY_CARE_PROVIDER_SITE_OTHER): Payer: Self-pay

## 2017-12-09 ENCOUNTER — Telehealth (INDEPENDENT_AMBULATORY_CARE_PROVIDER_SITE_OTHER): Payer: Self-pay | Admitting: Orthopaedic Surgery

## 2017-12-09 ENCOUNTER — Other Ambulatory Visit: Payer: Self-pay

## 2017-12-09 DIAGNOSIS — M25511 Pain in right shoulder: Secondary | ICD-10-CM

## 2017-12-09 DIAGNOSIS — G8929 Other chronic pain: Secondary | ICD-10-CM

## 2017-12-09 MED ORDER — OXYCODONE HCL 5 MG PO TABS
10.0000 mg | ORAL_TABLET | Freq: Once | ORAL | Status: AC
  Administered 2017-12-09: 10 mg via ORAL
  Filled 2017-12-09: qty 2

## 2017-12-09 MED ORDER — HYDROMORPHONE HCL 1 MG/ML IJ SOLN
1.0000 mg | Freq: Once | INTRAMUSCULAR | Status: AC
  Administered 2017-12-09: 1 mg via INTRAMUSCULAR
  Filled 2017-12-09: qty 1

## 2017-12-09 MED ORDER — CYCLOBENZAPRINE HCL 10 MG PO TABS
10.0000 mg | ORAL_TABLET | Freq: Once | ORAL | Status: AC
  Administered 2017-12-09: 10 mg via ORAL
  Filled 2017-12-09: qty 1

## 2017-12-09 NOTE — Telephone Encounter (Signed)
I have discussed with Dr. Louanne Skye and he also agrees that patient has no indications for emergent MRI.  Patient cannot expect to bully his way into getting an MRI.  He's welcome to visit the ED if he chooses to.

## 2017-12-09 NOTE — Telephone Encounter (Signed)
Patient also stated that he is concerned for his safety because of him not having the tools to get around with issue that he is having right now.  He is afraid that he will fall and his wife will not be able to pick him up.  He went to [facility] last night and waited 15 hours before he was seen and then they told him that they could not do the MRI and that he needed to contact us to get it done.  Thank you.

## 2017-12-09 NOTE — Telephone Encounter (Signed)
Patient came into the clinic stating that the Cortisone injection did not help at all and he is in severe pain.  He is wanting to get his MRI done ASAP.  CB#[phone].  Thank you.

## 2017-12-09 NOTE — Telephone Encounter (Signed)
Patient calling [facility] reiterating what Katharine Look noted about the injection he had making his pain in his arm way more severe and not having the support he needs regarding his disbilities. He is requesting an MRI and want's to make sure it's covered by his insurance. The patient is unsure if Dr. Erlinda Hong will put the order in for him but he's not eating or sleeping and would like to hear back as soon as possible.

## 2017-12-09 NOTE — Telephone Encounter (Signed)
See messages below.  Please advise.  Thank you.

## 2017-12-09 NOTE — ED Notes (Signed)
Patient c/o right shoulder pain onset 3 months ago, states she was seen by ortho yest and had injection, states pain is worse and he wants an MRI, patient is upset stating he only has 1 arm and he doesn't want to lose it. Left arm shortened at the elbow

## 2017-12-09 NOTE — Telephone Encounter (Signed)
Pt called and states that the hospital and Dr. Erlinda Hong will not do anything for his pain. He states tha this is an emergency and he needs an MRI today. He states that he does not feel safe to stay at home by himself and that he is at risk for falls and that he is going to go back to the emergency room if we refuse to help him.  I advised the pt that the ER and Dr. Erlinda Hong had spoken with each other and that he wanted to give the injection a few weeks to see if this would help with his pain but that he wanted to hold on the MRI until after that time. Trying conservative measures first. The pt sates that he will call his PCP and they will order this for him and it will be done this week.

## 2017-12-09 NOTE — Telephone Encounter (Signed)
Would have him hear from his Specialist who did the injection regarding ordering the imaging.  In terms of his ability to get around at home safely, they should also be able to help with this.  I am happy to order a mobility device for him if they are not taking care of this.  Has he heard from the specialist yet?

## 2017-12-09 NOTE — Telephone Encounter (Signed)
Caller name: Ka[place] Relationship to patient: self Can be reached: [phone]  Reason for call: Pt stating he shouldn't have to suffer - he is amputee, Weak, tired, BP thru the roof, spent 15 hours in ER between last night/this morning - ER won't do MRI per pt - Dr. Erlinda Hong will not do the MRI since inj yesterday - won't authorize for 2-3 weeks - pt said pain clinic will only dispense meds (pt has appt 2/20)

## 2017-12-09 NOTE — Telephone Encounter (Signed)
Patient states that he has not heard from specialist.  He said that he is in surgery all day and will not see his messages until the end of the day.    He states that he is in a great deal of pain, and the pain medicine is not helping.    His pain clinic reached out to him and they want him to set up an appointment to come in and patient agreed, but they couldn't see him until the 20th.  He wants to know if there is any way that [name] can order the MRI and have this done as soon as possible.  Patient states that he has not slept in 3 days because the pain is so bad.

## 2017-12-09 NOTE — Telephone Encounter (Signed)
Message has been routed to Dr. Erlinda Hong - also routing to PCP so that he is aware.

## 2017-12-09 NOTE — Telephone Encounter (Signed)
He is welcome to go to another orthopedic practice if he chooses.

## 2017-12-09 NOTE — Telephone Encounter (Signed)
Patient just had injection yesterday so his increased pain is likely due to a postinjection flareup.  In my opinion emergent MRI is not indicated as patient has not given the injection enough time to see its true effectiveness and his condition is not emergent.  Patient's pain seems a bit dystrophic to me.  I instructed the patient to give me a call in a couple weeks if he is not seeing any relief from the injection.

## 2017-12-09 NOTE — ED Provider Notes (Signed)
[facility] EMERGENCY DEPARTMENT Provider Note   CSN: [id number] Arrival date & time: 12/08/17  1815     History   Chief Complaint Chief Complaint  Patient presents with  . Shoulder Pain  . Chest Pain   HPI Daniel Durham is a 52 y.o. male who presents with right shoulder pain. 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 He has acute on chronic pain in the right shoulder. He states that he believes he over used his shoulder while helping a friend which has worsened his pain. He believes it may be his rotator cuff because he has had this in the left shoulder before and it feels similar. The pain is over the entire right shoulder and radiates down to the hand. He reports associated numbness/tingling. He has weakness in his hand because it is swollen. He went to [facility] two days ago and had a xray of the cervical spine which showed DDD at C5-C6 an C6-C7. He was given rx for Lidocaine patches, Flexeril, and Prednisone. He followed up with Orthopedics and received a subacromial injection. This worsened his pain and caused him to come to the ED. 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 He is worried if he can't use his right arm as well that he will not be able to function.  HPI  Past Medical History:  Diagnosis Date  . Asthma    SEASONAL   . Chronic back pain    Neurostimulator  . Depression   . Environmental allergies   . Hypertension   . Neuropathy   . Seasonal allergic conjunctivitis     Patient Active Problem List   Diagnosis Date Noted  . Anxiety and depression 03/03/2017  . Cervical disc disorder with radiculopathy of cervical region 07/24/2015  . 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 Exacerbation of chronic back pain 03/20/2015  . 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 Visit for preventive health  examination 02/04/2015  . Chronic back pain greater than 3 months duration 01/23/2015  . 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 Amputation stump complication ([place]) [id number]/2878    Past Surgical History:  Procedure Laterality Date  . CARDIAC CATHETERIZATION     11/05/10 [facility], [place]): [place] of mLAD, no sign disease. EF 45% in RAO, 60% LAO (51% stress, 54% rest by NM stress; 57-59% echo 10/2010)  . CARPAL TUNNEL RELEASE     Bilateral  . HAND AMPUTATION  2007  . ROTATOR CUFF REPAIR     Left  . SPINAL CORD STIMULATOR IMPLANT    . SPINAL CORD STIMULATOR REMOVAL N/A 10/26/2015   Procedure: LUMBAR SPINAL CORD STIMULATOR REMOVAL;  Surgeon: Clydell Hakim, MD;  Location: [facility];  Service: Neurosurgery;  Laterality: N/A;       Home Medications    Prior to Admission medications   Medication Sig Start Date End Date Taking? Authorizing Provider  cyclobenzaprine (FLEXERIL) 10 MG tablet Take 1 tablet (10 mg total) by mouth 2 (two) times daily as needed for muscle spasms. 12/06/17   Langston Masker B, PA-C  fluticasone-salmeterol (ADVAIR HFA) 230-21 MCG/ACT inhaler Inhale 2 puffs into the lungs 2 (two) times daily. 01/31/15   Brunetta Jeans, PA-C  gabapentin (NEURONTIN) 600 MG tablet Take 600 mg by mouth 3 (three) times daily.    [provider]  hydrOXYzine (ATARAX/VISTARIL) 25 MG tablet Take 1 tablet (25 mg total) by mouth every 8 (eight) hours as needed for itching. Patient not taking: Reported on 11/06/2017 07/03/17   Saguier, Percell Miller, PA-C  Lidocaine 1.8 % PTCH Apply 1 patch topically daily. 12/06/17   Langston Masker B, PA-C  lisinopril (PRINIVIL,ZESTRIL) 20 MG tablet Take 1 tablet (20 mg total) by mouth daily. 12/24/16   Brunetta Jeans, PA-C  montelukast (SINGULAIR) 10 MG tablet TAKE 1 TABLET(10 MG) BY MOUTH AT BEDTIME 09/18/17   Brunetta Jeans, PA-C  Multiple Vitamins-Minerals (HM MULTIVITAMIN ADULT GUMMY) CHEW Chew 2 tablets by mouth daily.      [provider]  Oxycodone HCl 10 MG TABS Take 1 tablet (10 mg total) by mouth every 4 (four) hours. Patient taking differently: Take 10 mg by mouth. Take 1 tablet every 6 hours as needed for pain 08/10/15   Brunetta Jeans, PA-C  predniSONE (DELTASONE) 20 MG tablet Take 2 tablets (40 mg total) by mouth daily with breakfast for 4 days. 12/06/17 12/10/17  Langston Masker B, PA-C  sertraline (ZOLOFT) 25 MG tablet TAKE 1 TABLET(25 MG) BY MOUTH DAILY 09/18/17   Delorse Limber  VENTOLIN HFA 108 [phone] Base) MCG/ACT inhaler INHALE 2 PUFFS BY MOUTH EVERY 6 HOURS AS NEEDED FOR WHEEZING 02/16/17   Brunetta Jeans, PA-C    Family History Family History  Problem Relation Age of Onset  . Diabetes Mother        Living  . Sleep apnea Mother   . Diabetes Father 75       Deceased  . Hypertension Father   . Jaundice Father   . Hemophilia Father   . Alcoholism Father   . Cancer Maternal Grandfather        Throat  . Cancer Paternal Uncle        Throat  . Asthma Sister   . Diabetes Sister   . Healthy Son        X1  . Healthy Daughter        X1    Social History Social History   Tobacco Use  . Smoking status: Never Smoker  . Smokeless tobacco: Never Used  Substance Use Topics  . Alcohol use: Yes    Alcohol/week: 0.0 oz    Comment: OCCAS  . Drug use: No     Allergies   Aspirin and Hydrocodone   Review of Systems Review of Systems  Respiratory: Negative for shortness of breath.   Cardiovascular: Positive for chest pain.  Musculoskeletal: Positive for arthralgias and myalgias. Negative for back pain, gait problem and neck pain.  Neurological: Positive for weakness and numbness.  All other systems reviewed and are negative.    Physical Exam Updated Vital Signs BP (!) 148/93 (BP Location: Left Arm)   Pulse 80   Temp (!) 97.3 F (36.3 C) (Oral)   Resp 18   Ht 5\' 7"  (1.702 m)   Wt 113.4 kg (250 lb)   SpO2 100%   BMI 39.16 kg/m   Physical Exam  Constitutional:  He is oriented to person, place, and time. He appears well-developed and well-nourished. No distress.  HENT:  Head: Normocephalic and atraumatic.  Eyes: Conjunctivae are normal. Pupils are equal, round, and reactive to light. Right eye exhibits no discharge. Left eye exhibits no discharge. No scleral icterus.  Neck: Normal range of motion.  No midline cervical tenderness  Cardiovascular: Normal rate and regular rhythm. Exam reveals no gallop and no  friction rub.  No murmur heard. Pulmonary/Chest: Effort normal and breath sounds normal. No respiratory distress.  Abdominal: He exhibits no distension.  Musculoskeletal:  Right arm: No obvious swelling, deformity, or warmth. Diffuse tenderness. Pt will not let me range his shoulder. He is able to move his arm when talking to me. N/V intact.   Neurological: He is alert and oriented to person, place, and time.  Skin: Skin is warm and dry.  Psychiatric: He has a normal mood and affect. His behavior is normal.  Nursing note and vitals reviewed.   ED Treatments / Results  Labs (all labs ordered are listed, but only abnormal results are displayed) Labs Reviewed  BASIC METABOLIC PANEL - Abnormal; Notable for the following components:      Result Value   Glucose, Bld 107 (*)    All other components within normal limits  CBC - Abnormal; Notable for the following components:   WBC 11.0 (*)    All other components within normal limits  I-STAT TROPONIN, ED    EKG  EKG Interpretation None       Radiology Dg Chest 2 View  Result Date: 12/08/2017 CLINICAL DATA:  Shoulder pain for 3 months.  Shortness of breath. EXAM: CHEST  2 VIEW COMPARISON:  January 11, 2015 FINDINGS: The heart size and mediastinal contours are within normal limits. Both lungs are clear. The visualized skeletal structures are unremarkable. IMPRESSION: No active cardiopulmonary disease. Electronically Signed   By: Dorise Bullion III M.D   On: 12/08/2017 20:40   Dg Shoulder  Right  Result Date: 12/08/2017 CLINICAL DATA:  Pain for 3 months.  No injury. EXAM: RIGHT SHOULDER - 2+ VIEW COMPARISON:  December 06, 2017 FINDINGS: There is no evidence of fracture or dislocation. There is no evidence of arthropathy or other focal bone abnormality. Soft tissues are unremarkable. IMPRESSION: Negative. Electronically Signed   By: Dorise Bullion III M.D   On: 12/08/2017 20:41    Procedures Procedures (including critical care time)  Medications Ordered in ED Medications  oxyCODONE-acetaminophen (PERCOCET/ROXICET) 5-325 MG per tablet 1 tablet (1 tablet Oral Given 12/08/17 2223)  oxyCODONE (Oxy IR/ROXICODONE) immediate release tablet 10 mg (10 mg Oral Given 12/09/17 0646)  cyclobenzaprine (FLEXERIL) tablet 10 mg (10 mg Oral Given 12/09/17 0646)  HYDROmorphone (DILAUDID) injection 1 mg (1 mg Intramuscular Given 12/09/17 0840)     Initial Impression / Assessment and Plan / ED Course  I have reviewed the triage vital signs and the nursing notes.  Pertinent labs & imaging results that were available during my care of the patient were reviewed by me and considered in my medical decision making (see chart for details).  52 year old male presents with acute on chronic right shoulder pain. He is hypertensive but otherwise vitals are normal. There were several readings of low O2 (86%, 89%) which were likely recorded in error. Old records were reviewed. Pain is likely due to rotator cuff vs cervical radiculopathy or possible a combination. Exam was difficult due to the patient's pain - exacerbated by his chronic opioid use. He is upset because he waited 13 hours to be seen and will not be receiving a MRI . I tried my best to listen to the patient's complaints and talked to him about why we order an emergent MRI in the ED. I discussed with Dr. Tamera Punt who is in agreement with plan. I also spoke with the doctor who is on call for [facility] orthopedics and he recommends that the patient  call their office.  Referral information was given to the patient and pain control was given.   Final Clinical Impressions(s) / ED Diagnoses   Final diagnoses:  Chronic right shoulder pain    ED Discharge Orders    None       Recardo Evangelist, PA-C 12/09/17 6701    Malvin Johns, MD 12/09/17 1108

## 2017-12-09 NOTE — Telephone Encounter (Signed)
I cannot order an emergent MRI, will have to be certified through insurance which can take a few days.  I would recommend that he wait for a response from them regarding the MRI as they can likely get approved quicker since they recently have assessed the patient.   He sees pain management so there is nothing I can give as far as pain medication without affecting his contract with them. What all is he currently taking for his pain -- please get an itemized list so that I can see what I can do.

## 2017-12-09 NOTE — Telephone Encounter (Signed)
Per [facility] - patient called and stated he was going back to the ED for pain.   PCP is aware and will await notes from ED physicians.

## 2017-12-09 NOTE — Discharge Instructions (Signed)
Please call Dr. Phoebe Sharps office for follow up as soon as possible

## 2017-12-09 NOTE — Telephone Encounter (Signed)
Spent Literally 10 minutes on the phone with patient. He is very upset and frustrated. State he's been at the ED twice and has been there for hours and only saw PA and has paid so much money. Unhappy with all the ED visits. ED only gave him Oxycodone and a shot of Dilaudid. States he should not have to suffer like this. He states that we are all telling him the same thing and only the Dr can make things differently. States if he needs to see another MD [facility] then he will because he needs to have something done ASAP. Advised him that I do apologize on how much pain he is having but he needs to give the injection some time and he said he cannot wait anymore. He is in so much pain and is taking Oxycodone 1 every 6 hours 4 times a day.   He would like to speak to Dr regarding all this.  CB NUMBER: [phone]  Please call patient.

## 2017-12-09 NOTE — Telephone Encounter (Signed)
See message below °

## 2017-12-10 ENCOUNTER — Emergency Department (HOSPITAL_COMMUNITY)
Admission: EM | Admit: 2017-12-10 | Discharge: 2017-12-10 | Disposition: A | Discharge status: Home / Self Care | Payer: Medicare HMO | Attending: Emergency Medicine | Admitting: Emergency Medicine

## 2017-12-10 ENCOUNTER — Encounter: Payer: Self-pay | Admitting: Physician Assistant

## 2017-12-10 ENCOUNTER — Ambulatory Visit (INDEPENDENT_AMBULATORY_CARE_PROVIDER_SITE_OTHER): Payer: Medicare HMO | Admitting: Physician Assistant

## 2017-12-10 ENCOUNTER — Encounter: Payer: Self-pay | Admitting: Emergency Medicine

## 2017-12-10 ENCOUNTER — Other Ambulatory Visit: Payer: Self-pay

## 2017-12-10 ENCOUNTER — Encounter (HOSPITAL_COMMUNITY): Payer: Self-pay | Admitting: *Deleted

## 2017-12-10 ENCOUNTER — Telehealth: Payer: Self-pay | Admitting: Physician Assistant

## 2017-12-10 ENCOUNTER — Emergency Department (HOSPITAL_COMMUNITY): Payer: Medicare HMO

## 2017-12-10 VITALS — BP 130/98 | HR 81 | Temp 98.0°F | Resp 18 | Ht 67.0 in | Wt 250.0 lb

## 2017-12-10 DIAGNOSIS — G8929 Other chronic pain: Secondary | ICD-10-CM

## 2017-12-10 DIAGNOSIS — J45909 Unspecified asthma, uncomplicated: Secondary | ICD-10-CM | POA: Diagnosis not present

## 2017-12-10 DIAGNOSIS — I10 Essential (primary) hypertension: Secondary | ICD-10-CM | POA: Diagnosis not present

## 2017-12-10 DIAGNOSIS — M549 Dorsalgia, unspecified: Secondary | ICD-10-CM | POA: Insufficient documentation

## 2017-12-10 DIAGNOSIS — R451 Restlessness and agitation: Secondary | ICD-10-CM | POA: Diagnosis not present

## 2017-12-10 DIAGNOSIS — R0789 Other chest pain: Secondary | ICD-10-CM

## 2017-12-10 DIAGNOSIS — M25511 Pain in right shoulder: Secondary | ICD-10-CM | POA: Diagnosis not present

## 2017-12-10 DIAGNOSIS — Z79899 Other long term (current) drug therapy: Secondary | ICD-10-CM | POA: Diagnosis not present

## 2017-12-10 DIAGNOSIS — R52 Pain, unspecified: Secondary | ICD-10-CM | POA: Diagnosis not present

## 2017-12-10 LAB — BASIC METABOLIC PANEL
Anion gap: 13 (ref 5–15)
BUN: 16 mg/dL (ref 6–20)
CHLORIDE: 104 mmol/L (ref 101–111)
CO2: 21 mmol/L — ABNORMAL LOW (ref 22–32)
Calcium: 9.1 mg/dL (ref 8.9–10.3)
Creatinine, Ser: 1.21 mg/dL (ref 0.61–1.24)
GFR calc non Af Amer: >60 mL/min (ref >60)
Glucose, Bld: 100 mg/dL — ABNORMAL HIGH (ref 65–99)
Potassium: 3.7 mmol/L (ref 3.5–5.1)
SODIUM: 138 mmol/L (ref 135–145)

## 2017-12-10 LAB — CBC
HEMATOCRIT: 49 % (ref 39.0–52.0)
Hemoglobin: 17.1 g/dL — ABNORMAL HIGH (ref 13.0–17.0)
MCH: 33.7 pg (ref 26.0–34.0)
MCHC: 34.9 g/dL (ref 30.0–36.0)
MCV: 96.6 fL (ref 78.0–100.0)
Platelets: 206 10*3/uL (ref 150–400)
RBC: 5.07 MIL/uL (ref 4.22–5.81)
RDW: 13 % (ref 11.5–15.5)
WBC: 13.5 10*3/uL — AB (ref 4.0–10.5)

## 2017-12-10 LAB — I-STAT TROPONIN, ED: Troponin i, poc: 0 ng/mL (ref 0.00–0.08)

## 2017-12-10 MED ORDER — LIDOCAINE 5 % EX PTCH: 1.0000 | MEDICATED_PATCH | CUTANEOUS | 0 refills | Status: DC

## 2017-12-10 MED ORDER — CYCLOBENZAPRINE HCL 10 MG PO TABS
10.0000 mg | ORAL_TABLET | Freq: Once | ORAL | Status: AC
  Administered 2017-12-10: 10 mg via ORAL
  Filled 2017-12-10: qty 1

## 2017-12-10 MED ORDER — OXYCODONE HCL 5 MG PO TABS: 10.0000 mg | ORAL_TABLET | Freq: Once | ORAL | Status: DC

## 2017-12-10 MED ORDER — KETOROLAC TROMETHAMINE 30 MG/ML IJ SOLN
30.0000 mg | Freq: Once | INTRAMUSCULAR | Status: AC
  Administered 2017-12-10: 30 mg via INTRAVENOUS
  Filled 2017-12-10: qty 1

## 2017-12-10 NOTE — ED Notes (Signed)
PT in room waiting on prescription. Doctor is with another patient at this time.

## 2017-12-10 NOTE — Telephone Encounter (Signed)
Copied from [place] [facility]. Topic: Quick Communication - See Telephone Encounter >> Dec 10, 2017  4:24 PM Cleaster Corin, NT wrote: CRM for notification. See Telephone encounter for:   12/10/17. Bethany at [facility] just got of the phone with Dr. In ed at [facility] and will give the pt. A call back

## 2017-12-10 NOTE — Discharge Instructions (Signed)
Your primary care office will call you regarding the outpatient MRI.

## 2017-12-10 NOTE — ED Provider Notes (Signed)
I have personally seen and examined the patient. I have reviewed the documentation on PMH/FH/Soc Hx. I have discussed the plan of care with the resident and patient.  I have reviewed and agree with the resident's documentation. Please see associated encounter note.  Briefly, the patient is a 52 y.o. male here with chronic back pain on narcotic meds here for 2 months of right shoulder pain, possibly 2/2 rotator cuff injury.  Saw ortho today and got an intra-articular steroid shot.  Patient began having worsening pain and was seen at his primary care afterward demanding an MRI. He is here for continued pain. No additional trauma, fevers, chills. States that dilaudid makes pain improve. Exam TTP of the right trapezius muscle with spasm. Present suspicious for muscle strain/spasm versus disc herniation.  no need for emergent imaging at this time. No narcotic meds will be provided. Also complained of atypical chest pain. ekg reassuring. Chest x-ray without evidence suggestive of pneumonia, pneumothorax, pneumomediastinum.  No abnormal contour of the mediastinum to suggest dissection. No evidence of acute injuries. intial trop negative. Low suspicion for ACS, PE, dissection, or perforation.     EKG Interpretation  Date/Time:  Thursday December 10 2017 13:27:34 EST Ventricular Rate:  87 PR Interval:  136 QRS Duration: 84 QT Interval:  376 QTC Calculation: 452 R Axis:   -47 Text Interpretation:  Normal sinus rhythm Left axis deviation Anterior infarct , age undetermined Abnormal ECG Artifact no STEMI Otherwise no significant change Confirmed by Addison Lank [phone]) on 12/10/2017 3:09:09 PM         Leonette Monarch Grayce Sessions, MD 12/12/17 [phone]

## 2017-12-10 NOTE — ED Triage Notes (Signed)
Pt coming from MD office and hurt his right shoulder working 2 months ago.  Pt has had rotator cuff surgery and not sure if it is that .  Pt was at [facility] and dilaudid did help.  Phantom pain in left upper extremity. EMS gave patient 176mcg of Fentanyl and 20g right hand.

## 2017-12-10 NOTE — ED Provider Notes (Signed)
[facility] EMERGENCY DEPARTMENT Provider Note   CSN: [id number] Arrival date & time: 12/10/17  1310     History   Chief Complaint No chief complaint on file.   HPI Daniel Durham is a 52 y.o. male.  Patient is a 52 year old male presenting with right shoulder pain.  PMH significant for HTN, cervical disc disease with radiculopathy, history of amputation left arm below elbow, chronic back pain with spinal cord stimulator, chronic opioid dependence, asthma, depression.  Patient was initially seen on 12/06/17 for right arm pain.  Cervical radiographs significant for degenerative changes at C5-C6 and C6-C7 without subluxations.  Patient was treated for rotator cuff tendinopathy and cervical radiculopathy with dose of Decadron in the ED followed by 4 days of prednisone.  Patient also given Flexeril for lidocaine patches.  Patient was then seen by orthopedic surgeon on 12/08/17 and received subacromial injection with subsequent worsening of pain prompting revisit to ED on 12/09/17.  Patient given dose of Percocet and OxyIR followed by Flexeril and 1 mg of Dilaudid and instructed to follow-up with [facility].  Patient insistent on MRI but not performed.  Patient told PCP in the ED due to inadequately controlled pain.  MRI was ordered by PCP but not yet performed.      Past Medical History:  Diagnosis Date  . Asthma    SEASONAL   . Chronic back pain    Neurostimulator  . Depression   . Environmental allergies   . Hypertension   . Neuropathy   . Seasonal allergic conjunctivitis     Patient Active Problem List   Diagnosis Date Noted  . Anxiety and depression 03/03/2017  . Cervical disc disorder with radiculopathy of cervical region 07/24/2015  . 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 Exacerbation of chronic back pain 03/20/2015  . Amputation of arm below elbow, left ([place]) 02/04/2015  . Visit for preventive health  examination 02/04/2015  . Chronic back pain greater than 3 months duration 01/23/2015  . Spinal cord stimulator dysfunction ([place]) 01/23/2015  . Amputation stump complication ([place]) [id number]/9509    Past Surgical History:  Procedure Laterality Date  . BACK SURGERY    . CARDIAC CATHETERIZATION     11/05/10 [facility] [place]): [place] of mLAD, no sign disease. EF 45% in RAO, 60% LAO (51% stress, 54% rest by NM stress; 57-59% echo 10/2010)  . CARPAL TUNNEL RELEASE     Bilateral  . HAND AMPUTATION  2007  . ROTATOR CUFF REPAIR     Left  . SPINAL CORD STIMULATOR IMPLANT    . SPINAL CORD STIMULATOR REMOVAL N/A 10/26/2015   Procedure: LUMBAR SPINAL CORD STIMULATOR REMOVAL;  Surgeon: Clydell Hakim, MD;  Location: [facility];  Service: Neurosurgery;  Laterality: N/A;       Home Medications    Prior to Admission medications   Medication Sig Start Date End Date Taking? Authorizing Provider  cyclobenzaprine (FLEXERIL) 10 MG tablet Take 1 tablet (10 mg total) by mouth 2 (two) times daily as needed for muscle spasms. 12/06/17   Langston Masker B, PA-C  fluticasone-salmeterol (ADVAIR HFA) 230-21 MCG/ACT inhaler Inhale 2 puffs into the lungs 2 (two) times daily. 01/31/15   Brunetta Jeans, PA-C  gabapentin (NEURONTIN) 600 MG tablet Take 600 mg by mouth 3 (three) times daily.    [provider]  lisinopril (PRINIVIL,ZESTRIL) 20 MG tablet Take 1 tablet (20 mg total) by mouth daily. 12/24/16   Brunetta Jeans,  PA-C  montelukast (SINGULAIR) 10 MG tablet TAKE 1 TABLET(10 MG) BY MOUTH AT BEDTIME 09/18/17   Brunetta Jeans, PA-C  Multiple Vitamins-Minerals (HM MULTIVITAMIN ADULT GUMMY) CHEW Chew 2 tablets by mouth daily.     [provider]  Oxycodone HCl 10 MG TABS Take 1 tablet (10 mg total) by mouth every 4 (four) hours. Patient taking differently: Take 10 mg by mouth. Take 1 tablet every 6 hours as needed for pain 08/10/15   Raiford Noble C, PA-C  sertraline (ZOLOFT) 25 MG  tablet TAKE 1 TABLET(25 MG) BY MOUTH DAILY 09/18/17   Brunetta Jeans, PA-C  VENTOLIN HFA 108 [phone] Base) MCG/ACT inhaler INHALE 2 PUFFS BY MOUTH EVERY 6 HOURS AS NEEDED FOR WHEEZING 02/16/17   Brunetta Jeans, PA-C    Family History Family History  Problem Relation Age of Onset  . Diabetes Mother        Living  . Sleep apnea Mother   . Diabetes Father 80       Deceased  . Hypertension Father   . Jaundice Father   . Hemophilia Father   . Alcoholism Father   . Cancer Maternal Grandfather        Throat  . Cancer Paternal Uncle        Throat  . Asthma Sister   . Diabetes Sister   . Healthy Son        X1  . Healthy Daughter        X1    Social History Social History   Tobacco Use  . Smoking status: Never Smoker  . Smokeless tobacco: Never Used  Substance Use Topics  . Alcohol use: Yes    Alcohol/week: 0.0 oz    Comment: OCCAS  . Drug use: No     Allergies   Aspirin and Hydrocodone   Review of Systems Review of Systems  Constitutional: Negative for chills and fever.  HENT: Negative for ear pain and sore throat.   Eyes: Negative for pain and visual disturbance.  Respiratory: Negative for cough and shortness of breath.   Cardiovascular: Negative for chest pain and palpitations.  Gastrointestinal: Negative for abdominal pain and vomiting.  Genitourinary: Negative for dysuria and hematuria.  Musculoskeletal: Positive for arthralgias. Negative for back pain, joint swelling, neck pain and neck stiffness.  Skin: Negative for color change and rash.  Neurological: Negative for dizziness, syncope, weakness and headaches.  Psychiatric/Behavioral: Positive for agitation. The patient is nervous/anxious.   All other systems reviewed and are negative.    Physical Exam Updated Vital Signs BP (!) 151/95   Pulse 77   Temp 97.8 F (36.6 C) (Oral)   Resp (!) 23   SpO2 95%   Physical Exam  Constitutional: He appears well-developed and well-nourished.  HENT:  Head:  Normocephalic and atraumatic.  Eyes: Conjunctivae and EOM are normal. Pupils are equal, round, and reactive to light.  Neck: Neck supple.  Cardiovascular: Normal rate and regular rhythm.  No murmur heard. Pulmonary/Chest: Effort normal and breath sounds normal. No respiratory distress.  Abdominal: Soft. There is no tenderness.  Musculoskeletal: He exhibits no edema.  Tenderness to right trapezius and right shoulder extending down to distal arm, 3/5 motor strength on right arm with minimal passive ROM on exam limited to pain though patient able to move arm during interview  Lymphadenopathy:    He has no cervical adenopathy.  Neurological: He is alert.  Skin: Skin is warm and dry.  Psychiatric:  Agitated, avoiding eye  contact  Nursing note and vitals reviewed.    ED Treatments / Results  Labs (all labs ordered are listed, but only abnormal results are displayed) Labs Reviewed  BASIC METABOLIC PANEL - Abnormal; Notable for the following components:      Result Value   CO2 21 (*)    Glucose, Bld 100 (*)    All other components within normal limits  CBC - Abnormal; Notable for the following components:   WBC 13.5 (*)    Hemoglobin 17.1 (*)    All other components within normal limits  I-STAT TROPONIN, ED    EKG  EKG Interpretation  Date/Time:  Thursday December 10 2017 13:27:34 EST Ventricular Rate:  87 PR Interval:  136 QRS Duration: 84 QT Interval:  376 QTC Calculation: 452 R Axis:   -47 Text Interpretation:  Normal sinus rhythm Left axis deviation Anterior infarct , age undetermined Abnormal ECG Artifact no STEMI Otherwise no significant change Confirmed by Addison Lank [phone]) on 12/10/2017 3:09:09 PM       Radiology Dg Chest 2 View  Result Date: 12/10/2017 CLINICAL DATA:  Right chest and shoulder pain for 2 months. Hypertension today. EXAM: CHEST  2 VIEW COMPARISON:  PA and lateral chest 12/08/2017. Single-view of the chest 01/11/2015. FINDINGS: Lung volumes are  lower than on the comparison examinations with mild basilar atelectasis and accentuation of the cardiac silhouette. No focal airspace disease or edema. No pneumothorax or effusion. No focal bony abnormality. Postoperative change left shoulder noted. IMPRESSION: No acute finding in a low volume chest. Electronically Signed   By: Inge Rise M.D.   On: 12/10/2017 14:21   Dg Chest 2 View  Result Date: 12/08/2017 CLINICAL DATA:  Shoulder pain for 3 months.  Shortness of breath. EXAM: CHEST  2 VIEW COMPARISON:  January 11, 2015 FINDINGS: The heart size and mediastinal contours are within normal limits. Both lungs are clear. The visualized skeletal structures are unremarkable. IMPRESSION: No active cardiopulmonary disease. Electronically Signed   By: Dorise Bullion III M.D   On: 12/08/2017 20:40   Dg Shoulder Right  Result Date: 12/08/2017 CLINICAL DATA:  Pain for 3 months.  No injury. EXAM: RIGHT SHOULDER - 2+ VIEW COMPARISON:  December 06, 2017 FINDINGS: There is no evidence of fracture or dislocation. There is no evidence of arthropathy or other focal bone abnormality. Soft tissues are unremarkable. IMPRESSION: Negative. Electronically Signed   By: Dorise Bullion III M.D   On: 12/08/2017 20:41    Procedures Procedures (including critical care time)  Medications Ordered in ED Medications  cyclobenzaprine (FLEXERIL) tablet 10 mg (10 mg Oral Given 12/10/17 1625)  ketorolac (TORADOL) 30 MG/ML injection 30 mg (30 mg Intravenous Given 12/10/17 1625)     Initial Impression / Assessment and Plan / ED Course  I have reviewed the triage vital signs and the nursing notes.  Pertinent labs & imaging results that were available during my care of the patient were reviewed by me and considered in my medical decision making (see chart for details).    Patient is a 52 year old male presenting with right shoulder pain.  PMH significant for HTN, cervical disc disease with radiculopathy, history of amputation left  arm below elbow, chronic back pain with spinal cord stimulator, chronic opioid dependence, asthma, depression.  Patient presenting with significant right shoulder pain following visit from PCP.  He denies active chest pain or shortness of breath.  Vital significant for hypertension 140/88 without tachycardia, otherwise unremarkable.  EKG NSR without ST  changes or T wave abnormalities.  I-STAT troponin 0.  CBC with mild leukocytosis 13.5 likely secondary to steroid use.  BMET unremarkable.  CXR unremarkable.  Patient subjectively appears to be in significant pain especially with manipulation of any ROM with right arm including fingers.  Her chart review, pain thought to be either rotator cuff related or possible cervical radiculopathy based on degenerative changes to cervical spine.  Neurovascular intact.  Patient states his PCP told him that "he was not to leave until he received an MRI."  Future MRI ordered by PCP.  Patient given Flexeril 10 mg and Toradol 30 mg injection.  Reached out to PCP office states patient was likely going to be scheduled for an out patient MRI later this evening but was endorsing some intermittent chest pain prompting the office to call EMS and subsequent ED visit.  Patient has been without chest pain or shortness of breath with unremarkable workup.  Discussed with patient and family there is no indication for emergent MRI.  ECP office working on scheduling outpatient MRI likely tonight or tomorrow.  Discussed with patient who is pleased and cleared for discharge.  Final Clinical Impressions(s) / ED Diagnoses   Final diagnoses:  Chronic right shoulder pain    ED Discharge Orders    None       Teachey Bing, DO 12/10/17 1632

## 2017-12-10 NOTE — ED Triage Notes (Signed)
Patient also states chest pain.  Rt radial pulse present upon palpation

## 2017-12-10 NOTE — Telephone Encounter (Signed)
LM for patient to let him know that his insurance approved the MRI. Advised that [facility] has his information, and that they would be calling him any minute to schedule imaging.

## 2017-12-10 NOTE — Progress Notes (Signed)
Patient presents to clinic today c/o severe R shoulder pain radiating into R forearm and hand with intermittent numbness or tingling. Patient has been seen in the ER twice for this issue, first on 12/06/17 and then again on 12/08/17. ER assessment included imaging of cervical spine and R shoulder that were negative other than mild arthritic changes. Cardiac workups at that time negative. Patient was given prednisone in addition to chronic Gabapentin, Oxycodone and Flexeril. Endorses no relief. Was evaluated by his Orthopedist on 12/10/17 at which time he was given a cortisone injection. States pain has worsened significantly since this time. Has been attempting to reach orthopedics for MRI. Has been instructed that he needs to give prednisone time. Has also contacted his Pain specialist who has scheduled him to be seen on 12/23/17. They state they are unable to add on other agents presently per patient. Patient is requesting a STAT MRI as symptoms continue to worsen and he feels no one is helping. Denies anxiety but endorses intermittent chest pain at rest described as a pulling sensation. Denies abdominal pain, nausea or vomiting. Denies racing heart, lightheadedness or dizziness.  Past Medical History:  Diagnosis Date  . Asthma    SEASONAL   . Chronic back pain    Neurostimulator  . Depression   . Environmental allergies   . Hypertension   . Neuropathy   . Seasonal allergic conjunctivitis     Current Outpatient Medications on File Prior to Visit  Medication Sig Dispense Refill  . cyclobenzaprine (FLEXERIL) 10 MG tablet Take 1 tablet (10 mg total) by mouth 2 (two) times daily as needed for muscle spasms. 14 tablet 0  . fluticasone-salmeterol (ADVAIR HFA) 230-21 MCG/ACT inhaler Inhale 2 puffs into the lungs 2 (two) times daily. 3 Inhaler 1  . gabapentin (NEURONTIN) 600 MG tablet Take 600 mg by mouth 3 (three) times daily.    Marland Kitchen lisinopril (PRINIVIL,ZESTRIL) 20 MG tablet Take 1 tablet (20 mg total) by  mouth daily. 90 tablet 3  . montelukast (SINGULAIR) 10 MG tablet TAKE 1 TABLET(10 MG) BY MOUTH AT BEDTIME 90 tablet 1  . Multiple Vitamins-Minerals (HM MULTIVITAMIN ADULT GUMMY) CHEW Chew 2 tablets by mouth daily.     . Oxycodone HCl 10 MG TABS Take 1 tablet (10 mg total) by mouth every 4 (four) hours. (Patient taking differently: Take 10 mg by mouth. Take 1 tablet every 6 hours as needed for pain) 180 tablet 0  . sertraline (ZOLOFT) 25 MG tablet TAKE 1 TABLET(25 MG) BY MOUTH DAILY 90 tablet 0  . VENTOLIN HFA 108 (90 Base) MCG/ACT inhaler INHALE 2 PUFFS BY MOUTH EVERY 6 HOURS AS NEEDED FOR WHEEZING 18 g 3   No current facility-administered medications on file prior to visit.     Allergies  Allergen Reactions  . Aspirin Swelling  . Hydrocodone Itching    Family History  Problem Relation Age of Onset  . Diabetes Mother        Living  . Sleep apnea Mother   . Diabetes Father 65       Deceased  . Hypertension Father   . Jaundice Father   . Hemophilia Father   . Alcoholism Father   . Cancer Maternal Grandfather        Throat  . Cancer Paternal Uncle        Throat  . Asthma Sister   . Diabetes Sister   . Healthy Son        X1  . Healthy Daughter  X1    Social History   Socioeconomic History  . Marital status: Divorced    Spouse name: None  . Number of children: None  . Years of education: None  . Highest education level: None  Social Needs  . Financial resource strain: None  . Food insecurity - worry: None  . Food insecurity - inability: None  . Transportation needs - medical: None  . Transportation needs - non-medical: None  Occupational History  . None  Tobacco Use  . Smoking status: Never Smoker  . Smokeless tobacco: Never Used  Substance and Sexual Activity  . Alcohol use: Yes    Alcohol/week: 0.0 oz    Comment: OCCAS  . Drug use: No  . Sexual activity: Not Currently  Other Topics Concern  . None  Social History Narrative  . None   Review of  Systems - See HPI.  All other ROS are negative.  BP (!) 130/98   Pulse 81   Temp 98 F (36.7 C) (Oral)   Resp 18   Ht _0  (1.702 m)   Wt 250 lb (113.4 kg)   SpO2 98%   BMI 39.16 kg/m   Physical Exam  Constitutional: He is oriented to person, place, and time and well-developed, well-nourished, and in no distress.  HENT:  Head: Normocephalic and atraumatic.  Eyes: Conjunctivae are normal.  Neck: Neck supple.  Cardiovascular: Normal rate, regular rhythm, normal heart sounds and intact distal pulses.  Pulmonary/Chest: Effort normal and breath sounds normal. No respiratory distress. He has no wheezes. He has no rales. He exhibits no tenderness.  Musculoskeletal:       Right shoulder: He exhibits decreased range of motion (Patient endorses difficulty with ROM but will not allow me to perform exam maneuvers secondary to pain), tenderness and pain. He exhibits no bony tenderness.       Right elbow: He exhibits normal range of motion.       Cervical back: He exhibits spasm. He exhibits no tenderness and no bony tenderness.  Neurological: He is alert and oriented to person, place, and time.  Skin: Skin is warm and dry. No rash noted.  Psychiatric: His mood appears anxious. He is agitated. He does not exhibit a depressed mood.  Vitals reviewed.  Recent Results (from the past 2160 hour(s))  Basic metabolic panel     Status: Abnormal   Collection Time: 12/08/17  7:21 PM  Result Value Ref Range   Sodium 138 135 - 145 mmol/L   Potassium 4.1 3.5 - 5.1 mmol/L   Chloride 104 101 - 111 mmol/L   CO2 23 22 - 32 mmol/L   Glucose, Bld 107 (H) 65 - 99 mg/dL   BUN 20 6 - 20 mg/dL   Creatinine, Ser 1.07 0.61 - 1.24 mg/dL   Calcium 9.0 8.9 - 10.3 mg/dL   GFR calc non Af Amer >60 >60 mL/min   GFR calc Af Amer >60 >60 mL/min    Comment: (NOTE) The eGFR has been calculated using the CKD EPI equation. This calculation has not been validated in all clinical situations. eGFR's persistently <60 mL/min  signify possible Chronic Kidney Disease.    Anion gap 11 5 - 15    Comment: Performed at [facility] [address] [place], [place] [address]  CBC     Status: Abnormal   Collection Time: 12/08/17  7:21 PM  Result Value Ref Range   WBC 11.0 (H) 4.0 - 10.5 K/uL   RBC  4.81 4.22 - 5.81 MIL/uL   Hemoglobin 16.2 13.0 - 17.0 g/dL   HCT 46.9 39.0 - 52.0 %   MCV 97.5 78.0 - 100.0 fL   MCH 33.7 26.0 - 34.0 pg   MCHC 34.5 30.0 - 36.0 g/dL   RDW 13.0 11.5 - 15.5 %   Platelets 191 150 - 400 K/uL    Comment: Performed at [facility], [place] [address], [place], [place] [address]  I-stat troponin, ED     Status: None   Collection Time: 12/08/17  8:23 PM  Result Value Ref Range   Troponin i, poc 0.00 0.00 - 0.08 ng/mL   Comment 3            Comment: Due to the release kinetics of cTnI, a negative result within the first hours of the onset of symptoms does not rule out myocardial infarction with certainty. If myocardial infarction is still suspected, repeat the test at appropriate intervals.     Assessment/Plan: 1. Chronic pain in right shoulder Will attempt to order MRI to further assess. Was discussing changes to regimen but patient adamant that he wants to be seen in ER again due to severity of pain. Patient went to ER via EMS.  - MR Shoulder Right Wo Contrast; Future  2. Chest tightness or pressure EKG with sinus rhythm and RBBB. No acute changes since last EKG. Tightness is non-exertional. Lungs CTAB. Vitals checked multiple times and normal.  Seems related to panic attack. Patient is adamant on ER assessment. EMS called for assessment and to get him to the ER.  - EKG 12-Lead   Leeanne Rio, PA-C

## 2017-12-11 ENCOUNTER — Ambulatory Visit
Admission: RE | Admit: 2017-12-11 | Discharge: 2017-12-11 | Disposition: A | Discharge status: Home / Self Care | Payer: Medicare HMO | Source: Ambulatory Visit | Attending: Physician Assistant | Admitting: Physician Assistant

## 2017-12-11 DIAGNOSIS — M7580 Other shoulder lesions, unspecified shoulder: Secondary | ICD-10-CM | POA: Diagnosis not present

## 2017-12-11 DIAGNOSIS — G8929 Other chronic pain: Secondary | ICD-10-CM

## 2017-12-11 DIAGNOSIS — M25511 Pain in right shoulder: Principal | ICD-10-CM

## 2017-12-14 DIAGNOSIS — M542 Cervicalgia: Secondary | ICD-10-CM | POA: Diagnosis not present

## 2017-12-14 DIAGNOSIS — M25511 Pain in right shoulder: Secondary | ICD-10-CM | POA: Diagnosis not present

## 2017-12-15 ENCOUNTER — Telehealth: Payer: Self-pay | Admitting: Physician Assistant

## 2017-12-15 DIAGNOSIS — M542 Cervicalgia: Secondary | ICD-10-CM | POA: Diagnosis not present

## 2017-12-15 NOTE — Telephone Encounter (Signed)
Copied from [place] [phone]. Topic: Inquiry >> Dec 15, 2017  5:17 PM Daniel Durham wrote: Reason for CRM: pt called to let you know that he had mri from ortho and found out that he has a ruptured disc in his neck.  He will keep you updated of his condition  Cb is [phone]

## 2017-12-16 DIAGNOSIS — M5020 Other cervical disc displacement, unspecified cervical region: Secondary | ICD-10-CM | POA: Diagnosis not present

## 2017-12-16 DIAGNOSIS — I10 Essential (primary) hypertension: Secondary | ICD-10-CM | POA: Diagnosis not present

## 2017-12-16 DIAGNOSIS — M5412 Radiculopathy, cervical region: Secondary | ICD-10-CM | POA: Diagnosis not present

## 2017-12-16 DIAGNOSIS — Z6839 Body mass index (BMI) 39.0-39.9, adult: Secondary | ICD-10-CM | POA: Diagnosis not present

## 2017-12-16 NOTE — Telephone Encounter (Signed)
Notes have been seen by PCP.

## 2017-12-17 DIAGNOSIS — M50123 Cervical disc disorder at C6-C7 level with radiculopathy: Secondary | ICD-10-CM | POA: Diagnosis not present

## 2017-12-17 DIAGNOSIS — M5020 Other cervical disc displacement, unspecified cervical region: Secondary | ICD-10-CM | POA: Diagnosis not present

## 2017-12-17 HISTORY — PX: CERVICAL DISCECTOMY: SHX98

## 2017-12-22 ENCOUNTER — Telehealth: Payer: Self-pay | Admitting: Physician Assistant

## 2017-12-22 NOTE — Telephone Encounter (Signed)
Copied from [place]. Topic: Quick Communication - See Telephone Encounter >> Dec 22, 2017 10:02 AM Boyd Kerbs wrote: CRM for notification. See Telephone encounter for:   [employer] told him that PCP Hassell Done will be doing care management- wants to be sure Daniel Durham know this and is on board.   He also wanted to let Daniel Durham know he  had surgery last Thursday took out 2 vertebras and he is going to [facility] on 3-12 to follow up and  appt. With [facility] on 3/18  12/22/17.

## 2018-01-04 DIAGNOSIS — M5020 Other cervical disc displacement, unspecified cervical region: Secondary | ICD-10-CM | POA: Diagnosis not present

## 2018-01-18 ENCOUNTER — Encounter: Payer: Medicare PPO | Admitting: Physician Assistant

## 2018-01-18 DIAGNOSIS — Z0289 Encounter for other administrative examinations: Secondary | ICD-10-CM

## 2018-01-25 ENCOUNTER — Encounter: Payer: Medicare HMO | Admitting: Physician Assistant

## 2018-02-15 ENCOUNTER — Other Ambulatory Visit: Payer: Self-pay | Admitting: Emergency Medicine

## 2018-02-15 MED ORDER — ALBUTEROL SULFATE HFA 108 (90 BASE) MCG/ACT IN AERS: INHALATION_SPRAY | RESPIRATORY_TRACT | 3 refills | Status: DC

## 2018-02-16 ENCOUNTER — Telehealth: Payer: Self-pay | Admitting: Physician Assistant

## 2018-02-16 ENCOUNTER — Emergency Department (HOSPITAL_BASED_OUTPATIENT_CLINIC_OR_DEPARTMENT_OTHER): Payer: Medicare HMO

## 2018-02-16 ENCOUNTER — Emergency Department (HOSPITAL_BASED_OUTPATIENT_CLINIC_OR_DEPARTMENT_OTHER)
Admission: EM | Admit: 2018-02-16 | Discharge: 2018-02-16 | Disposition: A | Discharge status: Home / Self Care | Payer: Medicare HMO | Attending: Emergency Medicine | Admitting: Emergency Medicine

## 2018-02-16 ENCOUNTER — Other Ambulatory Visit: Payer: Self-pay

## 2018-02-16 ENCOUNTER — Encounter (HOSPITAL_BASED_OUTPATIENT_CLINIC_OR_DEPARTMENT_OTHER): Payer: Self-pay | Admitting: *Deleted

## 2018-02-16 DIAGNOSIS — K859 Acute pancreatitis without necrosis or infection, unspecified: Secondary | ICD-10-CM | POA: Insufficient documentation

## 2018-02-16 DIAGNOSIS — J45909 Unspecified asthma, uncomplicated: Secondary | ICD-10-CM | POA: Insufficient documentation

## 2018-02-16 DIAGNOSIS — R079 Chest pain, unspecified: Secondary | ICD-10-CM | POA: Insufficient documentation

## 2018-02-16 DIAGNOSIS — I10 Essential (primary) hypertension: Secondary | ICD-10-CM | POA: Insufficient documentation

## 2018-02-16 DIAGNOSIS — R0789 Other chest pain: Secondary | ICD-10-CM

## 2018-02-16 DIAGNOSIS — Z79899 Other long term (current) drug therapy: Secondary | ICD-10-CM | POA: Insufficient documentation

## 2018-02-16 DIAGNOSIS — R0602 Shortness of breath: Secondary | ICD-10-CM | POA: Insufficient documentation

## 2018-02-16 DIAGNOSIS — R42 Dizziness and giddiness: Secondary | ICD-10-CM | POA: Insufficient documentation

## 2018-02-16 LAB — BASIC METABOLIC PANEL
ANION GAP: 11 (ref 5–15)
BUN: 7 mg/dL (ref 6–20)
CALCIUM: 8.6 mg/dL — AB (ref 8.9–10.3)
CO2: 22 mmol/L (ref 22–32)
Chloride: 104 mmol/L (ref 101–111)
Creatinine, Ser: 0.96 mg/dL (ref 0.61–1.24)
GFR calc Af Amer: >60 mL/min (ref >60)
GLUCOSE: 98 mg/dL (ref 65–99)
POTASSIUM: 4.1 mmol/L (ref 3.5–5.1)
SODIUM: 137 mmol/L (ref 135–145)

## 2018-02-16 LAB — CBC
HEMATOCRIT: 43.9 % (ref 39.0–52.0)
HEMOGLOBIN: 15.1 g/dL (ref 13.0–17.0)
MCH: 32.9 pg (ref 26.0–34.0)
MCHC: 34.4 g/dL (ref 30.0–36.0)
MCV: 95.6 fL (ref 78.0–100.0)
Platelets: 152 10*3/uL (ref 150–400)
RBC: 4.59 MIL/uL (ref 4.22–5.81)
RDW: 13.8 % (ref 11.5–15.5)
WBC: 6.3 10*3/uL (ref 4.0–10.5)

## 2018-02-16 LAB — TROPONIN I

## 2018-02-16 LAB — HEPATIC FUNCTION PANEL
ALBUMIN: 4 g/dL (ref 3.5–5.0)
ALT: 61 U/L (ref 17–63)
AST: 60 U/L — AB (ref 15–41)
Alkaline Phosphatase: 77 U/L (ref 38–126)
BILIRUBIN DIRECT: 0.1 mg/dL (ref 0.1–0.5)
BILIRUBIN TOTAL: 0.6 mg/dL (ref 0.3–1.2)
Indirect Bilirubin: 0.5 mg/dL (ref 0.3–0.9)
Total Protein: 7.5 g/dL (ref 6.5–8.1)

## 2018-02-16 LAB — LIPASE, BLOOD: LIPASE: 94 U/L — AB (ref 11–51)

## 2018-02-16 LAB — D-DIMER, QUANTITATIVE (NOT AT ARMC): D DIMER QUANT: 0.76 ug{FEU}/mL — AB (ref 0.00–0.50)

## 2018-02-16 MED ORDER — OXYCODONE HCL 5 MG PO TABS
10.0000 mg | ORAL_TABLET | Freq: Once | ORAL | Status: AC
  Administered 2018-02-16: 10 mg via ORAL
  Filled 2018-02-16: qty 2

## 2018-02-16 MED ORDER — NAPROXEN 375 MG PO TABS: 375.0000 mg | ORAL_TABLET | Freq: Two times a day (BID) | ORAL | 0 refills | Status: DC

## 2018-02-16 MED ORDER — IOPAMIDOL (ISOVUE-370) INJECTION 76%
100.0000 mL | Freq: Once | INTRAVENOUS | Status: AC | PRN
  Administered 2018-02-16: 100 mL via INTRAVENOUS

## 2018-02-16 MED ORDER — PANTOPRAZOLE SODIUM 20 MG PO TBEC: 20.0000 mg | DELAYED_RELEASE_TABLET | Freq: Every day | ORAL | 0 refills | Status: DC

## 2018-02-16 NOTE — Telephone Encounter (Signed)
Spoke to pt, confirming appt for tomorrow 4/17 (CPE, 9AM). Pt mentioned possibility of going to ER today to get checked out. Stated he is experiencing dizziness, rapid heartbeat, had a fall earlier in the day, feeling of "heart going to beat out of my chest". Pt was noticeable out of breath on the phone but stated he was lying in bed. Pt stated he planned on being at the 9AM appt, unless in the ER.

## 2018-02-16 NOTE — ED Notes (Signed)
Pt reports that he had a sudden onset of chest pain while mowing the lawn. At onset pt became ShOB, nauseated, and had a syncopal episode. Pt has been having sternal tenderness for 3 days and is extremely tender to palpation, but pt states the CP that started today feels different. Pt states pain is non-radiating. Pt describes the pain as "sharp, aching, constant."

## 2018-02-16 NOTE — ED Provider Notes (Signed)
[facility] EMERGENCY DEPARTMENT Provider Note   CSN: [id number] Arrival date & time: 02/16/18  1614     History   Chief Complaint Chief Complaint  Patient presents with  . Chest Pain    HPI Daniel Durham is a 52 y.o. male.  HPI Patient reports he has had chest pain for approximately a week.  It is central and a deep pressure and aching kind of pain.  He reports also feeling short of breath for a week.  No documented fever.  No significant cough.  Patient reports that he was mowing the lawn today and he got off the mower and was walking toward the house and he started to feel somewhat lightheaded.  He reports that he did begin the past out but did not lose consciousness.  He contacted his primary care provider and he was advised to come to emergency department for assessment.  Ports he is chronically been having some problems since he had a cervical fusion 2 months ago.  He is felt like it is difficult to swallow although he has had no problems with eating or drinking.  He reports he has a very tender spot right in the center of his chest.  He indicates that region of his manubrium.  He reports that area hurts a lot if it is pressed on or he moves a certain way. Past Medical History:  Diagnosis Date  . Asthma    SEASONAL   . Chronic back pain    Neurostimulator  . Depression   . Environmental allergies   . Hypertension   . Neuropathy   . Seasonal allergic conjunctivitis     Patient Active Problem List   Diagnosis Date Noted  . Anxiety and depression 03/03/2017  . Cervical disc disorder with radiculopathy of cervical region 07/24/2015  . 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 Exacerbation of chronic back pain 03/20/2015  . 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 Visit for preventive health examination 02/04/2015  . Chronic back pain greater than 3 months duration 01/23/2015  . 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 Amputation stump complication ([place]) 24/26/8341    Past Surgical History:  Procedure Laterality Date  . BACK SURGERY    . CARDIAC CATHETERIZATION     11/05/10 [facility], [place]): [place] of mLAD, no sign disease. EF 45% in RAO, 60% LAO (51% stress, 54% rest by NM stress; 57-59% echo 10/2010)  . CARPAL TUNNEL RELEASE     Bilateral  . HAND AMPUTATION  2007  . ROTATOR CUFF REPAIR     Left  . SPINAL CORD STIMULATOR IMPLANT    . SPINAL CORD STIMULATOR REMOVAL N/A 10/26/2015   Procedure: LUMBAR SPINAL CORD STIMULATOR REMOVAL;  Surgeon: Clydell Hakim, MD;  Location: [facility];  Service: Neurosurgery;  Laterality: N/A;        Home Medications    Prior to Admission medications   Medication Sig Start Date End Date Taking? Authorizing Provider  albuterol (VENTOLIN HFA) 108 (90 Base) MCG/ACT inhaler INHALE 2 PUFFS BY MOUTH EVERY 6 HOURS AS NEEDED FOR WHEEZING 02/15/18   Brunetta Jeans, PA-C  cyclobenzaprine (FLEXERIL) 10 MG tablet Take 1 tablet (10 mg total) by mouth 2 (two) times daily as needed for muscle spasms. 12/06/17   Langston Masker B, PA-C  fluticasone-salmeterol (ADVAIR HFA) 230-21 MCG/ACT inhaler Inhale 2 puffs into the lungs 2 (two) times daily. 01/31/15   Brunetta Jeans,  PA-C  gabapentin (NEURONTIN) 600 MG tablet Take 600 mg by mouth 3 (three) times daily.    [provider]  lidocaine (LIDODERM) 5 % Place 1 patch onto the skin daily. Remove & Discard patch within 12 hours or as directed by MD 12/10/17   Cardama, Grayce Sessions, MD  lisinopril (PRINIVIL,ZESTRIL) 20 MG tablet Take 1 tablet (20 mg total) by mouth daily. 12/24/16   Brunetta Jeans, PA-C  montelukast (SINGULAIR) 10 MG tablet TAKE 1 TABLET(10 MG) BY MOUTH AT BEDTIME 09/18/17   Brunetta Jeans, PA-C  Multiple Vitamins-Minerals (HM MULTIVITAMIN ADULT GUMMY) CHEW Chew 2 tablets by mouth daily.     [provider]  naproxen (NAPROSYN) 375 MG tablet Take 1 tablet  (375 mg total) by mouth 2 (two) times daily. 02/16/18   Charlesetta Shanks, MD  Oxycodone HCl 10 MG TABS Take 1 tablet (10 mg total) by mouth every 4 (four) hours. Patient taking differently: Take 10 mg by mouth. Take 1 tablet every 6 hours as needed for pain 08/10/15   Brunetta Jeans, PA-C  pantoprazole (PROTONIX) 20 MG tablet Take 1 tablet (20 mg total) by mouth daily. 02/16/18   Charlesetta Shanks, MD  sertraline (ZOLOFT) 25 MG tablet TAKE 1 TABLET(25 MG) BY MOUTH DAILY 09/18/17   Brunetta Jeans, PA-C    Family History Family History  Problem Relation Age of Onset  . Diabetes Mother        Living  . Sleep apnea Mother   . Diabetes Father 65       Deceased  . Hypertension Father   . Jaundice Father   . Hemophilia Father   . Alcoholism Father   . Cancer Maternal Grandfather        Throat  . Cancer Paternal Uncle        Throat  . Asthma Sister   . Diabetes Sister   . Healthy Son        X1  . Healthy Daughter        X1    Social History Social History   Tobacco Use  . Smoking status: Never Smoker  . Smokeless tobacco: Never Used  Substance Use Topics  . Alcohol use: Yes    Alcohol/week: 0.0 oz    Comment: OCCAS  . Drug use: No     Allergies   Aspirin and Hydrocodone   Review of Systems Review of Systems 10 Systems reviewed and are negative for acute change except as noted in the HPI.   Physical Exam Updated Vital Signs BP 123/84 (BP Location: Left Arm)   Pulse 70   Temp 98.2 F (36.8 C) (Oral)   Resp (!) 22   Ht 5\' 7"  (1.702 m)   Wt 113.4 kg (250 lb)   SpO2 96%   BMI 39.16 kg/m   Physical Exam  Constitutional: He is oriented to person, place, and time. He appears well-developed and well-nourished.  HENT:  Head: Normocephalic and atraumatic.  Mouth/Throat: Oropharynx is clear and moist.  Eyes: Pupils are equal, round, and reactive to light. EOM are normal.  Neck: Neck supple.  Cardiovascular: Normal rate, regular rhythm, normal heart sounds and  intact distal pulses.  Pulmonary/Chest: Effort normal and breath sounds normal. He exhibits tenderness.  Patient is exquisitely tender over the central sternum and just slightly to the left side of the sternum.  Abdominal: Soft. Bowel sounds are normal. He exhibits no distension. There is no tenderness.  Musculoskeletal: Normal range of motion. He exhibits  no edema or tenderness.  Neurological: He is alert and oriented to person, place, and time. He has normal strength. No cranial nerve deficit. He exhibits normal muscle tone. Coordination normal. GCS eye subscore is 4. GCS verbal subscore is 5. GCS motor subscore is 6.  Skin: Skin is warm, dry and intact.  Psychiatric: He has a normal mood and affect.     ED Treatments / Results  Labs (all labs ordered are listed, but only abnormal results are displayed) Labs Reviewed  BASIC METABOLIC PANEL - Abnormal; Notable for the following components:      Result Value   Calcium 8.6 (*)    All other components within normal limits  LIPASE, BLOOD - Abnormal; Notable for the following components:   Lipase 94 (*)    All other components within normal limits  D-DIMER, QUANTITATIVE (NOT AT [facility]) - Abnormal; Notable for the following components:   D-Dimer, Quant 0.76 (*)    All other components within normal limits  HEPATIC FUNCTION PANEL - Abnormal; Notable for the following components:   AST 60 (*)    All other components within normal limits  CBC  TROPONIN I  TROPONIN I    EKG EKG Interpretation  Date/Time:  Tuesday February 16 2018 16:21:54 EDT Ventricular Rate:  80 PR Interval:  136 QRS Duration: 90 QT Interval:  350 QTC Calculation: 403 R Axis:   92 Text Interpretation:  Normal sinus rhythm Rightward axis Possible Anterior infarct , age undetermined Abnormal ECG inferior artifact. no STEMI no change from previous Confirmed by Charlesetta Shanks [phone]) on 02/16/2018 4:26:34 PM   Radiology Dg Chest 2 View  Result Date:  02/16/2018 CLINICAL DATA:  Sudden onset chest pain while mowing the lawn. Shortness of breath, nausea, and syncopal episode. EXAM: CHEST - 2 VIEW COMPARISON:  12/10/2017 and 12/08/2017 FINDINGS: The cardiac silhouette is borderline enlarged. The patient has taken a greater inspiration than on the most recent prior examination and there is improved aeration of the lung bases. No segmental consolidation, edema, pleural effusion, or pneumothorax is identified. Cervical spine fusion and left shoulder postoperative changes are noted. IMPRESSION: No active cardiopulmonary disease. Electronically Signed   By: Logan Bores M.D.   On: 02/16/2018 17:36   Ct Angio Chest Pe W/cm &/or Wo Cm  Result Date: 02/16/2018 CLINICAL DATA:  Sudden onset chest pain while mowing the lawn. Shortness of breath, nausea, and syncope. EXAM: CT ANGIOGRAPHY CHEST WITH CONTRAST TECHNIQUE: Multidetector CT imaging of the chest was performed using the standard protocol during bolus administration of intravenous contrast. Multiplanar CT image reconstructions and MIPs were obtained to evaluate the vascular anatomy. CONTRAST:  79 mL Isovue 370 COMPARISON:  Chest radiographs 02/16/2018 FINDINGS: Cardiovascular: Pulmonary arterial opacification is adequate without evidence of emboli. There is no evidence of thoracic aortic aneurysm or dissection. The heart is normal in size. There is no pericardial effusion. Mediastinum/Nodes: No enlarged axillary, mediastinal, or hilar lymph nodes. Unremarkable esophagus and thyroid. Lungs/Pleura: No pleural effusion or pneumothorax. Mild respiratory motion artifact and minimal dependent atelectasis bilaterally. Lungs otherwise clear. No mass. Upper Abdomen: Mild fatty infiltration of the pancreas. Musculoskeletal: Suture anchors in the left humeral head. Partially visualized C6-7 ACDF. Small T11 superior endplate Schmorl's node. Review of the MIP images confirms the above findings. IMPRESSION: No evidence of  pulmonary emboli or other acute abnormality in the chest. Electronically Signed   By: Logan Bores M.D.   On: 02/16/2018 18:50    Procedures Procedures (including critical care time)  Medications Ordered in ED Medications  iopamidol (ISOVUE-370) 76 % injection 100 mL (100 mLs Intravenous Contrast Given 02/16/18 1828)  oxyCODONE (Oxy IR/ROXICODONE) immediate release tablet 10 mg (10 mg Oral Given 02/16/18 1919)     Initial Impression / Assessment and Plan / ED Course  I have reviewed the triage vital signs and the nursing notes.  Pertinent labs & imaging results that were available during my care of the patient were reviewed by me and considered in my medical decision making (see chart for details).      Final Clinical Impressions(s) / ED Diagnoses   Final diagnoses:  Other chest pain  Shortness of breath  Lightheadedness  Acute pancreatitis without infection or necrosis, unspecified pancreatitis type   Patient has had a week's worth of symptoms of chest pain and shortness of breath.  CT rules out PE or other intrathoracic etiology such as pneumonia.  2 sets of cardiac enzymes are negative.  History is not suggestive of ACS.  Pain has been continuous.  Patient has very reproducible sternal pain.  It does seem musculoskeletal.  Some of the pain also does come from the epigastrium.  Patient has very mildly elevated lipase.  CT does not show inflammatory changes.  She is counseled on management of pancreatitis.  Early pancreatitis is within the differential diagnosis.  I do however feel patient stable for home management.  He is alert and in no acute distress.  She has follow-up tomorrow morning.  At this time, plan will be to start a PPI and NSAID.  Return precautions reviewed. ED Discharge Orders        Ordered    naproxen (NAPROSYN) 375 MG tablet  2 times daily     02/16/18 2015    pantoprazole (PROTONIX) 20 MG tablet  Daily     02/16/18 2015       Charlesetta Shanks, MD 02/17/18  0015

## 2018-02-16 NOTE — Telephone Encounter (Signed)
Please call Daniel Durham, he needs ER assessment now given mentioned symptoms. Recommend 911.

## 2018-02-16 NOTE — Discharge Instructions (Signed)
1.  Take Protonix daily as prescribed. 2.  Take naproxen twice daily as prescribed for pain in your sternum. 3.  You have a mild elevation in your labs for the pancreas.  It does not show inflammation on the CT scan.  You need to carefully follow the diet for pancreatitis.  This consists of mostly clear fluids and no fat foods.  No alcohol. 4.  See your doctor tomorrow as scheduled.

## 2018-02-16 NOTE — Telephone Encounter (Signed)
Called patient and he stated that he was mowing the lawn earlier and when he finished and got off of the lawnmower that he became really dizzy and felt like his heart was racing. He stated that he called Dianna and she came to help him and she checked his BP and was 123/77, repeated after a few minutes and BP was 127/84 and his pulse was 75.   I advised to the patient that he needs to go to the ER and/or call 911. He stated that Peter Congo is going to take him the the [facility] and he also stated that he wants to keep his 9am appointment with [facility] on 02/17/18. I advised to please let us know if he needs to cancel this appointment if anything happens or if he can.   Patient also stated that since he has had titanium plates put in his neck that he has been having migraines ever since and that he has multiple things to discuss with [facility].

## 2018-02-16 NOTE — ED Triage Notes (Signed)
Pt c/o left sided chest pain with SOB and nausea

## 2018-02-17 ENCOUNTER — Encounter: Payer: Self-pay | Admitting: Physician Assistant

## 2018-02-17 ENCOUNTER — Ambulatory Visit (INDEPENDENT_AMBULATORY_CARE_PROVIDER_SITE_OTHER): Payer: Medicare HMO | Admitting: Physician Assistant

## 2018-02-17 VITALS — BP 124/82 | HR 76 | Temp 98.5°F | Resp 16 | Ht 67.0 in | Wt 252.0 lb

## 2018-02-17 DIAGNOSIS — K859 Acute pancreatitis without necrosis or infection, unspecified: Secondary | ICD-10-CM

## 2018-02-17 DIAGNOSIS — M5010 Cervical disc disorder with radiculopathy, unspecified cervical region: Secondary | ICD-10-CM

## 2018-02-17 DIAGNOSIS — M940 Chondrocostal junction syndrome [Tietze]: Secondary | ICD-10-CM

## 2018-02-17 DIAGNOSIS — R0602 Shortness of breath: Secondary | ICD-10-CM | POA: Diagnosis not present

## 2018-02-17 DIAGNOSIS — F419 Anxiety disorder, unspecified: Secondary | ICD-10-CM

## 2018-02-17 DIAGNOSIS — F329 Major depressive disorder, single episode, unspecified: Secondary | ICD-10-CM | POA: Diagnosis not present

## 2018-02-17 DIAGNOSIS — F32A Depression, unspecified: Secondary | ICD-10-CM

## 2018-02-17 LAB — TSH: TSH: 4.48 u[IU]/mL (ref 0.35–4.50)

## 2018-02-17 LAB — LIPASE: Lipase: 24 U/L (ref 11.0–59.0)

## 2018-02-17 NOTE — Patient Instructions (Addendum)
Please stay well-hydrated and get plenty of rest.  Make sure you are eating a well-balanced diet and not skipping meals.  I am checking a few labs today to further assess things.  I would like you to be set up for an Echocardiogram (Ultrasound of your heart), giving the windedness with exertion.   Please continue chronic medications as directed. I am looking into alternative providers for you so we can work on pain from a different angle.  I will call you next week to discuss.   Make sure to follow-up with your Neurosurgeon as scheduled and mention the neck symptoms.  For the costochondritis, please take the Naproxen as directed. The other medication given by the ER is to prevent reflux from using the Naproxen. Take this as directed. Avoid late night eating.   Follow-up will be based on lab results and how your sternal pain does with the current treatment.  Avoid heavy lifting or overexertion.

## 2018-02-17 NOTE — Progress Notes (Signed)
Patient presents to clinic today initially for CPE but was seen in the ER yesterday and has multiple concerns today.   Patient presented to the ER yesterday with c/o 1 week of chest pain associated with shortness of breath on exertion and epigastric pain. ER workup including labs (normal CMP, normal troponin, elevated d-dimer, mild elevation in lipase and AST), EKG (NSR with RAD, unchanged from previous), CXR (no active cardiopulmonary disease) and CTA (negative for PE or other acute abnormality). Pain was reproduced with pressing on the sternum. Patient was given Oxycodone with significant improvement in pain. Patient was discharged on 02/17/18 and with instruction to follow-up here in office.   Since discharge yesterday, patient endorses pain is improved somewhat but still present. Is still worsened with palpation of the area. Denies radiation of pain presently. Denies abdominal pain, nausea or vomiting. Is hydrating well. Appetite is improving but still skipping meals. Is noting SOB on exertion only. States he feels winded walking from the house to the mailbox and back.   Patient also would like to discuss alternative therapies for chronic pain as he does not want to have to rely on narcotic pain medications if possible.  Patient is still followed by Neurosurgery.  Patient is requesting updated form for handicap placard.   Past Medical History:  Diagnosis Date  . Asthma    SEASONAL   . Chronic back pain    Neurostimulator  . Depression   . Environmental allergies   . Hypertension   . Neuropathy   . Seasonal allergic conjunctivitis     Current Outpatient Medications on File Prior to Visit  Medication Sig Dispense Refill  . albuterol (VENTOLIN HFA) 108 (90 Base) MCG/ACT inhaler INHALE 2 PUFFS BY MOUTH EVERY 6 HOURS AS NEEDED FOR WHEEZING 18 g 3  . ALPRAZolam (XANAX) 0.25 MG tablet alprazolam 0.25 mg tablet    . cyclobenzaprine (FLEXERIL) 10 MG tablet Take 1 tablet (10 mg total) by  mouth 2 (two) times daily as needed for muscle spasms. 14 tablet 0  . fluticasone-salmeterol (ADVAIR HFA) 230-21 MCG/ACT inhaler Inhale 2 puffs into the lungs 2 (two) times daily. 3 Inhaler 1  . gabapentin (NEURONTIN) 600 MG tablet Take 600 mg by mouth 3 (three) times daily.    Marland Kitchen lisinopril (PRINIVIL,ZESTRIL) 20 MG tablet Take 1 tablet (20 mg total) by mouth daily. 90 tablet 3  . montelukast (SINGULAIR) 10 MG tablet TAKE 1 TABLET(10 MG) BY MOUTH AT BEDTIME 90 tablet 1  . Multiple Vitamins-Minerals (HM MULTIVITAMIN ADULT GUMMY) CHEW Chew 2 tablets by mouth daily.     . Oxycodone HCl 10 MG TABS Take 1 tablet (10 mg total) by mouth every 4 (four) hours. (Patient taking differently: Take 10 mg by mouth. Take 1 tablet every 6 hours as needed for pain) 180 tablet 0  . sertraline (ZOLOFT) 25 MG tablet TAKE 1 TABLET(25 MG) BY MOUTH DAILY 90 tablet 0  . tiZANidine (ZANAFLEX) 2 MG tablet TK 1 T PO Q 8 H AS NEEDED. NTE 3 DOSES IN 24 H 90 DAY SUPPLY  1  . naproxen (NAPROSYN) 375 MG tablet Take 1 tablet (375 mg total) by mouth 2 (two) times daily. (Patient not taking: Reported on 02/17/2018) 20 tablet 0  . pantoprazole (PROTONIX) 20 MG tablet Take 1 tablet (20 mg total) by mouth daily. (Patient not taking: Reported on 02/17/2018) 30 tablet 0   No current facility-administered medications on file prior to visit.     Allergies  Allergen Reactions  .  Aspirin Swelling  . Hydrocodone Itching    Family History  Problem Relation Age of Onset  . Diabetes Mother        Living  . Sleep apnea Mother   . Diabetes Father 48       Deceased  . Hypertension Father   . Jaundice Father   . Hemophilia Father   . Alcoholism Father   . Cancer Maternal Grandfather        Throat  . Cancer Paternal Uncle        Throat  . Asthma Sister   . Diabetes Sister   . Healthy Son        X1  . Healthy Daughter        X1    Social History   Socioeconomic History  . Marital status: Divorced    Spouse name: Not on file    . Number of children: Not on file  . Years of education: Not on file  . Highest education level: Not on file  Occupational History  . Not on file  Social Needs  . Financial resource strain: Not on file  . Food insecurity:    Worry: Not on file    Inability: Not on file  . Transportation needs:    Medical: Not on file    Non-medical: Not on file  Tobacco Use  . Smoking status: Never Smoker  . Smokeless tobacco: Never Used  Substance and Sexual Activity  . Alcohol use: Yes    Alcohol/week: 0.0 oz    Comment: OCCAS  . Drug use: No  . Sexual activity: Not Currently  Lifestyle  . Physical activity:    Days per week: Not on file    Minutes per session: Not on file  . Stress: Not on file  Relationships  . Social connections:    Talks on phone: Not on file    Gets together: Not on file    Attends religious service: Not on file    Active member of club or organization: Not on file    Attends meetings of clubs or organizations: Not on file    Relationship status: Not on file  Other Topics Concern  . Not on file  Social History Narrative  . Not on file   Review of Systems - See HPI.  All other ROS are negative.  BP 124/82   Pulse 76   Temp 98.5 F (36.9 C) (Oral)   Resp 16   Ht _0  (1.702 m)   Wt 252 lb (114.3 kg)   SpO2 98%   BMI 39.47 kg/m   Physical Exam  Constitutional: He appears well-developed and well-nourished. No distress.  HENT:  Head: Normocephalic and atraumatic.  Eyes: Pupils are equal, round, and reactive to light. Conjunctivae are normal.  Neck: Neck supple.  Cardiovascular: Normal rate, regular rhythm, normal heart sounds and intact distal pulses.  Pulmonary/Chest: Effort normal and breath sounds normal. No stridor. No respiratory distress. He has no wheezes. He has no rales. He exhibits tenderness (+ sternal tenderness).  Abdominal: Soft. Bowel sounds are normal. He exhibits no distension and no mass. There is no tenderness.  Neurological: No  cranial nerve deficit.  Skin: Skin is warm. He is not diaphoretic.  Psychiatric: He has a normal mood and affect.    Recent Results (from the past 2160 hour(s))  Basic metabolic panel     Status: Abnormal   Collection Time: 12/08/17  7:21 PM  Result Value Ref Range  Sodium 138 135 - 145 mmol/L   Potassium 4.1 3.5 - 5.1 mmol/L   Chloride 104 101 - 111 mmol/L   CO2 23 22 - 32 mmol/L   Glucose, Bld 107 (H) 65 - 99 mg/dL   BUN 20 6 - 20 mg/dL   Creatinine, Ser 1.07 0.61 - 1.24 mg/dL   Calcium 9.0 8.9 - 10.3 mg/dL   GFR calc non Af Amer >60 >60 mL/min   GFR calc Af Amer >60 >60 mL/min    Comment: (NOTE) The eGFR has been calculated using the CKD EPI equation. This calculation has not been validated in all clinical situations. eGFR's persistently <60 mL/min signify possible Chronic Kidney Disease.    Anion gap 11 5 - 15    Comment: Performed at [facility] [address], [place], [place] [address]  CBC     Status: Abnormal   Collection Time: 12/08/17  7:21 PM  Result Value Ref Range   WBC 11.0 (H) 4.0 - 10.5 K/uL   RBC 4.81 4.22 - 5.81 MIL/uL   Hemoglobin 16.2 13.0 - 17.0 g/dL   HCT 46.9 39.0 - 52.0 %   MCV 97.5 78.0 - 100.0 fL   MCH 33.7 26.0 - 34.0 pg   MCHC 34.5 30.0 - 36.0 g/dL   RDW 13.0 11.5 - 15.5 %   Platelets 191 150 - 400 K/uL    Comment: Performed at [facility], [place] [address] [place], [place] [address]  I-stat troponin, ED     Status: None   Collection Time: 12/08/17  8:23 PM  Result Value Ref Range   Troponin i, poc 0.00 0.00 - 0.08 ng/mL   Comment 3            Comment: Due to the release kinetics of cTnI, a negative result within the first hours of the onset of symptoms does not rule out myocardial infarction with certainty. If myocardial infarction is still suspected, repeat the test at appropriate intervals.   Basic metabolic panel     Status: Abnormal   Collection Time: 12/10/17  1:29 PM  Result Value Ref Range   Sodium 138 135 -  145 mmol/L   Potassium 3.7 3.5 - 5.1 mmol/L   Chloride 104 101 - 111 mmol/L   CO2 21 (L) 22 - 32 mmol/L   Glucose, Bld 100 (H) 65 - 99 mg/dL   BUN 16 6 - 20 mg/dL   Creatinine, Ser 1.21 0.61 - 1.24 mg/dL   Calcium 9.1 8.9 - 10.3 mg/dL   GFR calc non Af Amer >60 >60 mL/min   GFR calc Af Amer >60 >60 mL/min    Comment: (NOTE) The eGFR has been calculated using the CKD EPI equation. This calculation has not been validated in all clinical situations. eGFR's persistently <60 mL/min signify possible Chronic Kidney Disease.    Anion gap 13 5 - 15    Comment: Performed at [facility] [address] [place], [place] [address]  CBC     Status: Abnormal   Collection Time: 12/10/17  1:29 PM  Result Value Ref Range   WBC 13.5 (H) 4.0 - 10.5 K/uL   RBC 5.07 4.22 - 5.81 MIL/uL   Hemoglobin 17.1 (H) 13.0 - 17.0 g/dL   HCT 49.0 39.0 - 52.0 %   MCV 96.6 78.0 - 100.0 fL   MCH 33.7 26.0 - 34.0 pg   MCHC 34.9 30.0 - 36.0 g/dL   RDW 13.0 11.5 -  15.5 %   Platelets 206 150 - 400 K/uL    Comment: Performed at [facility], [place] [address], [place], [place] [address]  I-stat troponin, ED     Status: None   Collection Time: 12/10/17  1:54 PM  Result Value Ref Range   Troponin i, poc 0.00 0.00 - 0.08 ng/mL   Comment 3            Comment: Due to the release kinetics of cTnI, a negative result within the first hours of the onset of symptoms does not rule out myocardial infarction with certainty. If myocardial infarction is still suspected, repeat the test at appropriate intervals.   Basic metabolic panel     Status: Abnormal   Collection Time: 02/16/18  5:07 PM  Result Value Ref Range   Sodium 137 135 - 145 mmol/L   Potassium 4.1 3.5 - 5.1 mmol/L   Chloride 104 101 - 111 mmol/L   CO2 22 22 - 32 mmol/L   Glucose, Bld 98 65 - 99 mg/dL   BUN 7 6 - 20 mg/dL   Creatinine, Ser 0.96 0.61 - 1.24 mg/dL   Calcium 8.6 (L) 8.9 - 10.3 mg/dL   GFR calc non Af Amer >60 >60 mL/min   GFR calc  Af Amer >60 >60 mL/min    Comment: (NOTE) The eGFR has been calculated using the CKD EPI equation. This calculation has not been validated in all clinical situations. eGFR's persistently <60 mL/min signify possible Chronic Kidney Disease.    Anion gap 11 5 - 15    Comment: Performed at [facility], [address] [place], [place] [address]  CBC     Status: None   Collection Time: 02/16/18  5:07 PM  Result Value Ref Range   WBC 6.3 4.0 - 10.5 K/uL   RBC 4.59 4.22 - 5.81 MIL/uL   Hemoglobin 15.1 13.0 - 17.0 g/dL   HCT 43.9 39.0 - 52.0 %   MCV 95.6 78.0 - 100.0 fL   MCH 32.9 26.0 - 34.0 pg   MCHC 34.4 30.0 - 36.0 g/dL   RDW 13.8 11.5 - 15.5 %   Platelets 152 150 - 400 K/uL    Comment: Performed at [facility], [address] [place], [place] [address]  Troponin I     Status: None   Collection Time: 02/16/18  5:07 PM  Result Value Ref Range   Troponin I <0.03 <0.03 ng/mL    Comment: Performed at [facility], [address] [place], [place] [address]  Lipase, blood     Status: Abnormal   Collection Time: 02/16/18  5:07 PM  Result Value Ref Range   Lipase 94 (H) 11 - 51 U/L    Comment: Performed at [facility], [address] [place], [place] [address]  D-dimer, quantitative     Status: Abnormal   Collection Time: 02/16/18  5:07 PM  Result Value Ref Range   D-Dimer, Quant 0.76 (H) 0.00 - 0.50 ug/mL-FEU    Comment: (NOTE) At the manufacturer cut-off of 0.50 ug/mL FEU, this assay has been documented to exclude PE with a sensitivity and negative predictive value of 97 to 99%.  At this time, this assay has not been approved by the FDA to exclude DVT/VTE. Results should be correlated with clinical presentation. Performed at [facility], [address] [place], [place] [address]   Hepatic function panel  Status: Abnormal   Collection Time: 02/16/18  5:07 PM  Result Value Ref Range   Total Protein 7.5 6.5 - 8.1  g/dL   Albumin 4.0 3.5 - 5.0 g/dL   AST 60 (H) 15 - 41 U/L   ALT 61 17 - 63 U/L   Alkaline Phosphatase 77 38 - 126 U/L   Total Bilirubin 0.6 0.3 - 1.2 mg/dL   Bilirubin, Direct 0.1 0.1 - 0.5 mg/dL   Indirect Bilirubin 0.5 0.3 - 0.9 mg/dL    Comment: Performed at [facility], [address] [place], [place] [address]  Troponin I     Status: None   Collection Time: 02/16/18  7:03 PM  Result Value Ref Range   Troponin I <0.03 <0.03 ng/mL    Comment: Performed at [facility], [address] [place], [place] [address]    Assessment/Plan: 1. Anxiety and depression Repeat TSH today. Anxiety worsened due to health stressors. Declines change in medication presently. Will monitor.  - TSH  2. Cervical disc disorder with radiculopathy of cervical region Followed by Neurosurgery, s/p intervention. On chronic pain medication. Will research alternative therapies and practitioners for him. He has questions about use of medical marijuana. Discussed this is typically reserved form oncology patients.   3. Acute pancreatitis without infection or necrosis, unspecified pancreatitis type Mild elevation of lipase. His presentation in the ER seems more indicative of costochondritis. No nausea/vomiting or abdominal pain presently, only sternal pain. Will repeat lipase today.  - Lipase  4. Costochondritis Naproxen as directed. Supportive measures reviewed. Continue chronic pain medications as directed.,  5. SOBOE (shortness of breath on exertion) Will set up for OP echocardiogram to further assess.    Leeanne Rio, PA-C

## 2018-02-18 ENCOUNTER — Other Ambulatory Visit: Payer: Self-pay

## 2018-02-18 ENCOUNTER — Other Ambulatory Visit: Payer: Self-pay | Admitting: Physician Assistant

## 2018-02-18 MED ORDER — SERTRALINE HCL 50 MG PO TABS: 50.0000 mg | ORAL_TABLET | Freq: Every day | ORAL | 0 refills | Status: DC

## 2018-02-18 MED ORDER — ALPRAZOLAM 0.25 MG PO TABS: 0.2500 mg | ORAL_TABLET | Freq: Two times a day (BID) | ORAL | 1 refills | Status: DC | PRN

## 2018-02-24 ENCOUNTER — Telehealth: Payer: Self-pay | Admitting: Physician Assistant

## 2018-02-24 ENCOUNTER — Encounter: Payer: Self-pay | Admitting: *Deleted

## 2018-02-24 NOTE — Telephone Encounter (Signed)
I have been looking onto acupressure, acupuncture and other holistic practitioners for him. We did discuss that typically medical marijuana is reserved for cancer patients. There is the [facility] at [phone] and [facility] at [phone] that he can call to set up an appointment as they will be able to help him as well as guide him further.

## 2018-02-24 NOTE — Telephone Encounter (Signed)
Copied from [place] [id number]. Topic: Quick Communication - See Telephone Encounter >> Feb 24, 2018 11:05 AM Ivar Drape wrote: CRM for notification. See Telephone encounter for: 02/24/18. Patient is looking for alternative pain meds like THC oil or tablets for pain but he hasn't heard anything yet from the provider.  Please give him a call.

## 2018-02-24 NOTE — Telephone Encounter (Signed)
Detailed message left for patient.  Information sent via mychart as well.    CRM Created. If patient returns call, okay for PEC to discuss

## 2018-03-08 ENCOUNTER — Encounter: Payer: Self-pay | Admitting: Physician Assistant

## 2018-03-09 DIAGNOSIS — M5020 Other cervical disc displacement, unspecified cervical region: Secondary | ICD-10-CM | POA: Diagnosis not present

## 2018-03-10 ENCOUNTER — Telehealth: Payer: Self-pay | Admitting: Emergency Medicine

## 2018-03-10 ENCOUNTER — Ambulatory Visit (HOSPITAL_COMMUNITY): Payer: Medicare HMO | Attending: Cardiology

## 2018-03-10 ENCOUNTER — Other Ambulatory Visit: Payer: Self-pay

## 2018-03-10 DIAGNOSIS — R0602 Shortness of breath: Secondary | ICD-10-CM | POA: Diagnosis not present

## 2018-03-10 DIAGNOSIS — M549 Dorsalgia, unspecified: Secondary | ICD-10-CM | POA: Diagnosis not present

## 2018-03-10 DIAGNOSIS — I10 Essential (primary) hypertension: Secondary | ICD-10-CM | POA: Diagnosis not present

## 2018-03-10 DIAGNOSIS — G8929 Other chronic pain: Secondary | ICD-10-CM | POA: Insufficient documentation

## 2018-03-10 DIAGNOSIS — R1013 Epigastric pain: Secondary | ICD-10-CM | POA: Diagnosis not present

## 2018-03-10 DIAGNOSIS — J45909 Unspecified asthma, uncomplicated: Secondary | ICD-10-CM | POA: Insufficient documentation

## 2018-03-10 NOTE — Telephone Encounter (Signed)
Spoke with patient about his scooter. He states [employer] approved the Scooter but [facility] does not provide Scooters just motorized wheelchair.  Patient was going to contact [employer] to get suppliers of Scooters and will let us know so we can fax a prescription.

## 2018-03-11 ENCOUNTER — Other Ambulatory Visit: Payer: Self-pay | Admitting: Physician Assistant

## 2018-03-11 MED ORDER — LISINOPRIL 20 MG PO TABS: 20.0000 mg | ORAL_TABLET | Freq: Every day | ORAL | 0 refills | Status: DC

## 2018-03-12 DIAGNOSIS — M545 Low back pain: Secondary | ICD-10-CM | POA: Diagnosis not present

## 2018-03-12 DIAGNOSIS — M542 Cervicalgia: Secondary | ICD-10-CM | POA: Diagnosis not present

## 2018-03-16 ENCOUNTER — Other Ambulatory Visit: Payer: Self-pay | Admitting: *Deleted

## 2018-03-16 DIAGNOSIS — R7989 Other specified abnormal findings of blood chemistry: Secondary | ICD-10-CM

## 2018-03-17 ENCOUNTER — Other Ambulatory Visit: Payer: Medicare HMO

## 2018-04-21 ENCOUNTER — Ambulatory Visit: Payer: Medicare HMO

## 2018-04-21 NOTE — Progress Notes (Deleted)
Subjective:   Daniel Durham is a 52 y.o. male who presents for Medicare Annual/Subsequent preventive examination.  Review of Systems:  No ROS.  Medicare Wellness Visit. Additional risk factors are reflected in the social history.    Sleep patterns:  Home Safety/Smoke Alarms: Feels safe in home. Smoke alarms in place.  Living environment; residence and Firearm Safety:  [place] Safety/Bike Helmet: Wears seat belt.   Male:   CCS-     PSA-  Lab Results  Component Value Date   PSA 3.44 01/07/2017       Objective:    Vitals: There were no vitals taken for this visit.  There is no height or weight on file to calculate BMI.  Advanced Directives 12/08/2017 12/06/2017 01/07/2017 10/24/2015 07/21/2015 06/20/2015 01/11/2015  Does Patient Have a Medical Advance Directive? No No No No No No No  Would patient like information on creating a medical advance directive? Yes (ED - Information included in AVS) - - Yes - Educational materials given No - patient declined information No - patient declined information No - patient declined information    Tobacco Social History   Tobacco Use  Smoking Status Never Smoker  Smokeless Tobacco Never Used     Counseling given: Not Answered    Past Medical History:  Diagnosis Date  . Asthma    SEASONAL   . Chronic back pain    Neurostimulator  . Depression   . Environmental allergies   . Hypertension   . Neuropathy   . Seasonal allergic conjunctivitis    Past Surgical History:  Procedure Laterality Date  . BACK SURGERY    . 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 EF 45% in RAO, 60% LAO (51% stress, 54% rest by NM stress; 57-59% echo 10/2010)  . CARPAL TUNNEL RELEASE     Bilateral  . HAND AMPUTATION  2007  . ROTATOR CUFF REPAIR     Left  . SPINAL CORD STIMULATOR IMPLANT    . SPINAL CORD STIMULATOR REMOVAL N/A 10/26/2015   Procedure: LUMBAR SPINAL CORD STIMULATOR REMOVAL;   Surgeon: Clydell Hakim, MD;  Location: [facility];  Service: Neurosurgery;  Laterality: N/A;   Family History  Problem Relation Age of Onset  . Diabetes Mother        Living  . Sleep apnea Mother   . Diabetes Father 29       Deceased  . Hypertension Father   . Jaundice Father   . Hemophilia Father   . Alcoholism Father   . Cancer Maternal Grandfather        Throat  . Cancer Paternal Uncle        Throat  . Asthma Sister   . Diabetes Sister   . Healthy Son        X1  . Healthy Daughter        X1   Social History   Socioeconomic History  . Marital status: Divorced    Spouse name: Not on file  . Number of children: Not on file  . Years of education: Not on file  . Highest education level: Not on file  Occupational History  . Not on file  Social Needs  . Financial resource strain: Not on file  . Food insecurity:    Worry: Not on file    Inability: Not on file  . Transportation needs:    Medical: Not on file    Non-medical:  Not on file  Tobacco Use  . Smoking status: Never Smoker  . Smokeless tobacco: Never Used  Substance and Sexual Activity  . Alcohol use: Yes    Alcohol/week: 0.0 oz    Comment: OCCAS  . Drug use: No  . Sexual activity: Not Currently  Lifestyle  . Physical activity:    Days per week: Not on file    Minutes per session: Not on file  . Stress: Not on file  Relationships  . Social connections:    Talks on phone: Not on file    Gets together: Not on file    Attends religious service: Not on file    Active member of club or organization: Not on file    Attends meetings of clubs or organizations: Not on file    Relationship status: Not on file  Other Topics Concern  . Not on file  Social History Narrative  . Not on file    Outpatient Encounter Medications as of 04/21/2018  Medication Sig  . albuterol (VENTOLIN HFA) 108 (90 Base) MCG/ACT inhaler INHALE 2 PUFFS BY MOUTH EVERY 6 HOURS AS NEEDED FOR WHEEZING  . ALPRAZolam (XANAX) 0.25 MG  tablet Take 1 tablet (0.25 mg total) by mouth 2 (two) times daily as needed for anxiety.  . cyclobenzaprine (FLEXERIL) 10 MG tablet Take 1 tablet (10 mg total) by mouth 2 (two) times daily as needed for muscle spasms.  . fluticasone-salmeterol (ADVAIR HFA) 230-21 MCG/ACT inhaler Inhale 2 puffs into the lungs 2 (two) times daily.  Marland Kitchen gabapentin (NEURONTIN) 600 MG tablet Take 600 mg by mouth 3 (three) times daily.  Marland Kitchen lisinopril (PRINIVIL,ZESTRIL) 20 MG tablet Take 1 tablet (20 mg total) by mouth daily.  . montelukast (SINGULAIR) 10 MG tablet TAKE 1 TABLET(10 MG) BY MOUTH AT BEDTIME  . Multiple Vitamins-Minerals (HM MULTIVITAMIN ADULT GUMMY) CHEW Chew 2 tablets by mouth daily.   . naproxen (NAPROSYN) 375 MG tablet Take 1 tablet (375 mg total) by mouth 2 (two) times daily. (Patient not taking: Reported on 02/17/2018)  . Oxycodone HCl 10 MG TABS Take 1 tablet (10 mg total) by mouth every 4 (four) hours. (Patient taking differently: Take 10 mg by mouth. Take 1 tablet every 6 hours as needed for pain)  . pantoprazole (PROTONIX) 20 MG tablet Take 1 tablet (20 mg total) by mouth daily. (Patient not taking: Reported on 02/17/2018)  . sertraline (ZOLOFT) 50 MG tablet Take 1 tablet (50 mg total) by mouth daily.  Marland Kitchen tiZANidine (ZANAFLEX) 2 MG tablet TK 1 T PO Q 8 H AS NEEDED. NTE 3 DOSES IN 24 H 90 DAY SUPPLY   No facility-administered encounter medications on file as of 04/21/2018.     Activities of Daily Living In your present state of health, do you have any difficulty performing the following activities: 02/17/2018 11/06/2017  Hearing? N N  Vision? N N  Difficulty concentrating or making decisions? N N  Walking or climbing stairs? N N  Dressing or bathing? N N  Doing errands, shopping? N N  Some recent data might be hidden    Patient Care Team: Delorse Limber as PCP - General (Physician Assistant) Clydell Hakim, MD as Consulting Physician (Anesthesiology) Newman Pies, MD as Consulting  Physician (Neurosurgery) Leandrew Koyanagi, MD as Attending Physician (Orthopedic Surgery)   Assessment:   This is a routine wellness examination for [name].  Exercise Activities and Dietary recommendations   Diet (meal preparation, eat out, water intake, caffeinated beverages, dairy products,  fruits and vegetables):   Breakfast: Lunch:  Dinner:      Goals    None      Fall Risk Fall Risk  01/08/2017 06/20/2015  Falls in the past year? Yes Yes  Number falls in past yr: 2 or more 2 or more  Injury with Fall? No Yes  Risk Factor Category  High Fall Risk High Fall Risk  Risk for fall due to : Impaired mobility History of fall(s);Impaired balance/gait;Impaired vision;Impaired mobility;Medication side effect  Follow up Falls evaluation completed;Falls prevention discussed -    Depression Screen PHQ 2/9 Scores 02/17/2018 11/06/2017 01/08/2017 12/24/2016  PHQ - 2 Score 3 0 0 1  PHQ- 9 Score 5 0 - 5    Cognitive Function        Immunization History  Administered Date(s) Administered  . Influenza Inj Mdck Quad With Preservative 09/19/2017  . Influenza Split 09/10/2015  . Influenza,inj,Quad PF,6+ Mos 08/11/2016, 08/06/2017  . Tdap 01/31/2015    Screening Tests Health Maintenance  Topic Date Due  . COLONOSCOPY  04/04/2017  . HIV Screening  12/10/2018 (Originally 04/04/1981)  . INFLUENZA VACCINE  06/03/2018  . DTaP/Tdap/Td (2 - Td) 01/30/2025  . TETANUS/TDAP  01/30/2025        Plan:     I have personally reviewed and noted the following in the patient's chart:   . Medical and social history . Use of alcohol, tobacco or illicit drugs  . Current medications and supplements . Functional ability and status . Nutritional status . Physical activity . Advanced directives . List of other physicians . Hospitalizations, surgeries, and ER visits in previous 12 months . Vitals . Screenings to include cognitive, depression, and falls . Referrals and appointments  In addition, I  have reviewed and discussed with patient certain preventive protocols, quality metrics, and best practice recommendations. A written personalized care plan for preventive services as well as general preventive health recommendations were provided to patient.     Gerilyn Nestle, RN  04/21/2018

## 2018-05-04 ENCOUNTER — Other Ambulatory Visit: Payer: Self-pay | Admitting: Physician Assistant

## 2018-05-04 ENCOUNTER — Encounter: Payer: Self-pay | Admitting: Physician Assistant

## 2018-05-04 MED ORDER — SERTRALINE HCL 50 MG PO TABS: 50.0000 mg | ORAL_TABLET | Freq: Every day | ORAL | 0 refills | Status: DC

## 2018-05-04 MED ORDER — SERTRALINE HCL 50 MG PO TABS: 50.0000 mg | ORAL_TABLET | Freq: Every day | ORAL | 1 refills | Status: DC

## 2018-05-04 MED ORDER — LISINOPRIL 20 MG PO TABS: 20.0000 mg | ORAL_TABLET | Freq: Every day | ORAL | 1 refills | Status: DC

## 2018-05-21 ENCOUNTER — Encounter: Payer: Self-pay | Admitting: Physician Assistant

## 2018-05-21 ENCOUNTER — Ambulatory Visit: Payer: Medicare HMO | Admitting: Physician Assistant

## 2018-05-25 ENCOUNTER — Encounter: Payer: Self-pay | Admitting: Physician Assistant

## 2018-05-25 ENCOUNTER — Encounter: Payer: Self-pay | Admitting: Gastroenterology

## 2018-05-25 ENCOUNTER — Other Ambulatory Visit: Payer: Self-pay

## 2018-05-25 ENCOUNTER — Ambulatory Visit (INDEPENDENT_AMBULATORY_CARE_PROVIDER_SITE_OTHER): Payer: Medicare HMO | Admitting: Physician Assistant

## 2018-05-25 VITALS — BP 136/90 | HR 83 | Temp 98.0°F | Resp 17 | Ht 67.0 in | Wt 244.2 lb

## 2018-05-25 DIAGNOSIS — Z1211 Encounter for screening for malignant neoplasm of colon: Secondary | ICD-10-CM

## 2018-05-25 DIAGNOSIS — F329 Major depressive disorder, single episode, unspecified: Secondary | ICD-10-CM | POA: Diagnosis not present

## 2018-05-25 DIAGNOSIS — F419 Anxiety disorder, unspecified: Secondary | ICD-10-CM | POA: Diagnosis not present

## 2018-05-25 DIAGNOSIS — F32A Depression, unspecified: Secondary | ICD-10-CM

## 2018-05-25 MED ORDER — ALPRAZOLAM 0.25 MG PO TABS: 0.2500 mg | ORAL_TABLET | Freq: Two times a day (BID) | ORAL | 1 refills | Status: DC | PRN

## 2018-05-25 NOTE — Patient Instructions (Addendum)
Please continue current medication regimen as directed. Since you are not able to talk about certain things with your granddaughter here today, please send me a MyChart message.  Medication refills have been sent.  Check with the ladies up front to see when you are do for a physical. Otherwise follow-up with me in 6 months.

## 2018-05-25 NOTE — Assessment & Plan Note (Signed)
Doing well overall. Continue current regimen. Medications refilled. CSC updated today.

## 2018-05-25 NOTE — Progress Notes (Signed)
Patient presents to clinic today for follow-up of anxiety and depression. Patient is currently on a refimen of Sertraline 50 mg daily. Is also taking Xanax 0.25 mg on a rare basis as needed. Notes significantly improved mood on this regimen. Has had some recent stressors causing him to temporarily increase use of his Xanax but this is improved. Denies SI/HI .   Past Medical History:  Diagnosis Date  . Asthma    SEASONAL   . Chronic back pain    Neurostimulator  . Depression   . Environmental allergies   . Hypertension   . Neuropathy   . Seasonal allergic conjunctivitis     Current Outpatient Medications on File Prior to Visit  Medication Sig Dispense Refill  . albuterol (VENTOLIN HFA) 108 (90 Base) MCG/ACT inhaler INHALE 2 PUFFS BY MOUTH EVERY 6 HOURS AS NEEDED FOR WHEEZING 18 g 3  . cyclobenzaprine (FLEXERIL) 10 MG tablet Take 1 tablet (10 mg total) by mouth 2 (two) times daily as needed for muscle spasms. 14 tablet 0  . fluticasone-salmeterol (ADVAIR HFA) 230-21 MCG/ACT inhaler Inhale 2 puffs into the lungs 2 (two) times daily. 3 Inhaler 1  . gabapentin (NEURONTIN) 600 MG tablet Take 600 mg by mouth 3 (three) times daily.    Marland Kitchen lisinopril (PRINIVIL,ZESTRIL) 20 MG tablet Take 1 tablet (20 mg total) by mouth daily. 90 tablet 1  . montelukast (SINGULAIR) 10 MG tablet TAKE 1 TABLET(10 MG) BY MOUTH AT BEDTIME 90 tablet 1  . Multiple Vitamins-Minerals (HM MULTIVITAMIN ADULT GUMMY) CHEW Chew 2 tablets by mouth daily.     . Oxycodone HCl 10 MG TABS Take 1 tablet (10 mg total) by mouth every 4 (four) hours. (Patient taking differently: Take 10 mg by mouth. Take 1 tablet every 6 hours as needed for pain) 180 tablet 0  . pantoprazole (PROTONIX) 20 MG tablet Take 1 tablet (20 mg total) by mouth daily. 30 tablet 0  . sertraline (ZOLOFT) 50 MG tablet Take 1 tablet (50 mg total) by mouth daily. 90 tablet 0  . tiZANidine (ZANAFLEX) 2 MG tablet TK 1 T PO Q 8 H AS NEEDED. NTE 3 DOSES IN 24 H 90 DAY  SUPPLY  1  . naproxen (NAPROSYN) 375 MG tablet Take 1 tablet (375 mg total) by mouth 2 (two) times daily. (Patient not taking: Reported on 02/17/2018) 20 tablet 0   No current facility-administered medications on file prior to visit.     Allergies  Allergen Reactions  . Aspirin Swelling  . Hydrocodone Itching    Family History  Problem Relation Age of Onset  . Diabetes Mother        Living  . Sleep apnea Mother   . Diabetes Father 19       Deceased  . Hypertension Father   . Jaundice Father   . Hemophilia Father   . Alcoholism Father   . Cancer Maternal Grandfather        Throat  . Cancer Paternal Uncle        Throat  . Asthma Sister   . Diabetes Sister   . Healthy Son        X1  . Healthy Daughter        X1    Social History   Socioeconomic History  . Marital status: Divorced    Spouse name: Not on file  . Number of children: Not on file  . Years of education: Not on file  . Highest education level: Not on  file  Occupational History  . Not on file  Social Needs  . Financial resource strain: Not on file  . Food insecurity:    Worry: Not on file    Inability: Not on file  . Transportation needs:    Medical: Not on file    Non-medical: Not on file  Tobacco Use  . Smoking status: Never Smoker  . Smokeless tobacco: Never Used  Substance and Sexual Activity  . Alcohol use: Yes    Alcohol/week: 0.0 oz    Comment: OCCAS  . Drug use: No  . Sexual activity: Not Currently  Lifestyle  . Physical activity:    Days per week: Not on file    Minutes per session: Not on file  . Stress: Not on file  Relationships  . Social connections:    Talks on phone: Not on file    Gets together: Not on file    Attends religious service: Not on file    Active member of club or organization: Not on file    Attends meetings of clubs or organizations: Not on file    Relationship status: Not on file  Other Topics Concern  . Not on file  Social History Narrative  . Not on  file   Review of Systems - See HPI.  All other ROS are negative.  BP 136/90 (BP Location: Right Arm, Cuff Size: Normal)   Pulse 83   Temp 98 F (36.7 C) (Oral)   Resp 17   Ht 5\' 7"  (1.702 m)   Wt 244 lb 3.2 oz (110.8 kg)   SpO2 97%   BMI 38.25 kg/m   Physical Exam  Constitutional: He appears well-developed and well-nourished.  HENT:  Head: Normocephalic and atraumatic.  Eyes: Conjunctivae are normal.  Neck: Neck supple.  Cardiovascular: Normal rate, regular rhythm, normal heart sounds and intact distal pulses.  Pulmonary/Chest: Effort normal.  Psychiatric: He has a normal mood and affect.  Vitals reviewed.  Assessment/Plan: Anxiety and depression Doing well overall. Continue current regimen. Medications refilled. CSC updated today.  Colon cancer screening Average risk. Asymptomatic. Due for routine screening. Referral to GI placed for screening colonoscopy.    Leeanne Rio, PA-C

## 2018-05-25 NOTE — Assessment & Plan Note (Signed)
Average risk. Asymptomatic. Due for routine screening. Referral to GI placed for screening colonoscopy.

## 2018-06-07 ENCOUNTER — Encounter: Payer: Self-pay | Admitting: Physician Assistant

## 2018-06-28 DIAGNOSIS — M545 Low back pain: Secondary | ICD-10-CM | POA: Diagnosis not present

## 2018-06-28 DIAGNOSIS — I10 Essential (primary) hypertension: Secondary | ICD-10-CM | POA: Diagnosis not present

## 2018-06-28 DIAGNOSIS — Z6839 Body mass index (BMI) 39.0-39.9, adult: Secondary | ICD-10-CM | POA: Diagnosis not present

## 2018-07-08 ENCOUNTER — Other Ambulatory Visit: Payer: Self-pay | Admitting: Physician Assistant

## 2018-07-14 ENCOUNTER — Encounter: Payer: Self-pay | Admitting: Gastroenterology

## 2018-07-14 ENCOUNTER — Ambulatory Visit (AMBULATORY_SURGERY_CENTER): Payer: Self-pay | Admitting: *Deleted

## 2018-07-14 VITALS — Ht 67.0 in | Wt 251.0 lb

## 2018-07-14 DIAGNOSIS — Z1211 Encounter for screening for malignant neoplasm of colon: Secondary | ICD-10-CM

## 2018-07-14 MED ORDER — NA SULFATE-K SULFATE-MG SULF 17.5-3.13-1.6 GM/177ML PO SOLN: 1.0000 | Freq: Once | ORAL | 0 refills | Status: AC

## 2018-07-14 NOTE — Progress Notes (Signed)
No egg or soy allergy known to patient  No issues with past sedation with any surgeries  or procedures, no intubation problems  No diet pills per patient No home 02 use per patient  No blood thinners per patient  Pt denies issues with constipation  No A fib or A flutter  EMMI video sent to pt's e mail pt declined   

## 2018-07-21 ENCOUNTER — Telehealth: Payer: Self-pay | Admitting: Gastroenterology

## 2018-07-21 DIAGNOSIS — Z1211 Encounter for screening for malignant neoplasm of colon: Secondary | ICD-10-CM

## 2018-07-21 MED ORDER — PEG 3350-KCL-NA BICARB-NACL 420 G PO SOLR: 4000.0000 mL | Freq: Once | ORAL | 0 refills | Status: AC

## 2018-07-21 MED ORDER — BISACODYL 5 MG PO TBEC: 5.0000 mg | DELAYED_RELEASE_TABLET | Freq: Once | ORAL | 0 refills | Status: AC

## 2018-07-21 NOTE — Telephone Encounter (Signed)
Pt states his suprep is $45 and he cannot afford that - I explained to pt this is a great price as this prep can be > 100-200 dollars - states he is on a fixed income and he cannot pay $45  I offered Golytely as its 0-$10 and pt states this is fine Pt will pick up new instructions by Friday 9-20 Golytely and Dulcolax tablets x 4  sent to [facility]

## 2018-07-28 ENCOUNTER — Ambulatory Visit (AMBULATORY_SURGERY_CENTER): Payer: Medicare HMO | Admitting: Gastroenterology

## 2018-07-28 ENCOUNTER — Encounter: Payer: Self-pay | Admitting: Gastroenterology

## 2018-07-28 VITALS — BP 120/83 | HR 60 | Temp 98.9°F | Resp 12 | Ht 67.0 in | Wt 244.0 lb

## 2018-07-28 DIAGNOSIS — Z1211 Encounter for screening for malignant neoplasm of colon: Secondary | ICD-10-CM | POA: Diagnosis not present

## 2018-07-28 DIAGNOSIS — D123 Benign neoplasm of transverse colon: Secondary | ICD-10-CM | POA: Diagnosis not present

## 2018-07-28 MED ORDER — SODIUM CHLORIDE 0.9 % IV SOLN: 500.0000 mL | Freq: Once | INTRAVENOUS | Status: DC

## 2018-07-28 NOTE — Progress Notes (Signed)
Pt's states no medical or surgical changes since previsit or office visit. 

## 2018-07-28 NOTE — Progress Notes (Signed)
A and O x3. Report to RN. Tolerated MAC anesthesia well.

## 2018-07-28 NOTE — Patient Instructions (Signed)
YOU HAD AN ENDOSCOPIC PROCEDURE TODAY AT [facility]:   Refer to the procedure report that was given to you for any specific questions about what was found during the examination.  If the procedure report does not answer your questions, please call your gastroenterologist to clarify.  If you requested that your care partner not be given the details of your procedure findings, then the procedure report has been included in a sealed envelope for you to review at your convenience later.  YOU SHOULD EXPECT: Some feelings of bloating in the abdomen. Passage of more gas than usual.  Walking can help get rid of the air that was put into your GI tract during the procedure and reduce the bloating. If you had a lower endoscopy (such as a colonoscopy or flexible sigmoidoscopy) you may notice spotting of blood in your stool or on the toilet paper. If you underwent a bowel prep for your procedure, you may not have a normal bowel movement for a few days.  Please Note:  You might notice some irritation and congestion in your nose or some drainage.  This is from the oxygen used during your procedure.  There is no need for concern and it should clear up in a day or so.  SYMPTOMS TO REPORT IMMEDIATELY:   Following lower endoscopy (colonoscopy or flexible sigmoidoscopy):  Excessive amounts of blood in the stool  Significant tenderness or worsening of abdominal pains  Swelling of the abdomen that is new, acute  Fever of 100F or higher  For urgent or emergent issues, a gastroenterologist can be reached at any hour by calling [phone].   DIET:  We do recommend a small meal at first, but then you may proceed to your regular diet.  Drink plenty of fluids but you should avoid alcoholic beverages for 24 hours.  MEDICATIONS: Continue present medications.  Please see handouts given to you by your recovery nurse.  Repeat colonoscopy in 1 year for surveillance with 2-day prep due to poor bowel  preparation.  ACTIVITY:  You should plan to take it easy for the rest of today and you should NOT DRIVE or use heavy machinery until tomorrow (because of the sedation medicines used during the test).    FOLLOW UP: Our staff will call the number listed on your records the next business day following your procedure to check on you and address any questions or concerns that you may have regarding the information given to you following your procedure. If we do not reach you, we will leave a message.  However, if you are feeling well and you are not experiencing any problems, there is no need to return our call.  We will assume that you have returned to your regular daily activities without incident.  If any biopsies were taken you will be contacted by phone or by letter within the next 1-3 weeks.  Please call us at [phone] if you have not heard about the biopsies in 3 weeks.   Thank you for allowing [place] to provide for your healthcare needs today.  SIGNATURES/CONFIDENTIALITY: You and/or your care partner have signed paperwork which will be entered into your electronic medical record.  These signatures attest to the fact that that the information above on your After Visit Summary has been reviewed and is understood.  Full responsibility of the confidentiality of this discharge information lies with you and/or your care-partner.

## 2018-07-28 NOTE — Op Note (Signed)
[facility] Patient Name: Daniel Durham Procedure Date: 07/28/2018 9:14 AM MRN: [id number] Endoscopist: [place]. Loletha Carrow , MD Age: 52 Referring MD:  Date of Birth: 10-Oct-1966 Gender: Male Account #: [id number] Procedure:                Colonoscopy Indications:              Screening for colorectal malignant neoplasm, This                            is the patient's first screening colonoscopy                            (reported diagnostic colonoscopy 10 years prior at                            another institution) Medicines:                Monitored Anesthesia Care Procedure:                Pre-Anesthesia Assessment:                           - Prior to the procedure, a History and Physical                            was performed, and patient medications and                            allergies were reviewed. The patient's tolerance of                            previous anesthesia was also reviewed. The risks                            and benefits of the procedure and the sedation                            options and risks were discussed with the patient.                            All questions were answered, and informed consent                            was obtained. Prior Anticoagulants: The patient has                            taken no previous anticoagulant or antiplatelet                            agents. ASA Grade Assessment: II - A patient with                            mild systemic disease. After reviewing the risks  and benefits, the patient was deemed in                            satisfactory condition to undergo the procedure.                           After obtaining informed consent, the colonoscope                            was passed under direct vision. Throughout the                            procedure, the patient's blood pressure, pulse, and                            oxygen saturations were monitored  continuously. The                            Colonoscope was introduced through the anus and                            advanced to the the cecum, identified by                            appendiceal orifice and ileocecal valve. The                            colonoscopy was performed without difficulty. The                            patient tolerated the procedure well. The quality                            of the bowel preparation was poor. The ileocecal                            valve, appendiceal orifice, and rectum were                            photographed. The quality of the bowel preparation                            was evaluated using the BBPS [facility] Bowel                            Preparation Scale) with scores of: Right Colon = 1,                            Transverse Colon = 1 and Left Colon = 1. The total                            BBPS score equals 3. The bowel preparation used was  SUPREP. Scope In: 9:22:26 AM Scope Out: 9:36:38 AM Scope Withdrawal Time: 0 hours 11 minutes 25 seconds  Total Procedure Duration: 0 hours 14 minutes 12 seconds  Findings:                 The perianal and digital rectal examinations were                            normal.                           A large amount of semi-liquid stool was found in                            the entire colon, interfering with visualization.                            Lavage of the area was performed using a large                            amount, resulting in incomplete clearance with                            continued poor visualization.                           A 3 mm polyp was found in the transverse colon. The                            polyp was sessile. The polyp was removed with a                            cold snare. Resection and retrieval were complete.                           Retroflexion in the rectum was not performed due to                             anatomy.                           The exam was otherwise without abnormality. Complications:            No immediate complications. Estimated Blood Loss:     Estimated blood loss was minimal. Impression:               - Preparation of the colon was poor.                           - Stool in the entire examined colon.                           - One 3 mm polyp in the transverse colon, removed                            with a cold snare.  Resected and retrieved.                           - The examination was otherwise normal. Recommendation:           - Patient has a contact number available for                            emergencies. The signs and symptoms of potential                            delayed complications were discussed with the                            patient. Return to normal activities tomorrow.                            Written discharge instructions were provided to the                            patient.                           - Resume previous diet.                           - Continue present medications.                           - Await pathology results.                           - Repeat colonoscopy in 1 year for surveillance                            with 2-day prep due to poor bowel preparation. Zacharia Sowles L. Loletha Carrow, MD 07/28/2018 9:48:47 AM This report has been signed electronically.

## 2018-07-28 NOTE — Progress Notes (Signed)
Called to room to assist during endoscopic procedure.  Patient ID and intended procedure confirmed with present staff. Received instructions for my participation in the procedure from the performing physician.  

## 2018-07-29 ENCOUNTER — Telehealth: Payer: Self-pay

## 2018-07-29 ENCOUNTER — Telehealth: Payer: Self-pay | Admitting: *Deleted

## 2018-07-29 NOTE — Telephone Encounter (Signed)
No answer for post procedure call back. Left message and will attempt to call back later this afternoon. SM 

## 2018-07-29 NOTE — Telephone Encounter (Signed)
2nd attempt, left message for follow up from procedure yesterday.

## 2018-08-03 ENCOUNTER — Encounter: Payer: Self-pay | Admitting: Gastroenterology

## 2018-08-05 ENCOUNTER — Encounter: Payer: Self-pay | Admitting: Physician Assistant

## 2018-08-05 MED ORDER — ALPRAZOLAM 0.25 MG PO TABS: 0.2500 mg | ORAL_TABLET | Freq: Two times a day (BID) | ORAL | 1 refills | Status: DC | PRN

## 2018-08-05 NOTE — Telephone Encounter (Signed)
Xanax last rx 05/25/18 #30 1RF CSC: 05/25/18 LOV: 05/25/18  Please advise

## 2018-08-15 DIAGNOSIS — R079 Chest pain, unspecified: Secondary | ICD-10-CM | POA: Diagnosis not present

## 2018-08-15 DIAGNOSIS — R0789 Other chest pain: Secondary | ICD-10-CM | POA: Diagnosis not present

## 2018-08-15 DIAGNOSIS — R091 Pleurisy: Secondary | ICD-10-CM | POA: Diagnosis not present

## 2018-08-16 DIAGNOSIS — R079 Chest pain, unspecified: Secondary | ICD-10-CM | POA: Diagnosis not present

## 2018-09-09 ENCOUNTER — Other Ambulatory Visit: Payer: Self-pay

## 2018-09-09 ENCOUNTER — Ambulatory Visit (INDEPENDENT_AMBULATORY_CARE_PROVIDER_SITE_OTHER): Payer: Medicare HMO | Admitting: Physician Assistant

## 2018-09-09 ENCOUNTER — Encounter: Payer: Self-pay | Admitting: Physician Assistant

## 2018-09-09 VITALS — BP 132/90 | HR 88 | Temp 98.3°F | Resp 16 | Ht 67.0 in | Wt 250.0 lb

## 2018-09-09 DIAGNOSIS — B86 Scabies: Secondary | ICD-10-CM

## 2018-09-09 MED ORDER — PERMETHRIN 5 % EX CREA: 1.0000 "application " | TOPICAL_CREAM | Freq: Once | CUTANEOUS | 0 refills | Status: AC

## 2018-09-09 MED ORDER — HYDROXYZINE HCL 25 MG PO TABS: 25.0000 mg | ORAL_TABLET | Freq: Three times a day (TID) | ORAL | 0 refills | Status: DC | PRN

## 2018-09-09 NOTE — Patient Instructions (Signed)
Please use the hydroxyzine as directed for itch. You can also apply Witch Hazel or Sarna Lotion to help with itch.  Please apply the Permethrin cream as directed. Wash off 8 hours later. May repeat in 1 week if needed.  Wash all linens in hot water. Dry well and put in an airtight container for 1 week. Rewash again before putting back on the bed.   Have someone come out to the house to assess and take care of infestation.

## 2018-09-09 NOTE — Progress Notes (Signed)
Patient presents to clinic today c/o pruritic rash over his body over the past week. Has noted small bumps over body, including between fingers and toes, axillary region and groin. Denies noting any bugs on him or on mattress at home. Denies fever, chills, malaise. They have 3 dogs in the house but they are kept up on flea/tick prevention, etc. Notes his girlfriends daughter recently moved in with them and she has the same rash when she moved in. Wife now has similar symptoms.    Past Medical History:  Diagnosis Date  . Allergy   . Anxiety   . Asthma    SEASONAL   . Blood transfusion without reported diagnosis   . Chronic back pain    Neurostimulator  . Depression   . Environmental allergies   . Hypertension   . Neuromuscular disorder ([place])   . Neuropathy   . Seasonal allergic conjunctivitis     Current Outpatient Medications on File Prior to Visit  Medication Sig Dispense Refill  . albuterol (VENTOLIN HFA) 108 (90 Base) MCG/ACT inhaler INHALE 2 PUFFS BY MOUTH EVERY 6 HOURS AS NEEDED FOR WHEEZING 18 g 3  . ALPRAZolam (XANAX) 0.25 MG tablet Take 1 tablet (0.25 mg total) by mouth 2 (two) times daily as needed for anxiety. 30 tablet 1  . cyclobenzaprine (FLEXERIL) 10 MG tablet Take 1 tablet (10 mg total) by mouth 2 (two) times daily as needed for muscle spasms. 14 tablet 0  . fluticasone-salmeterol (ADVAIR HFA) 230-21 MCG/ACT inhaler Inhale 2 puffs into the lungs 2 (two) times daily. 3 Inhaler 1  . gabapentin (NEURONTIN) 600 MG tablet Take 600 mg by mouth 3 (three) times daily.    Marland Kitchen lisinopril (PRINIVIL,ZESTRIL) 20 MG tablet Take 1 tablet (20 mg total) by mouth daily. 90 tablet 1  . montelukast (SINGULAIR) 10 MG tablet TAKE 1 TABLET(10 MG) BY MOUTH AT BEDTIME 90 tablet 0  . Multiple Vitamins-Minerals (HM MULTIVITAMIN ADULT GUMMY) CHEW Chew 2 tablets by mouth daily.     . naproxen (NAPROSYN) 375 MG tablet Take 1 tablet (375 mg total) by mouth 2 (two) times daily. 20 tablet 0  .  oxyCODONE-acetaminophen (PERCOCET) 10-325 MG tablet TK 1 T PO Q 6 H PRN  0  . pantoprazole (PROTONIX) 20 MG tablet Take 1 tablet (20 mg total) by mouth daily. 30 tablet 0  . sertraline (ZOLOFT) 50 MG tablet Take 1 tablet (50 mg total) by mouth daily. 90 tablet 0  . tiZANidine (ZANAFLEX) 2 MG tablet TK 1 T PO Q 8 H AS NEEDED. NTE 3 DOSES IN 24 H 90 DAY SUPPLY  1   No current facility-administered medications on file prior to visit.     Allergies  Allergen Reactions  . Aspirin Swelling  . Hydrocodone Itching    Family History  Problem Relation Age of Onset  . Diabetes Mother        Living  . Sleep apnea Mother   . Diabetes Father 50       Deceased  . Hypertension Father   . Jaundice Father   . Hemophilia Father   . Alcoholism Father   . Cancer Maternal Grandfather        Throat  . Esophageal cancer Maternal Grandfather   . Cancer Paternal Uncle        Throat  . Esophageal cancer Paternal Uncle   . Asthma Sister   . Diabetes Sister   . Healthy Son        X1  .  Healthy Daughter        X1  . Colon polyps Neg Hx   . Colon cancer Neg Hx   . Rectal cancer Neg Hx   . Stomach cancer Neg Hx    Social History   Socioeconomic History  . Marital status: Divorced    Spouse name: Not on file  . Number of children: Not on file  . Years of education: Not on file  . Highest education level: Not on file  Occupational History  . Not on file  Social Needs  . Financial resource strain: Not on file  . Food insecurity:    Worry: Not on file    Inability: Not on file  . Transportation needs:    Medical: Not on file    Non-medical: Not on file  Tobacco Use  . Smoking status: Never Smoker  . Smokeless tobacco: Never Used  Substance and Sexual Activity  . Alcohol use: Yes    Alcohol/week: 0.0 standard drinks    Comment: OCCAS  . Drug use: No  . Sexual activity: Not Currently  Lifestyle  . Physical activity:    Days per week: Not on file    Minutes per session: Not on file    . Stress: Not on file  Relationships  . Social connections:    Talks on phone: Not on file    Gets together: Not on file    Attends religious service: Not on file    Active member of club or organization: Not on file    Attends meetings of clubs or organizations: Not on file    Relationship status: Not on file  Other Topics Concern  . Not on file  Social History Narrative  . Not on file   Review of Systems - See HPI.  All other ROS are negative.  BP 132/90   Pulse 88   Temp 98.3 F (36.8 C) (Oral)   Resp 16   Ht 5\' 7"  (1.702 m)   Wt 250 lb (113.4 kg)   SpO2 98%   BMI 39.16 kg/m   Physical Exam  Constitutional: He appears well-developed and well-nourished.  HENT:  Head: Normocephalic and atraumatic.  Neck: Neck supple.  Cardiovascular: Normal rate, regular rhythm, normal heart sounds and intact distal pulses.  Pulmonary/Chest: Effort normal and breath sounds normal. No stridor. No respiratory distress. He has no wheezes. He has no rales. He exhibits no tenderness.  Lymphadenopathy:    He has no cervical adenopathy.  Skin:  Small erythematous lesions noted of torso, extremities but more significant in axillary and inguinal regions. Burrowing noted. Areas of excoriation noted along body without signs of a secondary bacterial infection.  Vitals reviewed.   Assessment/Plan: 1. Scabies infestation Start Elimite x 1 dose. Repeat 7-10 days. Start Atarax for pruiritus. OTC medications reviewed to help with itch. Discussed proper cleaning of linens. Recommend he have someone come to the home to assess infestation as there are multiple people with the same symptoms currently.  - hydrOXYzine (ATARAX/VISTARIL) 25 MG tablet; Take 1 tablet (25 mg total) by mouth 3 (three) times daily as needed.  Dispense: 30 tablet; Refill: 0 - permethrin (ELIMITE) 5 % cream; Apply 1 application topically once for 1 dose.  Dispense: 60 g; Refill: 0   Leeanne Rio, PA-C

## 2018-09-23 ENCOUNTER — Other Ambulatory Visit: Payer: Self-pay | Admitting: Physician Assistant

## 2018-09-23 ENCOUNTER — Telehealth: Payer: Self-pay | Admitting: Physician Assistant

## 2018-09-23 DIAGNOSIS — B86 Scabies: Secondary | ICD-10-CM

## 2018-09-23 MED ORDER — SERTRALINE HCL 50 MG PO TABS: 50.0000 mg | ORAL_TABLET | Freq: Every day | ORAL | 0 refills | Status: DC

## 2018-09-23 MED ORDER — HYDROXYZINE HCL 25 MG PO TABS: 25.0000 mg | ORAL_TABLET | Freq: Three times a day (TID) | ORAL | 0 refills | Status: DC | PRN

## 2018-09-23 MED ORDER — MONTELUKAST SODIUM 10 MG PO TABS: ORAL_TABLET | ORAL | 1 refills | Status: DC

## 2018-09-26 ENCOUNTER — Telehealth: Payer: Self-pay | Admitting: Physician Assistant

## 2018-09-27 NOTE — Telephone Encounter (Signed)
Xanax last filled 08/05/18 #30 1 RF CSC 05/25/18  Last OV 09/09/18 Acute

## 2018-09-28 ENCOUNTER — Ambulatory Visit (INDEPENDENT_AMBULATORY_CARE_PROVIDER_SITE_OTHER): Payer: Medicare HMO | Admitting: Physician Assistant

## 2018-09-28 ENCOUNTER — Telehealth: Payer: Self-pay | Admitting: Physician Assistant

## 2018-09-28 ENCOUNTER — Encounter: Payer: Self-pay | Admitting: Physician Assistant

## 2018-09-28 ENCOUNTER — Other Ambulatory Visit: Payer: Self-pay

## 2018-09-28 ENCOUNTER — Encounter: Payer: Self-pay | Admitting: Emergency Medicine

## 2018-09-28 VITALS — BP 140/98 | HR 71 | Temp 98.0°F | Resp 16 | Ht 67.0 in | Wt 254.0 lb

## 2018-09-28 DIAGNOSIS — M5010 Cervical disc disorder with radiculopathy, unspecified cervical region: Secondary | ICD-10-CM | POA: Diagnosis not present

## 2018-09-28 DIAGNOSIS — M47816 Spondylosis without myelopathy or radiculopathy, lumbar region: Secondary | ICD-10-CM

## 2018-09-28 DIAGNOSIS — M549 Dorsalgia, unspecified: Secondary | ICD-10-CM

## 2018-09-28 DIAGNOSIS — G8929 Other chronic pain: Secondary | ICD-10-CM | POA: Diagnosis not present

## 2018-09-28 MED ORDER — ALPRAZOLAM 0.25 MG PO TABS: 0.2500 mg | ORAL_TABLET | Freq: Two times a day (BID) | ORAL | 1 refills | Status: DC | PRN

## 2018-09-28 MED ORDER — OXYCODONE-ACETAMINOPHEN 10-325 MG PO TABS: ORAL_TABLET | ORAL | 0 refills | Status: DC

## 2018-09-28 NOTE — Telephone Encounter (Signed)
Previous phone note created.  I will document under the other phone note.

## 2018-09-28 NOTE — Telephone Encounter (Signed)
Spoke with Jenny Reichmann at the Pharmacy.   The medication needing clarification is the Hydrocodone.    Per PCP, I have given the okay for this medication.  Pharmacy to fill for patient.

## 2018-09-28 NOTE — Patient Instructions (Signed)
Have a Happy Thanksgiving!  Follow-up with me in 3 months for a complete physical and to follow-up for chronic pain.

## 2018-09-28 NOTE — Telephone Encounter (Signed)
John from [facility].  States it is actually too early to refill Hydroxyzine for pt.  He states that if it is okay to release this medication they need that on the prescription.

## 2018-09-28 NOTE — Progress Notes (Signed)
Reviewed and updated  today  Indication for chronic opioid: Multiple -- Cervical disc disease, Lumbar Spondylosis, Phantom Limb pain (L hand)  Medication and dose: Percocet 10-325 mg. # pills per month: 120 mg Last UDS date: Due today as we are taking over medication Pain contract signed (date): 09/28/18 Date narcotic database last reviewed (include red flags): 09/28/18. No red flags.  Pain Inventory (1-10 worse): Average Pain 8-9 without medication Pain Right Now 6/10 My pain is constant aching and stabbing (character i.e. sharp, stabbing, dull, constant etc)  Pain is worse with: movement and prolonged sitting Relief from Meds: significant  In the last 24 hours, has pain interfered with the following (1-10 greatest interference) ? General activity 8 Relation with others 3 Enjoyment of life 3 What TIME of day is your pain at its worst? nihttime       Sleep (in general) decent  Mobility/Function: Assistance device: None Ability to climb steps?  present but painful Do you drive? yes Disabled (date): > 1 year.  Neuro/Psych Sx: (bladder, bowel, weakness, dizziness, depression etc) Depression Prior Studies: See EMR Physicians involved in your care: Any changes since last visit?  No   Past Medical History:  Diagnosis Date  . Allergy   . Anxiety   . Asthma    SEASONAL   . Blood transfusion without reported diagnosis   . Chronic back pain    Neurostimulator  . Depression   . Environmental allergies   . Hypertension   . Neuromuscular disorder ([place])   . Neuropathy   . Seasonal allergic conjunctivitis     Current Outpatient Medications on File Prior to Visit  Medication Sig Dispense Refill  . albuterol (VENTOLIN HFA) 108 (90 Base) MCG/ACT inhaler INHALE 2 PUFFS BY MOUTH EVERY 6 HOURS AS NEEDED FOR WHEEZING 18 g 3  . ALPRAZolam (XANAX) 0.25 MG tablet Take 1 tablet (0.25 mg total) by mouth 2 (two) times daily as needed for anxiety. 30 tablet 1  . cyclobenzaprine  (FLEXERIL) 10 MG tablet Take 1 tablet (10 mg total) by mouth 2 (two) times daily as needed for muscle spasms. 14 tablet 0  . fluticasone-salmeterol (ADVAIR HFA) 230-21 MCG/ACT inhaler Inhale 2 puffs into the lungs 2 (two) times daily. 3 Inhaler 1  . gabapentin (NEURONTIN) 600 MG tablet Take 600 mg by mouth 3 (three) times daily.    . hydrOXYzine (ATARAX/VISTARIL) 25 MG tablet Take 1 tablet (25 mg total) by mouth 3 (three) times daily as needed. 30 tablet 0  . lisinopril (PRINIVIL,ZESTRIL) 20 MG tablet Take 1 tablet (20 mg total) by mouth daily. 90 tablet 1  . montelukast (SINGULAIR) 10 MG tablet TAKE 1 TABLET(10 MG) BY MOUTH AT BEDTIME 90 tablet 1  . Multiple Vitamins-Minerals (HM MULTIVITAMIN ADULT GUMMY) CHEW Chew 2 tablets by mouth daily.     . naproxen (NAPROSYN) 375 MG tablet Take 1 tablet (375 mg total) by mouth 2 (two) times daily. 20 tablet 0  . oxyCODONE-acetaminophen (PERCOCET) 10-325 MG tablet TK 1 T PO Q 6 H PRN  0  . pantoprazole (PROTONIX) 20 MG tablet Take 1 tablet (20 mg total) by mouth daily. 30 tablet 0  . sertraline (ZOLOFT) 50 MG tablet Take 1 tablet (50 mg total) by mouth daily. 90 tablet 0  . tiZANidine (ZANAFLEX) 2 MG tablet TK 1 T PO Q 8 H AS NEEDED. NTE 3 DOSES IN 24 H 90 DAY SUPPLY  1   No current facility-administered medications on file prior to visit.  Allergies  Allergen Reactions  . Aspirin Swelling  . Hydrocodone Itching    Family History  Problem Relation Age of Onset  . Diabetes Mother        Living  . Sleep apnea Mother   . Diabetes Father 51       Deceased  . Hypertension Father   . Jaundice Father   . Hemophilia Father   . Alcoholism Father   . Cancer Maternal Grandfather        Throat  . Esophageal cancer Maternal Grandfather   . Cancer Paternal Uncle        Throat  . Esophageal cancer Paternal Uncle   . Asthma Sister   . Diabetes Sister   . Healthy Son        X1  . Healthy Daughter        X1  . Colon polyps Neg Hx   . Colon  cancer Neg Hx   . Rectal cancer Neg Hx   . Stomach cancer Neg Hx     Social History   Socioeconomic History  . Marital status: Divorced    Spouse name: Not on file  . Number of children: Not on file  . Years of education: Not on file  . Highest education level: Not on file  Occupational History  . Not on file  Social Needs  . Financial resource strain: Not on file  . Food insecurity:    Worry: Not on file    Inability: Not on file  . Transportation needs:    Medical: Not on file    Non-medical: Not on file  Tobacco Use  . Smoking status: Never Smoker  . Smokeless tobacco: Never Used  Substance and Sexual Activity  . Alcohol use: Yes    Alcohol/week: 0.0 standard drinks    Comment: OCCAS  . Drug use: No  . Sexual activity: Not Currently  Lifestyle  . Physical activity:    Days per week: Not on file    Minutes per session: Not on file  . Stress: Not on file  Relationships  . Social connections:    Talks on phone: Not on file    Gets together: Not on file    Attends religious service: Not on file    Active member of club or organization: Not on file    Attends meetings of clubs or organizations: Not on file    Relationship status: Not on file  Other Topics Concern  . Not on file  Social History Narrative  . Not on file   Review of Systems - See HPI.  All other ROS are negative.  BP (!) 140/98   Pulse 71   Temp 98 F (36.7 C) (Oral)   Resp 16   Ht 5\' 7"  (1.702 m)   Wt 254 lb (115.2 kg)   SpO2 98%   BMI 39.78 kg/m   Physical Exam  Constitutional: He is oriented to person, place, and time. He appears well-developed and well-nourished.  HENT:  Head: Normocephalic and atraumatic.  Eyes: Conjunctivae are normal.  Neck: Neck supple.  Cardiovascular: Normal rate, regular rhythm, normal heart sounds and intact distal pulses.  Pulmonary/Chest: Effort normal and breath sounds normal.  Neurological: He is alert and oriented to person, place, and time.    Psychiatric: He has a normal mood and affect.  Vitals reviewed.  Assessment/Plan: 1. Chronic back pain greater than 3 months duration 2. Cervical disc disorder with radiculopathy of cervical region 3. Spondylosis of lumbar  region without myelopathy or radiculopathy CSC updated. CS Database reviewed. No red flags. UDS today. Follow-up 3 months.    Leeanne Rio, PA-C

## 2018-09-28 NOTE — Telephone Encounter (Signed)
Copied from [place] [id number]. Topic: General - Other >> Sep 28, 2018 10:26 AM Lennox Solders wrote: Reason for CRM: pt is calling and needs early  5 days refill on oxycodone . Pt is going out of town today. Pt saw cody today.  Please  call [place] [address]

## 2018-09-28 NOTE — Telephone Encounter (Signed)
Ok to fill medications.  Ok to fill Hydroxyzine early as he is going out of town -- please clarify though if it is truly the hydroxyzine or his hydrocodone that needs to state this.

## 2018-09-28 NOTE — Addendum Note (Signed)
Addended by: Leonidas Romberg on: 09/28/2018 09:24 AM   Modules accepted: Orders

## 2018-09-28 NOTE — Telephone Encounter (Signed)
John from [facility].  States that the got a script this morning for this medication, but show it is too early to fill.  If this can be released to pt that information needs to be on prescription.

## 2018-09-30 LAB — PAIN MGMT, PROFILE 8 W/CONF, U
6 Acetylmorphine: NEGATIVE ng/mL (ref <10)
Alcohol Metabolites: POSITIVE ng/mL — AB (ref <500)
Amphetamines: NEGATIVE ng/mL (ref <500)
Benzodiazepines: NEGATIVE ng/mL (ref <100)
Buprenorphine, Urine: NEGATIVE ng/mL (ref <5)
COCAINE METABOLITE: NEGATIVE ng/mL (ref <150)
CREATININE: 89 mg/dL
Ethyl Glucuronide (ETG): 4013 ng/mL — ABNORMAL HIGH (ref <500)
Ethyl Sulfate (ETS): 1284 ng/mL — ABNORMAL HIGH (ref <100)
MARIJUANA METABOLITE: NEGATIVE ng/mL (ref <20)
MDMA: NEGATIVE ng/mL (ref <500)
Noroxycodone: 88 ng/mL — ABNORMAL HIGH (ref <50)
OXYCODONE: POSITIVE ng/mL — AB (ref <100)
OXYMORPHONE: 66 ng/mL — AB (ref <50)
Opiates: NEGATIVE ng/mL (ref <100)
Oxidant: NEGATIVE ug/mL (ref <200)
Oxycodone: NEGATIVE ng/mL (ref <50)
pH: 4.99 (ref 4.5–9.0)

## 2018-10-21 ENCOUNTER — Encounter: Payer: Self-pay | Admitting: Physician Assistant

## 2018-10-23 ENCOUNTER — Encounter: Payer: Self-pay | Admitting: Physician Assistant

## 2018-10-25 ENCOUNTER — Other Ambulatory Visit: Payer: Self-pay | Admitting: Physician Assistant

## 2018-10-25 MED ORDER — OXYCODONE-ACETAMINOPHEN 10-325 MG PO TABS: ORAL_TABLET | ORAL | 0 refills | Status: DC

## 2018-11-10 ENCOUNTER — Other Ambulatory Visit: Payer: Self-pay | Admitting: Physician Assistant

## 2018-11-10 ENCOUNTER — Encounter: Payer: Self-pay | Admitting: Physician Assistant

## 2018-11-10 MED ORDER — ALPRAZOLAM 0.25 MG PO TABS: 0.2500 mg | ORAL_TABLET | Freq: Two times a day (BID) | ORAL | 0 refills | Status: DC | PRN

## 2018-11-10 NOTE — Telephone Encounter (Signed)
Called patient, he stated that the pharmacy has the original prescription for #30 no refills for Xanax.

## 2018-11-10 NOTE — Telephone Encounter (Signed)
Last refill:09/28/18 #30, 1 Last OV: 09/28/18

## 2018-11-10 NOTE — Telephone Encounter (Signed)
Xanax last filled 09/28/18 #30 1 RF CSC: 05/27/18 UDS: 09/28/18 Last OV: 09/28/18 To follow up in 3 months

## 2018-11-23 ENCOUNTER — Encounter: Payer: Self-pay | Admitting: Physician Assistant

## 2018-11-23 ENCOUNTER — Other Ambulatory Visit: Payer: Self-pay | Admitting: Physician Assistant

## 2018-11-23 NOTE — Telephone Encounter (Signed)
Indication for chronic opioid: Spondylosis of lumbar Medication and dose: Percocet 10/325 # pills per month: 120  Last UDS date: 09/28/18 Opioid Treatment Agreement signed (Y/N): Yes Opioid Treatment Agreement last reviewed with patient:   [facility] reviewed this encounter (include red flags):     Xanax last filled on 11/10/18 #30   Last OV: 09/28/18

## 2018-11-24 MED ORDER — OXYCODONE-ACETAMINOPHEN 10-325 MG PO TABS: ORAL_TABLET | ORAL | 0 refills | Status: DC

## 2018-11-24 MED ORDER — SERTRALINE HCL 50 MG PO TABS: 50.0000 mg | ORAL_TABLET | Freq: Every day | ORAL | 0 refills | Status: DC

## 2018-11-24 MED ORDER — ALPRAZOLAM 0.25 MG PO TABS: 0.2500 mg | ORAL_TABLET | Freq: Two times a day (BID) | ORAL | 0 refills | Status: DC | PRN

## 2018-12-24 ENCOUNTER — Other Ambulatory Visit: Payer: Self-pay

## 2018-12-24 ENCOUNTER — Other Ambulatory Visit: Payer: Self-pay | Admitting: Physician Assistant

## 2018-12-24 ENCOUNTER — Encounter: Payer: Self-pay | Admitting: Physician Assistant

## 2018-12-24 ENCOUNTER — Ambulatory Visit: Payer: Medicare HMO | Admitting: Physician Assistant

## 2018-12-24 ENCOUNTER — Ambulatory Visit (INDEPENDENT_AMBULATORY_CARE_PROVIDER_SITE_OTHER): Payer: Medicare HMO | Admitting: Physician Assistant

## 2018-12-24 VITALS — BP 138/98 | HR 93 | Temp 98.7°F | Resp 16 | Ht 67.0 in | Wt 254.0 lb

## 2018-12-24 DIAGNOSIS — M549 Dorsalgia, unspecified: Secondary | ICD-10-CM | POA: Diagnosis not present

## 2018-12-24 DIAGNOSIS — G8929 Other chronic pain: Secondary | ICD-10-CM | POA: Diagnosis not present

## 2018-12-24 DIAGNOSIS — L821 Other seborrheic keratosis: Secondary | ICD-10-CM

## 2018-12-24 LAB — COMPREHENSIVE METABOLIC PANEL
ALT: 66 U/L — ABNORMAL HIGH (ref 0–53)
AST: 43 U/L — ABNORMAL HIGH (ref 0–37)
Albumin: 4.7 g/dL (ref 3.5–5.2)
Alkaline Phosphatase: 83 U/L (ref 39–117)
BUN: 15 mg/dL (ref 6–23)
CO2: 27 mEq/L (ref 19–32)
Calcium: 9.3 mg/dL (ref 8.4–10.5)
Chloride: 100 mEq/L (ref 96–112)
Creatinine, Ser: 1.14 mg/dL (ref 0.40–1.50)
GFR: 67.53 mL/min (ref >60.00)
GLUCOSE: 92 mg/dL (ref 70–99)
Potassium: 4.3 mEq/L (ref 3.5–5.1)
Sodium: 137 mEq/L (ref 135–145)
Total Bilirubin: 0.8 mg/dL (ref 0.2–1.2)
Total Protein: 7.6 g/dL (ref 6.0–8.3)

## 2018-12-24 MED ORDER — ALBUTEROL SULFATE (2.5 MG/3ML) 0.083% IN NEBU: 2.5000 mg | INHALATION_SOLUTION | Freq: Four times a day (QID) | RESPIRATORY_TRACT | 1 refills | Status: DC | PRN

## 2018-12-24 MED ORDER — ALPRAZOLAM 0.25 MG PO TABS: 0.2500 mg | ORAL_TABLET | Freq: Two times a day (BID) | ORAL | 0 refills | Status: DC | PRN

## 2018-12-24 MED ORDER — SERTRALINE HCL 50 MG PO TABS: 50.0000 mg | ORAL_TABLET | Freq: Every day | ORAL | 0 refills | Status: DC

## 2018-12-24 MED ORDER — OXYCODONE-ACETAMINOPHEN 10-325 MG PO TABS: ORAL_TABLET | ORAL | 0 refills | Status: DC

## 2018-12-24 NOTE — Telephone Encounter (Signed)
Last OV 09/28/18 Alprazolam last filled 11/24/18 #30 with 0 Oxycodone last filled 11/24/18 #120 with 0 Sertraline last filled 11/24/18 #90 with 0

## 2018-12-24 NOTE — Progress Notes (Addendum)
Reviewed and updated Today.  Indication for chronic opioid: Chronic back pain, spinal stenosis. Chronic OA. Medication and dose: Hydrocodone 10-325 # pills per month: 120 Last UDS date: 09/2018.  Pain contract signed (date): 09/2018. Up-to-date.  Date narcotic database last reviewed (include red flags): 12/24/18 - No red flags  Pain Inventory (1-10 worse): Average Pain 5-6/10 before medication. Pain Right Now 5/10 My pain is aching and dull (character i.e. sharp, stabbing, dull, constant etc)  Pain is worse with: activity Relief from Meds: great relief currently.   In the last 24 hours, has pain interfered with the following (1-10 greatest interference) ? General activity 5 Relation with others 1 Enjoyment of life 1 What TIME of day is your pain at its worst? evening       Sleep (in general) fair  Mobility/Function: Assistance device: cane How many minutes can you walk? 15-20 Ability to climb steps?  present but difficult Do you drive? yes Disabled (date): yes Neuro/Psych Sx: (bladder, bowel, weakness, dizziness, depression etc) None Prior Studies: See EMR Physicians involved in your care: Any changes since last visit?  None  Past Medical History:  Diagnosis Date  . Allergy   . Anxiety   . Asthma    SEASONAL   . Blood transfusion without reported diagnosis   . Chronic back pain    Neurostimulator  . Depression   . Environmental allergies   . Hypertension   . Neuromuscular disorder ([place])   . Neuropathy   . Seasonal allergic conjunctivitis     Current Outpatient Medications on File Prior to Visit  Medication Sig Dispense Refill  . albuterol (VENTOLIN HFA) 108 (90 Base) MCG/ACT inhaler INHALE 2 PUFFS BY MOUTH EVERY 6 HOURS AS NEEDED FOR WHEEZING 18 g 3  . cyclobenzaprine (FLEXERIL) 10 MG tablet Take 1 tablet (10 mg total) by mouth 2 (two) times daily as needed for muscle spasms. 14 tablet 0  . fluticasone-salmeterol (ADVAIR HFA) 230-21 MCG/ACT inhaler Inhale 2  puffs into the lungs 2 (two) times daily. 3 Inhaler 1  . gabapentin (NEURONTIN) 600 MG tablet Take 600 mg by mouth 3 (three) times daily.    . hydrOXYzine (ATARAX/VISTARIL) 25 MG tablet Take 1 tablet (25 mg total) by mouth 3 (three) times daily as needed. 30 tablet 0  . lisinopril (PRINIVIL,ZESTRIL) 20 MG tablet Take 1 tablet (20 mg total) by mouth daily. 90 tablet 1  . montelukast (SINGULAIR) 10 MG tablet TAKE 1 TABLET(10 MG) BY MOUTH AT BEDTIME 90 tablet 1  . Multiple Vitamins-Minerals (HM MULTIVITAMIN ADULT GUMMY) CHEW Chew 2 tablets by mouth daily.     . pantoprazole (PROTONIX) 20 MG tablet Take 1 tablet (20 mg total) by mouth daily. 30 tablet 0  . tiZANidine (ZANAFLEX) 2 MG tablet TK 1 T PO Q 8 H AS NEEDED. NTE 3 DOSES IN 24 H 90 DAY SUPPLY  1  . ALPRAZolam (XANAX) 0.25 MG tablet Take 1 tablet (0.25 mg total) by mouth 2 (two) times daily as needed for anxiety. 30 tablet 0  . oxyCODONE-acetaminophen (PERCOCET) 10-325 MG tablet TK 1 T PO Q 6 H PRN 120 tablet 0  . sertraline (ZOLOFT) 50 MG tablet Take 1 tablet (50 mg total) by mouth daily. 90 tablet 0   No current facility-administered medications on file prior to visit.     Allergies  Allergen Reactions  . Aspirin Swelling  . Hydrocodone Itching    Family History  Problem Relation Age of Onset  . Diabetes Mother  Living  . Sleep apnea Mother   . Diabetes Father 64       Deceased  . Hypertension Father   . Jaundice Father   . Hemophilia Father   . Alcoholism Father   . Cancer Maternal Grandfather        Throat  . Esophageal cancer Maternal Grandfather   . Cancer Paternal Uncle        Throat  . Esophageal cancer Paternal Uncle   . Asthma Sister   . Diabetes Sister   . Healthy Son        X1  . Healthy Daughter        X1  . Colon polyps Neg Hx   . Colon cancer Neg Hx   . Rectal cancer Neg Hx   . Stomach cancer Neg Hx     Social History   Socioeconomic History  . Marital status: Divorced    Spouse name: Not  on file  . Number of children: Not on file  . Years of education: Not on file  . Highest education level: Not on file  Occupational History  . Not on file  Social Needs  . Financial resource strain: Not on file  . Food insecurity:    Worry: Not on file    Inability: Not on file  . Transportation needs:    Medical: Not on file    Non-medical: Not on file  Tobacco Use  . Smoking status: Never Smoker  . Smokeless tobacco: Never Used  Substance and Sexual Activity  . Alcohol use: Yes    Alcohol/week: 0.0 standard drinks    Comment: OCCAS  . Drug use: No  . Sexual activity: Not Currently  Lifestyle  . Physical activity:    Days per week: Not on file    Minutes per session: Not on file  . Stress: Not on file  Relationships  . Social connections:    Talks on phone: Not on file    Gets together: Not on file    Attends religious service: Not on file    Active member of club or organization: Not on file    Attends meetings of clubs or organizations: Not on file    Relationship status: Not on file  Other Topics Concern  . Not on file  Social History Narrative  . Not on file    Review of Systems - See HPI.  All other ROS are negative.  BP (!) 138/98   Pulse 93   Temp 98.7 F (37.1 C) (Oral)   Resp 16   Ht 5' 7"  (1.702 m)   Wt 254 lb (115.2 kg)   SpO2 98%   BMI 39.78 kg/m   Physical Exam Vitals signs reviewed.  Constitutional:      Appearance: Normal appearance.  HENT:     Head: Normocephalic and atraumatic.  Neck:     Musculoskeletal: Neck supple.  Cardiovascular:     Rate and Rhythm: Normal rate and regular rhythm.     Heart sounds: Normal heart sounds.  Pulmonary:     Effort: Pulmonary effort is normal.  Skin:      Neurological:     Mental Status: He is alert.     Recent Results (from the past 2160 hour(s))  Pain Mgmt, Profile 8 w/Conf, U     Status: Abnormal   Collection Time: 09/28/18  9:31 AM  Result Value Ref Range   Creatinine 89.0 > or = 20.  mg/dL   pH 4.99 4.5 -  9.0   Oxidant NEGATIVE <200 mcg/mL   Amphetamines NEGATIVE <500 ng/mL   medMATCH Amphetamines CONSISTENT    Benzodiazepines NEGATIVE <100 ng/mL   medMATCH Benzodiazepines CONSISTENT    Marijuana Metabolite NEGATIVE <20 ng/mL   medMATCH Marijuana Metab CONSISTENT    Cocaine Metabolite NEGATIVE <150 ng/mL   medMATCH Cocaine Metab CONSISTENT    Opiates NEGATIVE <100 ng/mL   medMATCH Opiates CONSISTENT    Oxycodone POSITIVE (A) <100 ng/mL   Noroxycodone 88 (H) <50 ng/mL    Comment: See Note 1   medMATCH Noroxycodone INCONSISTENT     Comment: See Note 2   Oxycodone NEGATIVE <50 ng/mL    Comment: See Note 1   medMATCH Oxycodone CONSISTENT    Oxymorphone 66 (H) <50 ng/mL    Comment: See Note 1   medMATCH Oxymorphone INCONSISTENT     Comment: See Note 3   Buprenorphine, Urine NEGATIVE <5 ng/mL   medMATCH Buprenorphine CONSISTENT    MDMA NEGATIVE <500 ng/mL   [facility] MDMA CONSISTENT    Alcohol Metabolites POSITIVE (A) <500 ng/mL   Ethyl Glucuronide (ETG) 4,013 (H) <500 ng/mL    Comment: See Note 1   medMATCH ETG INCONSISTENT    Ethyl Sulfate (ETS) 1,284 (H) <100 ng/mL    Comment: See Note 1   medMATCH ETS INCONSISTENT    6 Acetylmorphine NEGATIVE <10 ng/mL   medMATCH 6 Acetylmorphine CONSISTENT     Comment: See Note 4 See Note 4 See Note 4 See Note 4 See Note 4 . Note 1 . This test was developed and its analytical performance  characteristics have been determined by [employer]. It has not been cleared or approved by the FDA. This assay has been validated pursuant to the CLIA  regulations and is used for clinical purposes. . Note 2 Noroxycodone is a metabolite of Oxycodone. . Note 3 Oxymorphone is a metabolite of oxycodone as well as  a prescribed drug. Marland Kitchen Note 4 This drug testing is for medical treatment only.   Analysis was performed as non-forensic testing and  these results should be used only by healthcare  providers to render  diagnosis or treatment, or to  monitor progress of medical conditions. Hazel Sams comments are:  - present when drug test results may be the result of     metabolism of one or more drugs or when results are     inconsistent with prescribed medication(s) listed.  - may be blank when drug results are consistent with     pres cribed medication(s) listed. . For assistance with interpreting these drug results,  please contact a [employer] Toxicology  Specialist: [phone] [place] [phone]), M-F,  8am-6pm EST.     Assessment/Plan: 1. Chronic back pain greater than 3 months duration CSC on file. UDS to be updated today along with CMP. CS database reviewed. No red flags. Medication refilled. Follow-up 3 months.  - Pain Mgmt, Profile 8 w/Conf, U - Comp Met (CMET)  2. Seborrheic keratosis 2 cm x 2 cm on left upper back. Irritated. Cryotherapy x 1 applied to lesion. Discussed may need repeat treatment.    Leeanne Rio, PA-C

## 2018-12-24 NOTE — Patient Instructions (Addendum)
Please keep skin clean and dry. If the area is not falling off in the next week or so, please come back for a repeat treatment.  Continue pain medications as directed.    Seborrheic Keratosis A seborrheic keratosis is a common, noncancerous (benign) skin growth. These growths are velvety, waxy, rough, tan, brown, or black spots that appear on the skin. These skin growths can be flat or raised, and scaly. What are the causes? The cause of this condition is not known. What increases the risk? You are more likely to develop this condition if you:  Have a family history of seborrheic keratosis.  Are 50 or older.  Are pregnant.  Have had estrogen replacement therapy. What are the signs or symptoms? Symptoms of this condition include growths on the face, chest, shoulders, back, or other areas. These growths:  Are usually painless, but may become irritated and itchy.  Can be yellow, brown, black, or other colors.  Are slightly raised or have a flat surface.  Are sometimes rough or wart-like in texture.  Are often velvety or waxy on the surface.  Are round or oval-shaped.  Often occur in groups, but may occur as a single growth. How is this diagnosed? This condition is diagnosed with a medical history and physical exam.  A sample of the growth may be tested (skin biopsy).  You may need to see a skin specialist (dermatologist). How is this treated? Treatment is not usually needed for this condition, unless the growths are irritated or bleed often.  You may also choose to have the growths removed if you do not like their appearance. ? Most commonly, these growths are treated with a procedure in which liquid nitrogen is applied to "freeze" off the growth (cryosurgery). ? They may also be burned off with electricity (electrocautery) or removed by scraping (curettage). Follow these instructions at home:  Watch your growth for any changes.  Keep all follow-up visits as told by  your health care provider. This is important.  Do not scratch or pick at the growth or growths. This can cause them to become irritated or infected. Contact a health care provider if:  You suddenly have many new growths.  Your growth bleeds, itches, or hurts.  Your growth suddenly becomes larger or changes color. Summary  A seborrheic keratosis is a common, noncancerous (benign) skin growth.  Treatment is not usually needed for this condition, unless the growths are irritated or bleed often.  Watch your growth for any changes.  Contact a health care provider if you suddenly have many new growths or your growth suddenly becomes larger or changes color.  Keep all follow-up visits as told by your health care provider. This is important. This information is not intended to replace advice given to you by your health care provider. Make sure you discuss any questions you have with your health care provider. Document Released: 11/22/2010 Document Revised: 03/04/2018 Document Reviewed: 03/04/2018 Elsevier Interactive Patient Education  2019 [employer].

## 2018-12-27 ENCOUNTER — Ambulatory Visit: Payer: Medicare HMO | Admitting: Physician Assistant

## 2018-12-28 LAB — PAIN MGMT, PROFILE 8 W/CONF, U
6 Acetylmorphine: NEGATIVE ng/mL (ref <10)
Alcohol Metabolites: POSITIVE ng/mL — AB (ref <500)
Alphahydroxyalprazolam: 38 ng/mL — ABNORMAL HIGH (ref <25)
Alphahydroxymidazolam: NEGATIVE ng/mL (ref <50)
Alphahydroxytriazolam: NEGATIVE ng/mL (ref <50)
Aminoclonazepam: NEGATIVE ng/mL (ref <25)
Amphetamines: NEGATIVE ng/mL (ref <500)
BUPRENORPHINE, URINE: NEGATIVE ng/mL (ref <5)
Benzodiazepines: POSITIVE ng/mL — AB (ref <100)
Cocaine Metabolite: NEGATIVE ng/mL (ref <150)
Creatinine: 221.1 mg/dL
Ethyl Glucuronide (ETG): 1375 ng/mL — ABNORMAL HIGH (ref <500)
Ethyl Sulfate (ETS): 538 ng/mL — ABNORMAL HIGH (ref <100)
Hydroxyethylflurazepam: NEGATIVE ng/mL (ref <50)
Lorazepam: NEGATIVE ng/mL (ref <50)
MDMA: NEGATIVE ng/mL (ref <500)
Marijuana Metabolite: NEGATIVE ng/mL (ref <20)
NORDIAZEPAM: NEGATIVE ng/mL (ref <50)
Noroxycodone: 554 ng/mL — ABNORMAL HIGH (ref <50)
OPIATES: NEGATIVE ng/mL (ref <100)
Oxazepam: NEGATIVE ng/mL (ref <50)
Oxidant: NEGATIVE ug/mL (ref <200)
Oxycodone: 256 ng/mL — ABNORMAL HIGH (ref <50)
Oxycodone: POSITIVE ng/mL — AB (ref <100)
Oxymorphone: 531 ng/mL — ABNORMAL HIGH (ref <50)
Temazepam: NEGATIVE ng/mL (ref <50)
pH: 5.19 (ref 4.5–9.0)

## 2018-12-30 ENCOUNTER — Telehealth: Payer: Self-pay

## 2018-12-30 NOTE — Telephone Encounter (Signed)
Spoke with patient on the phone to discuss lab results. Patient stated that he was confused by the results from the urine drug screen. He stated that he and [name] had discussed in his previous that he should cut back on alcohol consumption, not to completely cut out alcohol. He kept mentioning that his alcohol level had drastically dropped since the last UDS. He mentioned that he had been on "more powerful" pain meds in the past 5 years and that he was able to stop on his own. Pt seemed to be highly defensive towards me. I offered to discuss UDS results, but patient verbalized that he was not understanding what I was saying. Call was disconnected and did not answer when I called back. Pt is scheduled for lab only visit in 4 weeks to recheck LFTs.

## 2018-12-30 NOTE — Telephone Encounter (Signed)
We discussed previously that he needed to wean down on the alcohol, not to stop cold [place]. He will need to continue the wean down on the alcohol until he has completely stopped. Not to use with current medication regimen.

## 2019-01-04 ENCOUNTER — Other Ambulatory Visit: Payer: Self-pay | Admitting: Physician Assistant

## 2019-01-19 ENCOUNTER — Other Ambulatory Visit: Payer: Self-pay | Admitting: Physician Assistant

## 2019-01-19 MED ORDER — OXYCODONE-ACETAMINOPHEN 10-325 MG PO TABS: ORAL_TABLET | ORAL | 0 refills | Status: DC

## 2019-01-19 NOTE — Telephone Encounter (Signed)
Copied from [place] [id number]. Topic: Quick Communication - Rx Refill/Question >> Jan 19, 2019  1:30 PM Daniel Durham wrote: Medication: oxyCODONE-acetaminophen (PERCOCET) 10-325 MG tablet   Patient is requesting a refill of this medication.  Preferred Pharmacy (with phone number or street name):[facility] [place], [place]  [phone] (Phone) [phone] (Fax)

## 2019-01-19 NOTE — Telephone Encounter (Signed)
Xanax last rx 12/24/18 #30 CSC: 05/25/18 UDS: 12/24/18 Last OV 12/24/18  Please advise

## 2019-01-19 NOTE — Telephone Encounter (Signed)
Requested Prescriptions   Pending Prescriptions Disp Refills  . oxyCODONE-acetaminophen (PERCOCET) 10-325 MG tablet 120 tablet 0    Sig: TK 1 T PO Q 6 H PRN    Last OV: 12/24/2018 Last Filled: 12/24/2018 #120, 0

## 2019-02-10 ENCOUNTER — Emergency Department (HOSPITAL_COMMUNITY): Payer: Medicare HMO

## 2019-02-10 ENCOUNTER — Other Ambulatory Visit: Payer: Self-pay

## 2019-02-10 ENCOUNTER — Ambulatory Visit: Payer: Self-pay | Admitting: *Deleted

## 2019-02-10 ENCOUNTER — Emergency Department (HOSPITAL_COMMUNITY)
Admission: EM | Admit: 2019-02-10 | Discharge: 2019-02-10 | Disposition: A | Discharge status: Home / Self Care | Payer: Medicare HMO | Attending: Emergency Medicine | Admitting: Emergency Medicine

## 2019-02-10 ENCOUNTER — Encounter (HOSPITAL_COMMUNITY): Payer: Self-pay

## 2019-02-10 DIAGNOSIS — J45909 Unspecified asthma, uncomplicated: Secondary | ICD-10-CM | POA: Diagnosis not present

## 2019-02-10 DIAGNOSIS — I10 Essential (primary) hypertension: Secondary | ICD-10-CM | POA: Insufficient documentation

## 2019-02-10 DIAGNOSIS — E860 Dehydration: Secondary | ICD-10-CM | POA: Diagnosis not present

## 2019-02-10 DIAGNOSIS — A09 Infectious gastroenteritis and colitis, unspecified: Secondary | ICD-10-CM | POA: Diagnosis not present

## 2019-02-10 DIAGNOSIS — R0602 Shortness of breath: Secondary | ICD-10-CM | POA: Diagnosis not present

## 2019-02-10 DIAGNOSIS — R079 Chest pain, unspecified: Secondary | ICD-10-CM | POA: Diagnosis present

## 2019-02-10 DIAGNOSIS — Z79899 Other long term (current) drug therapy: Secondary | ICD-10-CM | POA: Diagnosis not present

## 2019-02-10 DIAGNOSIS — R05 Cough: Secondary | ICD-10-CM | POA: Diagnosis not present

## 2019-02-10 DIAGNOSIS — R197 Diarrhea, unspecified: Secondary | ICD-10-CM | POA: Insufficient documentation

## 2019-02-10 DIAGNOSIS — R0789 Other chest pain: Secondary | ICD-10-CM | POA: Diagnosis not present

## 2019-02-10 LAB — COMPREHENSIVE METABOLIC PANEL
ALT: 40 U/L (ref 0–44)
AST: 35 U/L (ref 15–41)
Albumin: 3.8 g/dL (ref 3.5–5.0)
Alkaline Phosphatase: 74 U/L (ref 38–126)
Anion gap: 11 (ref 5–15)
BUN: 9 mg/dL (ref 6–20)
CO2: 21 mmol/L — ABNORMAL LOW (ref 22–32)
Calcium: 8.6 mg/dL — ABNORMAL LOW (ref 8.9–10.3)
Chloride: 101 mmol/L (ref 98–111)
Creatinine, Ser: 1.44 mg/dL — ABNORMAL HIGH (ref 0.61–1.24)
GFR calc Af Amer: >60 mL/min (ref >60)
GFR calc non Af Amer: 56 mL/min — ABNORMAL LOW (ref >60)
Glucose, Bld: 101 mg/dL — ABNORMAL HIGH (ref 70–99)
Potassium: 3.8 mmol/L (ref 3.5–5.1)
Sodium: 133 mmol/L — ABNORMAL LOW (ref 135–145)
Total Bilirubin: 0.7 mg/dL (ref 0.3–1.2)
Total Protein: 6.8 g/dL (ref 6.5–8.1)

## 2019-02-10 LAB — CBC WITH DIFFERENTIAL/PLATELET
Abs Immature Granulocytes: 0.02 10*3/uL (ref 0.00–0.07)
Basophils Absolute: 0 10*3/uL (ref 0.0–0.1)
Basophils Relative: 0 %
Eosinophils Absolute: 0 10*3/uL (ref 0.0–0.5)
Eosinophils Relative: 0 %
HCT: 43.9 % (ref 39.0–52.0)
Hemoglobin: 15 g/dL (ref 13.0–17.0)
Immature Granulocytes: 0 %
Lymphocytes Relative: 12 %
Lymphs Abs: 1.1 10*3/uL (ref 0.7–4.0)
MCH: 33.2 pg (ref 26.0–34.0)
MCHC: 34.2 g/dL (ref 30.0–36.0)
MCV: 97.1 fL (ref 80.0–100.0)
Monocytes Absolute: 0.8 10*3/uL (ref 0.1–1.0)
Monocytes Relative: 9 %
Neutro Abs: 7.1 10*3/uL (ref 1.7–7.7)
Neutrophils Relative %: 79 %
Platelets: 144 10*3/uL — ABNORMAL LOW (ref 150–400)
RBC: 4.52 MIL/uL (ref 4.22–5.81)
RDW: 13 % (ref 11.5–15.5)
WBC: 9 10*3/uL (ref 4.0–10.5)
nRBC: 0 % (ref 0.0–0.2)

## 2019-02-10 LAB — TROPONIN I
Troponin I: <0.03 ng/mL (ref <0.03)
Troponin I: <0.03 ng/mL (ref <0.03)

## 2019-02-10 LAB — BRAIN NATRIURETIC PEPTIDE: B Natriuretic Peptide: 15.9 pg/mL (ref 0.0–100.0)

## 2019-02-10 MED ORDER — PREDNISONE 20 MG PO TABS
60.0000 mg | ORAL_TABLET | Freq: Once | ORAL | Status: AC
  Administered 2019-02-10: 19:00:00 60 mg via ORAL
  Filled 2019-02-10: qty 3

## 2019-02-10 MED ORDER — ALBUTEROL SULFATE HFA 108 (90 BASE) MCG/ACT IN AERS
4.0000 | INHALATION_SPRAY | Freq: Once | RESPIRATORY_TRACT | Status: AC
  Administered 2019-02-10: 16:00:00 4 via RESPIRATORY_TRACT
  Filled 2019-02-10: qty 6.7

## 2019-02-10 MED ORDER — OXYCODONE-ACETAMINOPHEN 5-325 MG PO TABS
2.0000 | ORAL_TABLET | Freq: Once | ORAL | Status: AC
  Administered 2019-02-10: 19:00:00 2 via ORAL
  Filled 2019-02-10: qty 2

## 2019-02-10 MED ORDER — BENZONATATE 100 MG PO CAPS
200.0000 mg | ORAL_CAPSULE | Freq: Once | ORAL | Status: AC
  Administered 2019-02-10: 19:00:00 200 mg via ORAL
  Filled 2019-02-10: qty 2

## 2019-02-10 MED ORDER — LACTATED RINGERS IV BOLUS
2000.0000 mL | Freq: Once | INTRAVENOUS | Status: AC
  Administered 2019-02-10: 17:00:00 2000 mL via INTRAVENOUS

## 2019-02-10 MED ORDER — PREDNISONE 10 MG PO TABS: 60.0000 mg | ORAL_TABLET | Freq: Every day | ORAL | 0 refills | Status: AC

## 2019-02-10 MED ORDER — ALBUTEROL SULFATE HFA 108 (90 BASE) MCG/ACT IN AERS
4.0000 | INHALATION_SPRAY | Freq: Once | RESPIRATORY_TRACT | Status: AC
  Administered 2019-02-10: 19:00:00 4 via RESPIRATORY_TRACT

## 2019-02-10 MED ORDER — GABAPENTIN 300 MG PO CAPS
600.0000 mg | ORAL_CAPSULE | Freq: Once | ORAL | Status: AC
  Administered 2019-02-10: 600 mg via ORAL
  Filled 2019-02-10: qty 2

## 2019-02-10 NOTE — ED Notes (Signed)
Pt ambulated around room, lowest sat was 96% pt stated he felt more fatigued when walking but not more SOB, pt seemed more SOB

## 2019-02-10 NOTE — ED Notes (Signed)
Pt had an episode of diarrhea

## 2019-02-10 NOTE — Discharge Instructions (Signed)
You may use imodium to help control diarrhea You may find Pedialyte will help you stay hydrated better than water alone Please take 3 days of prednisone for your shortness of breath as your asthma is likely contributing to this  Call your doctor in 2 days to arrange a phone follow up for your diarrhea, shortness of breath, and dehydration

## 2019-02-10 NOTE — Telephone Encounter (Signed)
Pt called COVID line stating that he has not been feeling well since 02/07/2019; he is experiencing shortness of breath even without exertion, chest tightness, weakness, non-productive cough, and diarrhea; he says that he is not makng as much urine as he normally does; the pt also has decreased appetite; the pt says that his worsened overnight;he does not have a thermometer to check his temperature but he feels like he has one;  he is not sure if he has been exposed the corona virus, but he wants to safe; recommendations made per nurse triage protocol; the pt verbalized understanding and he will be going to [facility]; 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 Reason for Disposition . [1] Difficulty breathing occurs AND [2] within 14 days of COVID-19 EXPOSURE (Close Contact) . Patient sounds very sick or weak to the triager  Answer Assessment - Initial Assessment Questions 1. CLOSE CONTACT: "Who is the person with the confirmed or suspected COVID-19 infection that you were exposed to?"     Not sure; been to public places to get supplies 2. PLACE of CONTACT: "Where were you when you were exposed to COVID-19?" (e.g., home, school, medical waiting room; which city?)     [place], [place] 3. TYPE of CONTACT: "How much contact was there?" (e.g., sitting next to, live in same house, work in same office, same building)     Same building 4. DURATION of CONTACT: "How long were you in contact with the COVID-19 patient?" (e.g., a few seconds, passed by person, a few minutes, live with the patient)     minutes 5. DATE of CONTACT: "When did you have contact with a COVID-19 patient?" (e.g., how many days ago)     Not sure 6. TRAVEL: "Have you traveled out of the country recently?" If so, "When and where?"     * Also ask about out-of-state  travel, since the CDC has identified some high risk cities for community spread in the [place].     * Note: Travel becomes less relevant if there is widespread community transmission where the patient lives.    no 7. COMMUNITY SPREAD: "Are there lots of cases or COVID-19 (community spread) where you live?" (See public health department website, if unsure)   * MAJOR community spread: high number of cases; numbers of cases are increasing; many people hospitalized.   * MINOR community spread: low number of cases; not increasing; few or no people hospitalized     major 8. SYMPTOMS: "Do you have any symptoms?" (e.g., fever, cough, breathing difficulty)     Shortness of breath, cough, chest tightness weakness, diarrhea 9. PREGNANCY OR POSTPARTUM: "Is there any chance you are pregnant?" "When was your last menstrual period?" "Did you deliver in the last 2 weeks?"    n/a 10. HIGH RISK: "Do you have any heart or lung problems? Do you have a weak immune system?" (e.g., CHF, COPD, asthma, HIV positive, chemotherapy, renal failure, diabetes mellitus, sickle cell anemia)       Asthma, high blood pressure  Answer Assessment - Initial Assessment Questions 1. COVID-19 DIAGNOSIS: "Who made your Coronavirus (COVID-19) diagnosis?" "Was it confirmed by a positive lab test?" If not diagnosed by a HCP, ask "Are there lots of cases (community spread) where you live?" (See public health department website, if unsure)   *  MAJOR community spread: high number of cases; numbers of cases are increasing; many people hospitalized.   * MINOR community spread: low number of cases; not increasing; few or no people hospitalized     major 2. ONSET: "When did the COVID-19 symptoms start?"      02/07/2019 3. WORST SYMPTOM: "What is your worst symptom?" (e.g., cough, fever, shortness of breath, muscle aches)   Shortness of breath 4. COUGH: "How bad is the cough?"       severe 5. FEVER: "Do you have a fever?" If so, ask: "What is your  temperature, how was it measured, and when did it start?"     Pt not sure 6. RESPIRATORY STATUS: "Describe your breathing?" (e.g., shortness of breath, wheezing, unable to speak)      Short of breath at rest, chest tightness 7. BETTER-SAME-WORSE: "Are you getting better, staying the same or getting worse compared to yesterday?"  If getting worse, ask, "In what way?"     Shortness of breath without exertion, chest tightness worsening pm 02/09/2019 8. HIGH RISK DISEASE: "Do you have any chronic medical problems?" (e.g., asthma, heart or lung disease, weak immune system, etc.)    asthma 9. PREGNANCY: "Is there any chance you are pregnant?" "When was your last menstrual period?"     n/a 10. OTHER SYMPTOMS: "Do you have any other symptoms?"  (e.g., runny nose, headache, sore throat, loss of smell)      "bad"diarrhea, cough, deceased urine output, decreased appetite, weakness  Protocols used: CORONAVIRUS (COVID-19) EXPOSURE-A-AH, CORONAVIRUS (COVID-19) DIAGNOSED OR SUSPECTED-A-AH

## 2019-02-10 NOTE — ED Provider Notes (Signed)
[facility] Provider Note   CSN: [id number] Arrival date & time: 02/10/19  1541    History   Chief Complaint No chief complaint on file.   HPI Daniel Durham is a 53 y.o. male.      Shortness of Breath  Associated symptoms: chest pain, cough, diaphoresis, fever (subjective, no thermometer at home) and headaches   Associated symptoms: no abdominal pain, no rash, no vomiting and no wheezing   Diarrhea  Quality:  Copious and watery Severity:  Severe Onset quality:  Sudden Duration:  2 days Timing:  Constant Progression:  Unchanged Relieved by:  None tried Associated symptoms: diaphoresis, fever (subjective, no thermometer at home), headaches and URI   Associated symptoms: no abdominal pain, no recent cough and no vomiting   Risk factors: no recent antibiotic use, no sick contacts, no suspicious food intake and no travel to endemic areas   Fever  Associated symptoms: chest pain, cough, diarrhea, headaches and nausea   Associated symptoms: no congestion, no dysuria, no rash, no rhinorrhea and no vomiting     Past Medical History:  Diagnosis Date  . Allergy   . Anxiety   . Asthma    SEASONAL   . Blood transfusion without reported diagnosis   . Chronic back pain    Neurostimulator  . Depression   . Environmental allergies   . Hypertension   . Neuromuscular disorder ([place])   . Neuropathy   . Seasonal allergic conjunctivitis     Patient Active Problem List   Diagnosis Date Noted  . Colon cancer screening 05/25/2018  . Anxiety and depression 03/03/2017  . Cervical disc disorder with radiculopathy of cervical region 07/24/2015  . 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 Exacerbation of chronic back pain 03/20/2015  . 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 Visit for preventive health examination 02/04/2015  . Chronic back pain greater than 3 months duration 01/23/2015  .  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 Amputation stump complication ([place]) 73/22/0254    Past Surgical History:  Procedure Laterality Date  . BACK SURGERY    . CARDIAC CATHETERIZATION     11/05/10 [facility] [place]): [place] of mLAD, no sign disease. EF 45% in RAO, 60% LAO (51% stress, 54% rest by NM stress; 57-59% echo 10/2010)  . CARPAL TUNNEL RELEASE     Bilateral  . CERVICAL DISCECTOMY  12/17/2017   titanium plates 12-17-17  . COLONOSCOPY    . HAND AMPUTATION  2007  . POLYPECTOMY     pt states had colon in [place] and he had polyps- he has no idea what type   . ROTATOR CUFF REPAIR     Left  . SPINAL CORD STIMULATOR IMPLANT    . SPINAL CORD STIMULATOR REMOVAL N/A 10/26/2015   Procedure: LUMBAR SPINAL CORD STIMULATOR REMOVAL;  Surgeon: Clydell Hakim, MD;  Location: [facility];  Service: Neurosurgery;  Laterality: N/A;        Home Medications    Prior to Admission medications   Medication Sig Start Date End Date Taking? Authorizing Provider  albuterol (PROVENTIL) (2.5 MG/3ML) 0.083% nebulizer solution Take 3 mLs (2.5 mg total) by nebulization every 6 (six) hours as needed for wheezing or shortness of breath. 12/24/18   Brunetta Jeans, PA-C  albuterol (VENTOLIN HFA) 108 (90 Base) MCG/ACT inhaler INHALE 2 PUFFS BY MOUTH EVERY 6 HOURS AS NEEDED FOR WHEEZING 02/15/18   Raiford Noble  C, PA-C  ALPRAZolam (XANAX) 0.25 MG tablet TAKE 1 TABLET(0.25 MG) BY MOUTH TWICE DAILY AS NEEDED FOR ANXIETY 01/19/19   Brunetta Jeans, PA-C  cyclobenzaprine (FLEXERIL) 10 MG tablet Take 1 tablet (10 mg total) by mouth 2 (two) times daily as needed for muscle spasms. 12/06/17   Langston Masker B, PA-C  fluticasone-salmeterol (ADVAIR HFA) 230-21 MCG/ACT inhaler Inhale 2 puffs into the lungs 2 (two) times daily. 01/31/15   Brunetta Jeans, PA-C  gabapentin (NEURONTIN) 600 MG tablet Take 600 mg by mouth 3 (three) times daily.    [provider]  hydrOXYzine  (ATARAX/VISTARIL) 25 MG tablet Take 1 tablet (25 mg total) by mouth 3 (three) times daily as needed. 09/23/18   Brunetta Jeans, PA-C  lisinopril (PRINIVIL,ZESTRIL) 20 MG tablet TAKE 1 TABLET(20 MG) BY MOUTH DAILY 01/04/19   Brunetta Jeans, PA-C  montelukast (SINGULAIR) 10 MG tablet TAKE 1 TABLET(10 MG) BY MOUTH AT BEDTIME 09/23/18   Brunetta Jeans, PA-C  Multiple Vitamins-Minerals (HM MULTIVITAMIN ADULT GUMMY) CHEW Chew 2 tablets by mouth daily.     [provider]  oxyCODONE-acetaminophen (PERCOCET) 10-325 MG tablet TK 1 T PO Q 6 H PRN 01/19/19   Brunetta Jeans, PA-C  pantoprazole (PROTONIX) 20 MG tablet Take 1 tablet (20 mg total) by mouth daily. 02/16/18   Charlesetta Shanks, MD  sertraline (ZOLOFT) 50 MG tablet Take 1 tablet (50 mg total) by mouth daily. 12/24/18   Brunetta Jeans, PA-C  tiZANidine (ZANAFLEX) 2 MG tablet TK 1 T PO Q 8 H AS NEEDED. NTE 3 DOSES IN 24 H 90 DAY SUPPLY 02/11/18   [provider]    Family History Family History  Problem Relation Age of Onset  . Diabetes Mother        Living  . Sleep apnea Mother   . Diabetes Father 23       Deceased  . Hypertension Father   . Jaundice Father   . Hemophilia Father   . Alcoholism Father   . Cancer Maternal Grandfather        Throat  . Esophageal cancer Maternal Grandfather   . Cancer Paternal Uncle        Throat  . Esophageal cancer Paternal Uncle   . Asthma Sister   . Diabetes Sister   . Healthy Son        X1  . Healthy Daughter        X1  . Colon polyps Neg Hx   . Colon cancer Neg Hx   . Rectal cancer Neg Hx   . Stomach cancer Neg Hx     Social History Social History   Tobacco Use  . Smoking status: Never Smoker  . Smokeless tobacco: Never Used  Substance Use Topics  . Alcohol use: Yes    Alcohol/week: 0.0 standard drinks    Comment: OCCAS  . Drug use: No     Allergies   Aspirin and Hydrocodone   Review of Systems Review of Systems  Constitutional: Positive for  activity change, appetite change, diaphoresis and fever (subjective, no thermometer at home).  HENT: Negative for congestion and rhinorrhea.   Eyes: Negative for photophobia and redness.  Respiratory: Positive for cough, chest tightness and shortness of breath. Negative for wheezing.   Cardiovascular: Positive for chest pain.  Gastrointestinal: Positive for diarrhea and nausea. Negative for abdominal pain and vomiting.  Endocrine: Negative for polyphagia and polyuria.  Genitourinary: Positive for decreased urine volume. Negative for  dysuria, frequency and hematuria.  Skin: Negative for rash and wound.  Allergic/Immunologic: Negative for immunocompromised state.  Neurological: Positive for headaches.  Hematological: Does not bruise/bleed easily.     Physical Exam Updated Vital Signs BP (!) 185/92 (BP Location: Right Arm)   Pulse 93   Temp 99.2 F (37.3 C) (Oral)   Resp 20   SpO2 92%   Physical Exam Vitals signs and nursing note reviewed.  Constitutional:      Appearance: He is well-developed. He is not ill-appearing or diaphoretic.  HENT:     Head: Normocephalic and atraumatic.  Eyes:     Conjunctiva/sclera: Conjunctivae normal.  Neck:     Musculoskeletal: Neck supple.  Cardiovascular:     Rate and Rhythm: Normal rate and regular rhythm.     Heart sounds: No murmur.  Pulmonary:     Effort: Pulmonary effort is normal. No respiratory distress.     Breath sounds: Normal breath sounds.  Chest:     Chest wall: No tenderness or edema.  Abdominal:     Palpations: Abdomen is soft.     Tenderness: There is no abdominal tenderness.  Musculoskeletal:     Right lower leg: He exhibits no tenderness. No edema.     Left lower leg: He exhibits no tenderness. No edema.     Comments: LUE amputation at forearm.   Skin:    General: Skin is warm and dry.     Findings: No ecchymosis or erythema.  Neurological:     General: No focal deficit present.     Mental Status: He is alert and  oriented to person, place, and time.     Motor: No weakness.      ED Treatments / Results  Labs (all labs ordered are listed, but only abnormal results are displayed) Labs Reviewed - No data to display  EKG None  Radiology No results found.  Procedures Procedures (including critical care time)  Medications Ordered in ED Medications - No data to display   Initial Impression / Assessment and Plan / ED Course  I have reviewed the triage vital signs and the nursing notes.  Pertinent labs & imaging results that were available during my care of the patient were reviewed by me and considered in my medical decision making (see chart for details).        HEAR of 3.  EKG with NSR at 93 BPM.  No ST segment abnormalities.  Normal intervals. He complains of 2 days of watery diarrhea and nausea.  Abdomen nontender.  Doubt ruptured viscous membrane, obstruction, diverticulitis, impaction. More likely acute gastroenteritis.  Also complains of chest tightness. Last stress test 10 years ago, he believes it was normal.  Has a hx of asthma so 4 puffs given with some improvement of SOB and with improvement of his cough.  Not hypoxic when ambulating. HDS otherwise under my care.  Repeat troponin undetectable.  Possibly has URI given intermittent fevers and possible COVID-19 infection, but given overall well appearance and stable vitals, the patient is amenable to DC and home isolation.  Precautions given and isolation recommendations begun.  No signs of DVT to suggest PE.  Fevers to be controlled with tylenol at home. No signs of pneumonia on CXR. Prednisone started for possible asthma exacerbation in the setting of URI. Patient with 1 episode of watery diarrhea in the ED.  Given fluid bolus for mild pre-renal AKI.  Will follow up in 2 days with PCP. Told to drink pedialyte and  use immodium for bowel movements.  Return precautions given.    Final Clinical Impressions(s) / ED Diagnoses   Final  diagnoses:  Dehydration  Mild asthma without complication, unspecified whether persistent  Diarrhea of presumed infectious origin    ED Discharge Orders    None       Andee Poles, MD 02/10/19 2201    Duffy Bruce, MD 02/11/19 1339

## 2019-02-10 NOTE — ED Triage Notes (Signed)
Pt reports since Friday he has had SOB, chest tightness, diarrhea, subjective fever and generalized weakness. Pt a.o, nad noted at this time

## 2019-02-12 ENCOUNTER — Other Ambulatory Visit: Payer: Self-pay

## 2019-02-12 ENCOUNTER — Emergency Department (HOSPITAL_BASED_OUTPATIENT_CLINIC_OR_DEPARTMENT_OTHER): Payer: Medicare HMO

## 2019-02-12 ENCOUNTER — Emergency Department (HOSPITAL_BASED_OUTPATIENT_CLINIC_OR_DEPARTMENT_OTHER)
Admission: EM | Admit: 2019-02-12 | Discharge: 2019-02-12 | Disposition: A | Discharge status: Home / Self Care | Payer: Medicare HMO | Attending: Emergency Medicine | Admitting: Emergency Medicine

## 2019-02-12 ENCOUNTER — Other Ambulatory Visit: Payer: Self-pay | Admitting: Physician Assistant

## 2019-02-12 ENCOUNTER — Encounter (HOSPITAL_BASED_OUTPATIENT_CLINIC_OR_DEPARTMENT_OTHER): Payer: Self-pay | Admitting: Emergency Medicine

## 2019-02-12 DIAGNOSIS — R52 Pain, unspecified: Secondary | ICD-10-CM | POA: Diagnosis not present

## 2019-02-12 DIAGNOSIS — N390 Urinary tract infection, site not specified: Secondary | ICD-10-CM | POA: Insufficient documentation

## 2019-02-12 DIAGNOSIS — J069 Acute upper respiratory infection, unspecified: Secondary | ICD-10-CM | POA: Diagnosis not present

## 2019-02-12 DIAGNOSIS — J45909 Unspecified asthma, uncomplicated: Secondary | ICD-10-CM | POA: Diagnosis not present

## 2019-02-12 DIAGNOSIS — Z20828 Contact with and (suspected) exposure to other viral communicable diseases: Secondary | ICD-10-CM | POA: Diagnosis not present

## 2019-02-12 DIAGNOSIS — R0602 Shortness of breath: Secondary | ICD-10-CM | POA: Diagnosis not present

## 2019-02-12 DIAGNOSIS — I10 Essential (primary) hypertension: Secondary | ICD-10-CM | POA: Diagnosis not present

## 2019-02-12 DIAGNOSIS — R319 Hematuria, unspecified: Secondary | ICD-10-CM | POA: Insufficient documentation

## 2019-02-12 DIAGNOSIS — Z79899 Other long term (current) drug therapy: Secondary | ICD-10-CM | POA: Diagnosis not present

## 2019-02-12 DIAGNOSIS — B9789 Other viral agents as the cause of diseases classified elsewhere: Secondary | ICD-10-CM | POA: Diagnosis not present

## 2019-02-12 DIAGNOSIS — R05 Cough: Secondary | ICD-10-CM | POA: Diagnosis not present

## 2019-02-12 DIAGNOSIS — R000 Tachycardia, unspecified: Secondary | ICD-10-CM | POA: Diagnosis not present

## 2019-02-12 LAB — CBC WITH DIFFERENTIAL/PLATELET
Abs Immature Granulocytes: 0.02 10*3/uL (ref 0.00–0.07)
Basophils Absolute: 0 10*3/uL (ref 0.0–0.1)
Basophils Relative: 0 %
Eosinophils Absolute: 0 10*3/uL (ref 0.0–0.5)
Eosinophils Relative: 0 %
HCT: 49.7 % (ref 39.0–52.0)
Hemoglobin: 16.9 g/dL (ref 13.0–17.0)
Immature Granulocytes: 0 %
Lymphocytes Relative: 11 %
Lymphs Abs: 0.9 10*3/uL (ref 0.7–4.0)
MCH: 33.3 pg (ref 26.0–34.0)
MCHC: 34 g/dL (ref 30.0–36.0)
MCV: 98 fL (ref 80.0–100.0)
Monocytes Absolute: 0.8 10*3/uL (ref 0.1–1.0)
Monocytes Relative: 9 %
Neutro Abs: 7 10*3/uL (ref 1.7–7.7)
Neutrophils Relative %: 80 %
Platelets: 175 10*3/uL (ref 150–400)
RBC: 5.07 MIL/uL (ref 4.22–5.81)
RDW: 13.2 % (ref 11.5–15.5)
WBC: 8.8 10*3/uL (ref 4.0–10.5)
nRBC: 0 % (ref 0.0–0.2)

## 2019-02-12 LAB — BASIC METABOLIC PANEL
Anion gap: 15 (ref 5–15)
BUN: 15 mg/dL (ref 6–20)
CO2: 18 mmol/L — ABNORMAL LOW (ref 22–32)
Calcium: 9 mg/dL (ref 8.9–10.3)
Chloride: 102 mmol/L (ref 98–111)
Creatinine, Ser: 1.33 mg/dL — ABNORMAL HIGH (ref 0.61–1.24)
GFR calc Af Amer: >60 mL/min (ref >60)
GFR calc non Af Amer: >60 mL/min (ref >60)
Glucose, Bld: 138 mg/dL — ABNORMAL HIGH (ref 70–99)
Potassium: 3.8 mmol/L (ref 3.5–5.1)
Sodium: 135 mmol/L (ref 135–145)

## 2019-02-12 LAB — URINALYSIS, ROUTINE W REFLEX MICROSCOPIC
Glucose, UA: NEGATIVE mg/dL
Ketones, ur: NEGATIVE mg/dL
Leukocytes,Ua: NEGATIVE
Nitrite: NEGATIVE
Protein, ur: 100 mg/dL — AB
Specific Gravity, Urine: >1.03 — ABNORMAL HIGH (ref 1.005–1.030)
pH: 5.5 (ref 5.0–8.0)

## 2019-02-12 LAB — URINALYSIS, MICROSCOPIC (REFLEX): Squamous Epithelial / HPF: NONE SEEN (ref 0–5)

## 2019-02-12 MED ORDER — ONDANSETRON HCL 4 MG/2ML IJ SOLN
INTRAMUSCULAR | Status: AC
  Filled 2019-02-12: qty 2

## 2019-02-12 MED ORDER — LOPERAMIDE HCL 2 MG PO CAPS
4.0000 mg | ORAL_CAPSULE | Freq: Once | ORAL | Status: AC
  Administered 2019-02-12: 4 mg via ORAL
  Filled 2019-02-12: qty 2

## 2019-02-12 MED ORDER — MORPHINE SULFATE (PF) 4 MG/ML IV SOLN
4.0000 mg | Freq: Once | INTRAVENOUS | Status: AC
  Administered 2019-02-12: 4 mg via INTRAVENOUS
  Filled 2019-02-12: qty 1

## 2019-02-12 MED ORDER — LACTATED RINGERS IV BOLUS
1000.0000 mL | Freq: Once | INTRAVENOUS | Status: AC
  Administered 2019-02-12: 1000 mL via INTRAVENOUS

## 2019-02-12 MED ORDER — CEPHALEXIN 500 MG PO CAPS: 500.0000 mg | ORAL_CAPSULE | Freq: Three times a day (TID) | ORAL | 0 refills | Status: DC

## 2019-02-12 MED ORDER — ONDANSETRON 4 MG PO TBDP
4.0000 mg | ORAL_TABLET | Freq: Once | ORAL | Status: DC
  Filled 2019-02-12: qty 1

## 2019-02-12 MED ORDER — SODIUM CHLORIDE 0.9 % IV SOLN
1.0000 g | Freq: Once | INTRAVENOUS | Status: AC
  Administered 2019-02-12: 1 g via INTRAVENOUS
  Filled 2019-02-12: qty 10

## 2019-02-12 MED ORDER — HYDROMORPHONE HCL 1 MG/ML IJ SOLN
0.5000 mg | Freq: Once | INTRAMUSCULAR | Status: AC
  Administered 2019-02-12: 0.5 mg via INTRAVENOUS
  Filled 2019-02-12: qty 1

## 2019-02-12 MED ORDER — ONDANSETRON HCL 4 MG/2ML IJ SOLN
4.0000 mg | Freq: Once | INTRAMUSCULAR | Status: AC
  Administered 2019-02-12: 4 mg via INTRAVENOUS

## 2019-02-12 MED ORDER — ONDANSETRON HCL 4 MG PO TABS: 4.0000 mg | ORAL_TABLET | Freq: Four times a day (QID) | ORAL | 0 refills | Status: DC

## 2019-02-12 MED ORDER — LOPERAMIDE HCL 2 MG PO CAPS: 2.0000 mg | ORAL_CAPSULE | Freq: Four times a day (QID) | ORAL | 0 refills | Status: DC | PRN

## 2019-02-12 NOTE — ED Notes (Signed)
Portable XR done.

## 2019-02-12 NOTE — ED Notes (Signed)
ED Provider at bedside. 

## 2019-02-12 NOTE — ED Triage Notes (Signed)
Pt BIB [facility] for flu like symptoms. Stated he was seen 2 days ago. Has had Cough, body aches, and shortness of breath. States diarrhea, nausea and infrequent urination.

## 2019-02-13 LAB — URINE CULTURE: Culture: NO GROWTH

## 2019-02-13 NOTE — ED Provider Notes (Signed)
[facility] EMERGENCY DEPARTMENT Provider Note   CSN: [id number] Arrival date & time: 02/12/19  6270    History   Chief Complaint Chief Complaint  Patient presents with   Shortness of Breath   Cough   Urinary Retention    HPI Daniel Durham is a 53 y.o. male.     HPI   52yM with multiple complaints, but primarily cough and shortness of breath. He was seen in the ED two days ago for similar symptoms. He feels like they are worsening. Intermittent fever. Body aches. Just overall does not feel well. No urinary complaints.   Past Medical History:  Diagnosis Date   Allergy    Anxiety    Asthma    SEASONAL    Blood transfusion without reported diagnosis    Chronic back pain    Neurostimulator   Depression    Environmental allergies    Hypertension    Neuromuscular disorder ([place])    Neuropathy    Seasonal allergic conjunctivitis     Patient Active Problem List   Diagnosis Date Noted   Colon cancer screening 05/25/2018   Anxiety and depression 03/03/2017   Cervical disc disorder with radiculopathy of cervical region 07/24/2015   Spondylosis of lumbar region without myelopathy or radiculopathy 06/20/2015   Exacerbation of chronic back pain 03/20/2015   Amputation of arm below elbow, 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 EF 45% in RAO, 60% LAO (51% stress, 54% rest by NM stress; 57-59% echo 10/2010)   CARPAL TUNNEL RELEASE     Bilateral   CERVICAL DISCECTOMY  12/17/2017   titanium plates 12-17-17   COLONOSCOPY     HAND AMPUTATION  2007   POLYPECTOMY     pt states had colon in [place] and he had polyps- he has no idea what type    ROTATOR CUFF REPAIR     Left   SPINAL CORD STIMULATOR IMPLANT     SPINAL CORD STIMULATOR REMOVAL N/A 10/26/2015   Procedure: LUMBAR SPINAL CORD STIMULATOR REMOVAL;  Surgeon: Clydell Hakim, MD;  Location: [facility];  Service: Neurosurgery;  Laterality: N/A;        Home Medications    Prior to Admission medications   Medication Sig Start Date End Date Taking? Authorizing Provider  albuterol (PROVENTIL) (2.5 MG/3ML) 0.083% nebulizer solution Take 3 mLs (2.5 mg total) by nebulization every 6 (six) hours as needed for wheezing or shortness of breath. 12/24/18  Yes Brunetta Jeans, PA-C  albuterol (VENTOLIN HFA) 108 (90 Base) MCG/ACT inhaler INHALE 2 PUFFS BY MOUTH EVERY 6 HOURS AS NEEDED FOR WHEEZING Patient taking differently: Inhale 2 puffs into the lungs every 6 (six) hours as needed for wheezing or shortness of breath.  02/15/18  Yes Brunetta Jeans, PA-C  cyclobenzaprine (FLEXERIL) 10 MG tablet Take 1 tablet (10 mg total) by mouth 2 (two) times daily as needed for muscle spasms. 12/06/17  Yes Valere Dross, Alyssa B, PA-C  gabapentin (NEURONTIN) 600 MG tablet Take 600 mg by mouth 3 (three) times daily.   Yes [provider]  lisinopril (PRINIVIL,ZESTRIL) 20 MG tablet TAKE 1  TABLET(20 MG) BY MOUTH DAILY Patient taking differently: Take 20 mg by mouth daily.  01/04/19  Yes Brunetta Jeans, PA-C  Multiple Vitamins-Minerals (HM MULTIVITAMIN ADULT GUMMY) CHEW Chew 2 tablets by mouth daily.    Yes [provider]  oxyCODONE-acetaminophen (PERCOCET) 10-325 MG tablet TK 1 T PO Q 6 H PRN Patient taking differently: Take 1 tablet by mouth every 6 (six) hours as needed for pain.  01/19/19  Yes Brunetta Jeans, PA-C  sertraline (ZOLOFT) 50 MG tablet Take 1 tablet (50 mg total) by mouth daily. 12/24/18  Yes Brunetta Jeans, PA-C  ALPRAZolam (XANAX) 0.25 MG tablet TAKE 1 TABLET(0.25 MG) BY MOUTH TWICE DAILY  AS NEEDED FOR ANXIETY Patient taking differently: Take 0.25 mg by mouth 2 (two) times daily as needed for anxiety.  01/19/19   Brunetta Jeans, PA-C  cephALEXin (KEFLEX) 500 MG capsule Take 1 capsule (500 mg total) by mouth 3 (three) times daily. 02/12/19   Virgel Manifold, MD  fluticasone-salmeterol (ADVAIR HFA) [phone] MCG/ACT inhaler Inhale 2 puffs into the lungs 2 (two) times daily. 01/31/15   Brunetta Jeans, PA-C  hydrOXYzine (ATARAX/VISTARIL) 25 MG tablet Take 1 tablet (25 mg total) by mouth 3 (three) times daily as needed. Patient taking differently: Take 25 mg by mouth 3 (three) times daily as needed for anxiety.  09/23/18   Brunetta Jeans, PA-C  loperamide (IMODIUM) 2 MG capsule Take 1 capsule (2 mg total) by mouth 4 (four) times daily as needed for diarrhea or loose stools. 02/12/19   Virgel Manifold, MD  montelukast (SINGULAIR) 10 MG tablet TAKE 1 TABLET(10 MG) BY MOUTH AT BEDTIME Patient taking differently: Take 10 mg by mouth at bedtime.  09/23/18   Brunetta Jeans, PA-C  ondansetron (ZOFRAN) 4 MG tablet Take 1 tablet (4 mg total) by mouth every 6 (six) hours. 02/12/19   Virgel Manifold, MD  pantoprazole (PROTONIX) 20 MG tablet Take 1 tablet (20 mg total) by mouth daily. 02/16/18   Charlesetta Shanks, MD  predniSONE (DELTASONE) 10 MG tablet Take 6 tablets (60 mg total) by mouth daily for 3 days. 02/10/19 02/13/19  Andee Poles, MD  tiZANidine (ZANAFLEX) 2 MG tablet Take 2 mg by mouth every 8 (eight) hours as needed for muscle spasms.     [provider]    Family History Family History  Problem Relation Age of Onset   Diabetes Mother        Living   Sleep apnea Mother    Diabetes Father 58       Deceased   Hypertension Father    Jaundice Father    Hemophilia Father    Alcoholism Father    Cancer Maternal Grandfather        Throat   Esophageal cancer Maternal Grandfather    Cancer Paternal Uncle        Throat   Esophageal cancer Paternal Uncle     Asthma Sister    Diabetes Sister    Healthy Son        X1   Healthy Daughter        X1   Colon polyps Neg Hx    Colon cancer Neg Hx    Rectal cancer Neg Hx    Stomach cancer Neg Hx     Social History Social History   Tobacco Use   Smoking status: Never Smoker   Smokeless tobacco: Never Used  Substance Use Topics   Alcohol use: Yes    Alcohol/week: 0.0  standard drinks    Comment: OCCAS   Drug use: No     Allergies   Aspirin and Hydrocodone   Review of Systems Review of Systems  All systems reviewed and negative, other than as noted in HPI.  Physical Exam Updated Vital Signs BP 126/81 (BP Location: Right Arm)    Pulse 81    Temp 98.3 F (36.8 C) (Oral)    Resp (!) 21    Ht 5\' 7"  (1.702 m)    Wt 113.4 kg    SpO2 96%    BMI 39.16 kg/m   Physical Exam Vitals signs and nursing note reviewed.  Constitutional:      General: He is not in acute distress.    Appearance: He is well-developed.  HENT:     Head: Normocephalic and atraumatic.  Eyes:     General:        Right eye: No discharge.        Left eye: No discharge.     Conjunctiva/sclera: Conjunctivae normal.  Neck:     Musculoskeletal: Neck supple.  Cardiovascular:     Rate and Rhythm: Normal rate and regular rhythm.     Heart sounds: Normal heart sounds. No murmur. No friction rub. No gallop.   Pulmonary:     Effort: Pulmonary effort is normal. No respiratory distress.     Breath sounds: Normal breath sounds.  Abdominal:     General: There is no distension.     Palpations: Abdomen is soft.     Tenderness: There is no abdominal tenderness.  Musculoskeletal:        General: No tenderness.     Comments: LUE amputation at forearm.   Skin:    General: Skin is warm and dry.  Neurological:     Mental Status: He is alert.  Psychiatric:        Behavior: Behavior normal.        Thought Content: Thought content normal.      ED Treatments / Results  Labs (all labs ordered are listed, but only  abnormal results are displayed) Labs Reviewed  URINALYSIS, ROUTINE W REFLEX MICROSCOPIC - Abnormal; Notable for the following components:      Result Value   Color, Urine AMBER (*)    Specific Gravity, Urine >1.030 (*)    Hgb urine dipstick TRACE (*)    Bilirubin Urine MODERATE (*)    Protein, ur 100 (*)    All other components within normal limits  BASIC METABOLIC PANEL - Abnormal; Notable for the following components:   CO2 18 (*)    Glucose, Bld 138 (*)    Creatinine, Ser 1.33 (*)    All other components within normal limits  URINALYSIS, MICROSCOPIC (REFLEX) - Abnormal; Notable for the following components:   Bacteria, UA MANY (*)    All other components within normal limits  URINE CULTURE  NOVEL CORONAVIRUS, NAA (HOSPITAL ORDER, SEND-OUT TO REF LAB)  CBC WITH DIFFERENTIAL/PLATELET    EKG None  Radiology Dg Chest Portable 1 View  Result Date: 02/12/2019 CLINICAL DATA:  Persistent cough, shortness of breath, chest congestion, fever and body aches. Patient was seen in the [facility] emergency department 2 days ago with these same symptoms. Current history of asthma. EXAM: PORTABLE CHEST 1 VIEW COMPARISON:  02/10/2019 and earlier, including CTA chest 08/15/2018. FINDINGS: Suboptimal inspiration accounts for crowded bronchovascular markings, especially in the bases, and accentuates the cardiac silhouette. Taking this into account, cardiac silhouette upper normal in size to  slightly enlarged, unchanged. Lungs clear. Pulmonary vascularity normal. No visible pleural effusions. IMPRESSION: Suboptimal inspiration. No acute cardiopulmonary disease. Electronically Signed   By: Evangeline Dakin M.D.   On: 02/12/2019 08:10    Procedures Procedures (including critical care time)  Medications Ordered in ED Medications  morphine 4 MG/ML injection 4 mg (4 mg Intravenous Given 02/12/19 0804)  loperamide (IMODIUM) capsule 4 mg (4 mg Oral Given 02/12/19 0804)  lactated ringers bolus 1,000 mL (0  mLs Intravenous Stopped 02/12/19 0931)  ondansetron (ZOFRAN) injection 4 mg (4 mg Intravenous Given 02/12/19 0811)  cefTRIAXone (ROCEPHIN) 1 g in sodium chloride 0.9 % 100 mL IVPB (0 g Intravenous Stopped 02/12/19 1020)  HYDROmorphone (DILAUDID) injection 0.5 mg (0.5 mg Intravenous Given 02/12/19 0926)     Initial Impression / Assessment and Plan / ED Course  I have reviewed the triage vital signs and the nursing notes.  Pertinent labs & imaging results that were available during my care of the patient were reviewed by me and considered in my medical decision making (see chart for details).        52yM with what I suspect is a viral URI. May potentially have UTI although no specific urinary symptoms. Will send urine culture and place on abx. I suspect there may be some anxiety component. Tried to reassure. Return precautions discussed. It has been determined that no acute conditions requiring further emergency intervention are present at this time. The patient has been advised of the diagnosis and plan. I reviewed any labs and imaging including any potential incidental findings. I have reviewed nursing notes and appropriate previous records. We have discussed signs and symptoms that warrant return to the ED and they are listed in the discharge instructions.      Final Clinical Impressions(s) / ED Diagnoses   Final diagnoses:  Viral upper respiratory tract infection  Urinary tract infection with hematuria, site unspecified    ED Discharge Orders         Ordered    cephALEXin (KEFLEX) 500 MG capsule  3 times daily     02/12/19 1034    loperamide (IMODIUM) 2 MG capsule  4 times daily PRN     02/12/19 1034    ondansetron (ZOFRAN) 4 MG tablet  Every 6 hours     02/12/19 1034           Virgel Manifold, MD 02/13/19 1659

## 2019-02-14 ENCOUNTER — Encounter: Payer: Self-pay | Admitting: Physician Assistant

## 2019-02-14 LAB — NOVEL CORONAVIRUS, NAA (HOSP ORDER, SEND-OUT TO REF LAB; TAT 18-24 HRS): SARS-CoV-2, NAA: NOT DETECTED

## 2019-02-15 ENCOUNTER — Encounter: Payer: Self-pay | Admitting: Physician Assistant

## 2019-02-15 ENCOUNTER — Other Ambulatory Visit: Payer: Self-pay

## 2019-02-15 ENCOUNTER — Ambulatory Visit (INDEPENDENT_AMBULATORY_CARE_PROVIDER_SITE_OTHER): Payer: Medicare HMO | Admitting: Physician Assistant

## 2019-02-15 VITALS — Ht 67.0 in | Wt 254.0 lb

## 2019-02-15 DIAGNOSIS — K5900 Constipation, unspecified: Secondary | ICD-10-CM | POA: Diagnosis not present

## 2019-02-15 DIAGNOSIS — N179 Acute kidney failure, unspecified: Secondary | ICD-10-CM | POA: Diagnosis not present

## 2019-02-15 DIAGNOSIS — E860 Dehydration: Secondary | ICD-10-CM

## 2019-02-15 DIAGNOSIS — B349 Viral infection, unspecified: Secondary | ICD-10-CM | POA: Diagnosis not present

## 2019-02-15 MED ORDER — OXYCODONE-ACETAMINOPHEN 10-325 MG PO TABS: ORAL_TABLET | ORAL | 0 refills | Status: DC

## 2019-02-15 MED ORDER — ALPRAZOLAM 0.25 MG PO TABS: 0.2500 mg | ORAL_TABLET | Freq: Two times a day (BID) | ORAL | 0 refills | Status: DC | PRN

## 2019-02-15 MED ORDER — SERTRALINE HCL 50 MG PO TABS: 50.0000 mg | ORAL_TABLET | Freq: Every day | ORAL | 0 refills | Status: DC

## 2019-02-15 NOTE — Progress Notes (Signed)
I have discussed the procedure for the virtual visit with the patient who has given consent to proceed with assessment and treatment.   Jakyle Petrucelli S Kaidynce Pfister, CMA     

## 2019-02-15 NOTE — Patient Instructions (Signed)
Instructions sent to MyChart.   Please keep hydrated and get plenty of rest. Start a stool softener to help with constipation. Continue chronic medications but stop the Keflex as your urine testing was negative.  Monitor your intake and urinary output today and record. Message me later with these reading and again tomorrow morning. If you are unable to urinate, please go to the ER.   We will follow-up in the morning.

## 2019-02-15 NOTE — Progress Notes (Signed)
Virtual Visit via Video Note I connected with Daniel Durham on 02/15/19 at  8:00 AM EDT by a video enabled telemedicine application and verified that I am speaking with the correct person using two identifiers.   I discussed the limitations of evaluation and management by telemedicine and the availability of in person appointments. The patient expressed understanding and agreed to proceed.  History of Present Illness:  Patient presents today via [facility] for ER follow-up. Patient notes 1 week of chest tightness, SOB, dry cough and low-grade fevers. Was evaluated in ER twice, first on 02/10/2019 at [facility]. Workup at that time included labs (stable CBC, BNP within normal limits, negative serial troponin), EKG (sinus rhythm), CXR (negative for acute findings). Patient was given IV fluids, albuterol inhaler, prednisone while in ER. Patient was discharged home on prednisone course for suspected asthma exacerbation along with viral URI. Was not felt to be COVID-related at that time. Patient noted having significant weakness with diarrhea and decreased urinary output for the next 2 days. As such he returned to ER via EMS on 02/12/2019 to [facility] for reassessment. Workup at that time included repeat CXR (Suboptimal Inspiration. Otherwise unremarkable), Labs (elevated creatinine at 1.33, CBC stable, UA with protein and many bacteria), COVID testing (pending during ER stay -- on review is negative). Patient thought to have potential UTI. Was given IM Rocephin and discharged home to complete prior medication courses and a course of Keflex while urine culture pending.   Since discharge patient notes he is feeling better daily. Has completed course of steroids with no residual respiratory symptoms. He denies urinary urgency, frequency, dysuria or flank pain. Does note alternating between loose stools initially but now with constipation. Has not been hydrating well. Notes appetite is returning. Does state that his last BM was this  morning. Denies tenesmus, melena or hematochezia. Notes stool was hard. Still noting low urinary output compared to normal. Last void was a couple of hours ago. Denies hesitancy. Denies dark urine. Of note, urine culture was negative. Denies fever, chills. Some residual fatigue.   Observations/Objective: Patient is well-developed, well-nourished in no acute distress.  Resting comfortably at home.  Head is normocephalic, atraumatic.  No labored breathing.  Speech is clear and coherent with logical contest.  Patient is alert and oriented at baseline.   Assessment and Plan: 1. Viral illness Unspecified. Primarily URI symptoms with reactive airway but some loose stools noted at onset of symptoms. COVID 19 testing negative. He is afebrile and symptoms have resolved. Recommend he continue social distancing and staying at home. Handwashing practices discussed. Masking reviewed. Will continue to monitor for any recurrence of symptoms.   2. Dehydration Is starting to hydrate better but only since this AM. He has had less urinary output which is likely due to dehydration but this should only return to normal from this point. Strict I/O at home. He is to record and message me with I/O log tomorrow morning. If no output over next 6-8 hours, will need to return to ER for assessment.   3. AKI (acute kidney injury) ([place]) Secondary to #2. Is trying to hydrate better. STrict I/O as noted above. Will schedule lab visit for repeat BMP later this week to reassess.  4. Constipation, unspecified constipation type Bowel regimen reviewed. Instructions sent to MyChart.  In this case, hydration will be key. Follow-up if not improving.   Follow Up Instructions: I discussed the assessment and treatment plan with the patient. The patient was provided an opportunity to ask  questions and all were answered. The patient agreed with the plan and demonstrated an understanding of the instructions.   The patient was advised  to call back or seek an in-person evaluation if the symptoms worsen or if the condition fails to improve as anticipated.  Leeanne Rio, PA-C

## 2019-02-21 ENCOUNTER — Other Ambulatory Visit: Payer: Medicare HMO

## 2019-02-25 ENCOUNTER — Other Ambulatory Visit (INDEPENDENT_AMBULATORY_CARE_PROVIDER_SITE_OTHER): Payer: Medicare HMO

## 2019-02-25 DIAGNOSIS — N179 Acute kidney failure, unspecified: Secondary | ICD-10-CM | POA: Diagnosis not present

## 2019-02-25 DIAGNOSIS — R7989 Other specified abnormal findings of blood chemistry: Secondary | ICD-10-CM

## 2019-02-25 LAB — BASIC METABOLIC PANEL
BUN: 10 mg/dL (ref 6–23)
CO2: 26 mEq/L (ref 19–32)
Calcium: 9.1 mg/dL (ref 8.4–10.5)
Chloride: 102 mEq/L (ref 96–112)
Creatinine, Ser: 0.94 mg/dL (ref 0.40–1.50)
GFR: 84.31 mL/min (ref >60.00)
Glucose, Bld: 94 mg/dL (ref 70–99)
Potassium: 4.3 mEq/L (ref 3.5–5.1)
Sodium: 138 mEq/L (ref 135–145)

## 2019-02-25 LAB — TSH: TSH: 1.29 u[IU]/mL (ref 0.35–4.50)

## 2019-03-14 ENCOUNTER — Other Ambulatory Visit: Payer: Self-pay | Admitting: Physician Assistant

## 2019-03-14 ENCOUNTER — Telehealth: Payer: Self-pay | Admitting: Physician Assistant

## 2019-03-14 MED ORDER — OXYCODONE-ACETAMINOPHEN 10-325 MG PO TABS: ORAL_TABLET | ORAL | 0 refills | Status: DC

## 2019-03-14 MED ORDER — ALBUTEROL SULFATE (2.5 MG/3ML) 0.083% IN NEBU: 2.5000 mg | INHALATION_SOLUTION | Freq: Four times a day (QID) | RESPIRATORY_TRACT | 0 refills | Status: DC | PRN

## 2019-03-14 MED ORDER — ALPRAZOLAM 0.25 MG PO TABS: 0.2500 mg | ORAL_TABLET | Freq: Two times a day (BID) | ORAL | 0 refills | Status: DC | PRN

## 2019-03-14 NOTE — Telephone Encounter (Signed)
Copied from [place]. Topic: Quick Communication - Rx Refill/Question >> Mar 14, 2019  2:09 PM Robina Ade, Helene Kelp D wrote: Medication: nebulizer machine  Has the patient contacted their pharmacy? No , need a new machine (Agent: If no, request that the patient contact the pharmacy for the refill.) (Agent: If yes, when and what did the pharmacy advise?)  Preferred Pharmacy (with phone number or street name): [facility], [place]: Please be advised that RX refills may take up to 3 business days. We ask that you follow-up with your pharmacy.

## 2019-03-14 NOTE — Telephone Encounter (Signed)
Last OV 02/15/19 Alprazolam last filled 02/15/19 #30 with 0 Oxycodone last filled 02/15/19 #120 with 0 Albuterol last filled 02/14/19 157mL with 0

## 2019-03-15 NOTE — Telephone Encounter (Signed)
In order for me to write an Rx (for insurance coverage) for a new nebulizer machine, I have to have a visit within 60 days regarding need for new machine -- due to patient having Medicare.

## 2019-03-16 ENCOUNTER — Encounter: Payer: Self-pay | Admitting: Physician Assistant

## 2019-03-16 ENCOUNTER — Ambulatory Visit (INDEPENDENT_AMBULATORY_CARE_PROVIDER_SITE_OTHER): Payer: Medicare HMO | Admitting: Physician Assistant

## 2019-03-16 ENCOUNTER — Other Ambulatory Visit: Payer: Self-pay

## 2019-03-16 VITALS — Ht 67.0 in | Wt 250.0 lb

## 2019-03-16 DIAGNOSIS — Z7689 Persons encountering health services in other specified circumstances: Secondary | ICD-10-CM | POA: Diagnosis not present

## 2019-03-16 DIAGNOSIS — T879 Unspecified complications of amputation stump: Secondary | ICD-10-CM

## 2019-03-16 DIAGNOSIS — M47816 Spondylosis without myelopathy or radiculopathy, lumbar region: Secondary | ICD-10-CM

## 2019-03-16 DIAGNOSIS — G8929 Other chronic pain: Secondary | ICD-10-CM

## 2019-03-16 DIAGNOSIS — M549 Dorsalgia, unspecified: Secondary | ICD-10-CM | POA: Diagnosis not present

## 2019-03-16 DIAGNOSIS — J4530 Mild persistent asthma, uncomplicated: Secondary | ICD-10-CM | POA: Diagnosis not present

## 2019-03-16 MED ORDER — METHYLPREDNISOLONE 4 MG PO TBPK: ORAL_TABLET | ORAL | 0 refills | Status: DC

## 2019-03-16 NOTE — Progress Notes (Signed)
Virtual Visit via Video   I connected with patient on 03/16/19 at 10:20 AM EDT by a video enabled telemedicine application and verified that I am speaking with the correct person using two identifiers.  Location patient: Home Location provider: [facility], Office Persons participating in the virtual visit: Patient, Provider, [place] (Patina Moore)  I discussed the limitations of evaluation and management by telemedicine and the availability of in person appointments. The patient expressed understanding and agreed to proceed.  Subjective:   HPI:  Patient presents today via [facility]Me for assessment of DME items and to discuss exacerbation of chronic back pain.   Patient with history of mild, persistent asthma. Is in need of a new nebulizer machine as his is several years old and does not work correctly. Has script for albuterol neb solution and Advair. Not currently on Advair due to cost. Notes having to use albuterol a few times per week. Noting daily symptoms of chest tightness here in the past 3-4 weeks. Denies nighttime symptoms.   Patient also requesting a script for a power wheelchair/scooter. This was done last year but due to some unforseen events he was not able to pick up. Patient has significant issue with mobility 2/2 chronic lumbar spondylosis, cervical stenosis, limb amputation (left arm). Would be unable to maneuver a manual wheelchair on his own. Requires more assistance for longer "ambulation".  Patient notes 1 week of increased back pain, mostly lumbar without new onset radiation.  Notes this pain is about 8/10 despite taking his chronic pain medication and muscle relaxants. Denies change in bowel or bladder habits. Denies saddle anesthesia or new onset weakness. Cannot think of an inciting event. Pain is severely affecting sleep at present.   ROS:   See pertinent positives and negatives per HPI.  Patient Active Problem List   Diagnosis Date Noted  . Colon cancer  screening 05/25/2018  . Anxiety and depression 03/03/2017  . Cervical disc disorder with radiculopathy of cervical region 07/24/2015  . 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 Exacerbation of chronic back pain 03/20/2015  . 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 Visit for preventive health examination 02/04/2015  . Chronic back pain greater than 3 months duration 01/23/2015  . 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 Smoking status: Never Smoker  . Smokeless tobacco: Never Used  Substance Use Topics  . Alcohol use: Yes    Alcohol/week: 0.0 standard drinks    Comment: OCCAS    Current Outpatient Medications:  .  albuterol (PROVENTIL) (2.5 MG/3ML) 0.083% nebulizer solution, Take 3 mLs (2.5 mg total) by nebulization every 6 (six) hours as needed for wheezing or shortness of breath., Disp: 180 mL, Rfl: 0 .  albuterol (VENTOLIN HFA) 108 (90 Base) MCG/ACT inhaler, INHALE 2 PUFFS BY MOUTH EVERY 6 HOURS AS NEEDED FOR WHEEZING (Patient taking differently: Inhale 2 puffs into the lungs every 6 (six) hours as needed for wheezing or shortness of breath. ), Disp: 18 g, Rfl: 3 .  ALPRAZolam (XANAX) 0.25 MG tablet, Take 1 tablet (0.25 mg total) by mouth 2 (two) times daily as needed for anxiety., Disp: 30 tablet, Rfl: 0 .  cyclobenzaprine (FLEXERIL) 10 MG tablet, Take 1 tablet (10 mg total) by mouth 2 (two) times daily as needed for muscle spasms., Disp: 14 tablet, Rfl: 0 .  fluticasone-salmeterol (ADVAIR HFA) 230-21 MCG/ACT inhaler,  Inhale 2 puffs into the lungs 2 (two) times daily., Disp: 3 Inhaler, Rfl: 1 .  gabapentin (NEURONTIN) 600 MG tablet, Take 600 mg by mouth 3 (three) times daily., Disp: , Rfl:  .  hydrOXYzine (ATARAX/VISTARIL) 25 MG tablet, Take 1 tablet (25 mg total) by mouth 3 (three) times daily as needed. (Patient taking differently:  Take 25 mg by mouth 3 (three) times daily as needed for anxiety. ), Disp: 30 tablet, Rfl: 0 .  lisinopril (PRINIVIL,ZESTRIL) 20 MG tablet, TAKE 1 TABLET(20 MG) BY MOUTH DAILY (Patient taking differently: Take 20 mg by mouth daily. ), Disp: 90 tablet, Rfl: 1 .  loperamide (IMODIUM) 2 MG capsule, Take 1 capsule (2 mg total) by mouth 4 (four) times daily as needed for diarrhea or loose stools., Disp: 12 capsule, Rfl: 0 .  montelukast (SINGULAIR) 10 MG tablet, TAKE 1 TABLET(10 MG) BY MOUTH AT BEDTIME (Patient taking differently: Take 10 mg by mouth at bedtime. ), Disp: 90 tablet, Rfl: 1 .  Multiple Vitamins-Minerals (HM MULTIVITAMIN ADULT GUMMY) CHEW, Chew 2 tablets by mouth daily. , Disp: , Rfl:  .  ondansetron (ZOFRAN) 4 MG tablet, Take 1 tablet (4 mg total) by mouth every 6 (six) hours., Disp: 12 tablet, Rfl: 0 .  oxyCODONE-acetaminophen (PERCOCET) 10-325 MG tablet, TK 1 T PO Q 6 H PRN, Disp: 120 tablet, Rfl: 0 .  pantoprazole (PROTONIX) 20 MG tablet, Take 1 tablet (20 mg total) by mouth daily., Disp: 30 tablet, Rfl: 0 .  sertraline (ZOLOFT) 50 MG tablet, Take 1 tablet (50 mg total) by mouth daily., Disp: 90 tablet, Rfl: 0 .  tiZANidine (ZANAFLEX) 2 MG tablet, Take 2 mg by mouth every 8 (eight) hours as needed for muscle spasms. , Disp: , Rfl: 1  Allergies  Allergen Reactions  . Aspirin Swelling  . Hydrocodone Itching    Objective:   Ht 5' 7"  (1.702 m)   Wt 250 lb (113.4 kg)   BMI 39.16 kg/m   Patient is well-developed, well-nourished in no acute distress.  Resting at home.  Head is normocephalic, atraumatic.  No labored breathing.  Speech is clear and coherent with logical content.  Patient is alert and oriented at baseline.   Assessment and Plan:   1. Exacerbation of chronic back pain Continue current regimen. Will start Medrol dose pack. Heating pad as directed. Follow-up in office if not calming down. Also follow-up with spine specialist if needed.  2. Mild persistent asthma  without complication Refills of meds sent in. Will try to find a better alternative (cost) to the Advair he was on previously. Order for nebulizer machine sent to [place].  - DME Nebulizer machine  3. Encounter for power mobility device assessment 4. Spondylosis of lumbar region without myelopathy or radiculopathy 5. Amputation stump complication [facility]) Order for Power Wheelchair written and to be faxed to [facility] along with today's noted. He has needs that cannot be met by cane, walker, manual wheelchair (regular or lightweight).     Leeanne Rio, PA-C 03/16/2019

## 2019-03-16 NOTE — Progress Notes (Signed)
I have discussed the procedure for the virtual visit with the patient who has given consent to proceed with assessment and treatment.   Jessica L Brodmerkel, CMA     

## 2019-03-16 NOTE — Telephone Encounter (Signed)
LMOVM asking patient to schedule appt

## 2019-03-22 ENCOUNTER — Encounter: Payer: Medicare HMO | Admitting: Physician Assistant

## 2019-03-22 DIAGNOSIS — J4530 Mild persistent asthma, uncomplicated: Secondary | ICD-10-CM | POA: Diagnosis not present

## 2019-04-12 ENCOUNTER — Other Ambulatory Visit: Payer: Self-pay | Admitting: Physician Assistant

## 2019-04-12 DIAGNOSIS — F419 Anxiety disorder, unspecified: Secondary | ICD-10-CM

## 2019-04-12 DIAGNOSIS — M549 Dorsalgia, unspecified: Secondary | ICD-10-CM

## 2019-04-12 DIAGNOSIS — J4530 Mild persistent asthma, uncomplicated: Secondary | ICD-10-CM

## 2019-04-12 DIAGNOSIS — F329 Major depressive disorder, single episode, unspecified: Secondary | ICD-10-CM

## 2019-04-12 DIAGNOSIS — F32A Depression, unspecified: Secondary | ICD-10-CM

## 2019-04-12 DIAGNOSIS — M5010 Cervical disc disorder with radiculopathy, unspecified cervical region: Secondary | ICD-10-CM

## 2019-04-12 DIAGNOSIS — G8929 Other chronic pain: Secondary | ICD-10-CM

## 2019-04-12 MED ORDER — MONTELUKAST SODIUM 10 MG PO TABS: ORAL_TABLET | ORAL | 1 refills | Status: DC

## 2019-04-12 MED ORDER — OXYCODONE-ACETAMINOPHEN 10-325 MG PO TABS: ORAL_TABLET | ORAL | 0 refills | Status: DC

## 2019-04-12 MED ORDER — ALPRAZOLAM 0.25 MG PO TABS: 0.2500 mg | ORAL_TABLET | Freq: Two times a day (BID) | ORAL | 0 refills | Status: DC | PRN

## 2019-04-12 NOTE — Addendum Note (Signed)
Addended by: Leonidas Romberg on: 04/12/2019 11:02 AM   Modules accepted: Orders

## 2019-04-12 NOTE — Telephone Encounter (Signed)
Xanax last rx 03/14/19 #30 CSC: 05/25/18 UDs: 12/24/18  LOV: 03/16/19  Indication for chronic opioid: Chronic back pain, Cervical disc disorder with radiculopathy Medication and dose: Percocet 10/325 mg # pills per month: #120 on 03/14/19 Last UDS date: 12/24/18 Opioid Treatment Agreement signed (Y/N): Yes 09/28/18 Opioid Treatment Agreement last reviewed with patient:   NCCSRS reviewed this encounter (include red flags):

## 2019-04-12 NOTE — Telephone Encounter (Signed)
Pt states that he needs refills on montelukast, oxycocodone, and xanax to [facility] in [place]. Pt states that he will be traveling 7/9-8/10 and if needing and OV he would schedule one.

## 2019-04-22 DIAGNOSIS — J4530 Mild persistent asthma, uncomplicated: Secondary | ICD-10-CM | POA: Diagnosis not present

## 2019-04-22 DIAGNOSIS — M549 Dorsalgia, unspecified: Secondary | ICD-10-CM | POA: Diagnosis not present

## 2019-04-22 DIAGNOSIS — Z7689 Persons encountering health services in other specified circumstances: Secondary | ICD-10-CM | POA: Diagnosis not present

## 2019-04-22 DIAGNOSIS — M47816 Spondylosis without myelopathy or radiculopathy, lumbar region: Secondary | ICD-10-CM | POA: Diagnosis not present

## 2019-04-22 DIAGNOSIS — T879 Unspecified complications of amputation stump: Secondary | ICD-10-CM | POA: Diagnosis not present

## 2019-05-09 ENCOUNTER — Telehealth: Payer: Self-pay | Admitting: *Deleted

## 2019-05-09 DIAGNOSIS — F329 Major depressive disorder, single episode, unspecified: Secondary | ICD-10-CM

## 2019-05-09 DIAGNOSIS — F32A Depression, unspecified: Secondary | ICD-10-CM

## 2019-05-09 DIAGNOSIS — M5010 Cervical disc disorder with radiculopathy, unspecified cervical region: Secondary | ICD-10-CM

## 2019-05-09 DIAGNOSIS — M549 Dorsalgia, unspecified: Secondary | ICD-10-CM

## 2019-05-09 DIAGNOSIS — G8929 Other chronic pain: Secondary | ICD-10-CM

## 2019-05-09 DIAGNOSIS — F419 Anxiety disorder, unspecified: Secondary | ICD-10-CM

## 2019-05-09 MED ORDER — OXYCODONE-ACETAMINOPHEN 10-325 MG PO TABS: ORAL_TABLET | ORAL | 0 refills | Status: DC

## 2019-05-09 MED ORDER — ALPRAZOLAM 0.25 MG PO TABS: 0.2500 mg | ORAL_TABLET | Freq: Two times a day (BID) | ORAL | 0 refills | Status: DC | PRN

## 2019-05-09 NOTE — Telephone Encounter (Signed)
Last Fill: Xanax - 04/12/2019 #30, 0 Oxy - 04/12/2019 #120, 0  Last OV: 03/16/2019   Patient states he is requesting refill a couple days early because he is going out of town and would like to pick it up to take with him.

## 2019-05-09 NOTE — Telephone Encounter (Signed)
Patient says pharmacy needs to get a verbal to fill oxy and xanax early although called in today.

## 2019-05-10 NOTE — Telephone Encounter (Signed)
Provider sent over RX electronically.   Since they also need a verbal, I will call pharmacy once they open at 9am to give the verbal okay.  I have spoken with patient to let him know this.

## 2019-05-10 NOTE — Telephone Encounter (Signed)
Verbal approval has been given to pharmacy.

## 2019-05-22 DIAGNOSIS — J4530 Mild persistent asthma, uncomplicated: Secondary | ICD-10-CM | POA: Diagnosis not present

## 2019-05-27 ENCOUNTER — Other Ambulatory Visit: Payer: Self-pay | Admitting: Physician Assistant

## 2019-05-27 ENCOUNTER — Other Ambulatory Visit: Payer: Self-pay | Admitting: *Deleted

## 2019-05-27 DIAGNOSIS — F32A Depression, unspecified: Secondary | ICD-10-CM

## 2019-05-27 DIAGNOSIS — F329 Major depressive disorder, single episode, unspecified: Secondary | ICD-10-CM

## 2019-05-27 MED ORDER — ALBUTEROL SULFATE HFA 108 (90 BASE) MCG/ACT IN AERS: INHALATION_SPRAY | RESPIRATORY_TRACT | 3 refills | Status: DC

## 2019-05-27 NOTE — Telephone Encounter (Signed)
Xanax last rx 05/09/19 #30 LOV: 03/16/19 CSC: 05/25/18 UDS: 12/24/18  Please advise

## 2019-06-06 ENCOUNTER — Other Ambulatory Visit: Payer: Self-pay | Admitting: Physician Assistant

## 2019-06-06 DIAGNOSIS — F329 Major depressive disorder, single episode, unspecified: Secondary | ICD-10-CM

## 2019-06-06 DIAGNOSIS — F32A Depression, unspecified: Secondary | ICD-10-CM

## 2019-06-06 DIAGNOSIS — M5010 Cervical disc disorder with radiculopathy, unspecified cervical region: Secondary | ICD-10-CM

## 2019-06-06 DIAGNOSIS — F419 Anxiety disorder, unspecified: Secondary | ICD-10-CM

## 2019-06-06 MED ORDER — GABAPENTIN 600 MG PO TABS: 600.0000 mg | ORAL_TABLET | Freq: Three times a day (TID) | ORAL | 1 refills | Status: DC

## 2019-06-06 MED ORDER — ALPRAZOLAM 0.25 MG PO TABS: 0.2500 mg | ORAL_TABLET | Freq: Two times a day (BID) | ORAL | 0 refills | Status: DC | PRN

## 2019-06-06 MED ORDER — SERTRALINE HCL 50 MG PO TABS: 50.0000 mg | ORAL_TABLET | Freq: Every day | ORAL | 0 refills | Status: DC

## 2019-06-06 NOTE — Telephone Encounter (Signed)
Xanax last rx 05/09/19 #30 LOV: 03/16/19 CSC: 05/25/18 UDS: 12/24/18  Please advise

## 2019-06-06 NOTE — Telephone Encounter (Signed)
Pt would also like a refill on the Gabapentin.

## 2019-06-08 ENCOUNTER — Ambulatory Visit (INDEPENDENT_AMBULATORY_CARE_PROVIDER_SITE_OTHER): Payer: Medicare HMO | Admitting: Physician Assistant

## 2019-06-08 ENCOUNTER — Other Ambulatory Visit: Payer: Self-pay

## 2019-06-08 ENCOUNTER — Encounter: Payer: Self-pay | Admitting: Physician Assistant

## 2019-06-08 DIAGNOSIS — G8929 Other chronic pain: Secondary | ICD-10-CM

## 2019-06-08 DIAGNOSIS — M5010 Cervical disc disorder with radiculopathy, unspecified cervical region: Secondary | ICD-10-CM

## 2019-06-08 DIAGNOSIS — M549 Dorsalgia, unspecified: Secondary | ICD-10-CM

## 2019-06-08 DIAGNOSIS — M47816 Spondylosis without myelopathy or radiculopathy, lumbar region: Secondary | ICD-10-CM | POA: Diagnosis not present

## 2019-06-08 MED ORDER — OXYCODONE-ACETAMINOPHEN 10-325 MG PO TABS: ORAL_TABLET | ORAL | 0 refills | Status: DC

## 2019-06-08 NOTE — Progress Notes (Signed)
Virtual Visit via Video   I connected with patient on 06/08/19 at  9:00 AM EDT by a video enabled telemedicine application and verified that I am speaking with the correct person using two identifiers.  Location patient: Home Location provider: [facility], Office Persons participating in the virtual visit: Patient, Provider, [place] (Patina Moore)  I discussed the limitations of evaluation and management by telemedicine and the availability of in person appointments. The patient expressed understanding and agreed to proceed.  Subjective:   HPI:   Reviewed and updated  today    Indication for chronic opioid: Chronic back pain, Spinal Stenosis, Chronic OA Medication and dose: Hydrocodone 10-325 mg # pills per month: 120 Last UDS date: 09/2018 Pain contract signed (date): 09/2018 Date narcotic database last reviewed (include red flags): Today. No red flags.  Pain Inventory (1-10 worse): Average Pain 5-6/10 before medication Pain Right Now 4/10 My pain is aching and dull (character i.e. sharp, stabbing, dull, constant etc)  Pain is worse with: activity Relief from Meds: great relief currently  In the last 24 hours, has pain interfered with the following (1-10 greatest interference) ? General activity 5 Relation with others 1 Enjoyment of life 1 What TIME of day is your pain at its worst? evening       Sleep (in general) fair  Mobility/Function: Assistance device: cane How many minutes can you walk? 15-20 Ability to climb steps?  present but challenging Do you drive? yes  Neuro/Psych Sx: (bladder, bowel, weakness, dizziness, depression etc) Depression -- well-managed. Prior Studies: See EMR Physicians involved in your care: Any changes since last visit?  No change since last visit          ROS:   See pertinent positives and negatives per HPI.  Patient Active Problem List   Diagnosis Date Noted  . Colon cancer screening 05/25/2018  . Anxiety and  depression 03/03/2017  . Cervical disc disorder with radiculopathy of cervical region 07/24/2015  . 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 Visit for preventive health examination 02/04/2015  . Chronic back pain greater than 3 months duration 01/23/2015  . 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 Amputation stump complication ([place]) 76/28/3151    Social History   Tobacco Use  . Smoking status: Never Smoker  . Smokeless tobacco: Never Used  Substance Use Topics  . Alcohol use: Yes    Alcohol/week: 0.0 standard drinks    Comment: OCCAS    Current Outpatient Medications:  .  albuterol (PROVENTIL) (2.5 MG/3ML) 0.083% nebulizer solution, Take 3 mLs (2.5 mg total) by nebulization every 6 (six) hours as needed for wheezing or shortness of breath., Disp: 180 mL, Rfl: 0 .  albuterol (VENTOLIN HFA) 108 (90 Base) MCG/ACT inhaler, INHALE 2 PUFFS BY MOUTH EVERY 6 HOURS AS NEEDED FOR WHEEZING, Disp: 18 g, Rfl: 3 .  ALPRAZolam (XANAX) 0.25 MG tablet, Take 1 tablet (0.25 mg total) by mouth 2 (two) times daily as needed for anxiety., Disp: 30 tablet, Rfl: 0 .  gabapentin (NEURONTIN) 600 MG tablet, Take 1 tablet (600 mg total) by mouth 3 (three) times daily., Disp: 270 tablet, Rfl: 1 .  hydrOXYzine (ATARAX/VISTARIL) 25 MG tablet, Take 1 tablet (25 mg total) by mouth 3 (three) times daily as needed. (Patient taking differently: Take 25 mg by mouth 3 (three) times daily as needed for anxiety. ), Disp: 30 tablet, Rfl: 0 .  lisinopril (  ZESTRIL) 20 MG tablet, TAKE 1 TABLET(20 MG) BY MOUTH DAILY, Disp: 90 tablet, Rfl: 1 .  montelukast (SINGULAIR) 10 MG tablet, TAKE 1 TABLET(10 MG) BY MOUTH AT BEDTIME, Disp: 90 tablet, Rfl: 1 .  Multiple Vitamins-Minerals (HM MULTIVITAMIN ADULT GUMMY) CHEW, Chew 2 tablets by mouth daily. , Disp: , Rfl:  .  oxyCODONE-acetaminophen (PERCOCET) 10-325 MG tablet, TK 1 T PO Q 6  H PRN, Disp: 120 tablet, Rfl: 0 .  sertraline (ZOLOFT) 50 MG tablet, Take 1 tablet (50 mg total) by mouth daily., Disp: 90 tablet, Rfl: 0 .  tiZANidine (ZANAFLEX) 2 MG tablet, Take 2 mg by mouth every 8 (eight) hours as needed for muscle spasms. , Disp: , Rfl: 1  Allergies  Allergen Reactions  . Aspirin Swelling  . Hydrocodone Itching    Objective:   There were no vitals taken for this visit.  Patient is well-developed, well-nourished in no acute distress.  Resting comfortably at home.  Head is normocephalic, atraumatic.  No labored breathing.  Speech is clear and coherent with logical contest.  Patient is alert and oriented at baseline.   Assessment and Plan:   1. Encounter for chronic pain management 2. Spondylosis of lumbar region without myelopathy or radiculopathy 3. Cervical disc disorder with radiculopathy of cervical region UDS UTD. CSC UTD. PDMP reviewed today. No red flags. Stable. Medications refilled. Follow-up in-office in 3 months. RTC sooner if needed.    Leeanne Rio, PA-C 06/08/2019

## 2019-06-08 NOTE — Progress Notes (Signed)
I have discussed the procedure for the virtual visit with the patient who has given consent to proceed with assessment and treatment.   Daniel Durham S Marita Burnsed, CMA     

## 2019-06-10 ENCOUNTER — Telehealth: Payer: Self-pay | Admitting: *Deleted

## 2019-06-10 NOTE — Telephone Encounter (Signed)
Patient Daniel Durham on VM up front that he needed a call for questions about a DMV letter he received - states that it is asking medication questions and he wants to ask about it.

## 2019-06-13 NOTE — Telephone Encounter (Signed)
He received a letter from [facility] stating that due to him taking so many controlled medications, they want him to take a road test, how much he drives at night (he doesn't drive much at night), during the daytime he does drive but his wife does all the driving. If he knows he will be driving for long periods of time he does not take his pain medication. Letter from November. His letter states the medications he is taking medication putting him at higher risk when driving.   On Wed he is going to complete a driving test. He has concerns of listing all his medications if the DMV will take away his license.

## 2019-06-14 NOTE — Telephone Encounter (Signed)
As long as he is taking prescribed medications as directed, and not taking when driving (which he has always been very compliant with), then he should be fine as long as he does well with his road test.

## 2019-06-22 DIAGNOSIS — J4530 Mild persistent asthma, uncomplicated: Secondary | ICD-10-CM | POA: Diagnosis not present

## 2019-06-23 ENCOUNTER — Encounter: Payer: Self-pay | Admitting: Gastroenterology

## 2019-06-25 ENCOUNTER — Encounter: Payer: Self-pay | Admitting: Gastroenterology

## 2019-07-01 ENCOUNTER — Telehealth: Payer: Self-pay | Admitting: *Deleted

## 2019-07-01 NOTE — Telephone Encounter (Signed)
Patient called in, LM on VM at front desk. because he states that he has not heard anything regarding the scooter that [place] talked with him about.   He is also stating that he received forms from the [facility] and was told physician has to fill it out or they will pull his Driver's License.  He is asking if [name] is willing to fill out the paper

## 2019-07-01 NOTE — Telephone Encounter (Signed)
Paperwork in your folder for review/completion 

## 2019-07-04 NOTE — Telephone Encounter (Signed)
In terms of his automatic scooter, everything was done at time of appointment at the beginning of the year. Can we reach out to [facility] to see what the issue is?

## 2019-07-05 ENCOUNTER — Ambulatory Visit (INDEPENDENT_AMBULATORY_CARE_PROVIDER_SITE_OTHER): Payer: Medicare HMO | Admitting: Physician Assistant

## 2019-07-05 ENCOUNTER — Other Ambulatory Visit: Payer: Self-pay | Admitting: Physician Assistant

## 2019-07-05 ENCOUNTER — Encounter: Payer: Self-pay | Admitting: Physician Assistant

## 2019-07-05 DIAGNOSIS — Z741 Need for assistance with personal care: Secondary | ICD-10-CM

## 2019-07-05 DIAGNOSIS — M5010 Cervical disc disorder with radiculopathy, unspecified cervical region: Secondary | ICD-10-CM

## 2019-07-05 DIAGNOSIS — G8929 Other chronic pain: Secondary | ICD-10-CM

## 2019-07-05 DIAGNOSIS — F32A Depression, unspecified: Secondary | ICD-10-CM

## 2019-07-05 DIAGNOSIS — F419 Anxiety disorder, unspecified: Secondary | ICD-10-CM

## 2019-07-05 DIAGNOSIS — F329 Major depressive disorder, single episode, unspecified: Secondary | ICD-10-CM

## 2019-07-05 NOTE — Telephone Encounter (Signed)
[place] now [place]. Left a message on voicemail about the status of automatic scooter. The order was placed on 03/16/19. Patient has not heard anything about the scooter.  Also called Andria Rhein listed on the order form for the scooter.

## 2019-07-05 NOTE — Progress Notes (Signed)
Virtual Visit via Video   I connected with patient on 07/05/19 at  4:15 PM EDT by a video enabled telemedicine application and verified that I am speaking with the correct person using two identifiers.  Location patient: Home Location provider: [facility], Office Persons participating in the virtual visit: Patient, Provider, [place] (Patina Moore)  I discussed the limitations of evaluation and management by telemedicine and the availability of in person appointments. The patient expressed understanding and agreed to proceed.  Subjective:   HPI:   Patient presents via [employer] today to update mobility evaluation. This was performed in May at which time order was sent to [place]. They did not process the order at that time and when patient contacted them regarding it they did endorse receiving everything needed but since it has been over 60 days they will not be able to get him the scooter until he has an updated mobility evaluation. 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  Patient uses a cane for stabilization to help with short distance ambulation but still with significant issue.  Is in need of assistance with longer ambulation.  Patient has needs that cannot be met with her regular or light weight wheelchair due to level of pain and absence of limb.  Motorized scooter recommended to accommodate patient needs.  ROS:   See pertinent positives and negatives per HPI.  Patient Active Problem List   Diagnosis Date Noted  . Colon cancer screening 05/25/2018  . Anxiety and depression 03/03/2017  . Cervical disc disorder with radiculopathy of cervical region 07/24/2015  . 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 Amputation of arm  below elbow, left ([place]) 02/04/2015  . Visit for preventive health examination 02/04/2015  . Chronic back pain greater than 3 months duration 01/23/2015  . 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 Amputation stump complication ([place]) 31/54/0086    Social History   Tobacco Use  . Smoking status: Never Smoker  . Smokeless tobacco: Never Used  Substance Use Topics  . Alcohol use: Yes    Alcohol/week: 0.0 standard drinks    Comment: OCCAS    Current Outpatient Medications:  .  albuterol (PROVENTIL) (2.5 MG/3ML) 0.083% nebulizer solution, Take 3 mLs (2.5 mg total) by nebulization every 6 (six) hours as needed for wheezing or shortness of breath., Disp: 180 mL, Rfl: 0 .  albuterol (VENTOLIN HFA) 108 (90 Base) MCG/ACT inhaler, INHALE 2 PUFFS BY MOUTH EVERY 6 HOURS AS NEEDED FOR WHEEZING, Disp: 18 g, Rfl: 3 .  ALPRAZolam (XANAX) 0.25 MG tablet, Take 1 tablet (0.25 mg total) by mouth 2 (two) times daily as needed for anxiety., Disp: 30 tablet, Rfl: 0 .  gabapentin (NEURONTIN) 600 MG tablet, Take 1 tablet (600 mg total) by mouth 3 (three) times daily., Disp: 270 tablet, Rfl: 1 .  hydrOXYzine (ATARAX/VISTARIL) 25 MG tablet, Take 1 tablet (25 mg total) by mouth 3 (three) times daily as needed. (Patient taking differently: Take 25 mg by mouth 3 (three) times daily as needed for anxiety. ), Disp: 30 tablet, Rfl: 0 .  lisinopril (ZESTRIL) 20 MG tablet, TAKE 1 TABLET(20 MG) BY MOUTH DAILY, Disp: 90 tablet, Rfl: 1 .  montelukast (SINGULAIR) 10 MG tablet, TAKE 1 TABLET(10 MG) BY MOUTH AT BEDTIME, Disp: 90 tablet, Rfl: 1 .  Multiple Vitamins-Minerals (HM MULTIVITAMIN ADULT GUMMY) CHEW, Chew 2 tablets by mouth daily. , Disp: , Rfl:  .  oxyCODONE-acetaminophen (PERCOCET) 10-325 MG tablet, TK 1 T PO Q 6 H PRN, Disp: 120 tablet, Rfl: 0 .  sertraline (ZOLOFT) 50 MG tablet, Take 1 tablet (50 mg total) by mouth daily., Disp: 90 tablet, Rfl: 0 .  tiZANidine (ZANAFLEX) 2 MG tablet, Take 2 mg by mouth  every 8 (eight) hours as needed for muscle spasms. , Disp: , Rfl: 1  Allergies  Allergen Reactions  . Aspirin Swelling  . Hydrocodone Itching    Objective:   There were no vitals taken for this visit.  Patient is well-developed, well-nourished in no acute distress.  Resting comfortably at home.  Head is normocephalic, atraumatic.  No labored breathing.  Speech is clear and coherent with logical content.  Patient is alert and oriented at baseline.  Patient with history of good proximal UE and LE strength. Absence of L forearm impacts ability to utilize proper assistance devices.  Assessment and Plan:   1. Assistance needed for mobility Power scooter as best option for patient giving amputation and other chronic medical history. Patient has the physical and mental ability to operate scooter inside and outside the home. This will allow him to more independently perform ADLs such as cooking, bathing, etc. A form has been completed to obtain the power scooter. Will fax into [facility] with this note.    Leeanne Rio, PA-C 07/05/2019

## 2019-07-05 NOTE — Progress Notes (Signed)
I have discussed the procedure for the virtual visit with the patient who has given consent to proceed with assessment and treatment.   Daniel Durham S Fradel Baldonado, CMA     

## 2019-07-06 MED ORDER — ALBUTEROL SULFATE (2.5 MG/3ML) 0.083% IN NEBU: 2.5000 mg | INHALATION_SOLUTION | Freq: Four times a day (QID) | RESPIRATORY_TRACT | 0 refills | Status: DC | PRN

## 2019-07-06 MED ORDER — ALPRAZOLAM 0.25 MG PO TABS: 0.2500 mg | ORAL_TABLET | Freq: Two times a day (BID) | ORAL | 0 refills | Status: DC | PRN

## 2019-07-06 MED ORDER — OXYCODONE-ACETAMINOPHEN 10-325 MG PO TABS: ORAL_TABLET | ORAL | 0 refills | Status: DC

## 2019-07-06 NOTE — Telephone Encounter (Signed)
Last OV 06/08/19 Oxycodone last filled 06/08/19 #120 with 0

## 2019-07-06 NOTE — Telephone Encounter (Signed)
Spoke with Andria Rhein at [facility]. Since it has been since May of last completion of documentation for power scooter. Will need to update last OV and resend the notes to [place].

## 2019-07-06 NOTE — Telephone Encounter (Signed)
Last OV 07/05/19 Alprazolam last filled 06/06/19 #30 with 0

## 2019-07-07 ENCOUNTER — Encounter: Payer: Self-pay | Admitting: General Practice

## 2019-07-07 ENCOUNTER — Telehealth: Payer: Self-pay | Admitting: *Deleted

## 2019-07-07 MED ORDER — SKLICE 0.5 % EX LOTN: TOPICAL_LOTION | CUTANEOUS | 0 refills | Status: DC

## 2019-07-07 NOTE — Telephone Encounter (Signed)
Prescription sent for [facility].  Not sure if this will be covered by insurance.  May be cheaper to get OTC treatment.  Will also need to wash all bedding, towels, and treat all household members.

## 2019-07-07 NOTE — Addendum Note (Signed)
Addended by: Midge Minium on: 07/07/2019 04:58 PM   Modules accepted: Orders

## 2019-07-07 NOTE — Telephone Encounter (Signed)
Called and left a detailed message for pt and also sent a mychart.

## 2019-07-07 NOTE — Telephone Encounter (Signed)
Patient called in and said that he has his granddaughter in the [place]. He states that he thinks she has head lice - he thinks he has it now and is asking if there is anything that can be called in for him to pick up vs paying for something over the counter.   He is asking for an answer today so that he can figure something out.  Routing to supervising PCP since patient's PCP is not here this afternoon.

## 2019-07-08 ENCOUNTER — Telehealth: Payer: Self-pay | Admitting: Physician Assistant

## 2019-07-08 NOTE — Telephone Encounter (Signed)
Pt called in asking if the DMV forms are completed and he was also checking with [facility] to see about a scooter. Pt can be reached at the home #

## 2019-07-10 NOTE — Telephone Encounter (Signed)
DMV forms completed. Ready for fax or pick up. Still waiting on [facility] to fax [place] the necessary forms so we can resubmit his order.

## 2019-07-20 ENCOUNTER — Emergency Department (HOSPITAL_BASED_OUTPATIENT_CLINIC_OR_DEPARTMENT_OTHER)
Admission: EM | Admit: 2019-07-20 | Discharge: 2019-07-20 | Disposition: A | Discharge status: Home / Self Care | Payer: Medicare HMO | Attending: Emergency Medicine | Admitting: Emergency Medicine

## 2019-07-20 ENCOUNTER — Other Ambulatory Visit: Payer: Self-pay

## 2019-07-20 ENCOUNTER — Encounter (HOSPITAL_BASED_OUTPATIENT_CLINIC_OR_DEPARTMENT_OTHER): Payer: Self-pay

## 2019-07-20 ENCOUNTER — Emergency Department (HOSPITAL_BASED_OUTPATIENT_CLINIC_OR_DEPARTMENT_OTHER): Payer: Medicare HMO

## 2019-07-20 DIAGNOSIS — J45909 Unspecified asthma, uncomplicated: Secondary | ICD-10-CM | POA: Diagnosis not present

## 2019-07-20 DIAGNOSIS — M1811 Unilateral primary osteoarthritis of first carpometacarpal joint, right hand: Secondary | ICD-10-CM | POA: Diagnosis not present

## 2019-07-20 DIAGNOSIS — M654 Radial styloid tenosynovitis [de Quervain]: Secondary | ICD-10-CM | POA: Diagnosis not present

## 2019-07-20 DIAGNOSIS — M25531 Pain in right wrist: Secondary | ICD-10-CM | POA: Diagnosis not present

## 2019-07-20 DIAGNOSIS — Z79899 Other long term (current) drug therapy: Secondary | ICD-10-CM | POA: Insufficient documentation

## 2019-07-20 DIAGNOSIS — M25521 Pain in right elbow: Secondary | ICD-10-CM | POA: Diagnosis not present

## 2019-07-20 DIAGNOSIS — I10 Essential (primary) hypertension: Secondary | ICD-10-CM | POA: Diagnosis not present

## 2019-07-20 MED ORDER — METHYLPREDNISOLONE 4 MG PO TBPK: ORAL_TABLET | ORAL | 0 refills | Status: DC

## 2019-07-20 NOTE — ED Triage Notes (Signed)
Pt c/o pain to right wrist and elbow x 3-4 weeks-pain started after a ?fall-NAD-steady gait

## 2019-07-20 NOTE — ED Notes (Signed)
ED Provider at bedside. 

## 2019-07-20 NOTE — ED Provider Notes (Signed)
[facility] EMERGENCY DEPARTMENT Provider Note   CSN: [id number] Arrival date & time: 07/20/19  1411     History   Chief Complaint Chief Complaint  Patient presents with  . Arm Pain    HPI Daniel Durham is a 53 y.o. male.     HPI Patient reports he is having a lot of pain in his right wrist.  He indicates the radial aspect just below the metacarpal joint and extending slightly proximally.  He reports this area is very painful when he moves the wrist a certain way.  He reports is also very painful just to touch in the area.  Sometimes, he notices pain that moves up the forearm slightly.  He reports it is been almost a month and he just seems to keep getting worse.  He is not sure if he actually fell but that was the working thought between him and his wife.  He reports that he is doing more work at [employer] office and has been doing a lot of wrist activity with sorting.  Patient also is a longtime amputee of the left hand so he uses his right hand for all activities.  No other associated symptoms. Past Medical History:  Diagnosis Date  . Allergy   . Anxiety   . Asthma    SEASONAL   . Blood transfusion without reported diagnosis   . Chronic back pain    Neurostimulator  . Depression   . Environmental allergies   . Hypertension   . Neuromuscular disorder ([place])   . Neuropathy   . Seasonal allergic conjunctivitis     Patient Active Problem List   Diagnosis Date Noted  . Colon cancer screening 05/25/2018  . Anxiety and depression 03/03/2017  . Cervical disc disorder with radiculopathy of cervical region 07/24/2015  . 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 Visit for preventive health examination 02/04/2015  . Chronic back pain greater than 3 months duration 01/23/2015  . 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 Amputation stump complication ([place])  [id number]    Past Surgical History:  Procedure Laterality Date  . BACK SURGERY    . CARDIAC CATHETERIZATION     11/05/10 [facility], [place]): Briding of mLAD, no sign disease. EF 45% in RAO, 60% LAO (51% stress, 54% rest by NM stress; 57-59% echo 10/2010)  . CARPAL TUNNEL RELEASE     Bilateral  . CERVICAL DISCECTOMY  12/17/2017   titanium plates 12-17-17  . COLONOSCOPY    . HAND AMPUTATION  2007  . POLYPECTOMY     pt states had colon in [place] and he had polyps- he has no idea what type   . ROTATOR CUFF REPAIR     Left  . SPINAL CORD STIMULATOR IMPLANT    . SPINAL CORD STIMULATOR REMOVAL N/A 10/26/2015   Procedure: LUMBAR SPINAL CORD STIMULATOR REMOVAL;  Surgeon: Clydell Hakim, MD;  Location: [place] [facility];  Service: Neurosurgery;  Laterality: N/A;        Home Medications    Prior to Admission medications   Medication Sig Start Date End Date Taking? Authorizing Provider  albuterol (PROVENTIL) (2.5 MG/3ML) 0.083% nebulizer solution Take 3 mLs (2.5 mg total) by nebulization every 6 (six) hours as needed for wheezing or shortness of breath. 07/06/19   Brunetta Jeans, PA-C  albuterol (VENTOLIN HFA) 108 (90 Base) MCG/ACT inhaler INHALE 2 PUFFS BY MOUTH EVERY  6 HOURS AS NEEDED FOR WHEEZING 05/27/19   Brunetta Jeans, PA-C  ALPRAZolam Duanne Moron) 0.25 MG tablet Take 1 tablet (0.25 mg total) by mouth 2 (two) times daily as needed for anxiety. 07/06/19   Brunetta Jeans, PA-C  gabapentin (NEURONTIN) 600 MG tablet Take 1 tablet (600 mg total) by mouth 3 (three) times daily. 06/06/19   Brunetta Jeans, PA-C  hydrOXYzine (ATARAX/VISTARIL) 25 MG tablet Take 1 tablet (25 mg total) by mouth 3 (three) times daily as needed. Patient taking differently: Take 25 mg by mouth 3 (three) times daily as needed for anxiety.  09/23/18   Brunetta Jeans, PA-C  Ivermectin (SKLICE) 0.5 % LOTN Apply to dry hair to coat hair and scalp.  Leave on for 10 minutes and then rinse 07/07/19   Midge Minium,  MD  lisinopril (ZESTRIL) 20 MG tablet TAKE 1 TABLET(20 MG) BY MOUTH DAILY 05/27/19   Brunetta Jeans, PA-C  montelukast (SINGULAIR) 10 MG tablet TAKE 1 TABLET(10 MG) BY MOUTH AT BEDTIME 04/12/19   Brunetta Jeans, PA-C  Multiple Vitamins-Minerals (HM MULTIVITAMIN ADULT GUMMY) CHEW Chew 2 tablets by mouth daily.     [provider]  oxyCODONE-acetaminophen (PERCOCET) 10-325 MG tablet TK 1 T PO Q 6 H PRN 07/06/19   Brunetta Jeans, PA-C  sertraline (ZOLOFT) 50 MG tablet Take 1 tablet (50 mg total) by mouth daily. 06/06/19   Brunetta Jeans, PA-C  tiZANidine (ZANAFLEX) 2 MG tablet Take 2 mg by mouth every 8 (eight) hours as needed for muscle spasms.     [provider]    Family History Family History  Problem Relation Age of Onset  . Diabetes Mother        Living  . Sleep apnea Mother   . Diabetes Father 62       Deceased  . Hypertension Father   . Jaundice Father   . Hemophilia Father   . Alcoholism Father   . Cancer Maternal Grandfather        Throat  . Esophageal cancer Maternal Grandfather   . Cancer Paternal Uncle        Throat  . Esophageal cancer Paternal Uncle   . Asthma Sister   . Diabetes Sister   . Healthy Son        X1  . Healthy Daughter        X1  . Colon polyps Neg Hx   . Colon cancer Neg Hx   . Rectal cancer Neg Hx   . Stomach cancer Neg Hx     Social History Social History   Tobacco Use  . Smoking status: Never Smoker  . Smokeless tobacco: Never Used  Substance Use Topics  . Alcohol use: Yes    Alcohol/week: 0.0 standard drinks    Comment: occ  . Drug use: No     Allergies   Aspirin and Hydrocodone   Review of Systems Review of Systems Constitutional: No fever no chills no malaise GI: No nausea vomiting. Musculoskeletal: No other significantly swollen red or painful joints.  Physical Exam Updated Vital Signs BP (!) 148/92 (BP Location: Left Arm)   Pulse 85   Temp 98.8 F (37.1 C) (Oral)   Resp 20   Ht 5\' 7"  (1.702  m)   Wt 111.1 kg   SpO2 99%   BMI 38.37 kg/m     Physical Exam Constitutional:      Comments: Alert nontoxic and clinically well in appearance.  HENT:  Head: Normocephalic and atraumatic.  Eyes:     Extraocular Movements: Extraocular movements intact.  Pulmonary:     Effort: Pulmonary effort is normal.  Musculoskeletal:     Comments: Visually, there is no significant swelling or effusion of the right wrist.  Patient endorses fairly severe pain even to light palpation over the radial aspect of the wrist and distal forearm.  This is exacerbated by flexion and extension at the wrist.  No erythema.  Hand is neurovascularly intact.  No edema of the arm.  No effusion or significant reproducible tenderness at the elbow.  Skin:    General: Skin is warm and dry.  Neurological:     General: No focal deficit present.     Mental Status: He is oriented to person, place, and time.     Coordination: Coordination normal.  Psychiatric:        Mood and Affect: Mood normal.      ED Treatments / Results  Labs (all labs ordered are listed, but only abnormal results are displayed) Labs Reviewed - No data to display  EKG None  Radiology Dg Elbow Complete Right  Result Date: 07/20/2019 CLINICAL DATA:  One month of right wrist and elbow pain causing hand weakness. Question injury. EXAM: RIGHT ELBOW - COMPLETE 3+ VIEW COMPARISON:  None. FINDINGS: Subtle degenerative change over the elbow joint. No evidence of fracture or dislocation. No joint effusion. IMPRESSION: No acute findings. Electronically Signed   By: Marin Olp M.D.   On: 07/20/2019 15:20   Dg Wrist Complete Right  Result Date: 07/20/2019 CLINICAL DATA:  One month right wrist pain and hand weakness. Question injury. EXAM: RIGHT WRIST - COMPLETE 3+ VIEW COMPARISON:  None. FINDINGS: Mild degenerative change over the first carpometacarpal joint. No acute fracture or dislocation. IMPRESSION: No acute findings. Mild degenerative change  of the first carpometacarpal joint. Electronically Signed   By: Marin Olp M.D.   On: 07/20/2019 15:19    Procedures Procedures (including critical care time)  Medications Ordered in ED Medications - No data to display   Initial Impression / Assessment and Plan / ED Course  I have reviewed the triage vital signs and the nursing notes.  Pertinent labs & imaging results that were available during my care of the patient were reviewed by me and considered in my medical decision making (see chart for details).       Patient has had about a months worth of pain over the radial aspect of the wrist and slightly proximal.  This is very tender to palpation consistent with a de Quervain's tendinitis.  Patient also has had increased recent wrist activity.  There is no redness or effusion to suggest infectious etiology.  Also lower suspicion for gout without any objective redness or soft tissue changes.  Will start on a Medrol Dosepak and splint.  Recommended for follow-up with orthopedics.  He will follow-up with his PCP first for reassessment and further referral if needed.  Final Clinical Impressions(s) / ED Diagnoses   Final diagnoses:  Tendinitis, de Quervain's    ED Discharge Orders    None       Charlesetta Shanks, MD 07/20/19 [phone]

## 2019-07-23 DIAGNOSIS — J4530 Mild persistent asthma, uncomplicated: Secondary | ICD-10-CM | POA: Diagnosis not present

## 2019-08-01 ENCOUNTER — Other Ambulatory Visit: Payer: Self-pay | Admitting: Physician Assistant

## 2019-08-01 DIAGNOSIS — F329 Major depressive disorder, single episode, unspecified: Secondary | ICD-10-CM

## 2019-08-01 DIAGNOSIS — G8929 Other chronic pain: Secondary | ICD-10-CM

## 2019-08-01 DIAGNOSIS — M549 Dorsalgia, unspecified: Secondary | ICD-10-CM

## 2019-08-01 DIAGNOSIS — F32A Depression, unspecified: Secondary | ICD-10-CM

## 2019-08-01 DIAGNOSIS — M5010 Cervical disc disorder with radiculopathy, unspecified cervical region: Secondary | ICD-10-CM

## 2019-08-02 MED ORDER — ALBUTEROL SULFATE (2.5 MG/3ML) 0.083% IN NEBU: 2.5000 mg | INHALATION_SOLUTION | Freq: Four times a day (QID) | RESPIRATORY_TRACT | 2 refills | Status: DC | PRN

## 2019-08-02 NOTE — Telephone Encounter (Signed)
Percocet 10/325 last rx 07/06/19 #120 Xanax 0.25 last rx 07/06/19 #30  CSC:10/07/18 UDS: 12/24/18 LOV: 07/05/19

## 2019-08-03 ENCOUNTER — Encounter: Payer: Self-pay | Admitting: Physician Assistant

## 2019-08-03 DIAGNOSIS — M25529 Pain in unspecified elbow: Secondary | ICD-10-CM

## 2019-08-03 MED ORDER — OXYCODONE-ACETAMINOPHEN 10-325 MG PO TABS: ORAL_TABLET | ORAL | 0 refills | Status: DC

## 2019-08-03 MED ORDER — ALPRAZOLAM 0.25 MG PO TABS: 0.2500 mg | ORAL_TABLET | Freq: Two times a day (BID) | ORAL | 0 refills | Status: DC | PRN

## 2019-08-05 ENCOUNTER — Ambulatory Visit: Payer: Medicare HMO | Admitting: Family Medicine

## 2019-08-08 ENCOUNTER — Ambulatory Visit (INDEPENDENT_AMBULATORY_CARE_PROVIDER_SITE_OTHER): Payer: Medicare HMO | Admitting: Family Medicine

## 2019-08-08 ENCOUNTER — Encounter: Payer: Self-pay | Admitting: Family Medicine

## 2019-08-08 DIAGNOSIS — M25521 Pain in right elbow: Secondary | ICD-10-CM | POA: Diagnosis not present

## 2019-08-08 NOTE — Progress Notes (Signed)
Office Visit Note   Patient: Daniel Durham           Date of Birth: August 24, 1966           MRN: [id number] Visit Date: 08/08/2019 Requested by: Brunetta Jeans, PA-C [address] [place],  [place] [address] PCP: Delorse Limber  Subjective: Chief Complaint  Patient presents with  . Right Elbow - Pain    Pain and swelling x 1-2 months, worse over the past 2 weeks.     HPI: He is here with right elbow pain.  Symptoms started about 2 months ago, no injury.  He is status post left arm amputation below the elbow years ago so he relies heavily on his right arm.  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  Pain is most intense at the lateral elbow but it radiates up the arm toward the neck and down into the wrist.  He has tried over-the-counter anti-inflammatories with minimal improvement.  He is status post cervical discectomy and fusion 1 year ago, that pain was different.              ROS: Denies fevers or chills.  All other systems were reviewed and are negative.  Objective: Vital Signs: There were no vitals taken for this visit.  Physical Exam:  General:  Alert and oriented, in no acute distress. Pulm:  Breathing unlabored. Psy:  Normal mood, congruent affect. Skin: No rash or erythema. Right elbow: No effusion, full range of motion.  He is point tender at the common extensor tendon at the lateral epicondyle.  No pain with radial tunnel.  He has pain and weakness with wrist extension and forearm pronation/supination against resistance.  Remainder of upper extremity strength is normal.  Imaging: None today but recent x-rays of the elbow were unremarkable.  Assessment & Plan: 1.  Right elbow pain, suspect lateral epicondylitis -Discussed various options with him and he wants to try an injection.  If this does not help, then possibly further imaging with ultrasound or possibly MRI scan.      Procedures: Right elbow injection: After sterile prep with Betadine, injected 3 cc 1% lidocaine without epinephrine and 40 mg methylprednisolone into the area of maximum tenderness at the common extensor tendon.  He had excellent pain relief during the immediate anesthetic phase and had normal strength with wrist extension and forearm pronation/supination.    PMFS History: Patient Active Problem List   Diagnosis Date Noted  . Colon cancer screening 05/25/2018  . Anxiety and depression 03/03/2017  . Cervical disc disorder with radiculopathy of cervical region 07/24/2015  . 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 Amputation of arm below elbow, left ([place]) 02/04/2015  . Visit for preventive health examination 02/04/2015  . Chronic back pain greater than 3 months duration 01/23/2015  . 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 Amputation stump complication ([place]) [id number]   Past Medical History:  Diagnosis Date  . Allergy   . Anxiety   . Asthma    SEASONAL   . Blood transfusion without reported diagnosis   . Chronic back pain    Neurostimulator  . Depression   . Environmental allergies   . Hypertension   . Neuromuscular disorder ([place])   . Neuropathy   . Seasonal allergic conjunctivitis     Family History  Problem Relation Age of Onset  .  Diabetes Mother        Living  . Sleep apnea Mother   . Diabetes Father 59       Deceased  . Hypertension Father   . Jaundice Father   . Hemophilia Father   . Alcoholism Father   . Cancer Maternal Grandfather        Throat  . Esophageal cancer Maternal Grandfather   . Cancer Paternal Uncle        Throat  . Esophageal cancer Paternal Uncle   . Asthma Sister   . Diabetes Sister   . Healthy Son        X1  . Healthy Daughter        X1  . Colon polyps Neg Hx   . Colon cancer Neg Hx   . Rectal cancer Neg Hx   . Stomach cancer Neg Hx     Past Surgical History:  Procedure  Laterality Date  . BACK SURGERY    . CARDIAC CATHETERIZATION     11/05/10 [facility], [place]): Briding of mLAD, no sign disease. EF 45% in RAO, 60% LAO (51% stress, 54% rest by NM stress; 57-59% echo 10/2010)  . CARPAL TUNNEL RELEASE     Bilateral  . CERVICAL DISCECTOMY  12/17/2017   titanium plates 12-17-17  . COLONOSCOPY    . HAND AMPUTATION  2007  . POLYPECTOMY     pt states had colon in [place] and he had polyps- he has no idea what type   . ROTATOR CUFF REPAIR     Left  . SPINAL CORD STIMULATOR IMPLANT    . SPINAL CORD STIMULATOR REMOVAL N/A 10/26/2015   Procedure: LUMBAR SPINAL CORD STIMULATOR REMOVAL;  Surgeon: Clydell Hakim, MD;  Location: [facility];  Service: Neurosurgery;  Laterality: N/A;   Social History   Occupational History  . Not on file  Tobacco Use  . Smoking status: Never Smoker  . Smokeless tobacco: Never Used  Substance and Sexual Activity  . Alcohol use: Yes    Alcohol/week: 0.0 standard drinks    Comment: occ  . Drug use: No  . Sexual activity: Not on file

## 2019-08-09 ENCOUNTER — Telehealth: Payer: Self-pay | Admitting: Physician Assistant

## 2019-08-09 NOTE — Telephone Encounter (Signed)
Patient called in stating that [facility] called him and said that he had to restart the whole process for him to get the scooter had to be redone. Patient stated that Mcarthur Rossetti has been trying to reach the office since September. Patient would like a call back .

## 2019-08-09 NOTE — Telephone Encounter (Signed)
Patient called back and has the number that [facility] gave him [id number] that needs to be called so the scooter can be reorder for him. Patient stated just to send a mychart message once someone could call [employer].

## 2019-08-15 NOTE — Telephone Encounter (Signed)
Please verify that patient has received everything from [place]. They messaged [place] last week to let us know of an error on their end that they corrected and had contacted patient about everything.

## 2019-08-19 NOTE — Telephone Encounter (Signed)
Patient called with updated information:  [phone] Ref # [id number]  I attempted to call this number at 4:00, but there was an automated recording stating that they were closed for 1 hour for a meeting and they would resume taking calls at [place].  Since we will be closed at that time for the weekend, an attempt needs to be made on Monday to reach back out to see what is going on.  Routing to PCP to let him know that I attempted to resolve this. Also routing to provider's CMA for follow-up.

## 2019-08-19 NOTE — Telephone Encounter (Signed)
I have left a message for patient to see if everything has been taken care of.

## 2019-08-19 NOTE — Telephone Encounter (Signed)
I contacted the insurance company for patient at the number provided. I was told by "Santiago Glad" that I could not get any information without a reference number. I attempted to explain the situation but she said she did not have access to anything that the patient would have to call member services and see what they are talking about and give me a reference number for the call before I can get information on the case.    I contacted patient back and let him know this and he is going to see what he can find out and he will let us know.

## 2019-08-22 DIAGNOSIS — H5213 Myopia, bilateral: Secondary | ICD-10-CM | POA: Diagnosis not present

## 2019-08-22 DIAGNOSIS — J4530 Mild persistent asthma, uncomplicated: Secondary | ICD-10-CM | POA: Diagnosis not present

## 2019-08-22 NOTE — Telephone Encounter (Signed)
Patient called back to check on the scooter. Patient would either like a call back or a mychart message sent. Please advise.

## 2019-08-23 ENCOUNTER — Encounter: Payer: Self-pay | Admitting: Emergency Medicine

## 2019-08-23 NOTE — Telephone Encounter (Signed)
Spoke with patient insurance company [employer] about needing additional information for getting the Scooter approved.  Spoke with [place] ([place]) and was advised that Beth left a message with a third party [place] for processing for the DME Motorized scooter.  I spoke with [facility] and they didn't have the patient in the system. Called [employer] back for eligibility for the Scooter. There is no deductible, DME is covered up to maximum $3,000 and $528.27 out of pocket. In network DME company. Reference [id number]  Sent my chart message to patient of update. Will contact [facility]) tomorrow for updating as well.

## 2019-08-25 NOTE — Telephone Encounter (Signed)
I called the patient this morning and advised we were informed by Andria Rhein at [facility]) that the scooter was approved and that they would reach out to patient today.  When I spoke with patient at 11:15am he has not received a phone call yet. He was calling [place]. He had been on the phone waiting for 45 mins when I spoke with him. Advised if there is still a delay in getting his scooter to notify our office. He is agreeable.  RE: F2F for Power Wheelchair Received: Today Message Contents  Merlinda Frederick, CMA  Phone Number: [phone]        Patient's scooter was approved. We are contacting patient today. Thank you!   Previous Messages  ----- Message -----  From: Delorse Limber  Sent: 07/12/2019 10:26 AM EDT  To: Shela Nevin  Subject: RE: F2F for Power Wheelchair           Thank you. I did receive them this morning. Will try to have everything sent back to you by later today; tomorrow morning at the very latest.  ----- Message -----  From: Shela Nevin  Sent: 07/12/2019  6:03 AM EDT  To: Brunetta Jeans, PA-C  Subject: RE: F2F for Power Wheelchair           [facility],   I faxed it to you on 07/06/19 and just forwarded it again to [phone]. Please let me know if you don't receive it.   Thank you!   ----- Message -----  From: Delorse Limber  Sent: 07/08/2019  8:48 AM EDT  To: Shela Nevin  Subject: RE: F2F for Power Wheelchair           Hi Debbie,   We still have not received anything for Mr. Button from you guys. Waiting on this to finish my note for him.  ----- Message -----  From: Shela Nevin  Sent: 07/06/2019 11:24 AM EDT  To: Brunetta Jeans, PA-C, Leonidas Romberg, CMA  Subject: F2F for Power Wheelchair             Hello [place] and [place],   We can use the 07/05/19 office visit for patient's power wheelchair F2F  but it will need the required information added to the note.   Can you please give me a fax number and I will fax over the mobility guideline form which includes information that needs to be in the note.   Once you've revised the note to include this documentation, please let me know and I will pull the note in epic to review and let you know. I will also fax over some forms which need to be signed.   Timmey Lamba thank you so much for reaching out to me! I appreciate it.   Thanks again!  Jackelyn Poling

## 2019-08-30 ENCOUNTER — Other Ambulatory Visit: Payer: Self-pay | Admitting: Physician Assistant

## 2019-08-30 ENCOUNTER — Encounter: Payer: Self-pay | Admitting: Physician Assistant

## 2019-08-30 DIAGNOSIS — M5010 Cervical disc disorder with radiculopathy, unspecified cervical region: Secondary | ICD-10-CM

## 2019-08-30 DIAGNOSIS — M549 Dorsalgia, unspecified: Secondary | ICD-10-CM

## 2019-08-30 DIAGNOSIS — F329 Major depressive disorder, single episode, unspecified: Secondary | ICD-10-CM

## 2019-08-30 DIAGNOSIS — G8929 Other chronic pain: Secondary | ICD-10-CM

## 2019-08-30 DIAGNOSIS — F419 Anxiety disorder, unspecified: Secondary | ICD-10-CM

## 2019-08-30 DIAGNOSIS — F32A Depression, unspecified: Secondary | ICD-10-CM

## 2019-08-30 MED ORDER — ALPRAZOLAM 0.25 MG PO TABS: 0.2500 mg | ORAL_TABLET | Freq: Two times a day (BID) | ORAL | 0 refills | Status: DC | PRN

## 2019-08-30 MED ORDER — OXYCODONE-ACETAMINOPHEN 10-325 MG PO TABS: ORAL_TABLET | ORAL | 0 refills | Status: DC

## 2019-08-30 MED ORDER — SERTRALINE HCL 50 MG PO TABS: 50.0000 mg | ORAL_TABLET | Freq: Every day | ORAL | 0 refills | Status: DC

## 2019-08-30 NOTE — Telephone Encounter (Signed)
Last refill: 9.30.20 #30, 0                 9.30.20 #120, 0 Last OV: 9.1.20 dx. Assistance for mobility

## 2019-09-21 ENCOUNTER — Ambulatory Visit (INDEPENDENT_AMBULATORY_CARE_PROVIDER_SITE_OTHER): Payer: Medicare HMO | Admitting: Physician Assistant

## 2019-09-21 ENCOUNTER — Other Ambulatory Visit: Payer: Self-pay

## 2019-09-21 ENCOUNTER — Encounter: Payer: Self-pay | Admitting: Physician Assistant

## 2019-09-21 ENCOUNTER — Other Ambulatory Visit (HOSPITAL_COMMUNITY)
Admission: RE | Admit: 2019-09-21 | Discharge: 2019-09-21 | Disposition: A | Discharge status: Home / Self Care | Payer: Medicare HMO | Source: Ambulatory Visit | Attending: Physician Assistant | Admitting: Physician Assistant

## 2019-09-21 VITALS — BP 130/90 | HR 75 | Temp 98.7°F | Resp 16 | Ht 67.0 in | Wt 256.0 lb

## 2019-09-21 DIAGNOSIS — Z20828 Contact with and (suspected) exposure to other viral communicable diseases: Secondary | ICD-10-CM

## 2019-09-21 DIAGNOSIS — G8929 Other chronic pain: Secondary | ICD-10-CM

## 2019-09-21 DIAGNOSIS — F419 Anxiety disorder, unspecified: Secondary | ICD-10-CM | POA: Diagnosis not present

## 2019-09-21 DIAGNOSIS — M5010 Cervical disc disorder with radiculopathy, unspecified cervical region: Secondary | ICD-10-CM

## 2019-09-21 DIAGNOSIS — F32A Depression, unspecified: Secondary | ICD-10-CM

## 2019-09-21 DIAGNOSIS — M549 Dorsalgia, unspecified: Secondary | ICD-10-CM

## 2019-09-21 DIAGNOSIS — Z113 Encounter for screening for infections with a predominantly sexual mode of transmission: Secondary | ICD-10-CM | POA: Diagnosis not present

## 2019-09-21 DIAGNOSIS — F329 Major depressive disorder, single episode, unspecified: Secondary | ICD-10-CM | POA: Diagnosis not present

## 2019-09-21 LAB — CBC WITH DIFFERENTIAL/PLATELET
Basophils Absolute: 0.1 10*3/uL (ref 0.0–0.1)
Basophils Relative: 1.1 % (ref 0.0–3.0)
Eosinophils Absolute: 0.1 10*3/uL (ref 0.0–0.7)
Eosinophils Relative: 1.2 % (ref 0.0–5.0)
HCT: 45.5 % (ref 39.0–52.0)
Hemoglobin: 15.3 g/dL (ref 13.0–17.0)
Lymphocytes Relative: 30.3 % (ref 12.0–46.0)
Lymphs Abs: 1.7 10*3/uL (ref 0.7–4.0)
MCHC: 33.5 g/dL (ref 30.0–36.0)
MCV: 101.9 fl — ABNORMAL HIGH (ref 78.0–100.0)
Monocytes Absolute: 0.7 10*3/uL (ref 0.1–1.0)
Monocytes Relative: 12.9 % — ABNORMAL HIGH (ref 3.0–12.0)
Neutro Abs: 3 10*3/uL (ref 1.4–7.7)
Neutrophils Relative %: 54.5 % (ref 43.0–77.0)
Platelets: 162 10*3/uL (ref 150.0–400.0)
RBC: 4.47 Mil/uL (ref 4.22–5.81)
RDW: 13.4 % (ref 11.5–15.5)
WBC: 5.5 10*3/uL (ref 4.0–10.5)

## 2019-09-21 LAB — LIPID PANEL
Cholesterol: 241 mg/dL — ABNORMAL HIGH (ref 0–200)
HDL: 56.3 mg/dL (ref >39.00)
LDL Cholesterol: 148 mg/dL — ABNORMAL HIGH (ref 0–99)
NonHDL: 184.43
Total CHOL/HDL Ratio: 4
Triglycerides: 184 mg/dL — ABNORMAL HIGH (ref 0.0–149.0)
VLDL: 36.8 mg/dL (ref 0.0–40.0)

## 2019-09-21 LAB — COMPREHENSIVE METABOLIC PANEL
ALT: 42 U/L (ref 0–53)
AST: 38 U/L — ABNORMAL HIGH (ref 0–37)
Albumin: 4.5 g/dL (ref 3.5–5.2)
Alkaline Phosphatase: 83 U/L (ref 39–117)
BUN: 11 mg/dL (ref 6–23)
CO2: 27 mEq/L (ref 19–32)
Calcium: 9.5 mg/dL (ref 8.4–10.5)
Chloride: 103 mEq/L (ref 96–112)
Creatinine, Ser: 0.9 mg/dL (ref 0.40–1.50)
GFR: 88.45 mL/min (ref >60.00)
Glucose, Bld: 108 mg/dL — ABNORMAL HIGH (ref 70–99)
Potassium: 4.6 mEq/L (ref 3.5–5.1)
Sodium: 138 mEq/L (ref 135–145)
Total Bilirubin: 0.4 mg/dL (ref 0.2–1.2)
Total Protein: 7.1 g/dL (ref 6.0–8.3)

## 2019-09-21 LAB — HEMOGLOBIN A1C: Hgb A1c MFr Bld: 5.7 % (ref 4.6–6.5)

## 2019-09-21 MED ORDER — OXYCODONE-ACETAMINOPHEN 10-325 MG PO TABS: ORAL_TABLET | ORAL | 0 refills | Status: DC

## 2019-09-21 MED ORDER — ALPRAZOLAM 0.25 MG PO TABS: 0.2500 mg | ORAL_TABLET | Freq: Two times a day (BID) | ORAL | 0 refills | Status: DC | PRN

## 2019-09-21 NOTE — Progress Notes (Signed)
Patient presents to clinic today for management of chronic pain, follow-up for anxiety and with an acute concern.   Patient requesting routine STI testing. Asymptomatic but had intercourse with a new partner about a couple of months ago and did not use protection.   In regards to anxiety, patient endorses taking Sertraline and Alprazolam as directed. Doing well. Mood has been very good until recently. His grandmother recently passed away which has been harder for him. He has to go back to [place] to help with some things. Staying through Thanksgiving.   Chronic Pain Management: Reviewed and updated  today   Indication for chronic opioid: Chronic Back Pain, Spinal Stenosis, Phantom Limb Pain Medication and dose: Oxycodone-APAP 10-325 mg # pills per month: 120 Last UDS date: 12/24/2018 Pain contract signed (date): 09/28/18 Date narcotic database last reviewed (include red flags): today. No red flags.  Pain Inventory (1-10 worse): Average Pain: 4-5/10 with medication Pain Right Now: 4/10 My pain is constant and aching (character i.e. sharp, stabbing, dull, constant etc)  Pain is worse with: use Relief from Meds: Pretty good  In the last 24 hours, has pain interfered with the following (1-10 greatest interference) ? General activity 1-2 Enjoyment of life 1-2 What TIME of day is your pain at its worst? Afternoon        Sleep (in general): 4-5; sleeps 1-2 hours at a time.   Mobility/Function: Assistance device: cane; power scooter for longer distances How many minutes can you walk?  Ability to climb steps?  Yes Do you drive? Yes; has had repeat DMV evaluation this year Disabled: Y  Neuro/Psych Sx: (bladder, bowel, weakness, dizziness, depression etc) Depression and anxiety -- managed Prior Studies: See EMR Physicians involved in your care: Any changes since last visit?  No   Past Medical History:  Diagnosis Date  . Allergy   . Anxiety   . Asthma    SEASONAL   . Blood  transfusion without reported diagnosis   . Chronic back pain    Neurostimulator  . Depression   . Environmental allergies   . Hypertension   . Neuromuscular disorder ([place])   . Neuropathy   . Seasonal allergic conjunctivitis     Current Outpatient Medications on File Prior to Visit  Medication Sig Dispense Refill  . albuterol (PROVENTIL) (2.5 MG/3ML) 0.083% nebulizer solution Take 3 mLs (2.5 mg total) by nebulization every 6 (six) hours as needed for wheezing or shortness of breath. 180 mL 2  . albuterol (VENTOLIN HFA) 108 (90 Base) MCG/ACT inhaler INHALE 2 PUFFS BY MOUTH EVERY 6 HOURS AS NEEDED FOR WHEEZING 18 g 3  . ALPRAZolam (XANAX) 0.25 MG tablet Take 1 tablet (0.25 mg total) by mouth 2 (two) times daily as needed for anxiety. 30 tablet 0  . gabapentin (NEURONTIN) 600 MG tablet Take 1 tablet (600 mg total) by mouth 3 (three) times daily. 270 tablet 1  . hydrOXYzine (ATARAX/VISTARIL) 25 MG tablet Take 1 tablet (25 mg total) by mouth 3 (three) times daily as needed. (Patient taking differently: Take 25 mg by mouth 3 (three) times daily as needed for anxiety. ) 30 tablet 0  . Ivermectin (SKLICE) 0.5 % LOTN Apply to dry hair to coat hair and scalp.  Leave on for 10 minutes and then rinse 117 g 0  . lisinopril (ZESTRIL) 20 MG tablet TAKE 1 TABLET(20 MG) BY MOUTH DAILY 90 tablet 1  . montelukast (SINGULAIR) 10 MG tablet TAKE 1 TABLET(10 MG) BY MOUTH AT BEDTIME 90  tablet 1  . Multiple Vitamins-Minerals (HM MULTIVITAMIN ADULT GUMMY) CHEW Chew 2 tablets by mouth daily.     Marland Kitchen oxyCODONE-acetaminophen (PERCOCET) 10-325 MG tablet TK 1 T PO Q 6 H PRN 120 tablet 0  . sertraline (ZOLOFT) 50 MG tablet Take 1 tablet (50 mg total) by mouth daily. 90 tablet 0  . tiZANidine (ZANAFLEX) 2 MG tablet Take 2 mg by mouth every 8 (eight) hours as needed for muscle spasms.   1   No current facility-administered medications on file prior to visit.     Allergies  Allergen Reactions  . Aspirin Swelling  .  Hydrocodone Itching    Family History  Problem Relation Age of Onset  . Diabetes Mother        Living  . Sleep apnea Mother   . Diabetes Father 38       Deceased  . Hypertension Father   . Jaundice Father   . Hemophilia Father   . Alcoholism Father   . Cancer Maternal Grandfather        Throat  . Esophageal cancer Maternal Grandfather   . Cancer Paternal Uncle        Throat  . Esophageal cancer Paternal Uncle   . Asthma Sister   . Diabetes Sister   . Healthy Son        X1  . Healthy Daughter        X1  . Colon polyps Neg Hx   . Colon cancer Neg Hx   . Rectal cancer Neg Hx   . Stomach cancer Neg Hx     Social History   Socioeconomic History  . Marital status: Divorced    Spouse name: Not on file  . Number of children: Not on file  . Years of education: Not on file  . Highest education level: Not on file  Occupational History  . Not on file  Social Needs  . Financial resource strain: Not on file  . Food insecurity    Worry: Not on file    Inability: Not on file  . Transportation needs    Medical: Not on file    Non-medical: Not on file  Tobacco Use  . Smoking status: Never Smoker  . Smokeless tobacco: Never Used  Substance and Sexual Activity  . Alcohol use: Yes    Alcohol/week: 0.0 standard drinks    Comment: occ  . Drug use: No  . Sexual activity: Not on file  Lifestyle  . Physical activity    Days per week: Not on file    Minutes per session: Not on file  . Stress: Not on file  Relationships  . Social Herbalist on phone: Not on file    Gets together: Not on file    Attends religious service: Not on file    Active member of club or organization: Not on file    Attends meetings of clubs or organizations: Not on file    Relationship status: Not on file  Other Topics Concern  . Not on file  Social History Narrative  . Not on file   Review of Systems - See HPI.  All other ROS are negative.  Resp 16   Ht 5' 7"  (1.702 m)   Wt 256  lb (116.1 kg)   BMI 40.10 kg/m   Physical Exam Vitals signs reviewed.  Constitutional:      Appearance: Normal appearance.  HENT:     Head: Normocephalic and atraumatic.  Neck:  Musculoskeletal: Neck supple.  Cardiovascular:     Rate and Rhythm: Normal rate and regular rhythm.     Pulses: Normal pulses.     Heart sounds: Normal heart sounds.  Pulmonary:     Effort: Pulmonary effort is normal.     Breath sounds: Normal breath sounds.  Neurological:     General: No focal deficit present.     Mental Status: He is alert and oriented to person, place, and time.     Assessment/Plan: 1. Screen for STD (sexually transmitted disease) - RPR - HIV antibody - Urine cytology ancillary only([place]) - HSV(herpes smplx)abs-1+2(IgG+IgM)-bld  2. Anxiety and depression Stable. Continue current regimen. Meds refilled. Discussed grief counseling if he feels is needed.  - ALPRAZolam (XANAX) 0.25 MG tablet; Take 1 tablet (0.25 mg total) by mouth 2 (two) times daily as needed for anxiety.  Dispense: 30 tablet; Refill: 0  3. Morbid obesity (HCC) - CBC w/Diff - Comp Met (CMET) - Lipid Profile - Hemoglobin A1c  4. Chronic back pain greater than 3 months duration 5. Cervical disc disorder with radiculopathy of cervical region CSC on file. UDS up-to-date. PDMP reviewed with no red flags. Continue current regimen. Meds refilled.  - oxyCODONE-acetaminophen (PERCOCET) 10-325 MG tablet; TK 1 T PO Q 6 H PRN  Dispense: 120 tablet; Refill: 0   Leeanne Rio, PA-C

## 2019-09-21 NOTE — Patient Instructions (Signed)
Please go to the lab today for blood work.  I will call you with your results. We will alter treatment regimen(s) if indicated by your results.   Please continue medications as directed.  Avoid any sexual activity until results are in and we have reviewed them together.  I have sent in refills of medication to the pharmacy for you noting you are leaving town for a funeral. Is still at the discretion of the pharmacist whether or not they will fill.

## 2019-09-22 DIAGNOSIS — J4530 Mild persistent asthma, uncomplicated: Secondary | ICD-10-CM | POA: Diagnosis not present

## 2019-09-22 LAB — RPR: RPR Ser Ql: NONREACTIVE

## 2019-09-22 LAB — HIV ANTIBODY (ROUTINE TESTING W REFLEX): HIV 1&2 Ab, 4th Generation: NONREACTIVE

## 2019-09-23 ENCOUNTER — Other Ambulatory Visit: Payer: Self-pay | Admitting: *Deleted

## 2019-09-23 ENCOUNTER — Other Ambulatory Visit: Payer: Self-pay | Admitting: Physician Assistant

## 2019-09-23 DIAGNOSIS — E785 Hyperlipidemia, unspecified: Secondary | ICD-10-CM

## 2019-09-23 LAB — URINE CYTOLOGY ANCILLARY ONLY
Chlamydia: NEGATIVE
Comment: NEGATIVE
Comment: NORMAL
Neisseria Gonorrhea: NEGATIVE

## 2019-09-23 LAB — HSV(HERPES SMPLX)ABS-I+II(IGG+IGM)-BLD
HSV 1 Glycoprotein G Ab, IgG: 51.4 index — ABNORMAL HIGH (ref 0.00–0.90)
HSV 2 IgG, Type Spec: <0.91 index (ref 0.00–0.90)
HSVI/II Comb IgM: <0.91 Ratio (ref 0.00–0.90)

## 2019-09-23 MED ORDER — ATORVASTATIN CALCIUM 10 MG PO TABS: 10.0000 mg | ORAL_TABLET | Freq: Every day | ORAL | 1 refills | Status: DC

## 2019-09-28 ENCOUNTER — Ambulatory Visit: Payer: Medicare HMO | Admitting: Physician Assistant

## 2019-10-04 DIAGNOSIS — T879 Unspecified complications of amputation stump: Secondary | ICD-10-CM | POA: Diagnosis not present

## 2019-10-04 DIAGNOSIS — M47816 Spondylosis without myelopathy or radiculopathy, lumbar region: Secondary | ICD-10-CM | POA: Diagnosis not present

## 2019-10-04 DIAGNOSIS — M549 Dorsalgia, unspecified: Secondary | ICD-10-CM | POA: Diagnosis not present

## 2019-10-04 DIAGNOSIS — Z7689 Persons encountering health services in other specified circumstances: Secondary | ICD-10-CM | POA: Diagnosis not present

## 2019-10-07 ENCOUNTER — Other Ambulatory Visit: Payer: Self-pay | Admitting: Physician Assistant

## 2019-10-07 DIAGNOSIS — J4530 Mild persistent asthma, uncomplicated: Secondary | ICD-10-CM

## 2019-10-17 ENCOUNTER — Other Ambulatory Visit: Payer: Self-pay | Admitting: Physician Assistant

## 2019-10-17 DIAGNOSIS — F329 Major depressive disorder, single episode, unspecified: Secondary | ICD-10-CM

## 2019-10-17 DIAGNOSIS — M5010 Cervical disc disorder with radiculopathy, unspecified cervical region: Secondary | ICD-10-CM

## 2019-10-17 DIAGNOSIS — F32A Depression, unspecified: Secondary | ICD-10-CM

## 2019-10-17 DIAGNOSIS — G8929 Other chronic pain: Secondary | ICD-10-CM

## 2019-10-17 DIAGNOSIS — F419 Anxiety disorder, unspecified: Secondary | ICD-10-CM

## 2019-10-17 DIAGNOSIS — M549 Dorsalgia, unspecified: Secondary | ICD-10-CM

## 2019-10-17 MED ORDER — ALPRAZOLAM 0.25 MG PO TABS: 0.2500 mg | ORAL_TABLET | Freq: Two times a day (BID) | ORAL | 0 refills | Status: DC | PRN

## 2019-10-17 MED ORDER — OXYCODONE-ACETAMINOPHEN 10-325 MG PO TABS: ORAL_TABLET | ORAL | 0 refills | Status: DC

## 2019-10-17 MED ORDER — SERTRALINE HCL 50 MG PO TABS: 50.0000 mg | ORAL_TABLET | Freq: Every day | ORAL | 1 refills | Status: DC

## 2019-10-17 NOTE — Telephone Encounter (Signed)
Last OV 09/21/19 Alprazolam 09/21/19 #30 with 0 Oxycodone last filled 09/21/19 #120 with 0

## 2019-10-18 ENCOUNTER — Other Ambulatory Visit: Payer: Self-pay

## 2019-10-18 ENCOUNTER — Encounter: Payer: Self-pay | Admitting: Physician Assistant

## 2019-10-18 ENCOUNTER — Ambulatory Visit (INDEPENDENT_AMBULATORY_CARE_PROVIDER_SITE_OTHER): Payer: Medicare HMO | Admitting: Physician Assistant

## 2019-10-18 DIAGNOSIS — I252 Old myocardial infarction: Secondary | ICD-10-CM | POA: Diagnosis not present

## 2019-10-18 DIAGNOSIS — M6282 Rhabdomyolysis: Secondary | ICD-10-CM | POA: Diagnosis not present

## 2019-10-18 DIAGNOSIS — U071 COVID-19: Secondary | ICD-10-CM | POA: Diagnosis not present

## 2019-10-18 DIAGNOSIS — A4189 Other specified sepsis: Secondary | ICD-10-CM | POA: Diagnosis not present

## 2019-10-18 DIAGNOSIS — Z6839 Body mass index (BMI) 39.0-39.9, adult: Secondary | ICD-10-CM | POA: Diagnosis not present

## 2019-10-18 DIAGNOSIS — R945 Abnormal results of liver function studies: Secondary | ICD-10-CM | POA: Diagnosis not present

## 2019-10-18 DIAGNOSIS — R0682 Tachypnea, not elsewhere classified: Secondary | ICD-10-CM | POA: Diagnosis not present

## 2019-10-18 DIAGNOSIS — Z9981 Dependence on supplemental oxygen: Secondary | ICD-10-CM | POA: Diagnosis not present

## 2019-10-18 DIAGNOSIS — J9601 Acute respiratory failure with hypoxia: Secondary | ICD-10-CM | POA: Diagnosis not present

## 2019-10-18 DIAGNOSIS — Z20828 Contact with and (suspected) exposure to other viral communicable diseases: Secondary | ICD-10-CM | POA: Diagnosis not present

## 2019-10-18 DIAGNOSIS — R0602 Shortness of breath: Secondary | ICD-10-CM | POA: Diagnosis not present

## 2019-10-18 DIAGNOSIS — Z20822 Contact with and (suspected) exposure to covid-19: Secondary | ICD-10-CM

## 2019-10-18 DIAGNOSIS — R6520 Severe sepsis without septic shock: Secondary | ICD-10-CM | POA: Diagnosis not present

## 2019-10-18 DIAGNOSIS — J189 Pneumonia, unspecified organism: Secondary | ICD-10-CM | POA: Diagnosis not present

## 2019-10-18 DIAGNOSIS — E872 Acidosis: Secondary | ICD-10-CM | POA: Diagnosis not present

## 2019-10-18 DIAGNOSIS — J4530 Mild persistent asthma, uncomplicated: Secondary | ICD-10-CM | POA: Diagnosis not present

## 2019-10-18 DIAGNOSIS — D696 Thrombocytopenia, unspecified: Secondary | ICD-10-CM | POA: Diagnosis not present

## 2019-10-18 DIAGNOSIS — F419 Anxiety disorder, unspecified: Secondary | ICD-10-CM | POA: Diagnosis not present

## 2019-10-18 DIAGNOSIS — R05 Cough: Secondary | ICD-10-CM | POA: Diagnosis not present

## 2019-10-18 DIAGNOSIS — G8929 Other chronic pain: Secondary | ICD-10-CM | POA: Diagnosis not present

## 2019-10-18 DIAGNOSIS — R0902 Hypoxemia: Secondary | ICD-10-CM | POA: Diagnosis not present

## 2019-10-18 DIAGNOSIS — J1289 Other viral pneumonia: Secondary | ICD-10-CM | POA: Diagnosis not present

## 2019-10-18 DIAGNOSIS — E871 Hypo-osmolality and hyponatremia: Secondary | ICD-10-CM | POA: Diagnosis not present

## 2019-10-18 DIAGNOSIS — G894 Chronic pain syndrome: Secondary | ICD-10-CM | POA: Diagnosis not present

## 2019-10-18 DIAGNOSIS — R509 Fever, unspecified: Secondary | ICD-10-CM | POA: Diagnosis not present

## 2019-10-18 MED ORDER — ALBUTEROL SULFATE (2.5 MG/3ML) 0.083% IN NEBU: 2.5000 mg | INHALATION_SOLUTION | Freq: Four times a day (QID) | RESPIRATORY_TRACT | 2 refills | Status: DC | PRN

## 2019-10-18 MED ORDER — ALBUTEROL SULFATE HFA 108 (90 BASE) MCG/ACT IN AERS: INHALATION_SPRAY | RESPIRATORY_TRACT | 3 refills | Status: DC

## 2019-10-18 MED ORDER — BENZONATATE 100 MG PO CAPS: 100.0000 mg | ORAL_CAPSULE | Freq: Three times a day (TID) | ORAL | 0 refills | Status: DC | PRN

## 2019-10-18 NOTE — Progress Notes (Signed)
I have discussed the procedure for the virtual visit with the patient who has given consent to proceed with assessment and treatment.   Daniel Durham, CMA     

## 2019-10-18 NOTE — Patient Instructions (Signed)
Instructions sent to MyChart.   Please keep well-hydrated and try to get plenty of rest. Continue albuterol as directed. Take the Tessalon as directed for cough. Consider starting plain Mucinex twice daily. If you have a humidifier, run 1 in the bedroom at night. Please go tomorrow for Covid testing as scheduled. Quarantine until test results are in. Tylenol if needed for fever and aches.  If you notice any worsening of shortness of breath or development of any chest pain, please call 911 or have someone take you to nearest ER (they need to be masked to protect themselves).

## 2019-10-18 NOTE — Progress Notes (Signed)
Virtual Visit via Video   I connected with patient on 10/18/19 at 11:30 AM EST by a video enabled telemedicine application and verified that I am speaking with the correct person using two identifiers.  Location patient: Home Location provider: [facility], Office Persons participating in the virtual visit: Patient, Provider, [place] (Patina Moore)  I discussed the limitations of evaluation and management by telemedicine and the availability of in person appointments. The patient expressed understanding and agreed to proceed.  Subjective:   HPI:   Patient presents via doxy doxy.me today complaining of 6 days of worsening URI symptoms.  Patient endorses mild nasal congestion, dry cough and wheezing at onset of symptoms.  Over the weekend developed significant chills, subjective fever, Head To Toe body aches, fatigue and loss of taste.  Denies significant chest congestion but does note chest tightness, dyspnea with exertion and some wheezing.  Has been keeping hydrated and staying in bed.  Is using his albuterol nebulizer machine at home which helps.  Denies recent travel.  Significant other with similar symptoms starting before him.  They are both scheduled to get Covid tested in the morning.  ROS:   See pertinent positives and negatives per HPI.  Patient Active Problem List   Diagnosis Date Noted  . Colon cancer screening 05/25/2018  . Anxiety and depression 03/03/2017  . Cervical disc disorder with radiculopathy of cervical region 07/24/2015  . 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 Visit for preventive health examination 02/04/2015  . Chronic back pain greater than 3 months duration 01/23/2015  . 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 Amputation stump complication ([place]) [id number]    Social History   Tobacco Use  . Smoking status: Never Smoker  . Smokeless  tobacco: Never Used  Substance Use Topics  . Alcohol use: Yes    Alcohol/week: 0.0 standard drinks    Comment: occ    Current Outpatient Medications:  .  albuterol (PROVENTIL) (2.5 MG/3ML) 0.083% nebulizer solution, Take 3 mLs (2.5 mg total) by nebulization every 6 (six) hours as needed for wheezing or shortness of breath., Disp: 180 mL, Rfl: 2 .  albuterol (VENTOLIN HFA) 108 (90 Base) MCG/ACT inhaler, INHALE 2 PUFFS BY MOUTH EVERY 6 HOURS AS NEEDED FOR WHEEZING, Disp: 18 g, Rfl: 3 .  ALPRAZolam (XANAX) 0.25 MG tablet, Take 1 tablet (0.25 mg total) by mouth 2 (two) times daily as needed for anxiety., Disp: 30 tablet, Rfl: 0 .  atorvastatin (LIPITOR) 10 MG tablet, TAKE 1 TABLET(10 MG) BY MOUTH DAILY, Disp: 90 tablet, Rfl: 1 .  gabapentin (NEURONTIN) 600 MG tablet, Take 1 tablet (600 mg total) by mouth 3 (three) times daily., Disp: 270 tablet, Rfl: 1 .  hydrOXYzine (ATARAX/VISTARIL) 25 MG tablet, Take 1 tablet (25 mg total) by mouth 3 (three) times daily as needed. (Patient taking differently: Take 25 mg by mouth 3 (three) times daily as needed for anxiety. ), Disp: 30 tablet, Rfl: 0 .  Ivermectin (SKLICE) 0.5 % LOTN, Apply to dry hair to coat hair and scalp.  Leave on for 10 minutes and then rinse, Disp: 117 g, Rfl: 0 .  lisinopril (ZESTRIL) 20 MG tablet, TAKE 1 TABLET(20 MG) BY MOUTH DAILY, Disp: 90 tablet, Rfl: 1 .  montelukast (SINGULAIR) 10 MG tablet, TAKE 1 TABLET(10 MG) BY MOUTH AT BEDTIME, Disp: 90 tablet, Rfl: 1 .  Multiple Vitamins-Minerals (HM MULTIVITAMIN ADULT GUMMY) CHEW, Chew  2 tablets by mouth daily. , Disp: , Rfl:  .  oxyCODONE-acetaminophen (PERCOCET) 10-325 MG tablet, TK 1 T PO Q 6 H PRN, Disp: 120 tablet, Rfl: 0 .  sertraline (ZOLOFT) 50 MG tablet, Take 1 tablet (50 mg total) by mouth daily., Disp: 90 tablet, Rfl: 1 .  tiZANidine (ZANAFLEX) 2 MG tablet, Take 2 mg by mouth every 8 (eight) hours as needed for muscle spasms. , Disp: , Rfl: 1 .  benzonatate (TESSALON) 100 MG  capsule, Take 1 capsule (100 mg total) by mouth 3 (three) times daily as needed for cough., Disp: 30 capsule, Rfl: 0  Allergies  Allergen Reactions  . Aspirin Swelling  . Hydrocodone Itching    Objective:   There were no vitals taken for this visit.  Patient is well-developed, well-nourished in no acute distress.  Resting comfortably at home in bed. Head is normocephalic, atraumatic.  No labored breathing or wheezing noted on video exam. Speech is clear and coherent with logical content.  Patient is alert and oriented at baseline.   Assessment and Plan:  1. Suspected COVID-19 virus infection High suspicion for Covid.  Thankfully patient has no labored breathing at present.  We will have him continue albuterol neb solution.  Will refill albuterol MDI as well to have on hand.  Continue hydration, rest.  Recommend plain Mucinex.  Rx Tessalon for cough.  We will enroll patient in my chart Covid symptom monitoring program.  He is to go as scheduled for Covid testing.  He is to quarantine until test results are in.  Strict ER precautions reviewed with patient.  Given his history, have very low threshold for ER evaluation.  Patient voiced understanding and agreement with plan.  Will send copy of instructions to patient my chart. - albuterol (PROVENTIL) (2.5 MG/3ML) 0.083% nebulizer solution; Take 3 mLs (2.5 mg total) by nebulization every 6 (six) hours as needed for wheezing or shortness of breath.  Dispense: 180 mL; Refill: 2 - albuterol (VENTOLIN HFA) 108 (90 Base) MCG/ACT inhaler; INHALE 2 PUFFS BY MOUTH EVERY 6 HOURS AS NEEDED FOR WHEEZING  Dispense: 18 g; Refill: 3 - benzonatate (TESSALON) 100 MG capsule; Take 1 capsule (100 mg total) by mouth 3 (three) times daily as needed for cough.  Dispense: 30 capsule; Refill: 0    Leeanne Rio, [place] 10/18/2019

## 2019-10-19 ENCOUNTER — Other Ambulatory Visit: Payer: Medicare HMO

## 2019-10-19 ENCOUNTER — Ambulatory Visit: Payer: Medicare HMO | Attending: Physician Assistant

## 2019-10-19 DIAGNOSIS — U071 COVID-19: Secondary | ICD-10-CM | POA: Diagnosis not present

## 2019-10-19 DIAGNOSIS — G894 Chronic pain syndrome: Secondary | ICD-10-CM | POA: Diagnosis not present

## 2019-10-19 DIAGNOSIS — J9601 Acute respiratory failure with hypoxia: Secondary | ICD-10-CM | POA: Diagnosis not present

## 2019-10-19 DIAGNOSIS — J1289 Other viral pneumonia: Secondary | ICD-10-CM | POA: Diagnosis not present

## 2019-11-07 ENCOUNTER — Encounter: Payer: Self-pay | Admitting: Physician Assistant

## 2019-11-07 ENCOUNTER — Other Ambulatory Visit: Payer: Self-pay

## 2019-11-07 ENCOUNTER — Ambulatory Visit (INDEPENDENT_AMBULATORY_CARE_PROVIDER_SITE_OTHER): Payer: Medicare HMO | Admitting: Physician Assistant

## 2019-11-07 DIAGNOSIS — R509 Fever, unspecified: Secondary | ICD-10-CM | POA: Diagnosis not present

## 2019-11-07 DIAGNOSIS — U071 COVID-19: Secondary | ICD-10-CM

## 2019-11-07 DIAGNOSIS — R0781 Pleurodynia: Secondary | ICD-10-CM | POA: Diagnosis not present

## 2019-11-07 DIAGNOSIS — R0602 Shortness of breath: Secondary | ICD-10-CM | POA: Diagnosis not present

## 2019-11-07 DIAGNOSIS — J9600 Acute respiratory failure, unspecified whether with hypoxia or hypercapnia: Secondary | ICD-10-CM

## 2019-11-07 DIAGNOSIS — R42 Dizziness and giddiness: Secondary | ICD-10-CM | POA: Diagnosis not present

## 2019-11-07 DIAGNOSIS — R079 Chest pain, unspecified: Secondary | ICD-10-CM | POA: Diagnosis not present

## 2019-11-07 NOTE — Progress Notes (Signed)
Virtual Visit via Video   I connected with patient on 11/07/19 at 11:00 AM EST by a video enabled telemedicine application and verified that I am speaking with the correct person using two identifiers.  Location patient: Home Location provider: [facility], Office Persons participating in the virtual visit: Patient, Provider, [place] (Patina Moore)  I discussed the limitations of evaluation and management by telemedicine and the availability of in person appointments. The patient expressed understanding and agreed to proceed.  Subjective:   HPI:  Patient presents via doxy.need today for hospital follow-up.  Records were available in care everywhere and have been reviewed. Patient presented to the emergency department at [facility] on 10/18/2019 with worsening SOB and URI symptoms.  ER work-up revealed COVID, pneumonia and hypoxia. .  Patient subsequently admitted to the hospital for further assessment and management.  During hospitalization, patient kept on Oxygen. Was given IV antivirals, steroids and antibiotics. Transitioned to oral Azitrhomycin..  Patient was weaned completely off of oxygen by 10/28/2019.  Was felt stable for discharge home on 10/29/2019 with close PCP follow-up.  Since discharge, patient endorses still having significant SOB, somewhat at rest and with any exertion. Becomes lightheaded and dizzy with standing and any movement. Has only been laying and bed and getting up only to use the restroom. Has been hydrating and eating. Notes newer onset significant chest pain, mainly L sided with breathing. Also has a sensation of swelling in the L side of his neck. Is not sure why he is getting worse at this point instead of better. Does not have a pulse ox at home to check O2 level but feels it is quite low.   ROS:   See pertinent positives and negatives per HPI.  Patient Active Problem List   Diagnosis Date Noted  . Colon cancer screening 05/25/2018  .  Anxiety and depression 03/03/2017  . Cervical disc disorder with radiculopathy of cervical region 07/24/2015  . 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 Visit for preventive health examination 02/04/2015  . Chronic back pain greater than 3 months duration 01/23/2015  . 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 Smoking status: Never Smoker  . Smokeless tobacco: Never Used  Substance Use Topics  . Alcohol use: Yes    Alcohol/week: 0.0 standard drinks    Comment: occ    Current Outpatient Medications:  .  albuterol (PROVENTIL) (2.5 MG/3ML) 0.083% nebulizer solution, Take 3 mLs (2.5 mg total) by nebulization every 6 (six) hours as needed for wheezing or shortness of breath., Disp: 180 mL, Rfl: 2 .  albuterol (VENTOLIN HFA) 108 (90 Base) MCG/ACT inhaler, INHALE 2 PUFFS BY MOUTH EVERY 6 HOURS AS NEEDED FOR WHEEZING, Disp: 18 g, Rfl: 3 .  ALPRAZolam (XANAX) 0.25 MG tablet, Take 1 tablet (0.25 mg total) by mouth 2 (two) times daily as needed for anxiety., Disp: 30 tablet, Rfl: 0 .  atorvastatin (LIPITOR) 10 MG tablet, TAKE 1 TABLET(10 MG) BY MOUTH DAILY, Disp: 90 tablet, Rfl: 1 .  benzonatate (TESSALON) 100 MG capsule, Take 1 capsule (100 mg total) by mouth 3 (three) times daily as needed for cough., Disp: 30 capsule, Rfl: 0 .  gabapentin (NEURONTIN) 600 MG tablet, Take 1 tablet (600 mg total) by mouth 3 (three) times daily., Disp: 270 tablet, Rfl: 1 .  hydrOXYzine (  ATARAX/VISTARIL) 25 MG tablet, Take 1 tablet (25 mg total) by mouth 3 (three) times daily as needed. (Patient taking differently: Take 25 mg by mouth 3 (three) times daily as needed for anxiety. ), Disp: 30 tablet, Rfl: 0 .  Ivermectin (SKLICE) 0.5 % LOTN, Apply to dry hair to coat hair and scalp.  Leave on for 10 minutes and then rinse, Disp: 117  g, Rfl: 0 .  lisinopril (ZESTRIL) 20 MG tablet, TAKE 1 TABLET(20 MG) BY MOUTH DAILY, Disp: 90 tablet, Rfl: 1 .  montelukast (SINGULAIR) 10 MG tablet, TAKE 1 TABLET(10 MG) BY MOUTH AT BEDTIME, Disp: 90 tablet, Rfl: 1 .  Multiple Vitamins-Minerals (HM MULTIVITAMIN ADULT GUMMY) CHEW, Chew 2 tablets by mouth daily. , Disp: , Rfl:  .  oxyCODONE-acetaminophen (PERCOCET) 10-325 MG tablet, TK 1 T PO Q 6 H PRN, Disp: 120 tablet, Rfl: 0 .  sertraline (ZOLOFT) 50 MG tablet, Take 1 tablet (50 mg total) by mouth daily., Disp: 90 tablet, Rfl: 1 .  tiZANidine (ZANAFLEX) 2 MG tablet, Take 2 mg by mouth every 8 (eight) hours as needed for muscle spasms. , Disp: , Rfl: 1  Allergies  Allergen Reactions  . Aspirin Swelling  . Hydrocodone Itching    Objective:   There were no vitals taken for this visit.  Patient is well-developed, well-nourished in some mild distress. Resting on bed at home. Looks uncomfortable. Head is normocephalic, atraumatic.  Labored breathing noted. Unable to complete sentences without becoming quite winded. Speech is clear and coherent with logical content.  Patient is alert and oriented at baseline.   Assessment and Plan:   1. Acute respiratory failure, unspecified whether with hypoxia or hypercapnia ([place]) 2. Pleuritic chest pain 3. COVID-19 S/p 10+ day at [facility]). Now worsening SOB with new onset pleuritic chest pain x several days. Unable to assess oxygenation at home. Needs ER assessment ASAP. Declines EMS but a family member there is taking him to our ER for emergent assessment. Concern for recurrent respiratory failure, PE, etc.  .   Leeanne Rio, PA-C 11/07/2019

## 2019-11-07 NOTE — Progress Notes (Signed)
I have discussed the procedure for the virtual visit with the patient who has given consent to proceed with assessment and treatment.   Shaunice Levitan S Lennard Capek, CMA     

## 2019-11-17 ENCOUNTER — Other Ambulatory Visit: Payer: Self-pay

## 2019-11-17 ENCOUNTER — Inpatient Hospital Stay (HOSPITAL_COMMUNITY)
Admission: EM | Admit: 2019-11-17 | Discharge: 2019-11-21 | DRG: 189 | Disposition: A | Discharge status: Home / Self Care | Payer: Medicare HMO | Attending: Internal Medicine | Admitting: Internal Medicine

## 2019-11-17 ENCOUNTER — Telehealth: Payer: Self-pay | Admitting: Physician Assistant

## 2019-11-17 ENCOUNTER — Encounter: Payer: Self-pay | Admitting: Physician Assistant

## 2019-11-17 ENCOUNTER — Observation Stay (HOSPITAL_COMMUNITY): Payer: Medicare HMO

## 2019-11-17 ENCOUNTER — Other Ambulatory Visit: Payer: Self-pay | Admitting: Physician Assistant

## 2019-11-17 ENCOUNTER — Encounter (HOSPITAL_COMMUNITY): Payer: Self-pay | Admitting: Emergency Medicine

## 2019-11-17 ENCOUNTER — Emergency Department (HOSPITAL_COMMUNITY): Payer: Medicare HMO

## 2019-11-17 DIAGNOSIS — Z833 Family history of diabetes mellitus: Secondary | ICD-10-CM | POA: Diagnosis not present

## 2019-11-17 DIAGNOSIS — E785 Hyperlipidemia, unspecified: Secondary | ICD-10-CM | POA: Diagnosis present

## 2019-11-17 DIAGNOSIS — F329 Major depressive disorder, single episode, unspecified: Secondary | ICD-10-CM | POA: Diagnosis not present

## 2019-11-17 DIAGNOSIS — F419 Anxiety disorder, unspecified: Secondary | ICD-10-CM

## 2019-11-17 DIAGNOSIS — M549 Dorsalgia, unspecified: Secondary | ICD-10-CM

## 2019-11-17 DIAGNOSIS — R0902 Hypoxemia: Secondary | ICD-10-CM | POA: Diagnosis not present

## 2019-11-17 DIAGNOSIS — Z8249 Family history of ischemic heart disease and other diseases of the circulatory system: Secondary | ICD-10-CM

## 2019-11-17 DIAGNOSIS — R000 Tachycardia, unspecified: Secondary | ICD-10-CM | POA: Diagnosis not present

## 2019-11-17 DIAGNOSIS — R7401 Elevation of levels of liver transaminase levels: Secondary | ICD-10-CM | POA: Diagnosis present

## 2019-11-17 DIAGNOSIS — J9601 Acute respiratory failure with hypoxia: Principal | ICD-10-CM | POA: Diagnosis present

## 2019-11-17 DIAGNOSIS — M5010 Cervical disc disorder with radiculopathy, unspecified cervical region: Secondary | ICD-10-CM

## 2019-11-17 DIAGNOSIS — M545 Low back pain: Secondary | ICD-10-CM | POA: Diagnosis not present

## 2019-11-17 DIAGNOSIS — I10 Essential (primary) hypertension: Secondary | ICD-10-CM | POA: Diagnosis not present

## 2019-11-17 DIAGNOSIS — Z6841 Body Mass Index (BMI) 40.0 and over, adult: Secondary | ICD-10-CM

## 2019-11-17 DIAGNOSIS — G8929 Other chronic pain: Secondary | ICD-10-CM

## 2019-11-17 DIAGNOSIS — F32A Depression, unspecified: Secondary | ICD-10-CM | POA: Diagnosis present

## 2019-11-17 DIAGNOSIS — E669 Obesity, unspecified: Secondary | ICD-10-CM | POA: Diagnosis present

## 2019-11-17 DIAGNOSIS — U071 COVID-19: Secondary | ICD-10-CM | POA: Diagnosis not present

## 2019-11-17 DIAGNOSIS — Z825 Family history of asthma and other chronic lower respiratory diseases: Secondary | ICD-10-CM

## 2019-11-17 DIAGNOSIS — B948 Sequelae of other specified infectious and parasitic diseases: Secondary | ICD-10-CM

## 2019-11-17 DIAGNOSIS — J45901 Unspecified asthma with (acute) exacerbation: Secondary | ICD-10-CM | POA: Diagnosis present

## 2019-11-17 DIAGNOSIS — R230 Cyanosis: Secondary | ICD-10-CM | POA: Diagnosis present

## 2019-11-17 DIAGNOSIS — R0602 Shortness of breath: Secondary | ICD-10-CM | POA: Diagnosis not present

## 2019-11-17 LAB — D-DIMER, QUANTITATIVE: D-Dimer, Quant: 0.69 ug/mL-FEU — ABNORMAL HIGH (ref 0.00–0.50)

## 2019-11-17 LAB — CBC
HCT: 47.4 % (ref 39.0–52.0)
Hemoglobin: 15.8 g/dL (ref 13.0–17.0)
MCH: 33.6 pg (ref 26.0–34.0)
MCHC: 33.3 g/dL (ref 30.0–36.0)
MCV: 100.9 fL — ABNORMAL HIGH (ref 80.0–100.0)
Platelets: 235 10*3/uL (ref 150–400)
RBC: 4.7 MIL/uL (ref 4.22–5.81)
RDW: 12.7 % (ref 11.5–15.5)
WBC: 7.9 10*3/uL (ref 4.0–10.5)
nRBC: 0 % (ref 0.0–0.2)

## 2019-11-17 LAB — PROCALCITONIN: Procalcitonin: <0.1 ng/mL

## 2019-11-17 LAB — BASIC METABOLIC PANEL
Anion gap: 15 (ref 5–15)
BUN: 8 mg/dL (ref 6–20)
CO2: 23 mmol/L (ref 22–32)
Calcium: 9.4 mg/dL (ref 8.9–10.3)
Chloride: 104 mmol/L (ref 98–111)
Creatinine, Ser: 1.08 mg/dL (ref 0.61–1.24)
GFR calc Af Amer: >60 mL/min (ref >60)
GFR calc non Af Amer: >60 mL/min (ref >60)
Glucose, Bld: 138 mg/dL — ABNORMAL HIGH (ref 70–99)
Potassium: 4 mmol/L (ref 3.5–5.1)
Sodium: 142 mmol/L (ref 135–145)

## 2019-11-17 LAB — HEPATIC FUNCTION PANEL
ALT: 65 U/L — ABNORMAL HIGH (ref 0–44)
AST: 44 U/L — ABNORMAL HIGH (ref 15–41)
Albumin: 3.6 g/dL (ref 3.5–5.0)
Alkaline Phosphatase: 84 U/L (ref 38–126)
Bilirubin, Direct: 0.1 mg/dL (ref 0.0–0.2)
Indirect Bilirubin: 0.6 mg/dL (ref 0.3–0.9)
Total Bilirubin: 0.7 mg/dL (ref 0.3–1.2)
Total Protein: 7.2 g/dL (ref 6.5–8.1)

## 2019-11-17 LAB — APTT: aPTT: 30 seconds (ref 24–36)

## 2019-11-17 LAB — BRAIN NATRIURETIC PEPTIDE: B Natriuretic Peptide: 16.2 pg/mL (ref 0.0–100.0)

## 2019-11-17 LAB — POC SARS CORONAVIRUS 2 AG -  ED: SARS Coronavirus 2 Ag: NEGATIVE

## 2019-11-17 LAB — FIBRINOGEN: Fibrinogen: 572 mg/dL — ABNORMAL HIGH (ref 210–475)

## 2019-11-17 LAB — LACTATE DEHYDROGENASE: LDH: 221 U/L — ABNORMAL HIGH (ref 98–192)

## 2019-11-17 LAB — ABO/RH: ABO/RH(D): O NEG

## 2019-11-17 LAB — TROPONIN I (HIGH SENSITIVITY)
Troponin I (High Sensitivity): 4 ng/L (ref <18)
Troponin I (High Sensitivity): 3 ng/L (ref <18)

## 2019-11-17 LAB — PROTIME-INR
INR: 1 (ref 0.8–1.2)
Prothrombin Time: 12.9 seconds (ref 11.4–15.2)

## 2019-11-17 LAB — TRIGLYCERIDES: Triglycerides: 290 mg/dL — ABNORMAL HIGH (ref <150)

## 2019-11-17 LAB — C-REACTIVE PROTEIN: CRP: 1.1 mg/dL — ABNORMAL HIGH (ref <1.0)

## 2019-11-17 LAB — FERRITIN: Ferritin: 415 ng/mL — ABNORMAL HIGH (ref 24–336)

## 2019-11-17 MED ORDER — ASCORBIC ACID 500 MG PO TABS
500.0000 mg | ORAL_TABLET | Freq: Every day | ORAL | Status: DC
  Administered 2019-11-17 – 2019-11-21 (×5): 500 mg via ORAL
  Filled 2019-11-17 (×5): qty 1

## 2019-11-17 MED ORDER — ENOXAPARIN SODIUM 60 MG/0.6ML ~~LOC~~ SOLN
55.0000 mg | SUBCUTANEOUS | Status: DC
  Administered 2019-11-17 – 2019-11-20 (×4): 55 mg via SUBCUTANEOUS
  Filled 2019-11-17 (×3): qty 0.55
  Filled 2019-11-17: qty 0.6
  Filled 2019-11-17 (×2): qty 0.55

## 2019-11-17 MED ORDER — ALBUTEROL SULFATE (2.5 MG/3ML) 0.083% IN NEBU
2.5000 mg | INHALATION_SOLUTION | RESPIRATORY_TRACT | Status: DC | PRN
  Administered 2019-11-18: 05:00:00 2.5 mg via RESPIRATORY_TRACT
  Filled 2019-11-17: qty 3

## 2019-11-17 MED ORDER — GUAIFENESIN-DM 100-10 MG/5ML PO SYRP: 10.0000 mL | ORAL_SOLUTION | ORAL | Status: DC | PRN

## 2019-11-17 MED ORDER — GABAPENTIN 600 MG PO TABS
600.0000 mg | ORAL_TABLET | Freq: Once | ORAL | Status: AC
  Administered 2019-11-17: 600 mg via ORAL
  Filled 2019-11-17: qty 1

## 2019-11-17 MED ORDER — ALPRAZOLAM 0.25 MG PO TABS
0.2500 mg | ORAL_TABLET | Freq: Once | ORAL | Status: AC
  Administered 2019-11-17: 20:00:00 0.25 mg via ORAL
  Filled 2019-11-17: qty 1

## 2019-11-17 MED ORDER — IPRATROPIUM-ALBUTEROL 0.5-2.5 (3) MG/3ML IN SOLN: 3.0000 mL | RESPIRATORY_TRACT | Status: DC | PRN

## 2019-11-17 MED ORDER — ALPRAZOLAM 0.25 MG PO TABS: 0.2500 mg | ORAL_TABLET | Freq: Two times a day (BID) | ORAL | 0 refills | Status: DC | PRN

## 2019-11-17 MED ORDER — ZINC SULFATE 220 (50 ZN) MG PO CAPS
220.0000 mg | ORAL_CAPSULE | Freq: Every day | ORAL | Status: DC
  Administered 2019-11-17 – 2019-11-21 (×5): 220 mg via ORAL
  Filled 2019-11-17 (×5): qty 1

## 2019-11-17 MED ORDER — METHYLPREDNISOLONE SODIUM SUCC 125 MG IJ SOLR
60.0000 mg | Freq: Four times a day (QID) | INTRAMUSCULAR | Status: DC
  Administered 2019-11-18 – 2019-11-20 (×9): 60 mg via INTRAVENOUS
  Filled 2019-11-17 (×9): qty 2

## 2019-11-17 MED ORDER — SODIUM CHLORIDE 0.9% FLUSH
3.0000 mL | Freq: Once | INTRAVENOUS | Status: AC
  Administered 2019-11-17: 18:00:00 3 mL via INTRAVENOUS

## 2019-11-17 MED ORDER — IPRATROPIUM-ALBUTEROL 0.5-2.5 (3) MG/3ML IN SOLN
3.0000 mL | Freq: Four times a day (QID) | RESPIRATORY_TRACT | Status: DC
  Administered 2019-11-17 – 2019-11-18 (×4): 3 mL via RESPIRATORY_TRACT
  Filled 2019-11-17 (×4): qty 3

## 2019-11-17 MED ORDER — OXYCODONE-ACETAMINOPHEN 10-325 MG PO TABS: 1.0000 | ORAL_TABLET | Freq: Once | ORAL | Status: DC

## 2019-11-17 MED ORDER — BENZONATATE 100 MG PO CAPS
100.0000 mg | ORAL_CAPSULE | Freq: Three times a day (TID) | ORAL | Status: DC | PRN
  Administered 2019-11-18 – 2019-11-19 (×2): 100 mg via ORAL
  Filled 2019-11-17 (×2): qty 1

## 2019-11-17 MED ORDER — BUDESONIDE 0.25 MG/2ML IN SUSP
0.2500 mg | Freq: Two times a day (BID) | RESPIRATORY_TRACT | Status: DC
  Administered 2019-11-17: 23:00:00 0.25 mg via RESPIRATORY_TRACT
  Filled 2019-11-17 (×2): qty 2

## 2019-11-17 MED ORDER — OXYCODONE HCL 5 MG PO TABS
5.0000 mg | ORAL_TABLET | Freq: Once | ORAL | Status: AC
  Administered 2019-11-17: 20:00:00 5 mg via ORAL
  Filled 2019-11-17: qty 1

## 2019-11-17 MED ORDER — ALBUTEROL SULFATE HFA 108 (90 BASE) MCG/ACT IN AERS
4.0000 | INHALATION_SPRAY | Freq: Once | RESPIRATORY_TRACT | Status: AC
  Administered 2019-11-17: 18:00:00 4 via RESPIRATORY_TRACT
  Filled 2019-11-17: qty 6.7

## 2019-11-17 MED ORDER — SODIUM CHLORIDE 0.9 % IV BOLUS
500.0000 mL | Freq: Once | INTRAVENOUS | Status: AC
  Administered 2019-11-17: 18:00:00 500 mL via INTRAVENOUS

## 2019-11-17 MED ORDER — GUAIFENESIN-CODEINE 100-10 MG/5ML PO SOLN
5.0000 mL | ORAL | Status: DC | PRN
  Administered 2019-11-18 – 2019-11-21 (×10): 5 mL via ORAL
  Filled 2019-11-17 (×10): qty 5

## 2019-11-17 MED ORDER — OXYCODONE-ACETAMINOPHEN 10-325 MG PO TABS: ORAL_TABLET | ORAL | 0 refills | Status: DC

## 2019-11-17 MED ORDER — ALBUTEROL SULFATE HFA 108 (90 BASE) MCG/ACT IN AERS: 2.0000 | INHALATION_SPRAY | Freq: Four times a day (QID) | RESPIRATORY_TRACT | Status: DC | PRN

## 2019-11-17 MED ORDER — OXYCODONE-ACETAMINOPHEN 5-325 MG PO TABS
1.0000 | ORAL_TABLET | Freq: Once | ORAL | Status: AC
  Administered 2019-11-17: 20:00:00 1 via ORAL
  Filled 2019-11-17 (×2): qty 1

## 2019-11-17 MED ORDER — ACETAMINOPHEN 325 MG PO TABS: 650.0000 mg | ORAL_TABLET | Freq: Four times a day (QID) | ORAL | Status: DC | PRN

## 2019-11-17 MED ORDER — METHYLPREDNISOLONE SODIUM SUCC 125 MG IJ SOLR
125.0000 mg | Freq: Once | INTRAMUSCULAR | Status: AC
  Administered 2019-11-17: 23:00:00 125 mg via INTRAVENOUS
  Filled 2019-11-17: qty 2

## 2019-11-17 MED ORDER — IOHEXOL 350 MG/ML SOLN
75.0000 mL | Freq: Once | INTRAVENOUS | Status: AC | PRN
  Administered 2019-11-17: 21:00:00 75 mL via INTRAVENOUS

## 2019-11-17 NOTE — Telephone Encounter (Signed)
Noted. Patient in ER at present. No notes yet. Will monitor.  Medications refilled since he is due. PDMP reviewed.

## 2019-11-17 NOTE — ED Provider Notes (Addendum)
[facility] EMERGENCY DEPARTMENT Provider Note   CSN: [id number] Arrival date & time: 11/17/19  1441     History Chief Complaint  Patient presents with  . Chest Pain  . Shortness of Breath    Daniel Durham is a 54 y.o. male.  HPI 54 year old male presents with shortness of breath.  He was diagnosed with the Covid-19 December 15.  Was admitted to the hospital at [facility].  Was discharged on December 26 but states he thinks he should not of been discharged.  Has continued to have shortness of breath, cough.  Shortness of breath is not that bad at rest but he can barely walk without getting lightheaded and more short of breath.  He is still coughing but no blood.  No leg swelling.  He has also noticed that his upper extremities seem to have a blue discoloration that has been ongoing since he left.  Has been having sharp chest pain during this entire time.  Has been using albuterol and took medicine such as steroids with no relief.   Past Medical History:  Diagnosis Date  . Allergy   . Anxiety   . Asthma    SEASONAL   . Blood transfusion without reported diagnosis   . Chronic back pain    Neurostimulator  . Depression   . Environmental allergies   . Hypertension   . Neuromuscular disorder ([place])   . Neuropathy   . Seasonal allergic conjunctivitis     Patient Active Problem List   Diagnosis Date Noted  . Acute respiratory failure with hypoxia ([place]) 11/17/2019  . Colon cancer screening 05/25/2018  . Anxiety and depression 03/03/2017  . Cervical disc disorder with radiculopathy of cervical region 07/24/2015  . 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 Visit for preventive health examination 02/04/2015  . Chronic back pain greater than 3 months duration 01/23/2015  . 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 Amputation stump complication  ([place]) [id number]    Past Surgical History:  Procedure Laterality Date  . BACK SURGERY    . CARDIAC CATHETERIZATION     11/05/10 [facility], [place]): [place] of mLAD, no sign disease. EF 45% in RAO, 60% LAO (51% stress, 54% rest by NM stress; 57-59% echo 10/2010)  . CARPAL TUNNEL RELEASE     Bilateral  . CERVICAL DISCECTOMY  12/17/2017   titanium plates 12-17-17  . COLONOSCOPY    . HAND AMPUTATION  2007  . POLYPECTOMY     pt states had colon in [place] and he had polyps- he has no idea what type   . ROTATOR CUFF REPAIR     Left  . SPINAL CORD STIMULATOR IMPLANT    . SPINAL CORD STIMULATOR REMOVAL N/A 10/26/2015   Procedure: LUMBAR SPINAL CORD STIMULATOR REMOVAL;  Surgeon: Clydell Hakim, MD;  Location: [facility];  Service: Neurosurgery;  Laterality: N/A;       Family History  Problem Relation Age of Onset  . Diabetes Mother        Living  . Sleep apnea Mother   . Diabetes Father 35       Deceased  . Hypertension Father   . Jaundice Father   . Hemophilia Father   . Alcoholism Father   . Cancer Maternal Grandfather        Throat  . Esophageal cancer Maternal Grandfather   . Cancer Paternal Uncle  Throat  . Esophageal cancer Paternal Uncle   . Asthma Sister   . Diabetes Sister   . Healthy Son        X1  . Healthy Daughter        X1  . Colon polyps Neg Hx   . Colon cancer Neg Hx   . Rectal cancer Neg Hx   . Stomach cancer Neg Hx     Social History   Tobacco Use  . Smoking status: Never Smoker  . Smokeless tobacco: Never Used  Substance Use Topics  . Alcohol use: Yes    Alcohol/week: 0.0 standard drinks    Comment: occ  . Drug use: No    Home Medications Prior to Admission medications   Medication Sig Start Date End Date Taking? Authorizing Provider  albuterol (PROVENTIL) (2.5 MG/3ML) 0.083% nebulizer solution Take 3 mLs (2.5 mg total) by nebulization every 6 (six) hours as needed for wheezing or shortness of breath. 10/18/19   Brunetta Jeans, PA-C  albuterol (VENTOLIN HFA) 108 (90 Base) MCG/ACT inhaler INHALE 2 PUFFS BY MOUTH EVERY 6 HOURS AS NEEDED FOR WHEEZING 10/18/19   Brunetta Jeans, PA-C  ALPRAZolam Duanne Moron) 0.25 MG tablet Take 1 tablet (0.25 mg total) by mouth 2 (two) times daily as needed for anxiety. 11/17/19   Brunetta Jeans, PA-C  atorvastatin (LIPITOR) 10 MG tablet TAKE 1 TABLET(10 MG) BY MOUTH DAILY 09/23/19   Brunetta Jeans, PA-C  cyclobenzaprine (FLEXERIL) 10 MG tablet Take by mouth.    [provider]  furosemide (LASIX) 20 MG tablet Take by mouth. 10/29/19   [provider]  gabapentin (NEURONTIN) 600 MG tablet Take 1 tablet (600 mg total) by mouth 3 (three) times daily. 06/06/19   Brunetta Jeans, PA-C  guaiFENesin (MUCINEX) 600 MG 12 hr tablet Take by mouth. 10/29/19   [provider]  hydrOXYzine (ATARAX/VISTARIL) 25 MG tablet Take 1 tablet (25 mg total) by mouth 3 (three) times daily as needed. Patient taking differently: Take 25 mg by mouth 3 (three) times daily as needed for anxiety.  09/23/18   Brunetta Jeans, PA-C  Ivermectin (SKLICE) 0.5 % LOTN Apply to dry hair to coat hair and scalp.  Leave on for 10 minutes and then rinse 07/07/19   Midge Minium, MD  lisinopril (ZESTRIL) 20 MG tablet TAKE 1 TABLET(20 MG) BY MOUTH DAILY 05/27/19   Brunetta Jeans, PA-C  montelukast (SINGULAIR) 10 MG tablet TAKE 1 TABLET(10 MG) BY MOUTH AT BEDTIME 10/07/19   Brunetta Jeans, PA-C  Multiple Vitamins-Minerals (HM MULTIVITAMIN ADULT GUMMY) CHEW Chew 2 tablets by mouth daily.     [provider]  oxyCODONE-acetaminophen (PERCOCET) 10-325 MG tablet TK 1 T PO Q 6 H PRN 11/17/19   Brunetta Jeans, PA-C  sertraline (ZOLOFT) 50 MG tablet Take 1 tablet (50 mg total) by mouth daily. 10/17/19   Brunetta Jeans, PA-C  tiZANidine (ZANAFLEX) 2 MG tablet Take 2 mg by mouth every 8 (eight) hours as needed for muscle spasms.     [provider]    Allergies    Aspirin  and Hydrocodone  Review of Systems   Review of Systems  Constitutional: Negative for fever.  Respiratory: Positive for cough and shortness of breath.   Cardiovascular: Positive for chest pain. Negative for leg swelling.  Skin: Positive for color change.  All other systems reviewed and are negative.   Physical Exam Updated Vital Signs BP 130/88   Pulse  93   Temp 98.3 F (36.8 C) (Oral)   Resp (!) 21   SpO2 91%   Physical Exam Vitals and nursing note reviewed.  Constitutional:      General: He is not in acute distress.    Appearance: He is well-developed. He is not ill-appearing or diaphoretic.  HENT:     Head: Normocephalic and atraumatic.     Right Ear: External ear normal.     Left Ear: External ear normal.     Nose: Nose normal.  Eyes:     General:        Right eye: No discharge.        Left eye: No discharge.  Cardiovascular:     Rate and Rhythm: Regular rhythm. Tachycardia present.     Heart sounds: Normal heart sounds.  Pulmonary:     Effort: Tachypnea present. No accessory muscle usage or respiratory distress.     Breath sounds: Normal breath sounds. No wheezing or rales.  Abdominal:     Palpations: Abdomen is soft.     Tenderness: There is no abdominal tenderness.  Musculoskeletal:     Cervical back: Neck supple.     Right lower leg: No edema.     Left lower leg: No edema.     Comments: Left below elbow amputation Left volar forearms and upper arms have a light blue hue to them  Skin:    General: Skin is warm and dry.  Neurological:     Mental Status: He is alert.  Psychiatric:        Mood and Affect: Mood is not anxious.     ED Results / Procedures / Treatments   Labs (all labs ordered are listed, but only abnormal results are displayed) Labs Reviewed  BASIC METABOLIC PANEL - Abnormal; Notable for the following components:      Result Value   Glucose, Bld 138 (*)    All other components within normal limits  CBC - Abnormal; Notable for the  following components:   MCV 100.9 (*)    All other components within normal limits  D-DIMER, QUANTITATIVE (NOT AT [facility]) - Abnormal; Notable for the following components:   D-Dimer, Quant 0.69 (*)    All other components within normal limits  LACTATE DEHYDROGENASE - Abnormal; Notable for the following components:   LDH 221 (*)    All other components within normal limits  FERRITIN - Abnormal; Notable for the following components:   Ferritin 415 (*)    All other components within normal limits  TRIGLYCERIDES - Abnormal; Notable for the following components:   Triglycerides 290 (*)    All other components within normal limits  FIBRINOGEN - Abnormal; Notable for the following components:   Fibrinogen 572 (*)    All other components within normal limits  C-REACTIVE PROTEIN - Abnormal; Notable for the following components:   CRP 1.1 (*)    All other components within normal limits  HEPATIC FUNCTION PANEL - Abnormal; Notable for the following components:   AST 44 (*)    ALT 65 (*)    All other components within normal limits  SARS CORONAVIRUS 2 (TAT 6-24 HRS)  PROCALCITONIN  BRAIN NATRIURETIC PEPTIDE  PROTIME-INR  APTT  POC SARS CORONAVIRUS 2 AG -  ED  ABO/RH  TROPONIN I (HIGH SENSITIVITY)  TROPONIN I (HIGH SENSITIVITY)    EKG EKG Interpretation  Date/Time:  Thursday November 17 2019 14:59:22 EST Ventricular Rate:  119 PR Interval:  120 QRS Duration: 86  QT Interval:  308 QTC Calculation: 433 R Axis:   117 Text Interpretation: Sinus tachycardia Left posterior fascicular block Abnormal ECG No STEMI Confirmed by Octaviano Glow [phone]) on 11/17/2019 4:16:03 PM   Radiology CT Angio Chest PE W and/or Wo Contrast  Result Date: 11/17/2019 CLINICAL DATA:  COVID-19 diagnosis 10/18/2019, shortness of breath with exertion with central chest pain EXAM: CT ANGIOGRAPHY CHEST WITH CONTRAST TECHNIQUE: Multidetector CT imaging of the chest was performed using the standard protocol during  bolus administration of intravenous contrast. Multiplanar CT image reconstructions and MIPs were obtained to evaluate the vascular anatomy. CONTRAST:  46mL OMNIPAQUE IOHEXOL 350 MG/ML SOLN COMPARISON:  Same-day radiograph, CTA 10/21/2019 FINDINGS: Cardiovascular: Satisfactory opacification of the pulmonary arteries. Distal evaluation of the segmental and subsegmental pulmonary arteries is limited due to respiratory motion artifact. No central or lobar filling defects are identified. Central pulmonary arteries are normal caliber. Normal heart size. No pericardial effusion. The aorta is normal caliber. Normal 3 vessel branching of the aortic arch. Proximal great vessels opacify normally. Mediastinum/Nodes: Scattered low-attenuation subcentimeter mediastinal and hilar lymph nodes are preserved nodal architecture are likely reactive. No pathologically enlarged mediastinal, hilar or axillary adenopathy. Thyroid gland and thoracic inlet are free of acute abnormality. No acute abnormality of the trachea or esophagus either. Lungs/Pleura: Multifocal areas of mixed ground-glass and consolidative opacities present throughout the lungs overall slightly decreased in extent from comparison CTA 10/21/2019. Some superimposed areas of interlobular septal thickening. More bandlike areas of opacity reflecting atelectasis or developing scarring. Airways appear diffusely thickened with some scattered secretions. Dependent atelectasis seen posteriorly. No pneumothorax. No effusion. Upper Abdomen: No acute abnormalities present in the visualized portions of the upper abdomen. Partial fatty replacement of the pancreas is noted. Musculoskeletal: No acute osseous abnormality or suspicious osseous lesion. Minimal degenerative changes are present in the spine and shoulders. Cervical fusion hardware and left shoulder rotator cuff surgical anchors are noted. Review of the MIP images confirms the above findings. IMPRESSION: 1. Distal evaluation  of the segmental and subsegmental pulmonary arteries is limited due to respiratory motion artifact. No central or lobar filling defects are identified. 2. Multifocal areas of mixed ground-glass and consolidative opacities present throughout the lungs overall slightly decreased in extent from comparison CTA 10/21/2019. Diffuse bronchial wall thickening with some scattered secretions. Findings are compatible with a multifocal pneumonia only slightly improved from comparison study. 3. Some superimposed areas interlobular septal thickening may reflect edema. 4. Bandlike areas of opacity could reflect atelectasis or developing scarring. Electronically Signed   By: Lovena Le M.D.   On: 11/17/2019 21:20   DG Chest Port 1 View  Result Date: 11/17/2019 CLINICAL DATA:  COVID-19 diagnosed 1 month prior with hospitalization, continued shortness of breath and dyspnea on exertion EXAM: PORTABLE CHEST 1 VIEW COMPARISON:  Radiograph 11/07/2019 FINDINGS: There are persistent areas of mixed interstitial and airspace opacity with a lower lung predominance which are similar to comparison radiograph 11/07/2019. Findings are worrisome for continued infection or possible superinfection. No visible pneumothorax or effusion. Cardiomediastinal contours are similar to prior though partially obscured by overlying opacity. No acute osseous or soft tissue abnormality. Cervical fusion hardware is noted. Left rotator cuff surgical anchors are noted. IMPRESSION: Persistent areas of mixed interstitial and airspace opacity with a lower lung predominance. Findings are worrisome for continued infection or possible superinfection. Electronically Signed   By: Lovena Le M.D.   On: 11/17/2019 18:35    Procedures Procedures (including critical care time)  Medications Ordered in ED  Medications  enoxaparin (LOVENOX) injection 55 mg (has no administration in time range)  albuterol (VENTOLIN HFA) 108 (90 Base) MCG/ACT inhaler 2 puff (has no  administration in time range)  guaiFENesin-dextromethorphan (ROBITUSSIN DM) 100-10 MG/5ML syrup 10 mL (has no administration in time range)  ascorbic acid (VITAMIN C) tablet 500 mg (has no administration in time range)  zinc sulfate capsule 220 mg (has no administration in time range)  acetaminophen (TYLENOL) tablet 650 mg (has no administration in time range)  sodium chloride flush (NS) 0.9 % injection 3 mL (3 mLs Intravenous Given 11/17/19 1809)  albuterol (VENTOLIN HFA) 108 (90 Base) MCG/ACT inhaler 4 puff (4 puffs Inhalation Given 11/17/19 1749)  sodium chloride 0.9 % bolus 500 mL (0 mLs Intravenous Stopped 11/17/19 2027)  ALPRAZolam (XANAX) tablet 0.25 mg (0.25 mg Oral Given 11/17/19 2022)  gabapentin (NEURONTIN) tablet 600 mg (600 mg Oral Given 11/17/19 2027)  oxyCODONE-acetaminophen (PERCOCET/ROXICET) 5-325 MG per tablet 1 tablet (1 tablet Oral Given 11/17/19 2027)    And  oxyCODONE (Oxy IR/ROXICODONE) immediate release tablet 5 mg (5 mg Oral Given 11/17/19 2027)  iohexol (OMNIPAQUE) 350 MG/ML injection 75 mL (75 mLs Intravenous Contrast Given 11/17/19 2113)    ED Course  I have reviewed the triage vital signs and the nursing notes.  Pertinent labs & imaging results that were available during my care of the patient were reviewed by me and considered in my medical decision making (see chart for details).    MDM Rules/Calculators/A&P                      Patient is nondistressed but deftly has some tachypnea and then when ambulated he dropped into the mid 80s.  More short of breath.  This is probably related to his Covid infection last month.  However with this presentation, is being worked up for further causes of hypoxia such as PE.  No obvious PE seen on CT.  His procalcitonin is negative so bacterial infection is less likely though obviously not impossible.  Given the hypoxia, I think he still needs admission and probably some home oxygen which is not readily available from the ED.   Discussed with Dr. Marlowe Sax, who requests repeat testing for Covid which has been ordered.  It is unclear what is causing the discoloration of his arms. Does not seems ischemic. No pain/swelling. Probably related to covid.  Daniel Durham was evaluated in Emergency Department on 11/17/2019 for the symptoms described in the history of present illness. He was evaluated in the context of the global COVID-19 pandemic, which necessitated consideration that the patient might be at risk for infection with the SARS-CoV-2 virus that causes COVID-19. Institutional protocols and algorithms that pertain to the evaluation of patients at risk for COVID-19 are in a state of rapid change based on information released by regulatory bodies including the CDC and federal and state organizations. These policies and algorithms were followed during the patient's care in the ED.  Final Clinical Impression(s) / ED Diagnoses Final diagnoses:  Hypoxia    Rx / DC Orders ED Discharge Orders    None       Sherwood Gambler, MD 11/17/19 2140    Sherwood Gambler, MD 11/17/19 [phone]

## 2019-11-17 NOTE — Telephone Encounter (Signed)
Spoke with patient, per Cody's request advised pt to seek treatment at [facility] ED or UC. Per patient he will go this afternoon.

## 2019-11-17 NOTE — Telephone Encounter (Signed)
FYI, patient agreeable to go to the ED

## 2019-11-17 NOTE — ED Notes (Signed)
Pt reports pain r/t not taking home meds. Will notify MD

## 2019-11-17 NOTE — ED Notes (Signed)
Pt ambulated around the room several times, O2 sats dropped to 86% on RA, pt assisted back in bed, he was unable to sit back in bed due to SOB and strong dry cough. Pt finally able to sit back in bed for portable CXR, sats 91-93% RA while sitting in bed. Pt endorses burning pain to his chest from coughing

## 2019-11-17 NOTE — H&P (Signed)
History and Physical    Daniel Durham [id number] DOB: [birth year] DOA: 11/17/2019  PCP: Brunetta Jeans, PA-C Patient coming from: Home  Chief Complaint: Shortness of breath  HPI: Daniel Durham is a 54 y.o. male with medical history significant of hypertension, hyperlipidemia, neuromuscular disorder, neuropathy, anxiety, depression, asthma presenting to the ED for evaluation of shortness of breath.  He was recently diagnosed with COVID-19 on 12/15 and admitted to [facility] until 12/26.  Patient states he has not felt better since he left the hospital.  He has continued to feel short of breath even at rest.  He was not sent home on oxygen.  He is coughing so aggressively that it is causing him to have substernal, sharp chest pain.  He also feels dizzy when coughing.  No hemoptysis.  He is using his home nebulizer 4 times a day and albuterol MDI as needed but there has been no improvement in his symptoms.  States while in the hospital he was given a codeine cough syrup which helped with his cough.  His appetite is good but he continues to feel very weak.  No nausea, vomiting, abdominal pain, or diarrhea.  No fevers.  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  He has recently noticed that the skin of both of his arms is starting purple.  In addition, noticed that his right hand has a green discoloration.  He initially thought this was because his hand was not clean so he tried scrubbing it several times but there was no improvement.   ED Course: Afebrile.  Oxygen saturation 91-93% on room air at rest but dropped to 86% with ambulation.  Labs showing no leukocytosis.  High-sensitivity troponin negative x2.  EKG without acute ischemic changes.  Procalcitonin <0.10.  Inflammatory markers mildly elevated: D-dimer 0.69, LDH 221, ferritin 415, fibrinogen 572, and CRP 1.1.  BNP normal.  Transaminases mildly elevated (AST 44, ALT 65).  SARS-CoV-2 rapid  antigen test pending.  Chest x-ray showing persistent areas of mixed interstitial and airspace opacity with a lower lung predominance.  CT angiogram chest ordered to rule out PE pending. Patient received albuterol, Xanax, gabapentin, Percocet, oxycodone, and a 500 cc normal saline bolus.  Review of Systems:  All systems reviewed and apart from history of presenting illness, are negative.  Past Medical History:  Diagnosis Date  . Allergy   . Anxiety   . Asthma    SEASONAL   . Blood transfusion without reported diagnosis   . Chronic back pain    Neurostimulator  . Depression   . Environmental allergies   . Hypertension   . Neuromuscular disorder ([place])   . Neuropathy   . Seasonal allergic conjunctivitis     Past Surgical History:  Procedure Laterality Date  . BACK SURGERY    . CARDIAC CATHETERIZATION     11/05/10 [facility], [place]): [place] of mLAD, no sign disease. EF 45% in RAO, 60% LAO (51% stress, 54% rest by NM stress; 57-59% echo 10/2010)  . CARPAL TUNNEL RELEASE     Bilateral  . CERVICAL DISCECTOMY  12/17/2017   titanium plates 12-17-17  . COLONOSCOPY    . HAND AMPUTATION  2007  . POLYPECTOMY     pt states had colon in [place] and he had polyps- he has no idea what type   . ROTATOR CUFF REPAIR     Left  . SPINAL CORD STIMULATOR IMPLANT    . SPINAL  CORD STIMULATOR REMOVAL N/A 10/26/2015   Procedure: LUMBAR SPINAL CORD STIMULATOR REMOVAL;  Surgeon: Clydell Hakim, MD;  Location: [facility];  Service: Neurosurgery;  Laterality: N/A;     reports that he has never smoked. He has never used smokeless tobacco. He reports current alcohol use. He reports that he does not use drugs.  Allergies  Allergen Reactions  . Aspirin Swelling  . Hydrocodone Itching    Family History  Problem Relation Age of Onset  . Diabetes Mother        Living  . Sleep apnea Mother   . Diabetes Father 35       Deceased  . Hypertension Father   . Jaundice Father   . Hemophilia Father    . Alcoholism Father   . Cancer Maternal Grandfather        Throat  . Esophageal cancer Maternal Grandfather   . Cancer Paternal Uncle        Throat  . Esophageal cancer Paternal Uncle   . Asthma Sister   . Diabetes Sister   . Healthy Son        X1  . Healthy Daughter        X1  . Colon polyps Neg Hx   . Colon cancer Neg Hx   . Rectal cancer Neg Hx   . Stomach cancer Neg Hx     Prior to Admission medications   Medication Sig Start Date End Date Taking? Authorizing Provider  albuterol (PROVENTIL) (2.5 MG/3ML) 0.083% nebulizer solution Take 3 mLs (2.5 mg total) by nebulization every 6 (six) hours as needed for wheezing or shortness of breath. 10/18/19   Brunetta Jeans, PA-C  albuterol (VENTOLIN HFA) 108 (90 Base) MCG/ACT inhaler INHALE 2 PUFFS BY MOUTH EVERY 6 HOURS AS NEEDED FOR WHEEZING 10/18/19   Brunetta Jeans, PA-C  ALPRAZolam Duanne Moron) 0.25 MG tablet Take 1 tablet (0.25 mg total) by mouth 2 (two) times daily as needed for anxiety. 11/17/19   Brunetta Jeans, PA-C  atorvastatin (LIPITOR) 10 MG tablet TAKE 1 TABLET(10 MG) BY MOUTH DAILY 09/23/19   Brunetta Jeans, PA-C  cyclobenzaprine (FLEXERIL) 10 MG tablet Take by mouth.    [provider]  furosemide (LASIX) 20 MG tablet Take by mouth. 10/29/19   [provider]  gabapentin (NEURONTIN) 600 MG tablet Take 1 tablet (600 mg total) by mouth 3 (three) times daily. 06/06/19   Brunetta Jeans, PA-C  guaiFENesin (MUCINEX) 600 MG 12 hr tablet Take by mouth. 10/29/19   [provider]  hydrOXYzine (ATARAX/VISTARIL) 25 MG tablet Take 1 tablet (25 mg total) by mouth 3 (three) times daily as needed. Patient taking differently: Take 25 mg by mouth 3 (three) times daily as needed for anxiety.  09/23/18   Brunetta Jeans, PA-C  Ivermectin (SKLICE) 0.5 % LOTN Apply to dry hair to coat hair and scalp.  Leave on for 10 minutes and then rinse 07/07/19   Midge Minium, MD  lisinopril (ZESTRIL) 20 MG tablet  TAKE 1 TABLET(20 MG) BY MOUTH DAILY 05/27/19   Brunetta Jeans, PA-C  montelukast (SINGULAIR) 10 MG tablet TAKE 1 TABLET(10 MG) BY MOUTH AT BEDTIME 10/07/19   Brunetta Jeans, PA-C  Multiple Vitamins-Minerals (HM MULTIVITAMIN ADULT GUMMY) CHEW Chew 2 tablets by mouth daily.     [provider]  oxyCODONE-acetaminophen (PERCOCET) 10-325 MG tablet TK 1 T PO Q 6 H PRN 11/17/19   Brunetta Jeans, PA-C  sertraline (ZOLOFT) 50  MG tablet Take 1 tablet (50 mg total) by mouth daily. 10/17/19   Brunetta Jeans, PA-C  tiZANidine (ZANAFLEX) 2 MG tablet Take 2 mg by mouth every 8 (eight) hours as needed for muscle spasms.     [provider]    Physical Exam: Vitals:   11/17/19 2330 11/18/19 0000 11/18/19 0100 11/18/19 0130  BP: 121/75 128/84 124/82 121/78  Pulse: 99 (!) 107 94 93  Resp: (!) 32 (!) 34 (!) 29 (!) 27  Temp:      TempSrc:      SpO2: (!) 89% 93% 92% 91%    Physical Exam  Constitutional: He is oriented to person, place, and time. He appears well-developed and well-nourished. No distress.  HENT:  Head: Normocephalic.  Eyes: Right eye exhibits no discharge. Left eye exhibits no discharge.  Cardiovascular: Normal rate, regular rhythm and intact distal pulses.  Pulmonary/Chest: He has no wheezes. He has no rales.  Coughing aggressively Slightly tachypneic  Abdominal: Soft. Bowel sounds are normal. There is no abdominal tenderness. There is no guarding.  Musculoskeletal:        General: No edema.     Cervical back: Neck supple.  Neurological: He is alert and oriented to person, place, and time.  Skin: Skin is warm and dry. He is not diaphoretic.  Bilateral upper extremity with very mild blue/purple discoloration Green discoloration of right hand palm and dorsum     Labs on Admission: I have personally reviewed following labs and imaging studies  CBC: Recent Labs  Lab 11/17/19 1525  WBC 7.9  HGB 15.8  HCT 47.4  MCV 100.9*  PLT [id number]   Basic Metabolic  Panel: Recent Labs  Lab 11/17/19 1525  NA 142  K 4.0  CL 104  CO2 23  GLUCOSE 138*  BUN 8  CREATININE 1.08  CALCIUM 9.4   GFR: CrCl cannot be calculated (Unknown ideal weight.). Liver Function Tests: Recent Labs  Lab 11/17/19 1750  AST 44*  ALT 65*  ALKPHOS 84  BILITOT 0.7  PROT 7.2  ALBUMIN 3.6   No results for input(s): LIPASE, AMYLASE in the last 168 hours. No results for input(s): AMMONIA in the last 168 hours. Coagulation Profile: Recent Labs  Lab 11/17/19 1750  INR 1.0   Cardiac Enzymes: No results for input(s): CKTOTAL, CKMB, CKMBINDEX, TROPONINI in the last 168 hours. BNP (last 3 results) No results for input(s): PROBNP in the last 8760 hours. HbA1C: No results for input(s): HGBA1C in the last 72 hours. CBG: No results for input(s): GLUCAP in the last 168 hours. Lipid Profile: Recent Labs    11/17/19 1750  TRIG 290*   Thyroid Function Tests: No results for input(s): TSH, T4TOTAL, FREET4, T3FREE, THYROIDAB in the last 72 hours. Anemia Panel: Recent Labs    11/17/19 1750  FERRITIN 415*   Urine analysis:    Component Value Date/Time   COLORURINE AMBER (A) 02/12/2019 0820   APPEARANCEUR CLEAR 02/12/2019 0820   LABSPEC >1.030 (H) 02/12/2019 0820   PHURINE 5.5 02/12/2019 0820   GLUCOSEU NEGATIVE 02/12/2019 0820   GLUCOSEU NEGATIVE 01/31/2015 0847   HGBUR TRACE (A) 02/12/2019 0820   BILIRUBINUR MODERATE (A) 02/12/2019 0820   KETONESUR NEGATIVE 02/12/2019 0820   PROTEINUR 100 (A) 02/12/2019 0820   UROBILINOGEN 0.2 01/31/2015 0847   NITRITE NEGATIVE 02/12/2019 0820   LEUKOCYTESUR NEGATIVE 02/12/2019 0820    Radiological Exams on Admission: CT Angio Chest PE W and/or Wo Contrast  Result Date: 11/17/2019 CLINICAL DATA:  COVID-19 diagnosis 10/18/2019, shortness of breath with exertion with central chest pain EXAM: CT ANGIOGRAPHY CHEST WITH CONTRAST TECHNIQUE: Multidetector CT imaging of the chest was performed using the standard protocol during  bolus administration of intravenous contrast. Multiplanar CT image reconstructions and MIPs were obtained to evaluate the vascular anatomy. CONTRAST:  12mL OMNIPAQUE IOHEXOL 350 MG/ML SOLN COMPARISON:  Same-day radiograph, CTA 10/21/2019 FINDINGS: Cardiovascular: Satisfactory opacification of the pulmonary arteries. Distal evaluation of the segmental and subsegmental pulmonary arteries is limited due to respiratory motion artifact. No central or lobar filling defects are identified. Central pulmonary arteries are normal caliber. Normal heart size. No pericardial effusion. The aorta is normal caliber. Normal 3 vessel branching of the aortic arch. Proximal great vessels opacify normally. Mediastinum/Nodes: Scattered low-attenuation subcentimeter mediastinal and hilar lymph nodes are preserved nodal architecture are likely reactive. No pathologically enlarged mediastinal, hilar or axillary adenopathy. Thyroid gland and thoracic inlet are free of acute abnormality. No acute abnormality of the trachea or esophagus either. Lungs/Pleura: Multifocal areas of mixed ground-glass and consolidative opacities present throughout the lungs overall slightly decreased in extent from comparison CTA 10/21/2019. Some superimposed areas of interlobular septal thickening. More bandlike areas of opacity reflecting atelectasis or developing scarring. Airways appear diffusely thickened with some scattered secretions. Dependent atelectasis seen posteriorly. No pneumothorax. No effusion. Upper Abdomen: No acute abnormalities present in the visualized portions of the upper abdomen. Partial fatty replacement of the pancreas is noted. Musculoskeletal: No acute osseous abnormality or suspicious osseous lesion. Minimal degenerative changes are present in the spine and shoulders. Cervical fusion hardware and left shoulder rotator cuff surgical anchors are noted. Review of the MIP images confirms the above findings. IMPRESSION: 1. Distal evaluation  of the segmental and subsegmental pulmonary arteries is limited due to respiratory motion artifact. No central or lobar filling defects are identified. 2. Multifocal areas of mixed ground-glass and consolidative opacities present throughout the lungs overall slightly decreased in extent from comparison CTA 10/21/2019. Diffuse bronchial wall thickening with some scattered secretions. Findings are compatible with a multifocal pneumonia only slightly improved from comparison study. 3. Some superimposed areas interlobular septal thickening may reflect edema. 4. Bandlike areas of opacity could reflect atelectasis or developing scarring. Electronically Signed   By: Lovena Le M.D.   On: 11/17/2019 21:20   DG Chest Port 1 View  Result Date: 11/17/2019 CLINICAL DATA:  COVID-19 diagnosed 1 month prior with hospitalization, continued shortness of breath and dyspnea on exertion EXAM: PORTABLE CHEST 1 VIEW COMPARISON:  Radiograph 11/07/2019 FINDINGS: There are persistent areas of mixed interstitial and airspace opacity with a lower lung predominance which are similar to comparison radiograph 11/07/2019. Findings are worrisome for continued infection or possible superinfection. No visible pneumothorax or effusion. Cardiomediastinal contours are similar to prior though partially obscured by overlying opacity. No acute osseous or soft tissue abnormality. Cervical fusion hardware is noted. Left rotator cuff surgical anchors are noted. IMPRESSION: Persistent areas of mixed interstitial and airspace opacity with a lower lung predominance. Findings are worrisome for continued infection or possible superinfection. Electronically Signed   By: Lovena Le M.D.   On: 11/17/2019 18:35    EKG: Independently reviewed.  Sinus tachycardia, heart rate 119.  LPFB.  No significant change since prior tracing.  Assessment/Plan Principal Problem:   Acute respiratory failure with hypoxia (HCC) Active Problems:   Bluish skin  discoloration   HTN (hypertension)   Acute hypoxic respiratory failure Recently treated for Covid pneumonia at [facility] from 12/15-12/26.  Patient is  tachypneic at rest.  Oxygen saturation 91-93% on room air at rest but dropped to 86% with ambulation. CTA chest negative for PE.  Showing multifocal areas of mixed groundglass and consolidative opacities throughout the lungs, overall slightly decreased in extent from prior CTA done 12/18.  Diffuse bronchial wall thickening with some scattered secretions.  Inflammatory markers mildly elevated: D-dimer 0.69, LDH 221, ferritin 415, fibrinogen 572, and CRP 1.1. Repeat SARS-CoV-2 antigen test negative.  Discussed with Dr. Drucilla Schmidt from [place].  Since patient was diagnosed with Covid >21 days ago, airborne and contact precautions no longer required. Discussed with Dr. Lucile Shutters with PCCM who recommended treating with steroids at this time.  ?Whether underlying asthma is playing a role, although no wheezing/bronchospasm appreciated on exam. -Low suspicion for superimposed bacterial pneumonia given no leukocytosis and procalcitonin <0.10.  Will hold off giving antibiotics. -Solu-Medrol 125 mg once, then 60 mg every 6 hours starting the morning. -DuoNebs every 6 hours -Albuterol nebulizer as needed -Pulmicort twice daily -Antitussives as needed -Pulmonary toilet -Continuous pulse ox, supplemental oxygen as needed to keep oxygen saturation above 92%  Skin discoloration Patient noted to have very mild bluish/purple discoloration of bilateral upper extremities only. ?Possible cyanosis due to hypoxia.  Also has green discoloration of right hand palm and dorsum.  Unclear what is causing this.  No obvious rash.  Hemoglobin and platelet count normal.  No edema or pain in these extremities. -Continue to monitor  Hypertension Currently normotensive. -Hydralazine as needed for SBP >170  Pharmacy med rec pending.  DVT prophylaxis: Lovenox Code Status: Full  code Family Communication: No family at bedside.   Disposition Plan: Anticipate discharge after clinical improvement. Admission status: It is my clinical opinion that referral for OBSERVATION is reasonable and necessary in this patient based on the above information provided. The aforementioned taken together are felt to place the patient at high risk for further clinical deterioration. However it is anticipated that the patient may be medically stable for discharge from the hospital within 24 to 48 hours.  The medical decision making on this patient was of high complexity and the patient is at high risk for clinical deterioration, therefore this is a level 3 visit.  Shela Leff MD [facility] Pager [phone]  If 7PM-7AM, please contact night-coverage [email or web address] Password [email or web address]  11/18/2019, 2:05 AM

## 2019-11-17 NOTE — Telephone Encounter (Signed)
Pt is COVID +, has virtual visit January 4. Using inhaler, muccinex, and nebulizer but still struggling with shortness of breath. Also, requesting his pain and xanax refills.

## 2019-11-17 NOTE — ED Triage Notes (Signed)
Pt diagnosed with COVID and pneumonia on 12/15.  States he was at [facility] until 12/26.  C/o continued SOB with exertion and pain to center of chest.

## 2019-11-18 ENCOUNTER — Encounter (HOSPITAL_COMMUNITY): Payer: Self-pay | Admitting: Internal Medicine

## 2019-11-18 ENCOUNTER — Telehealth: Payer: Self-pay | Admitting: Physician Assistant

## 2019-11-18 DIAGNOSIS — Z6841 Body Mass Index (BMI) 40.0 and over, adult: Secondary | ICD-10-CM | POA: Diagnosis not present

## 2019-11-18 DIAGNOSIS — I10 Essential (primary) hypertension: Secondary | ICD-10-CM | POA: Diagnosis present

## 2019-11-18 DIAGNOSIS — M545 Low back pain: Secondary | ICD-10-CM | POA: Diagnosis present

## 2019-11-18 DIAGNOSIS — Z833 Family history of diabetes mellitus: Secondary | ICD-10-CM | POA: Diagnosis not present

## 2019-11-18 DIAGNOSIS — R230 Cyanosis: Secondary | ICD-10-CM | POA: Diagnosis present

## 2019-11-18 DIAGNOSIS — E785 Hyperlipidemia, unspecified: Secondary | ICD-10-CM | POA: Diagnosis present

## 2019-11-18 DIAGNOSIS — R000 Tachycardia, unspecified: Secondary | ICD-10-CM | POA: Diagnosis present

## 2019-11-18 DIAGNOSIS — B948 Sequelae of other specified infectious and parasitic diseases: Secondary | ICD-10-CM | POA: Diagnosis not present

## 2019-11-18 DIAGNOSIS — J45901 Unspecified asthma with (acute) exacerbation: Secondary | ICD-10-CM | POA: Diagnosis present

## 2019-11-18 DIAGNOSIS — F419 Anxiety disorder, unspecified: Secondary | ICD-10-CM | POA: Diagnosis present

## 2019-11-18 DIAGNOSIS — R7401 Elevation of levels of liver transaminase levels: Secondary | ICD-10-CM | POA: Diagnosis present

## 2019-11-18 DIAGNOSIS — Z825 Family history of asthma and other chronic lower respiratory diseases: Secondary | ICD-10-CM | POA: Diagnosis not present

## 2019-11-18 DIAGNOSIS — F329 Major depressive disorder, single episode, unspecified: Secondary | ICD-10-CM | POA: Diagnosis present

## 2019-11-18 DIAGNOSIS — E669 Obesity, unspecified: Secondary | ICD-10-CM | POA: Diagnosis present

## 2019-11-18 DIAGNOSIS — Z8249 Family history of ischemic heart disease and other diseases of the circulatory system: Secondary | ICD-10-CM | POA: Diagnosis not present

## 2019-11-18 DIAGNOSIS — J9601 Acute respiratory failure with hypoxia: Secondary | ICD-10-CM | POA: Diagnosis present

## 2019-11-18 DIAGNOSIS — G8929 Other chronic pain: Secondary | ICD-10-CM | POA: Diagnosis present

## 2019-11-18 DIAGNOSIS — R0902 Hypoxemia: Secondary | ICD-10-CM | POA: Diagnosis present

## 2019-11-18 LAB — TSH: TSH: 1.12 u[IU]/mL (ref 0.350–4.500)

## 2019-11-18 LAB — SARS CORONAVIRUS 2 (TAT 6-24 HRS): SARS Coronavirus 2: POSITIVE — AB

## 2019-11-18 MED ORDER — HYDROXYZINE HCL 25 MG PO TABS: 25.0000 mg | ORAL_TABLET | Freq: Three times a day (TID) | ORAL | Status: DC | PRN

## 2019-11-18 MED ORDER — GABAPENTIN 300 MG PO CAPS
600.0000 mg | ORAL_CAPSULE | Freq: Once | ORAL | Status: AC
  Administered 2019-11-18: 05:00:00 600 mg via ORAL
  Filled 2019-11-18: qty 2

## 2019-11-18 MED ORDER — RAMELTEON 8 MG PO TABS
8.0000 mg | ORAL_TABLET | Freq: Every evening | ORAL | Status: DC | PRN
  Administered 2019-11-18: 02:00:00 8 mg via ORAL
  Filled 2019-11-18: qty 1

## 2019-11-18 MED ORDER — OXYCODONE-ACETAMINOPHEN 5-325 MG PO TABS
1.0000 | ORAL_TABLET | Freq: Once | ORAL | Status: AC
  Administered 2019-11-18: 05:00:00 2 via ORAL
  Filled 2019-11-18: qty 2

## 2019-11-18 MED ORDER — SERTRALINE HCL 50 MG PO TABS
50.0000 mg | ORAL_TABLET | Freq: Every day | ORAL | Status: DC
  Administered 2019-11-18 – 2019-11-21 (×4): 50 mg via ORAL
  Filled 2019-11-18 (×4): qty 1

## 2019-11-18 MED ORDER — SODIUM CHLORIDE 0.9 % IV BOLUS
500.0000 mL | Freq: Once | INTRAVENOUS | Status: AC
  Administered 2019-11-18: 11:00:00 500 mL via INTRAVENOUS

## 2019-11-18 MED ORDER — GABAPENTIN 300 MG PO CAPS
600.0000 mg | ORAL_CAPSULE | Freq: Three times a day (TID) | ORAL | Status: DC
  Administered 2019-11-18 – 2019-11-21 (×10): 600 mg via ORAL
  Filled 2019-11-18 (×12): qty 2

## 2019-11-18 MED ORDER — BUDESONIDE 180 MCG/ACT IN AEPB
1.0000 | INHALATION_SPRAY | Freq: Two times a day (BID) | RESPIRATORY_TRACT | Status: DC
  Administered 2019-11-19 – 2019-11-21 (×4): 1 via RESPIRATORY_TRACT
  Filled 2019-11-18: qty 1

## 2019-11-18 MED ORDER — CYCLOBENZAPRINE HCL 10 MG PO TABS
5.0000 mg | ORAL_TABLET | Freq: Three times a day (TID) | ORAL | Status: DC | PRN
  Administered 2019-11-20: 06:00:00 5 mg via ORAL
  Filled 2019-11-18: qty 1

## 2019-11-18 MED ORDER — KETOROLAC TROMETHAMINE 15 MG/ML IJ SOLN
15.0000 mg | Freq: Four times a day (QID) | INTRAMUSCULAR | Status: DC | PRN
  Administered 2019-11-18 – 2019-11-20 (×5): 15 mg via INTRAVENOUS
  Filled 2019-11-18 (×5): qty 1

## 2019-11-18 MED ORDER — ALBUTEROL SULFATE (2.5 MG/3ML) 0.083% IN NEBU: 2.5000 mg | INHALATION_SOLUTION | RESPIRATORY_TRACT | Status: DC | PRN

## 2019-11-18 MED ORDER — ALPRAZOLAM 0.25 MG PO TABS
0.2500 mg | ORAL_TABLET | Freq: Two times a day (BID) | ORAL | Status: DC | PRN
  Administered 2019-11-19 – 2019-11-21 (×4): 0.25 mg via ORAL
  Filled 2019-11-18 (×4): qty 1

## 2019-11-18 MED ORDER — IPRATROPIUM-ALBUTEROL 20-100 MCG/ACT IN AERS
1.0000 | INHALATION_SPRAY | Freq: Four times a day (QID) | RESPIRATORY_TRACT | Status: DC
  Administered 2019-11-18 – 2019-11-19 (×5): 1 via RESPIRATORY_TRACT
  Filled 2019-11-18: qty 4

## 2019-11-18 MED ORDER — ALPRAZOLAM 0.25 MG PO TABS
0.2500 mg | ORAL_TABLET | Freq: Once | ORAL | Status: AC
  Administered 2019-11-18: 08:00:00 0.25 mg via ORAL
  Filled 2019-11-18 (×2): qty 1

## 2019-11-18 MED ORDER — OXYCODONE-ACETAMINOPHEN 5-325 MG PO TABS
2.0000 | ORAL_TABLET | ORAL | Status: DC | PRN
  Administered 2019-11-18 – 2019-11-21 (×12): 2 via ORAL
  Filled 2019-11-18 (×12): qty 2

## 2019-11-18 MED ORDER — HYDRALAZINE HCL 20 MG/ML IJ SOLN: 10.0000 mg | INTRAMUSCULAR | Status: DC | PRN

## 2019-11-18 MED ORDER — ALBUTEROL SULFATE HFA 108 (90 BASE) MCG/ACT IN AERS
1.0000 | INHALATION_SPRAY | RESPIRATORY_TRACT | Status: DC | PRN
  Filled 2019-11-18: qty 6.7

## 2019-11-18 NOTE — ED Notes (Signed)
Message sent to pharmacy for gabapentin

## 2019-11-18 NOTE — ED Notes (Signed)
Available meds given per [facility] Pt transported to [place] on continuous monitors with this RN in NAD with all belongings. Pt alert, speaking in full sentences. VSS. PIV saline locked

## 2019-11-18 NOTE — Telephone Encounter (Signed)
Pt called in stating that he is in the hospital again. He states that he is in a lot of pain and hasn't slept in about 2 days due to the pain. He states that they won't give him anything stronger because he has a contract with [place] about pain medications. He wanted to know if [name] could ok something stronger for him. Pt is in [place]. Pt can be reached at the cell #

## 2019-11-18 NOTE — ED Notes (Signed)
Pharmacy informed that gabapentin still not received at tube station

## 2019-11-18 NOTE — ED Notes (Signed)
x1 unsuccessful attempt to call report. RN unavailable at this time

## 2019-11-18 NOTE — ED Notes (Signed)
ED TO INPATIENT HANDOFF REPORT  ED Nurse Name and Phone #: [phone]  S Name/Age/Gender Daniel Durham 54 y.o. male Room/Bed: 011C/011C  Code Status   Code Status: Full Code  Home/SNF/Other Home Patient oriented to: self, place, time and situation Is this baseline? Yes   Triage Complete: Triage complete  Chief Complaint Acute respiratory failure with hypoxia ([place]) [J96.01]  Triage Note Pt diagnosed with COVID and pneumonia on 12/15.  States he was at [facility] until 12/26.  C/o continued SOB with exertion and pain to center of chest.    Allergies Allergies  Allergen Reactions  . Aspirin Swelling  . Hydrocodone Itching    Level of Care/Admitting Diagnosis ED Disposition    ED Disposition Condition Comment   Admit  Hospital Area: [facility] [[id number]]  Level of Care: Telemetry Medical [[id number]]  Covid Evaluation: Confirmed COVID Positive  Diagnosis: Acute respiratory failure with hypoxia [facility]) [[id number]]  Admitting Physician: Maida Sale  Attending Physician: Geradine Girt [[id number]]  Estimated length of stay: past midnight tomorrow  Certification:: I certify this patient will need inpatient services for at least 2 midnights       B Medical/Surgery History Past Medical History:  Diagnosis Date  . Allergy   . Anxiety   . Asthma    SEASONAL   . Blood transfusion without reported diagnosis   . Chronic back pain    Neurostimulator  . Depression   . Environmental allergies   . Hypertension   . Neuromuscular disorder ([place])   . Neuropathy   . Seasonal allergic conjunctivitis    Past Surgical History:  Procedure Laterality Date  . BACK SURGERY    . CARDIAC CATHETERIZATION     11/05/10 [facility], [place]): [place] of mLAD, no sign disease. EF 45% in RAO, 60% LAO (51% stress, 54% rest by NM stress; 57-59% echo 10/2010)  . CARPAL TUNNEL RELEASE     Bilateral  . CERVICAL DISCECTOMY  12/17/2017   titanium plates 12-17-17   . COLONOSCOPY    . HAND AMPUTATION  2007  . POLYPECTOMY     pt states had colon in [place] and he had polyps- he has no idea what type   . ROTATOR CUFF REPAIR     Left  . SPINAL CORD STIMULATOR IMPLANT    . SPINAL CORD STIMULATOR REMOVAL N/A 10/26/2015   Procedure: LUMBAR SPINAL CORD STIMULATOR REMOVAL;  Surgeon: Clydell Hakim, MD;  Location: [place] [facility];  Service: Neurosurgery;  Laterality: N/A;     A IV Location/Drains/Wounds Patient Lines/Drains/Airways Status   Active Line/Drains/Airways    Name:   Placement date:   Placement time:   Site:   Days:   Peripheral IV 11/17/19 Right Antecubital   11/17/19    1746    Antecubital   1          Intake/Output Last 24 hours No intake or output data in the 24 hours ending 11/18/19 2026  Labs/Imaging Results for orders placed or performed during the hospital encounter of 11/17/19 (from the past 48 hour(s))  Basic metabolic panel     Status: Abnormal   Collection Time: 11/17/19  3:25 PM  Result Value Ref Range   Sodium 142 135 - 145 mmol/L   Potassium 4.0 3.5 - 5.1 mmol/L   Chloride 104 98 - 111 mmol/L   CO2 23 22 - 32 mmol/L   Glucose, Bld 138 (H) 70 - 99 mg/dL   BUN 8 6 -  20 mg/dL   Creatinine, Ser 1.08 0.61 - 1.24 mg/dL   Calcium 9.4 8.9 - 10.3 mg/dL   GFR calc non Af Amer >60 >60 mL/min   GFR calc Af Amer >60 >60 mL/min   Anion gap 15 5 - 15    Comment: Performed at [facility] [address], [place], [place] [address]  CBC     Status: Abnormal   Collection Time: 11/17/19  3:25 PM  Result Value Ref Range   WBC 7.9 4.0 - 10.5 K/uL   RBC 4.70 4.22 - 5.81 MIL/uL   Hemoglobin 15.8 13.0 - 17.0 g/dL   HCT 47.4 39.0 - 52.0 %   MCV 100.9 (H) 80.0 - 100.0 fL   MCH 33.6 26.0 - 34.0 pg   MCHC 33.3 30.0 - 36.0 g/dL   RDW 12.7 11.5 - 15.5 %   Platelets 235 150 - 400 K/uL   nRBC 0.0 0.0 - 0.2 %    Comment: Performed at [facility], [place] [address] [place], [place] [address]  Troponin I (High Sensitivity)      Status: None   Collection Time: 11/17/19  3:25 PM  Result Value Ref Range   Troponin I (High Sensitivity) 4 <18 ng/L    Comment: (NOTE) Elevated high sensitivity troponin I (hsTnI) values and significant  changes across serial measurements may suggest ACS but many other  chronic and acute conditions are known to elevate hsTnI results.  Refer to the "Links" section for chest pain algorithms and additional  guidance. Performed at [facility], [place] [address] [place], [place] [address]   Troponin I (High Sensitivity)     Status: None   Collection Time: 11/17/19  5:06 PM  Result Value Ref Range   Troponin I (High Sensitivity) 3 <18 ng/L    Comment: (NOTE) Elevated high sensitivity troponin I (hsTnI) values and significant  changes across serial measurements may suggest ACS but many other  chronic and acute conditions are known to elevate hsTnI results.  Refer to the "Links" section for chest pain algorithms and additional  guidance. Performed at [facility], [place] [address] [place], [place] [address]   D-dimer, quantitative     Status: Abnormal   Collection Time: 11/17/19  5:50 PM  Result Value Ref Range   D-Dimer, Quant 0.69 (H) 0.00 - 0.50 ug/mL-FEU    Comment: (NOTE) At the manufacturer cut-off of 0.50 ug/mL FEU, this assay has been documented to exclude PE with a sensitivity and negative predictive value of 97 to 99%.  At this time, this assay has not been approved by the FDA to exclude DVT/VTE. Results should be correlated with clinical presentation. Performed at [facility], [place] [address], [place],  [address]   Procalcitonin     Status: None   Collection Time: 11/17/19  5:50 PM  Result Value Ref Range   Procalcitonin <0.10 ng/mL    Comment:        Interpretation: PCT (Procalcitonin) <= 0.5 ng/mL: Systemic infection (sepsis) is not likely. Local bacterial infection is possible. (NOTE)       Sepsis PCT Algorithm           Lower Respiratory  Tract                                      Infection PCT Algorithm    ----------------------------     ----------------------------  PCT < 0.25 ng/mL                PCT < 0.10 ng/mL         Strongly encourage             Strongly discourage   discontinuation of antibiotics    initiation of antibiotics    ----------------------------     -----------------------------       PCT 0.25 - 0.50 ng/mL            PCT 0.10 - 0.25 ng/mL               OR       >80% decrease in PCT            Discourage initiation of                                            antibiotics      Encourage discontinuation           of antibiotics    ----------------------------     -----------------------------         PCT >= 0.50 ng/mL              PCT 0.26 - 0.50 ng/mL               AND        <80% decrease in PCT             Encourage initiation of                                             antibiotics       Encourage continuation           of antibiotics    ----------------------------     -----------------------------        PCT >= 0.50 ng/mL                  PCT > 0.50 ng/mL               AND         increase in PCT                  Strongly encourage                                      initiation of antibiotics    Strongly encourage escalation           of antibiotics                                     -----------------------------                                           PCT <= 0.25 ng/mL  OR                                        > 80% decrease in PCT                                     Discontinue / Do not initiate                                             antibiotics Performed at [facility], [place] [address] [place], [place] [address]   Lactate dehydrogenase     Status: Abnormal   Collection Time: 11/17/19  5:50 PM  Result Value Ref Range   LDH 221 (H) 98 - 192 U/L    Comment: Performed at [facility] [address]  [place], [place] [address]  Ferritin     Status: Abnormal   Collection Time: 11/17/19  5:50 PM  Result Value Ref Range   Ferritin 415 (H) 24 - 336 ng/mL    Comment: Performed at [facility] [address] [place], [place] [address]  Triglycerides     Status: Abnormal   Collection Time: 11/17/19  5:50 PM  Result Value Ref Range   Triglycerides 290 (H) <150 mg/dL    Comment: Performed at [facility] [address], [place], [place] [address]  Fibrinogen     Status: Abnormal   Collection Time: 11/17/19  5:50 PM  Result Value Ref Range   Fibrinogen 572 (H) 210 - 475 mg/dL    Comment: Performed at [facility] [address], [place], [place] [address]  C-reactive protein     Status: Abnormal   Collection Time: 11/17/19  5:50 PM  Result Value Ref Range   CRP 1.1 (H) <1.0 mg/dL    Comment: Performed at [facility] [address], [place], [place] [address]  Brain natriuretic peptide     Status: None   Collection Time: 11/17/19  5:50 PM  Result Value Ref Range   B Natriuretic Peptide 16.2 0.0 - 100.0 pg/mL    Comment: Performed at [facility] [address], [place],  [address]  Hepatic function panel     Status: Abnormal   Collection Time: 11/17/19  5:50 PM  Result Value Ref Range   Total Protein 7.2 6.5 - 8.1 g/dL   Albumin 3.6 3.5 - 5.0 g/dL   AST 44 (H) 15 - 41 U/L   ALT 65 (H) 0 - 44 U/L   Alkaline Phosphatase 84 38 - 126 U/L   Total Bilirubin 0.7 0.3 - 1.2 mg/dL   Bilirubin, Direct 0.1 0.0 - 0.2 mg/dL   Indirect Bilirubin 0.6 0.3 - 0.9 mg/dL    Comment: Performed at [facility] [address], [place],  [address]  PT/INR     Status: None   Collection Time: 11/17/19  5:50 PM  Result Value Ref Range   Prothrombin Time 12.9 11.4 - 15.2 seconds   INR 1.0 0.8 - 1.2    Comment: (NOTE) INR goal varies based on device and disease states. Performed at [facility], [place]  [address], [place], [place] [address]   APTT      Status: None   Collection Time: 11/17/19  5:50 PM  Result Value Ref Range   aPTT 30 24 - 36 seconds    Comment: Performed at [facility] [address] [place], [place] [address]  POC SARS Coronavirus 2 Ag-ED - Nasal Swab (BD Veritor Kit)     Status: None   Collection Time: 11/17/19  8:55 PM  Result Value Ref Range   SARS Coronavirus 2 Ag NEGATIVE NEGATIVE    Comment: (NOTE) SARS-CoV-2 antigen NOT DETECTED.  Negative results are presumptive.  Negative results do not preclude SARS-CoV-2 infection and should not be used as the sole basis for treatment or other patient management decisions, including infection  control decisions, particularly in the presence of clinical signs and  symptoms consistent with COVID-19, or in those who have been in contact with the virus.  Negative results must be combined with clinical observations, patient history, and epidemiological information. The expected result is Negative. Fact Sheet for Patients: PodPark.tn Fact Sheet for Healthcare Providers: GiftContent.is This test is not yet approved or cleared by the [place] FDA and  has been authorized for detection and/or diagnosis of SARS-CoV-2 by FDA under an Emergency Use Authorization (EUA).  This EUA will remain in effect (meaning this test can be used) for the duration of  the COVID-19 de claration under Section 564(b)(1) of the Act, 21 U.S.C. section 360bbb-3(b)(1), unless the authorization is terminated or revoked sooner.   ABO/Rh     Status: None   Collection Time: 11/17/19  9:56 PM  Result Value Ref Range   ABO/RH(D)      O NEG Performed at [facility] [address] [place], [place] [address]   SARS CORONAVIRUS 2 (TAT 6-24 HRS) Nasopharyngeal Nasopharyngeal Swab     Status: Abnormal   Collection Time: 11/17/19 10:34 PM   Specimen: Nasopharyngeal Swab  Result Value Ref Range   SARS Coronavirus 2 POSITIVE (A)  NEGATIVE    Comment: RESULT CALLED TO, READ BACK BY AND VERIFIED WITH: B. MONTEE,RN [id number] 11/18/2019 T. TYSOR (NOTE) SARS-CoV-2 target nucleic acids are DETECTED. The SARS-CoV-2 RNA is generally detectable in upper and lower respiratory specimens during the acute phase of infection. Positive results are indicative of the presence of SARS-CoV-2 RNA. Clinical correlation with patient history and other diagnostic information is  necessary to determine patient infection status. Positive results do not rule out bacterial infection or co-infection with other viruses.  The expected result is Negative. Fact Sheet for Patients: SugarRoll.be Fact Sheet for Healthcare Providers: https://www.woods-mathews.com/ This test is not yet approved or cleared by the [place] FDA and  has been authorized for detection and/or diagnosis of SARS-CoV-2 by FDA under an Emergency Use Authorization (EUA). This EUA will remain  in effect (meaning this test can be used) for t he duration of the COVID-19 declaration under Section 564(b)(1) of the Act, 21 U.S.C. section 360bbb-3(b)(1), unless the authorization is terminated or revoked sooner. Performed at [facility], [place] [address], [place], [place] [address]    CT Angio Chest PE W and/or Wo Contrast  Result Date: 11/17/2019 CLINICAL DATA:  COVID-19 diagnosis 10/18/2019, shortness of breath with exertion with central chest pain EXAM: CT ANGIOGRAPHY CHEST WITH CONTRAST TECHNIQUE: Multidetector CT imaging of the chest was performed using the standard protocol during bolus administration of intravenous contrast. Multiplanar CT image reconstructions and MIPs were obtained to evaluate the vascular  anatomy. CONTRAST:  27m OMNIPAQUE IOHEXOL 350 MG/ML SOLN COMPARISON:  Same-day radiograph, CTA 10/21/2019 FINDINGS: Cardiovascular: Satisfactory opacification of the pulmonary arteries. Distal evaluation of the segmental and  subsegmental pulmonary arteries is limited due to respiratory motion artifact. No central or lobar filling defects are identified. Central pulmonary arteries are normal caliber. Normal heart size. No pericardial effusion. The aorta is normal caliber. Normal 3 vessel branching of the aortic arch. Proximal great vessels opacify normally. Mediastinum/Nodes: Scattered low-attenuation subcentimeter mediastinal and hilar lymph nodes are preserved nodal architecture are likely reactive. No pathologically enlarged mediastinal, hilar or axillary adenopathy. Thyroid gland and thoracic inlet are free of acute abnormality. No acute abnormality of the trachea or esophagus either. Lungs/Pleura: Multifocal areas of mixed ground-glass and consolidative opacities present throughout the lungs overall slightly decreased in extent from comparison CTA 10/21/2019. Some superimposed areas of interlobular septal thickening. More bandlike areas of opacity reflecting atelectasis or developing scarring. Airways appear diffusely thickened with some scattered secretions. Dependent atelectasis seen posteriorly. No pneumothorax. No effusion. Upper Abdomen: No acute abnormalities present in the visualized portions of the upper abdomen. Partial fatty replacement of the pancreas is noted. Musculoskeletal: No acute osseous abnormality or suspicious osseous lesion. Minimal degenerative changes are present in the spine and shoulders. Cervical fusion hardware and left shoulder rotator cuff surgical anchors are noted. Review of the MIP images confirms the above findings. IMPRESSION: 1. Distal evaluation of the segmental and subsegmental pulmonary arteries is limited due to respiratory motion artifact. No central or lobar filling defects are identified. 2. Multifocal areas of mixed ground-glass and consolidative opacities present throughout the lungs overall slightly decreased in extent from comparison CTA 10/21/2019. Diffuse bronchial wall thickening  with some scattered secretions. Findings are compatible with a multifocal pneumonia only slightly improved from comparison study. 3. Some superimposed areas interlobular septal thickening may reflect edema. 4. Bandlike areas of opacity could reflect atelectasis or developing scarring. Electronically Signed   By: PLovena LeM.D.   On: 11/17/2019 21:20   DG Chest Port 1 View  Result Date: 11/17/2019 CLINICAL DATA:  COVID-19 diagnosed 1 month prior with hospitalization, continued shortness of breath and dyspnea on exertion EXAM: PORTABLE CHEST 1 VIEW COMPARISON:  Radiograph 11/07/2019 FINDINGS: There are persistent areas of mixed interstitial and airspace opacity with a lower lung predominance which are similar to comparison radiograph 11/07/2019. Findings are worrisome for continued infection or possible superinfection. No visible pneumothorax or effusion. Cardiomediastinal contours are similar to prior though partially obscured by overlying opacity. No acute osseous or soft tissue abnormality. Cervical fusion hardware is noted. Left rotator cuff surgical anchors are noted. IMPRESSION: Persistent areas of mixed interstitial and airspace opacity with a lower lung predominance. Findings are worrisome for continued infection or possible superinfection. Electronically Signed   By: PLovena LeM.D.   On: 11/17/2019 18:35    Pending Labs Unresulted Labs (From admission, onward)    Start     Ordered   11/18/19 1103  TSH  Add-on,   AD     11/18/19 1102          Vitals/Pain Today's Vitals   11/18/19 1900 11/18/19 1915 11/18/19 1925 11/18/19 1945  BP: 130/83 135/84  131/77  Pulse: 100 100  95  Resp: (!) 22 19  18   Temp:      TempSrc:      SpO2: (!) 88% 90%  92%  PainSc:   5      Isolation Precautions No active isolations  Medications Medications  enoxaparin (LOVENOX) injection 55 mg (55 mg Subcutaneous Given 11/17/19 2154)  ascorbic acid (VITAMIN C) tablet 500 mg (500 mg Oral Given 11/18/19  0914)  zinc sulfate capsule 220 mg (220 mg Oral Given 11/18/19 0914)  acetaminophen (TYLENOL) tablet 650 mg (has no administration in time range)  guaiFENesin-codeine 100-10 MG/5ML solution 5 mL (5 mLs Oral Given 11/18/19 1648)  benzonatate (TESSALON) capsule 100 mg (100 mg Oral Given 11/18/19 0524)  methylPREDNISolone sodium succinate (SOLU-MEDROL) 125 mg/2 mL injection 60 mg (60 mg Intravenous Given 11/18/19 1435)  ramelteon (ROZEREM) tablet 8 mg (8 mg Oral Given 11/18/19 0212)  hydrALAZINE (APRESOLINE) injection 10 mg (has no administration in time range)  cyclobenzaprine (FLEXERIL) tablet 5 mg (has no administration in time range)  oxyCODONE-acetaminophen (PERCOCET/ROXICET) 5-325 MG per tablet 2 tablet (2 tablets Oral Given 11/18/19 1648)  ALPRAZolam (XANAX) tablet 0.25 mg (has no administration in time range)  hydrOXYzine (ATARAX/VISTARIL) tablet 25 mg (has no administration in time range)  sertraline (ZOLOFT) tablet 50 mg (50 mg Oral Given 11/18/19 1134)  gabapentin (NEURONTIN) capsule 600 mg (600 mg Oral Given 11/18/19 1816)  ketorolac (TORADOL) 15 MG/ML injection 15 mg (15 mg Intravenous Given 11/18/19 1653)  albuterol (VENTOLIN HFA) 108 (90 Base) MCG/ACT inhaler 1-2 puff (has no administration in time range)  Ipratropium-Albuterol (COMBIVENT) respimat 1 puff (has no administration in time range)  budesonide (PULMICORT) 180 MCG/ACT inhaler 1 puff (has no administration in time range)  sodium chloride flush (NS) 0.9 % injection 3 mL (3 mLs Intravenous Given 11/17/19 1809)  albuterol (VENTOLIN HFA) 108 (90 Base) MCG/ACT inhaler 4 puff (4 puffs Inhalation Given 11/17/19 1749)  sodium chloride 0.9 % bolus 500 mL (0 mLs Intravenous Stopped 11/17/19 2027)  ALPRAZolam (XANAX) tablet 0.25 mg (0.25 mg Oral Given 11/17/19 2022)  gabapentin (NEURONTIN) tablet 600 mg (600 mg Oral Given 11/17/19 2027)  oxyCODONE-acetaminophen (PERCOCET/ROXICET) 5-325 MG per tablet 1 tablet (1 tablet Oral Given 11/17/19 2027)     And  oxyCODONE (Oxy IR/ROXICODONE) immediate release tablet 5 mg (5 mg Oral Given 11/17/19 2027)  iohexol (OMNIPAQUE) 350 MG/ML injection 75 mL (75 mLs Intravenous Contrast Given 11/17/19 2113)  methylPREDNISolone sodium succinate (SOLU-MEDROL) 125 mg/2 mL injection 125 mg (125 mg Intravenous Given 11/17/19 2235)  gabapentin (NEURONTIN) capsule 600 mg (600 mg Oral Given 11/18/19 0433)  ALPRAZolam (XANAX) tablet 0.25 mg (0.25 mg Oral Given 11/18/19 0743)  oxyCODONE-acetaminophen (PERCOCET/ROXICET) 5-325 MG per tablet 1-2 tablet (2 tablets Oral Given 11/18/19 0433)  sodium chloride 0.9 % bolus 500 mL (0 mLs Intravenous Stopped 11/18/19 1811)    Mobility walks with person assist Low fall risk   Focused Assessments Cardiac Assessment Handoff:  Cardiac Rhythm: Sinus tachycardia Lab Results  Component Value Date   CKTOTAL 173 05/12/2017   TROPONINI <0.03 02/10/2019   Lab Results  Component Value Date   DDIMER 0.69 (H) 11/17/2019   Does the Patient currently have chest pain? No     R Recommendations: See Admitting Provider Note  Report given to:   Additional Notes:

## 2019-11-18 NOTE — ED Notes (Signed)
Tele   Breakfast ordered  

## 2019-11-18 NOTE — ED Notes (Signed)
Pt experiencing anxiety over condition. Advised patient on breathing techniques and offered incentive spirometer.

## 2019-11-18 NOTE — Progress Notes (Addendum)
[facility] PROGRESS NOTE  Skylur Bedner [id number] DOB: [birth year] DOA: 11/17/2019 PCP: Brunetta Jeans, PA-C  Assessment/Plan:  #1.  Acute respiratory failure with hypoxia likely related to recent covid.  Oxygen saturation level greater than 90% on room air at rest dropped to 86% with ambulation.  CTA negative for PE.  CT did reveal multifocal areas of mixed groundglass and consolidative opacities throughout the lungs overall slightly decreased from the prior CT done 4 weeks ago.  Inflammatory markers remain elevated and SARS Coronavirus 2 + and POC SARS negative. He is afebrile and non toxic appearing.  Continues with tachypnea frequent nonproductive cough.  Moves fair air no wheeze no crackles.  Frequent tight sounding cough.  Provided with 125 mg of Solu-Medrol in the emergency department.  Scheduled Solu-Medrol initiated as well.  -Continue Solu-Medrol -Continue scheduled nebs -Add flutter valve -Antitussive as needed -Oxygen supplementation as indicated -Monitor closely  #2.  Tachycardia.  Likely related to above in setting of nebulizers.  CTA negative for PE.  Appears slightly dry.  Heart rate 109.  EKG with sinus tach. -We will provide small fluid bolus. -Obtain TSH for completeness -Antianxiety agents. -Monitor  #3.  Hypertension.  Blood pressure elevated at presentation.  Then went to the low end of normal.  Then normotensive.  This morning blood pressure is on the low end of normal.  Home medications include lisinopril -Holding lisinopril for now -We will resume as indicated -As needed hydralazine  #4.  Anxiety/depression.  Patient is quite anxious.  Home medications include Xanax, Atarax, Zoloft. -Continue home medicines -Monitor  #5.  Chronic pain.  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  History of spinal cord stimulator.  Home medications include Flexeril, Neurontin, Percocet. Reports his chronic pain is worse with all the coughing. -Resume Flexeril and  Neurontin -Provide Percocet -Monitor closely  #6.  Skin discoloration.  Patient very anxious about newish green discoloration bilateral upper extremities.  His upper extremities are warm to the touch.  I think it is related to either tattoos bleeding while he is sweating and or some fabric rubbing off on him.  He is in the bed with no shirt on.  I took an alcohol swab and was able to clean this discoloration off.   Code Status: full Family Communication: patient at bedside Disposition Plan: home hopefully 24-48 hours   Consultants:    Procedures:    Antibiotics:    HPI/Subjective: Sitting on side of bed coughing.  Verbalizes distress about not feeling any better and inability to get any rest due to the frequent coughing.  Also reports increased pain from his chronic pain level.  Objective: Vitals:   11/18/19 0900 11/18/19 1000  BP: 120/88 106/64  Pulse: (!) 115 (!) 109  Resp:  (!) 33  Temp:    SpO2: 93% 92%   No intake or output data in the 24 hours ending 11/18/19 1103 There were no vitals filed for this visit.  Exam:   General: Awake alert obese mild to moderate distress related to shortness of breath and anxiety  Cardiovascular: Tachycardic but regular no murmur gallop or rub no lower extremity edema pedal pulses present palpable  Respiratory: Tachypnea moderate increased work of breathing mostly due to frequent coughing.  Breath sounds slightly distant but good airflow particularly on the left somewhat diminished on the right base I hear no wheeze no crackles  Abdomen: Obese soft positive bowel sounds throughout no guarding or rebounding  Musculoskeletal: Joints without swelling/erythema full  range of motion left below the elbow amputation  Data Reviewed: Basic Metabolic Panel: Recent Labs  Lab 11/17/19 1525  NA 142  K 4.0  CL 104  CO2 23  GLUCOSE 138*  BUN 8  CREATININE 1.08  CALCIUM 9.4   Liver Function Tests: Recent Labs  Lab 11/17/19 1750   AST 44*  ALT 65*  ALKPHOS 84  BILITOT 0.7  PROT 7.2  ALBUMIN 3.6   No results for input(s): LIPASE, AMYLASE in the last 168 hours. No results for input(s): AMMONIA in the last 168 hours. CBC: Recent Labs  Lab 11/17/19 1525  WBC 7.9  HGB 15.8  HCT 47.4  MCV 100.9*  PLT 235   Cardiac Enzymes: No results for input(s): CKTOTAL, CKMB, CKMBINDEX, TROPONINI in the last 168 hours. BNP (last 3 results) Recent Labs    02/10/19 1630 11/17/19 1750  BNP 15.9 16.2    ProBNP (last 3 results) No results for input(s): PROBNP in the last 8760 hours.  CBG: No results for input(s): GLUCAP in the last 168 hours.  Recent Results (from the past 240 hour(s))  SARS CORONAVIRUS 2 (TAT 6-24 HRS) Nasopharyngeal Nasopharyngeal Swab     Status: Abnormal   Collection Time: 11/17/19 10:34 PM   Specimen: Nasopharyngeal Swab  Result Value Ref Range Status   SARS Coronavirus 2 POSITIVE (A) NEGATIVE Final    Comment: RESULT CALLED TO, READ BACK BY AND VERIFIED WITH: B. MONTEE,RN [id number] 11/18/2019 T. TYSOR (NOTE) SARS-CoV-2 target nucleic acids are DETECTED. The SARS-CoV-2 RNA is generally detectable in upper and lower respiratory specimens during the acute phase of infection. Positive results are indicative of the presence of SARS-CoV-2 RNA. Clinical correlation with patient history and other diagnostic information is  necessary to determine patient infection status. Positive results do not rule out bacterial infection or co-infection with other viruses.  The expected result is Negative. Fact Sheet for Patients: SugarRoll.be Fact Sheet for Healthcare Providers: https://www.woods-mathews.com/ This test is not yet approved or cleared by the [place] FDA and  has been authorized for detection and/or diagnosis of SARS-CoV-2 by FDA under an Emergency Use Authorization (EUA). This EUA will remain  in effect (meaning this test can be used) for t he  duration of the COVID-19 declaration under Section 564(b)(1) of the Act, 21 U.S.C. section 360bbb-3(b)(1), unless the authorization is terminated or revoked sooner. Performed at [facility], [place] [address], [place], [place] [address]      Studies: CT Angio Chest PE W and/or Wo Contrast  Result Date: 11/17/2019 CLINICAL DATA:  COVID-19 diagnosis 10/18/2019, shortness of breath with exertion with central chest pain EXAM: CT ANGIOGRAPHY CHEST WITH CONTRAST TECHNIQUE: Multidetector CT imaging of the chest was performed using the standard protocol during bolus administration of intravenous contrast. Multiplanar CT image reconstructions and MIPs were obtained to evaluate the vascular anatomy. CONTRAST:  41mL OMNIPAQUE IOHEXOL 350 MG/ML SOLN COMPARISON:  Same-day radiograph, CTA 10/21/2019 FINDINGS: Cardiovascular: Satisfactory opacification of the pulmonary arteries. Distal evaluation of the segmental and subsegmental pulmonary arteries is limited due to respiratory motion artifact. No central or lobar filling defects are identified. Central pulmonary arteries are normal caliber. Normal heart size. No pericardial effusion. The aorta is normal caliber. Normal 3 vessel branching of the aortic arch. Proximal great vessels opacify normally. Mediastinum/Nodes: Scattered low-attenuation subcentimeter mediastinal and hilar lymph nodes are preserved nodal architecture are likely reactive. No pathologically enlarged mediastinal, hilar or axillary adenopathy. Thyroid gland and thoracic inlet are free of acute abnormality.  No acute abnormality of the trachea or esophagus either. Lungs/Pleura: Multifocal areas of mixed ground-glass and consolidative opacities present throughout the lungs overall slightly decreased in extent from comparison CTA 10/21/2019. Some superimposed areas of interlobular septal thickening. More bandlike areas of opacity reflecting atelectasis or developing scarring. Airways appear diffusely  thickened with some scattered secretions. Dependent atelectasis seen posteriorly. No pneumothorax. No effusion. Upper Abdomen: No acute abnormalities present in the visualized portions of the upper abdomen. Partial fatty replacement of the pancreas is noted. Musculoskeletal: No acute osseous abnormality or suspicious osseous lesion. Minimal degenerative changes are present in the spine and shoulders. Cervical fusion hardware and left shoulder rotator cuff surgical anchors are noted. Review of the MIP images confirms the above findings. IMPRESSION: 1. Distal evaluation of the segmental and subsegmental pulmonary arteries is limited due to respiratory motion artifact. No central or lobar filling defects are identified. 2. Multifocal areas of mixed ground-glass and consolidative opacities present throughout the lungs overall slightly decreased in extent from comparison CTA 10/21/2019. Diffuse bronchial wall thickening with some scattered secretions. Findings are compatible with a multifocal pneumonia only slightly improved from comparison study. 3. Some superimposed areas interlobular septal thickening may reflect edema. 4. Bandlike areas of opacity could reflect atelectasis or developing scarring. Electronically Signed   By: Lovena Le M.D.   On: 11/17/2019 21:20   DG Chest Port 1 View  Result Date: 11/17/2019 CLINICAL DATA:  COVID-19 diagnosed 1 month prior with hospitalization, continued shortness of breath and dyspnea on exertion EXAM: PORTABLE CHEST 1 VIEW COMPARISON:  Radiograph 11/07/2019 FINDINGS: There are persistent areas of mixed interstitial and airspace opacity with a lower lung predominance which are similar to comparison radiograph 11/07/2019. Findings are worrisome for continued infection or possible superinfection. No visible pneumothorax or effusion. Cardiomediastinal contours are similar to prior though partially obscured by overlying opacity. No acute osseous or soft tissue abnormality.  Cervical fusion hardware is noted. Left rotator cuff surgical anchors are noted. IMPRESSION: Persistent areas of mixed interstitial and airspace opacity with a lower lung predominance. Findings are worrisome for continued infection or possible superinfection. Electronically Signed   By: Lovena Le M.D.   On: 11/17/2019 18:35    Scheduled Meds: . vitamin C  500 mg Oral Daily  . budesonide (PULMICORT) nebulizer solution  0.25 mg Nebulization BID  . enoxaparin (LOVENOX) injection  55 mg Subcutaneous Q24H  . ipratropium-albuterol  3 mL Nebulization Q6H  . methylPREDNISolone (SOLU-MEDROL) injection  60 mg Intravenous Q6H  . zinc sulfate  220 mg Oral Daily   Continuous Infusions: . sodium chloride      Principal Problem:   Acute respiratory failure with hypoxia (HCC) Active Problems:   Tachycardia   Chronic back pain greater than 3 months duration   Anxiety and depression   HTN (hypertension)   Bluish skin discoloration    Time spent: 45 minutes    [place] NP  [facility]  If 7PM-7AM, please contact night-coverage at [email or web address], password [facility] 11/18/2019, 11:03 AM  LOS: 0 days       Patient was seen, examined,treatment plan was discussed with the Advance Practice Provider.  I have personally reviewed the clinical findings, labs, EKG, imaging studies and management of this patient in detail. I have also reviewed the orders written for this patient which were under my direction. I agree with the documentation, as recorded by the Advance Practice Provider.   Jehad Baack is a 54 y.o. male with recent COVID infection.  Comes in  with acute respiratory infection. Multiple issues 1.  Chronic pain-- non-focal-- c/o pain all over-- asking for increasing amounts of pain meds, will try non-narcotic options 2. Anxiety - suspect this is the driving force of most issues 3.  Skin discoloration-- removed with alcohol pad 4.  Acute respiratory failure- wean Navarro as able,  steroids, able to speak in complete sentences, pro-calcitonin neg so no abx 5.  Recent COVID infection-- tested positive again here-- was discussed by admitting MD with ID and they state no need for airborne preautions    Geradine Girt, DO  How to contact the [facility] Attending or Consulting provider [place] or covering provider during after hours [place], for this patient?  1. Check the care team in [facility] and look for a) attending/consulting [facility] provider listed and b) the [facility] team listed 2. Log into [email or web address] and use [place]'s universal password to access. If you do not have the password, please contact the hospital operator. 3. Locate the [facility] provider you are looking for under [facility] and page to a number that you can be directly reached. 4. If you still have difficulty reaching the provider, please page the [facility] (Director on Call) for the Hospitalists listed on amion for assistance.

## 2019-11-18 NOTE — ED Notes (Addendum)
Lab called with critical result of +COVID test. Spoke with Marlowe Sax, MD who advised she spoke with ID team and they advised we would not be treating COVID, but rather general respiratory symptoms, due to first positive test on 12/15 and outside of the 21 day protocol.

## 2019-11-18 NOTE — ED Notes (Signed)
Assumed care of pt. Pt alert, in NAD. Maintaining sats on 2L [place]. Pt speaking in full sentences. Call light within reach. Denies any needs at this time. Will continue to monitor

## 2019-11-18 NOTE — ED Notes (Signed)
Pt reporting relief in pain. Meal tray provided. Gabapentin given per MAR. Name/DOB verified with pt. Hourly rounds completed. Pt denies any needs at this time. Will continue to monitor

## 2019-11-18 NOTE — ED Notes (Signed)
Report given to [place], Therapist, sports. All questions answered

## 2019-11-18 NOTE — ED Notes (Signed)
PRN Pain medications given per MAR. Name/DOB verified with pt

## 2019-11-18 NOTE — ED Notes (Signed)
Paged admitting regarding pain medications.

## 2019-11-18 NOTE — Telephone Encounter (Signed)
Patient is in the hospital

## 2019-11-19 MED ORDER — ORAL CARE MOUTH RINSE
15.0000 mL | Freq: Two times a day (BID) | OROMUCOSAL | Status: DC
  Administered 2019-11-19 – 2019-11-21 (×4): 15 mL via OROMUCOSAL

## 2019-11-19 MED ORDER — DOCUSATE SODIUM 100 MG PO CAPS
100.0000 mg | ORAL_CAPSULE | Freq: Two times a day (BID) | ORAL | Status: DC
  Administered 2019-11-19 – 2019-11-21 (×4): 100 mg via ORAL
  Filled 2019-11-19 (×4): qty 1

## 2019-11-19 MED ORDER — BISACODYL 10 MG RE SUPP: 10.0000 mg | Freq: Every day | RECTAL | Status: DC | PRN

## 2019-11-19 NOTE — Progress Notes (Signed)
[facility] PROGRESS NOTE  Daniel Durham [id number] DOB: 03/27/1966 DOA: 11/17/2019 PCP: Brunetta Jeans, PA-C   Daniel Durham is a 54 y.o. male with medical history significant of hypertension, hyperlipidemia, neuromuscular disorder, neuropathy, anxiety, depression, asthma presenting to the ED for evaluation of shortness of breath.  He was recently diagnosed with COVID-19 on 12/15 and admitted to [facility] until 12/26.  Patient states he has not felt better since he left the hospital.  He has continued to feel short of breath even at rest.  He was not sent home on oxygen.  He is coughing so aggressively that it is causing him to have substernal, sharp chest pain.  He also feels dizzy when coughing.  No hemoptysis.  He is using his home nebulizer 4 times a day and albuterol MDI as needed but there has been no improvement in his symptoms.  States while in the hospital he was given a codeine cough syrup which helped with his cough.    Assessment/Plan:  1.  Acute respiratory failure with hypoxia likely related to recent covid/asthma exacerbation.  Oxygen saturation level greater than 90% on room air at rest dropped to 86% with ambulation.  CTA negative for PE.  CT did reveal multifocal areas of mixed groundglass and consolidative opacities throughout the lungs overall slightly decreased from the prior CT done 4 weeks ago.  tested positive again here-HE SHOULD NOT HAVE BEEN TESTED AGAIN- was discussed by admitting MD with ID and they state no need for airborne precautions -Continue Solu-Medrol -Add flutter valve -Antitussive as needed -wean O2 as able -Monitor closely  2.  Hypertension.  Blood pressure elevated at presentation.  Then went to the low end of normal.  Then normotensive.  This morning blood pressure is on the low end of normal.  Home medications include lisinopril -Holding lisinopril for now -We will resume as indicated -As needed hydralazine  3.   Anxiety/depression.  Patient is quite anxious.  Home medications include Xanax, Atarax, Zoloft. -Continue home medicines -Monitor  4.  Chronic pain.  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  History of spinal cord stimulator.  Home medications include Flexeril, Neurontin, Percocet. Reports his chronic pain is worse with all the coughing. -Resume Flexeril and Neurontin -Provide Percocet -provide non-narcotic options -less complaints of pain today  5.  Skin discoloration.  Patient very anxious about newish green discoloration bilateral upper extremities.  His upper extremities are warm to the touch.  - alcohol swab was able to clean this discoloration off.  6. Obesity Estimated body mass index is 40.1 kg/m as calculated from the following:   Height as of 09/21/19: 5\' 7"  (1.702 m).   Weight as of 09/21/19: 116.1 kg.  Code Status: full Family Communication: patient at bedside Disposition Plan: home hopefully 24-48 hours     HPI/Subjective: Focused on when he can get his next pain/anxiety medications  Objective: Vitals:   11/18/19 2120 11/19/19 0535  BP: (!) 125/92 105/74  Pulse: 82 95  Resp:    Temp: 97.7 F (36.5 C) 97.9 F (36.6 C)  SpO2: 96% 97%    Intake/Output Summary (Last 24 hours) at 11/19/2019 1514 Last data filed at 11/19/2019 1500 Gross per 24 hour  Intake 720 ml  Output 2000 ml  Net -1280 ml   There were no vitals filed for this visit.  Exam:   General: A+Ox3  Cardiovascular: rrr  Respiratory: moving good air, slightly diminished but no wheezing and he appears comfortable  Abdomen: obese  Musculoskeletal: left below elbow amputation  Data Reviewed: Basic Metabolic Panel: Recent Labs  Lab 11/17/19 1525  NA 142  K 4.0  CL 104  CO2 23  GLUCOSE 138*  BUN 8  CREATININE 1.08  CALCIUM 9.4   Liver Function Tests: Recent Labs  Lab 11/17/19 1750  AST 44*  ALT 65*  ALKPHOS 84  BILITOT 0.7  PROT 7.2  ALBUMIN 3.6   No results for  input(s): LIPASE, AMYLASE in the last 168 hours. No results for input(s): AMMONIA in the last 168 hours. CBC: Recent Labs  Lab 11/17/19 1525  WBC 7.9  HGB 15.8  HCT 47.4  MCV 100.9*  PLT 235   Cardiac Enzymes: No results for input(s): CKTOTAL, CKMB, CKMBINDEX, TROPONINI in the last 168 hours. BNP (last 3 results) Recent Labs    02/10/19 1630 11/17/19 1750  BNP 15.9 16.2    ProBNP (last 3 results) No results for input(s): PROBNP in the last 8760 hours.  CBG: No results for input(s): GLUCAP in the last 168 hours.  Recent Results (from the past 240 hour(s))  SARS CORONAVIRUS 2 (TAT 6-24 HRS) Nasopharyngeal Nasopharyngeal Swab     Status: Abnormal   Collection Time: 11/17/19 10:34 PM   Specimen: Nasopharyngeal Swab  Result Value Ref Range Status   SARS Coronavirus 2 POSITIVE (A) NEGATIVE Final    Comment: RESULT CALLED TO, READ BACK BY AND VERIFIED WITH: B. MONTEE,RN [id number] 11/18/2019 T. TYSOR (NOTE) SARS-CoV-2 target nucleic acids are DETECTED. The SARS-CoV-2 RNA is generally detectable in upper and lower respiratory specimens during the acute phase of infection. Positive results are indicative of the presence of SARS-CoV-2 RNA. Clinical correlation with patient history and other diagnostic information is  necessary to determine patient infection status. Positive results do not rule out bacterial infection or co-infection with other viruses.  The expected result is Negative. Fact Sheet for Patients: SugarRoll.be Fact Sheet for Healthcare Providers: https://www.woods-mathews.com/ This test is not yet approved or cleared by the [place] FDA and  has been authorized for detection and/or diagnosis of SARS-CoV-2 by FDA under an Emergency Use Authorization (EUA). This EUA will remain  in effect (meaning this test can be used) for t he duration of the COVID-19 declaration under Section 564(b)(1) of the Act, 21 U.S.C. section  360bbb-3(b)(1), unless the authorization is terminated or revoked sooner. Performed at [facility], [place] [address], [place], [place] [address]      Studies: CT Angio Chest PE W and/or Wo Contrast  Result Date: 11/17/2019 CLINICAL DATA:  COVID-19 diagnosis 10/18/2019, shortness of breath with exertion with central chest pain EXAM: CT ANGIOGRAPHY CHEST WITH CONTRAST TECHNIQUE: Multidetector CT imaging of the chest was performed using the standard protocol during bolus administration of intravenous contrast. Multiplanar CT image reconstructions and MIPs were obtained to evaluate the vascular anatomy. CONTRAST:  69mL OMNIPAQUE IOHEXOL 350 MG/ML SOLN COMPARISON:  Same-day radiograph, CTA 10/21/2019 FINDINGS: Cardiovascular: Satisfactory opacification of the pulmonary arteries. Distal evaluation of the segmental and subsegmental pulmonary arteries is limited due to respiratory motion artifact. No central or lobar filling defects are identified. Central pulmonary arteries are normal caliber. Normal heart size. No pericardial effusion. The aorta is normal caliber. Normal 3 vessel branching of the aortic arch. Proximal great vessels opacify normally. Mediastinum/Nodes: Scattered low-attenuation subcentimeter mediastinal and hilar lymph nodes are preserved nodal architecture are likely reactive. No pathologically enlarged mediastinal, hilar or axillary adenopathy. Thyroid gland and thoracic inlet are free of acute abnormality.  No acute abnormality of the trachea or esophagus either. Lungs/Pleura: Multifocal areas of mixed ground-glass and consolidative opacities present throughout the lungs overall slightly decreased in extent from comparison CTA 10/21/2019. Some superimposed areas of interlobular septal thickening. More bandlike areas of opacity reflecting atelectasis or developing scarring. Airways appear diffusely thickened with some scattered secretions. Dependent atelectasis seen posteriorly. No  pneumothorax. No effusion. Upper Abdomen: No acute abnormalities present in the visualized portions of the upper abdomen. Partial fatty replacement of the pancreas is noted. Musculoskeletal: No acute osseous abnormality or suspicious osseous lesion. Minimal degenerative changes are present in the spine and shoulders. Cervical fusion hardware and left shoulder rotator cuff surgical anchors are noted. Review of the MIP images confirms the above findings. IMPRESSION: 1. Distal evaluation of the segmental and subsegmental pulmonary arteries is limited due to respiratory motion artifact. No central or lobar filling defects are identified. 2. Multifocal areas of mixed ground-glass and consolidative opacities present throughout the lungs overall slightly decreased in extent from comparison CTA 10/21/2019. Diffuse bronchial wall thickening with some scattered secretions. Findings are compatible with a multifocal pneumonia only slightly improved from comparison study. 3. Some superimposed areas interlobular septal thickening may reflect edema. 4. Bandlike areas of opacity could reflect atelectasis or developing scarring. Electronically Signed   By: Lovena Le M.D.   On: 11/17/2019 21:20   DG Chest Port 1 View  Result Date: 11/17/2019 CLINICAL DATA:  COVID-19 diagnosed 1 month prior with hospitalization, continued shortness of breath and dyspnea on exertion EXAM: PORTABLE CHEST 1 VIEW COMPARISON:  Radiograph 11/07/2019 FINDINGS: There are persistent areas of mixed interstitial and airspace opacity with a lower lung predominance which are similar to comparison radiograph 11/07/2019. Findings are worrisome for continued infection or possible superinfection. No visible pneumothorax or effusion. Cardiomediastinal contours are similar to prior though partially obscured by overlying opacity. No acute osseous or soft tissue abnormality. Cervical fusion hardware is noted. Left rotator cuff surgical anchors are noted.  IMPRESSION: Persistent areas of mixed interstitial and airspace opacity with a lower lung predominance. Findings are worrisome for continued infection or possible superinfection. Electronically Signed   By: Lovena Le M.D.   On: 11/17/2019 18:35    Scheduled Meds: . vitamin C  500 mg Oral Daily  . budesonide  1 puff Inhalation BID  . docusate sodium  100 mg Oral BID  . enoxaparin (LOVENOX) injection  55 mg Subcutaneous Q24H  . gabapentin  600 mg Oral TID  . Ipratropium-Albuterol  1 puff Inhalation Q6H  . mouth rinse  15 mL Mouth Rinse BID  . methylPREDNISolone (SOLU-MEDROL) injection  60 mg Intravenous Q6H  . sertraline  50 mg Oral Daily  . zinc sulfate  220 mg Oral Daily   Continuous Infusions:   Principal Problem:   Acute respiratory failure with hypoxia (HCC) Active Problems:   Chronic back pain greater than 3 months duration   Anxiety and depression   Bluish skin discoloration   HTN (hypertension)   Tachycardia    Time spent: 45 minutes    [facility]  If 7PM-7AM, please contact night-coverage at [email or web address], password [facility] 11/19/2019, 3:14 PM  LOS: 1 day

## 2019-11-19 NOTE — Progress Notes (Signed)
Transferred patient to 3W.  Gave report to [employer].  Pt.  Transported to 3w in bed in stable condition.

## 2019-11-20 LAB — CBC
HCT: 38.5 % — ABNORMAL LOW (ref 39.0–52.0)
Hemoglobin: 12.9 g/dL — ABNORMAL LOW (ref 13.0–17.0)
MCH: 33.5 pg (ref 26.0–34.0)
MCHC: 33.5 g/dL (ref 30.0–36.0)
MCV: 100 fL (ref 80.0–100.0)
Platelets: 242 10*3/uL (ref 150–400)
RBC: 3.85 MIL/uL — ABNORMAL LOW (ref 4.22–5.81)
RDW: 13.1 % (ref 11.5–15.5)
WBC: 13.6 10*3/uL — ABNORMAL HIGH (ref 4.0–10.5)
nRBC: 0 % (ref 0.0–0.2)

## 2019-11-20 LAB — BASIC METABOLIC PANEL
Anion gap: 10 (ref 5–15)
BUN: 20 mg/dL (ref 6–20)
CO2: 25 mmol/L (ref 22–32)
Calcium: 9.2 mg/dL (ref 8.9–10.3)
Chloride: 103 mmol/L (ref 98–111)
Creatinine, Ser: 0.85 mg/dL (ref 0.61–1.24)
GFR calc Af Amer: >60 mL/min (ref >60)
GFR calc non Af Amer: >60 mL/min (ref >60)
Glucose, Bld: 162 mg/dL — ABNORMAL HIGH (ref 70–99)
Potassium: 4.6 mmol/L (ref 3.5–5.1)
Sodium: 138 mmol/L (ref 135–145)

## 2019-11-20 MED ORDER — METHYLPREDNISOLONE SODIUM SUCC 40 MG IJ SOLR
40.0000 mg | Freq: Four times a day (QID) | INTRAMUSCULAR | Status: DC
  Administered 2019-11-20 – 2019-11-21 (×4): 40 mg via INTRAVENOUS
  Filled 2019-11-20 (×6): qty 1

## 2019-11-20 MED ORDER — IPRATROPIUM-ALBUTEROL 20-100 MCG/ACT IN AERS
1.0000 | INHALATION_SPRAY | Freq: Three times a day (TID) | RESPIRATORY_TRACT | Status: DC
  Administered 2019-11-20 – 2019-11-21 (×3): 1 via RESPIRATORY_TRACT

## 2019-11-20 NOTE — Progress Notes (Signed)
[facility] PROGRESS NOTE  Akeen Beh [id number] DOB: [birth year] DOA: 11/17/2019 PCP: Brunetta Jeans, PA-C   Daniel Durham is a 54 y.o. male with medical history significant of hypertension, hyperlipidemia, neuromuscular disorder, neuropathy, anxiety, depression, asthma presenting to the ED for evaluation of shortness of breath.  He was recently diagnosed with COVID-19 on 12/15 and admitted to [facility] until 12/26.  Patient states he has not felt better since he left the hospital.  He has continued to feel short of breath even at rest.  He was not sent home on oxygen.  He is coughing so aggressively that it is causing him to have substernal, sharp chest pain.  He also feels dizzy when coughing.  No hemoptysis.  He is using his home nebulizer 4 times a day and albuterol MDI as needed but there has been no improvement in his symptoms.  States while in the hospital he was given a codeine cough syrup which helped with his cough.    Assessment/Plan:  1.  Acute respiratory failure with hypoxia likely related to recent covid/asthma exacerbation.  Oxygen saturation level greater than 90% on room air at rest dropped to 86% with ambulation.  CTA negative for PE.  CT did reveal multifocal areas of mixed groundglass and consolidative opacities throughout the lungs overall slightly decreased from the prior CT done 4 weeks ago.  tested positive again here-HE SHOULD NOT HAVE BEEN TESTED AGAIN- was discussed by admitting MD with ID and they state no need for airborne precautions -Continue Solu-Medrol but start to wean -flutter valve -Antitussive as needed -wean O2 as able -Monitor closely  2.  Hypertension.  Blood pressure elevated at presentation.  Then went to the low end of normal.  Then normotensive.  This morning blood pressure is on the low end of normal.  Home medications include lisinopril -Holding lisinopril for now -We will resume as indicated -As needed hydralazine  3.   Anxiety/depression.  Patient is quite anxious.  Home medications include Xanax, Atarax, Zoloft. -Continue home medicines -Monitor  4.  Chronic pain.  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  History of spinal cord stimulator.  Home medications include Flexeril, Neurontin, Percocet. Reports his chronic pain is worse with all the coughing. -Resume Flexeril and Neurontin -Provide Percocet -provide non-narcotic options -less complaints of pain today  5.  Skin discoloration.  Patient very anxious about newish green discoloration bilateral upper extremities.  His upper extremities are warm to the touch.  - alcohol swab was able to clean this discoloration off.  6. Obesity Estimated body mass index is 40.1 kg/m as calculated from the following:   Height as of 09/21/19: 5\' 7"  (1.702 m).   Weight as of 09/21/19: 116.1 kg.  Code Status: full Family Communication: patient at bedside Disposition Plan: home hopefully 24-48 hours-- wean off O2- may need to go home on O2 for short term s/p COVID -19     HPI/Subjective: Breathing overall better at rest but still gets SOB when moving  Objective: Vitals:   11/20/19 0346 11/20/19 0734  BP: (!) 138/94 (!) 132/91  Pulse: 70 70  Resp: 19   Temp: 97.7 F (36.5 C) 97.7 F (36.5 C)  SpO2: 92% 92%    Intake/Output Summary (Last 24 hours) at 11/20/2019 1126 Last data filed at 11/19/2019 1500 Gross per 24 hour  Intake 240 ml  Output 400 ml  Net -160 ml   There were no vitals filed for this visit.  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 No results for input(s): AMMONIA in the last 168 hours. CBC: Recent Labs  Lab 11/17/19 1525 11/20/19 0718  WBC 7.9 13.6*  HGB 15.8 12.9*  HCT 47.4 38.5*  MCV 100.9* 100.0  PLT 235 242   Cardiac Enzymes: No results for input(s): CKTOTAL, CKMB, CKMBINDEX, TROPONINI in the last 168 hours. BNP (last 3 results) Recent Labs    02/10/19 1630 11/17/19 1750  BNP 15.9 16.2    ProBNP (last 3 results) No results for input(s): PROBNP in the last 8760 hours.  CBG: No results for input(s): GLUCAP in the last 168 hours.  Recent Results (from the past 240 hour(s))  SARS CORONAVIRUS 2 (TAT 6-24 HRS) Nasopharyngeal Nasopharyngeal Swab     Status: Abnormal   Collection Time: 11/17/19 10:34 PM   Specimen: Nasopharyngeal Swab  Result Value Ref Range Status   SARS Coronavirus 2 POSITIVE (A) NEGATIVE Final    Comment: RESULT CALLED TO, READ BACK BY AND VERIFIED WITH: B. MONTEE,RN [id number] 11/18/2019 T. TYSOR (NOTE) SARS-CoV-2 target nucleic acids are DETECTED. The SARS-CoV-2 RNA is generally detectable in upper and lower respiratory specimens during the acute phase of infection. Positive results are indicative of the presence of SARS-CoV-2 RNA. Clinical correlation with patient history and other diagnostic information is  necessary to determine patient infection status. Positive results do not rule out bacterial infection or co-infection with other viruses.  The expected result is Negative. Fact Sheet for Patients: SugarRoll.be Fact Sheet for Healthcare Providers: https://www.woods-mathews.com/ This test is not yet approved or cleared by the [place] FDA and  has been authorized for detection and/or diagnosis of SARS-CoV-2 by FDA under an Emergency Use Authorization (EUA). This EUA will remain  in effect (meaning this test can be used) for t he duration of  the COVID-19 declaration under Section 564(b)(1) of the Act, 21 U.S.C. section 360bbb-3(b)(1), unless the authorization is terminated or revoked sooner. Performed at [facility], [place] [address], [place],  [address]      Studies: No results found.  Scheduled Meds: . vitamin C  500 mg Oral Daily  . budesonide  1 puff Inhalation BID  . docusate sodium  100 mg Oral BID  . enoxaparin (LOVENOX) injection  55 mg Subcutaneous Q24H  . gabapentin  600 mg Oral TID  . Ipratropium-Albuterol  1 puff Inhalation TID  . mouth rinse  15 mL Mouth Rinse BID  . methylPREDNISolone (SOLU-MEDROL) injection  60 mg Intravenous Q6H  . sertraline  50 mg Oral Daily  . zinc sulfate  220 mg Oral Daily   Continuous Infusions:   Principal Problem:   Acute respiratory failure with hypoxia (HCC) Active Problems:   Chronic back pain greater than 3 months duration   Anxiety and depression   Bluish skin discoloration   HTN (hypertension)   Tachycardia    Time spent: 25 minutes  [place] [facility]  If 7PM-7AM, please contact night-coverage at [email or web address], password [facility] 11/20/2019, 11:26 AM  LOS: 2 days

## 2019-11-20 NOTE — Telephone Encounter (Signed)
His contract with [place] does not exclude him from receiving treatment in the hospital that the provider deems appropriate.

## 2019-11-21 MED ORDER — IPRATROPIUM-ALBUTEROL 0.5-2.5 (3) MG/3ML IN SOLN
3.0000 mL | Freq: Three times a day (TID) | RESPIRATORY_TRACT | Status: DC
  Administered 2019-11-21: 15:00:00 3 mL via RESPIRATORY_TRACT
  Filled 2019-11-21: qty 3

## 2019-11-21 MED ORDER — BUDESONIDE 0.5 MG/2ML IN SUSP: 0.5000 mg | Freq: Two times a day (BID) | RESPIRATORY_TRACT | 0 refills | Status: DC

## 2019-11-21 MED ORDER — ZINC SULFATE 220 (50 ZN) MG PO CAPS: 220.0000 mg | ORAL_CAPSULE | Freq: Every day | ORAL | 0 refills | Status: DC

## 2019-11-21 MED ORDER — PREDNISONE 10 MG PO TABS: ORAL_TABLET | ORAL | 0 refills | Status: DC

## 2019-11-21 MED ORDER — IPRATROPIUM-ALBUTEROL 20-100 MCG/ACT IN AERS: 1.0000 | INHALATION_SPRAY | Freq: Three times a day (TID) | RESPIRATORY_TRACT | 0 refills | Status: DC

## 2019-11-21 MED ORDER — GUAIFENESIN-CODEINE 100-10 MG/5ML PO SOLN: 5.0000 mL | ORAL | 0 refills | Status: DC | PRN

## 2019-11-21 MED ORDER — IPRATROPIUM-ALBUTEROL 0.5-2.5 (3) MG/3ML IN SOLN: 3.0000 mL | Freq: Three times a day (TID) | RESPIRATORY_TRACT | 0 refills | Status: DC

## 2019-11-21 MED ORDER — DOCUSATE SODIUM 100 MG PO CAPS: 100.0000 mg | ORAL_CAPSULE | Freq: Two times a day (BID) | ORAL | 0 refills | Status: DC

## 2019-11-21 MED ORDER — BUDESONIDE 180 MCG/ACT IN AEPB: 1.0000 | INHALATION_SPRAY | Freq: Two times a day (BID) | RESPIRATORY_TRACT | 0 refills | Status: DC

## 2019-11-21 NOTE — Progress Notes (Signed)
Communicated with Dr. Eliseo Squires.  Combivent changed to Duoneb.  Duoneb administered.

## 2019-11-21 NOTE — Discharge Summary (Addendum)
Physician Discharge Summary  Daniel Durham [id number] DOB: [birth year] DOA: 11/17/2019  PCP: Brunetta Jeans, PA-C  Admit date: 11/17/2019 Discharge date: 11/21/2019  Time spent: 45 minutes  Recommendations for Outpatient Follow-up:  1. Follow up with PCP 1-2 weeks for evaluation of respiratory status. May consider OP PFT and/or pulm consult if indicated.  2. Nebulizer ordered as patient unable to do inhaler due to lack of left arm   Discharge Diagnoses:  Principal Problem:   Acute respiratory failure with hypoxia (HCC) Active Problems:   Chronic back pain greater Daniel 3 months duration   Anxiety and depression   Bluish skin discoloration   HTN (hypertension)   Tachycardia   Discharge Condition: stable  Diet recommendation: heart healthy  There were no vitals filed for this visit.  History of present illness:  Daniel Durham is a 54 y.o. male with medical history significant of hypertension, hyperlipidemia, neuromuscular disorder, neuropathy, anxiety, depression, asthma presenting to the ED 11/17/19 for evaluation of shortness of breath.  He was  diagnosed with COVID-19 on 12/15 and admitted to [facility] until 12/26.  Patient stated he had not felt better since he left the hospital.  He continued to feel short of breath even at rest.  He was not sent home on oxygen.  He was coughing so aggressively that it caused him to have substernal, sharp chest pain.  He also felt dizzy when coughing.  No hemoptysis.  He was using his home nebulizer 4 times a day and albuterol MDI as needed but there had been no improvement in his symptoms.  Stated while in the hospital he was given a codeine cough syrup which helped with his cough.  His appetite  good but he continued to feel very weak.  No nausea, vomiting, abdominal pain, or diarrhea.  No fevers.  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  He  noticed that the skin of both of his arms was  starting purple.  In addition, noticed that his right hand had a green discoloration.  He initially thought this was because his hand was not clean so he tried scrubbing it several times but there was no improvement.   Hospital Course:  1.  Acute respiratory failure with hypoxia likely related to recent covid/asthma exacerbation. Resolved at discharge.  Oxygen saturation level greater Daniel 90% on room air at rest dropped to 86% with ambulation.  CTA negative for PE.  CT did reveal multifocal areas of mixed groundglass and consolidative opacities throughout the lungs overall slightly decreased from the prior CT done 4 weeks ago. tested positive again here-HE SHOULD NOT HAVE BEEN TESTED AGAIN- was discussed by admitting MD with ID and they state no need for airborne precautions. He received solumedrol that was gradually tapered. Provided flutter valve, nebs, inhaler, anti-tussive. Slowly improved. At discharge oxygen saturation level greater Daniel 90% on room air with ambulation. Will discharge with slow prednisone taper, inhaler, flutter valve and anti-tussive. Recommend 1-2 week follow up with PCP for evaluation of respiratory status.   2.  Hypertension.  Blood pressure elevated at presentation.  Then went to the low end of normal.  Then normotensive. BP controlled at discharge. Resume home meds.   3.  Anxiety/depression.  Quite anxious on admission. At baseline at discharge. Home medications include Xanax, Atarax, Zoloft.  4.  Chronic pain.  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  History of spinal cord stimulator.  Home medications include Flexeril, Neurontin,  Percocet. Reports his chronic pain is worse with all the coughing.  5.  Skin discoloration.  Patient very anxious about newish green discoloration bilateral upper extremities.  His upper extremities are warm to the touch.  - alcohol swab was able to clean this discoloration off.  6. Obesity Estimated body mass index is 40.1 kg/m as  calculated from the following:   Height as of 09/21/19: 5\' 7"  (1.702 m).   Weight as of 09/21/19  Procedures:    Consultations:    Discharge Exam: Vitals:   11/21/19 0902 11/21/19 1224  BP: (!) 142/72 140/75  Pulse: 89 89  Resp: 16 16  Temp: 98 F (36.7 C) 98.5 F (36.9 C)  SpO2: 94% 93%    General: obese sitting on side of bed watching tv. No acute distress Cardiovascular: rrr no mgr no LE edema Respiratory: normal effort with conversation. Voice is hoarse. BS with good air movement I hear no wheeze or rhonchi or crackles  Discharge Instructions   Discharge Instructions    Call MD for:  difficulty breathing, headache or visual disturbances   Complete by: As directed    Call MD for:  persistant dizziness or light-headedness   Complete by: As directed    Call MD for:  temperature >100.4   Complete by: As directed    Diet - low sodium heart healthy   Complete by: As directed    Discharge instructions   Complete by: As directed    Take medications as prescribed Increase activity slowly Follow up with PCP 1-2 weeks for evaluation of respiratory status   Increase activity slowly   Complete by: As directed    MyChart COVID-19 home monitoring program   Complete by: Nov 21, 2019    Is the patient willing to use the [facility] for home monitoring?: Yes   Temperature monitoring   Complete by: Nov 21, 2019    After how many days would you like to receive a notification of this patient's flowsheet entries?: 1     Allergies as of 11/21/2019      Reactions   Aspirin Swelling   Hydrocodone Itching      Medication List    STOP taking these medications   benzonatate 100 MG capsule Commonly known as: TESSALON   guaiFENesin 600 MG 12 hr tablet Commonly known as: MUCINEX   Mucinex Fast-Max Chest Cong MS 100 MG/5ML liquid Generic drug: guaiFENesin   NyQuil HBP Cold & Flu 15-6.25-325 MG/15ML Liqd Generic drug: DM-Doxylamine-Acetaminophen   Sklice 0.5 %  Lotn Generic drug: Ivermectin     TAKE these medications   albuterol (2.5 MG/3ML) 0.083% nebulizer solution Commonly known as: PROVENTIL Take 3 mLs (2.5 mg total) by nebulization every 6 (six) hours as needed for wheezing or shortness of breath. What changed: Another medication with the same name was removed. Continue taking this medication, and follow the directions you see here.   ALPRAZolam 0.25 MG tablet Commonly known as: XANAX Take 1 tablet (0.25 mg total) by mouth 2 (two) times daily as needed for anxiety.   atorvastatin 10 MG tablet Commonly known as: LIPITOR TAKE 1 TABLET(10 MG) BY MOUTH DAILY What changed: See the new instructions.   budesonide 0.5 MG/2ML nebulizer solution Commonly known as: PULMICORT Take 2 mLs (0.5 mg total) by nebulization 2 (two) times daily.   cyclobenzaprine 10 MG tablet Commonly known as: FLEXERIL Take 10 mg by mouth 3 (three) times daily as needed for muscle spasms.   docusate sodium  100 MG capsule Commonly known as: COLACE Take 1 capsule (100 mg total) by mouth 2 (two) times daily.   feeding supplement Liqd Take 237 mLs by mouth 3 (three) times daily with meals.   gabapentin 600 MG tablet Commonly known as: NEURONTIN Take 1 tablet (600 mg total) by mouth 3 (three) times daily.   guaiFENesin-codeine 100-10 MG/5ML syrup Take 5 mLs by mouth every 4 (four) hours as needed for cough.   HM Multivitamin Adult Gummy Chew Chew 2 tablets by mouth daily.   hydrOXYzine 25 MG tablet Commonly known as: ATARAX/VISTARIL Take 1 tablet (25 mg total) by mouth 3 (three) times daily as needed. What changed: reasons to take this   ipratropium-albuterol 0.5-2.5 (3) MG/3ML Soln Commonly known as: DUONEB Take 3 mLs by nebulization 3 (three) times daily.   lisinopril 20 MG tablet Commonly known as: ZESTRIL TAKE 1 TABLET(20 MG) BY MOUTH DAILY What changed: See the new instructions.   Melatonin 3 MG Tabs Take 3 mg by mouth at bedtime as needed  (sleep).   montelukast 10 MG tablet Commonly known as: SINGULAIR TAKE 1 TABLET(10 MG) BY MOUTH AT BEDTIME What changed: See the new instructions.   oxyCODONE-acetaminophen 10-325 MG tablet Commonly known as: PERCOCET TK 1 T PO Q 6 H PRN What changed:   how much to take  how to take this  when to take this  reasons to take this   Pedialyte Soln Take 120 mLs by mouth 2 (two) times daily.   predniSONE 10 MG tablet Commonly known as: DELTASONE Take 6 tabs daily for 3 days starting 1/19, then take 4 tabs daily for 3 days, then take 2 tabs daily for 3 days then take 1 tab daily for 3 days.   sertraline 50 MG tablet Commonly known as: Zoloft Take 1 tablet (50 mg total) by mouth daily.   tiZANidine 2 MG tablet Commonly known as: ZANAFLEX Take 2 mg by mouth every 8 (eight) hours as needed for muscle spasms.   zinc sulfate 220 (50 Zn) MG capsule Take 1 capsule (220 mg total) by mouth daily. Start taking on: November 22, 2019            Durable Medical Equipment  (From admission, onward)         Start     Ordered   11/21/19 1501  For home use only DME Nebulizer/meds  Once    Comments: Missing forearm so unable to do inhalers  Question Answer Comment  Patient needs a nebulizer to treat with the following condition Acute asthma exacerbation   Length of Need Lifetime      11/21/19 1500         Allergies  Allergen Reactions  . Aspirin Swelling  . Hydrocodone Itching      The results of significant diagnostics from this hospitalization (including imaging, microbiology, ancillary and laboratory) are listed below for reference.    Significant Diagnostic Studies: CT Angio Chest PE W and/or Wo Contrast  Result Date: 11/17/2019 CLINICAL DATA:  COVID-19 diagnosis 10/18/2019, shortness of breath with exertion with central chest pain EXAM: CT ANGIOGRAPHY CHEST WITH CONTRAST TECHNIQUE: Multidetector CT imaging of the chest was performed using the standard protocol  during bolus administration of intravenous contrast. Multiplanar CT image reconstructions and MIPs were obtained to evaluate the vascular anatomy. CONTRAST:  63mL OMNIPAQUE IOHEXOL 350 MG/ML SOLN COMPARISON:  Same-day radiograph, CTA 10/21/2019 FINDINGS: Cardiovascular: Satisfactory opacification of the pulmonary arteries. Distal evaluation of the segmental and subsegmental pulmonary  arteries is limited due to respiratory motion artifact. No central or lobar filling defects are identified. Central pulmonary arteries are normal caliber. Normal heart size. No pericardial effusion. The aorta is normal caliber. Normal 3 vessel branching of the aortic arch. Proximal great vessels opacify normally. Mediastinum/Nodes: Scattered low-attenuation subcentimeter mediastinal and hilar lymph nodes are preserved nodal architecture are likely reactive. No pathologically enlarged mediastinal, hilar or axillary adenopathy. Thyroid gland and thoracic inlet are free of acute abnormality. No acute abnormality of the trachea or esophagus either. Lungs/Pleura: Multifocal areas of mixed ground-glass and consolidative opacities present throughout the lungs overall slightly decreased in extent from comparison CTA 10/21/2019. Some superimposed areas of interlobular septal thickening. More bandlike areas of opacity reflecting atelectasis or developing scarring. Airways appear diffusely thickened with some scattered secretions. Dependent atelectasis seen posteriorly. No pneumothorax. No effusion. Upper Abdomen: No acute abnormalities present in the visualized portions of the upper abdomen. Partial fatty replacement of the pancreas is noted. Musculoskeletal: No acute osseous abnormality or suspicious osseous lesion. Minimal degenerative changes are present in the spine and shoulders. Cervical fusion hardware and left shoulder rotator cuff surgical anchors are noted. Review of the MIP images confirms the above findings. IMPRESSION: 1. Distal  evaluation of the segmental and subsegmental pulmonary arteries is limited due to respiratory motion artifact. No central or lobar filling defects are identified. 2. Multifocal areas of mixed ground-glass and consolidative opacities present throughout the lungs overall slightly decreased in extent from comparison CTA 10/21/2019. Diffuse bronchial wall thickening with some scattered secretions. Findings are compatible with a multifocal pneumonia only slightly improved from comparison study. 3. Some superimposed areas interlobular septal thickening may reflect edema. 4. Bandlike areas of opacity could reflect atelectasis or developing scarring. Electronically Signed   By: Lovena Le M.D.   On: 11/17/2019 21:20   DG Chest Port 1 View  Result Date: 11/17/2019 CLINICAL DATA:  COVID-19 diagnosed 1 month prior with hospitalization, continued shortness of breath and dyspnea on exertion EXAM: PORTABLE CHEST 1 VIEW COMPARISON:  Radiograph 11/07/2019 FINDINGS: There are persistent areas of mixed interstitial and airspace opacity with a lower lung predominance which are similar to comparison radiograph 11/07/2019. Findings are worrisome for continued infection or possible superinfection. No visible pneumothorax or effusion. Cardiomediastinal contours are similar to prior though partially obscured by overlying opacity. No acute osseous or soft tissue abnormality. Cervical fusion hardware is noted. Left rotator cuff surgical anchors are noted. IMPRESSION: Persistent areas of mixed interstitial and airspace opacity with a lower lung predominance. Findings are worrisome for continued infection or possible superinfection. Electronically Signed   By: Lovena Le M.D.   On: 11/17/2019 18:35    Microbiology: Recent Results (from the past 240 hour(s))  SARS CORONAVIRUS 2 (TAT 6-24 HRS) Nasopharyngeal Nasopharyngeal Swab     Status: Abnormal   Collection Time: 11/17/19 10:34 PM   Specimen: Nasopharyngeal Swab  Result Value  Ref Range Status   SARS Coronavirus 2 POSITIVE (A) NEGATIVE Final    Comment: RESULT CALLED TO, READ BACK BY AND VERIFIED WITH: B. MONTEE,RN [id number] 11/18/2019 T. TYSOR (NOTE) SARS-CoV-2 target nucleic acids are DETECTED. The SARS-CoV-2 RNA is generally detectable in upper and lower respiratory specimens during the acute phase of infection. Positive results are indicative of the presence of SARS-CoV-2 RNA. Clinical correlation with patient history and other diagnostic information is  necessary to determine patient infection status. Positive results do not rule out bacterial infection or co-infection with other viruses.  The expected result is Negative. Fact  Sheet for Patients: SugarRoll[employer]be Fact Sheet for Healthcare Providers: https://www.woods-mathews.com/ This test is not yet approved or cleared by the [place] FDA and  has been authorized for detection and/or diagnosis of SARS-CoV-2 by FDA under an Emergency Use Authorization (EUA). This EUA will remain  in effect (meaning this test can be used) for t he duration of the COVID-19 declaration under Section 564(b)(1) of the Act, 21 U.S.C. section 360bbb-3(b)(1), unless the authorization is terminated or revoked sooner. Performed at [facility], [place] [address], [place], [place] [address]      Labs: Basic Metabolic Panel: Recent Labs  Lab 11/17/19 1525 11/20/19 0718  NA 142 138  K 4.0 4.6  CL 104 103  CO2 23 25  GLUCOSE 138* 162*  BUN 8 20  CREATININE 1.08 0.85  CALCIUM 9.4 9.2   Liver Function Tests: Recent Labs  Lab 11/17/19 1750  AST 44*  ALT 65*  ALKPHOS 84  BILITOT 0.7  PROT 7.2  ALBUMIN 3.6   No results for input(s): LIPASE, AMYLASE in the last 168 hours. No results for input(s): AMMONIA in the last 168 hours. CBC: Recent Labs  Lab 11/17/19 1525 11/20/19 0718  WBC 7.9 13.6*  HGB 15.8 12.9*  HCT 47.4 38.5*  MCV 100.9* 100.0  PLT 235 242   Cardiac  Enzymes: No results for input(s): CKTOTAL, CKMB, CKMBINDEX, TROPONINI in the last 168 hours. BNP: BNP (last 3 results) Recent Labs    02/10/19 1630 11/17/19 1750  BNP 15.9 16.2    ProBNP (last 3 results) No results for input(s): PROBNP in the last 8760 hours.  CBG: No results for input(s): GLUCAP in the last 168 hours.     Signed:  Eulogio Bear DO [facility] 11/21/2019, 3:15 PM

## 2019-11-21 NOTE — TOC Transition Note (Addendum)
Transition of Care [facility]) - CM/SW Discharge Note   Patient Details  Name: Daniel Durham MRN: [id number] Date of Birth: [birth year]  [facility] CM/SW Contact:  Pollie Friar, RN Phone Number: 11/21/2019, 12:19 PM   Clinical Narrative:    Pt discharging home with self care. Pt did not qualify for home oxygen with his ambulatory saturation test.  Pt has hospital f/u, insurance and transportation home.   Addendum: MD put in late order for nebulizer machine. Pt states he has one at home. Pt with nebulizer solution ordered for home.   Final next level of care: Home/Self Care Barriers to Discharge: No Barriers Identified   Patient Goals and CMS Choice        Discharge Placement                       Discharge Plan and Services                                     Social Determinants of Health (SDOH) Interventions     Readmission Risk Interventions No flowsheet data found.

## 2019-11-21 NOTE — Progress Notes (Signed)
AMBULATED IN HALLWAY WITH CANE, ~100 FEET. RA. ADMITS TO FEELING DECONDITIONED, UPON RETURNING TO BEDSIDE, O2 SAT 95% WITH NO RESP DISTRESS OBSERVED. AP STACH 105. NO CHEST DISCOMFORT REPORTED.

## 2019-11-21 NOTE — Plan of Care (Signed)
Min assist with adls 

## 2019-11-22 ENCOUNTER — Other Ambulatory Visit: Payer: Self-pay

## 2019-11-22 ENCOUNTER — Ambulatory Visit (INDEPENDENT_AMBULATORY_CARE_PROVIDER_SITE_OTHER): Payer: Medicare HMO | Admitting: Physician Assistant

## 2019-11-22 ENCOUNTER — Encounter: Payer: Self-pay | Admitting: Physician Assistant

## 2019-11-22 VITALS — HR 88 | Temp 99.7°F

## 2019-11-22 DIAGNOSIS — J9601 Acute respiratory failure with hypoxia: Secondary | ICD-10-CM | POA: Diagnosis not present

## 2019-11-22 DIAGNOSIS — F419 Anxiety disorder, unspecified: Secondary | ICD-10-CM | POA: Diagnosis not present

## 2019-11-22 DIAGNOSIS — F329 Major depressive disorder, single episode, unspecified: Secondary | ICD-10-CM | POA: Diagnosis not present

## 2019-11-22 DIAGNOSIS — J4530 Mild persistent asthma, uncomplicated: Secondary | ICD-10-CM | POA: Diagnosis not present

## 2019-11-22 DIAGNOSIS — I10 Essential (primary) hypertension: Secondary | ICD-10-CM

## 2019-11-22 DIAGNOSIS — F32A Depression, unspecified: Secondary | ICD-10-CM

## 2019-11-22 MED ORDER — BENZONATATE 100 MG PO CAPS: 100.0000 mg | ORAL_CAPSULE | Freq: Three times a day (TID) | ORAL | 0 refills | Status: DC | PRN

## 2019-11-22 NOTE — Progress Notes (Signed)
Virtual Visit via Video   I connected with patient on 11/23/19 at 10:30 AM EST by a video enabled telemedicine application and verified that I am speaking with the correct person using two identifiers.  Location patient: Home Location provider: [facility] Persons participating in the virtual visit: Patient, Provider, Orestes Denita Lung)  I discussed the limitations of evaluation and management by telemedicine and the availability of in person appointments. The patient expressed understanding and agreed to proceed.  Subjective:   HPI:   Patient presents via doxy.need today for hospital follow-up.  Patient presented to ER on 11/17/2019 with continued significant SOB and hypoxia at the request of this provider.  Patient already admitted to the hospital previously on 10/18/2019 with Covid and seen again in ER on 11/07/2019 for ongoing shortness of breath. ER workup on 11/17/2019 included assessment of vitals revealing O2 91 to 93% on room air at rest but dropping down to 86% with ambulation.  Troponins negative.  EKG without acute changes.  Inflammatory markers including D-dimer, LDH, ferritin and fibrinogen mildly elevated.  Mild elevation in liver function noted.  Chest x-ray revealed persistent areas of mixed interstitial and airspace opacity with lower lung predominance.  CT angiogram ordered and negative for pulmonary embolism.  Patient given IV fluids and albuterol in the emergency room. Patient subsequently admitted to the hospital for observation and further management.   During hospitalization, patient received IV Solu-Medrol that was gradually tapered.  Was giving nebulizer treatments and antitussives.  Slowly improved throughout hospitalization.  Blood pressure initially low-normal but came back to normal level with hydration.  Pain controlled during hospitalization.  At discharge O2 sats greater than 90 on room air with ambulation.  Was discharged home on 11/21/2019 with  prednisone taper, DuoNeb, budesonide nebulizer solution, flutter valve and codeine cough syrup.  Was recommended to have outpatient follow-up with both PCP and outpatient pulmonary consult with consideration for PFTs.   Since discharge, patient endorses taking her medications as directed with the exception of the cough syrup.  States since he is on a pain medication he was scared to take the codeine cough syrup for fear of getting too much medication in his system.  Wanted to discuss first.  States breathing is improved but coughing spells are still persistent.  Notes chest wall pain with coughing and shortness of breath after coughing spell.  Still getting winded with exertion but O2 level staying greater than 90%.  Currently at 94% after having to walk from the bathroom.  Patient denies any new or worsening symptoms.  Denies fever or chills.  Notes anxiety has been up due to everything he is gone through in the past month.  Is coming back down to baseline at present.  Is taking his anxiety medications as directed and still tolerating well.  Notes good intake and output.  ROS:   See pertinent positives and negatives per HPI.  Patient Active Problem List   Diagnosis Date Noted  . HTN (hypertension) 11/18/2019  . Acute respiratory failure with hypoxia ([place]) 11/17/2019  . Colon cancer screening 05/25/2018  . Anxiety and depression 03/03/2017  . Cervical disc disorder with radiculopathy of cervical region 07/24/2015  . 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 Visit for preventive health examination 02/04/2015  . Chronic back pain greater than 3 months duration 01/23/2015  . 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 Amputation stump complication ([place])  11/20/2014    Social History   Tobacco Use  . Smoking status: Never Smoker  . Smokeless tobacco: Never Used  Substance Use Topics  .  Alcohol use: Yes    Alcohol/week: 0.0 standard drinks    Comment: occ    Current Outpatient Medications:  .  albuterol (PROVENTIL) (2.5 MG/3ML) 0.083% nebulizer solution, Take 3 mLs (2.5 mg total) by nebulization every 6 (six) hours as needed for wheezing or shortness of breath., Disp: 180 mL, Rfl: 2 .  ALPRAZolam (XANAX) 0.25 MG tablet, Take 1 tablet (0.25 mg total) by mouth 2 (two) times daily as needed for anxiety., Disp: 30 tablet, Rfl: 0 .  atorvastatin (LIPITOR) 10 MG tablet, TAKE 1 TABLET(10 MG) BY MOUTH DAILY (Patient taking differently: Take 10 mg by mouth daily at 6 PM. ), Disp: 90 tablet, Rfl: 1 .  benzonatate (TESSALON) 100 MG capsule, Take 1 capsule (100 mg total) by mouth 3 (three) times daily as needed for cough., Disp: 30 capsule, Rfl: 0 .  budesonide (PULMICORT) 0.5 MG/2ML nebulizer solution, Take 2 mLs (0.5 mg total) by nebulization 2 (two) times daily., Disp: 60 mL, Rfl: 0 .  cyclobenzaprine (FLEXERIL) 10 MG tablet, Take 10 mg by mouth 3 (three) times daily as needed for muscle spasms. , Disp: , Rfl:  .  docusate sodium (COLACE) 100 MG capsule, Take 1 capsule (100 mg total) by mouth 2 (two) times daily., Disp: 10 capsule, Rfl: 0 .  feeding supplement (ENSURE IMMUNE HEALTH) LIQD, Take 237 mLs by mouth 3 (three) times daily with meals., Disp: , Rfl:  .  gabapentin (NEURONTIN) 600 MG tablet, Take 1 tablet (600 mg total) by mouth 3 (three) times daily., Disp: 270 tablet, Rfl: 1 .  guaiFENesin-codeine 100-10 MG/5ML syrup, Take 5 mLs by mouth every 4 (four) hours as needed for cough., Disp: 120 mL, Rfl: 0 .  hydrOXYzine (ATARAX/VISTARIL) 25 MG tablet, Take 1 tablet (25 mg total) by mouth 3 (three) times daily as needed. (Patient taking differently: Take 25 mg by mouth 3 (three) times daily as needed for anxiety. ), Disp: 30 tablet, Rfl: 0 .  ipratropium-albuterol (DUONEB) 0.5-2.5 (3) MG/3ML SOLN, Take 3 mLs by nebulization 3 (three) times daily., Disp: 360 mL, Rfl: 0 .  lisinopril  (ZESTRIL) 20 MG tablet, TAKE 1 TABLET(20 MG) BY MOUTH DAILY (Patient taking differently: Take 20 mg by mouth daily. ), Disp: 90 tablet, Rfl: 1 .  Melatonin 3 MG TABS, Take 3 mg by mouth at bedtime as needed (sleep)., Disp: , Rfl:  .  montelukast (SINGULAIR) 10 MG tablet, TAKE 1 TABLET(10 MG) BY MOUTH AT BEDTIME (Patient taking differently: Take 10 mg by mouth at bedtime. TAKE 1 TABLET(10 MG) BY MOUTH AT BEDTIME), Disp: 90 tablet, Rfl: 1 .  Multiple Vitamins-Minerals (HM MULTIVITAMIN ADULT GUMMY) CHEW, Chew 2 tablets by mouth daily. , Disp: , Rfl:  .  oxyCODONE-acetaminophen (PERCOCET) 10-325 MG tablet, TK 1 T PO Q 6 H PRN (Patient taking differently: Take 1 tablet by mouth every 6 (six) hours as needed for pain. TK 1 T PO Q 6 H PRN), Disp: 120 tablet, Rfl: 0 .  Pedialyte (PEDIALYTE) SOLN, Take 120 mLs by mouth 2 (two) times daily., Disp: , Rfl:  .  predniSONE (DELTASONE) 10 MG tablet, Take 6 tabs daily for 3 days starting 1/19, then take 4 tabs daily for 3 days, then take 2 tabs daily for 3 days then take 1 tab daily for 3 days., Disp: 35 tablet, Rfl: 0 .  sertraline (ZOLOFT) 50 MG tablet, Take 1 tablet (50 mg total) by mouth daily., Disp: 90 tablet, Rfl: 1 .  tiZANidine (ZANAFLEX) 2 MG tablet, Take 2 mg by mouth every 8 (eight) hours as needed for muscle spasms. , Disp: , Rfl: 1 .  zinc sulfate 220 (50 Zn) MG capsule, Take 1 capsule (220 mg total) by mouth daily., Disp: 30 capsule, Rfl: 0  Allergies  Allergen Reactions  . Aspirin Swelling  . Hydrocodone Itching    Objective:   Pulse 88   Temp 99.7 F (37.6 C) (Temporal)   SpO2 93%   Patient is well-developed, well-nourished in no acute distress.  Resting comfortably at home.  Head is normocephalic, atraumatic.  No labored breathing.  Does get winded when exerting himself. Speech is clear and coherent with logical content.  Patient is alert and oriented at baseline.   Assessment and Plan:   1. Acute respiratory failure with hypoxia  (HCC) Secondary to Covid with subsequent asthma exacerbation.  Clinically resolving.  O2 sat are good today even with recent exertion.  Patient to complete entire prednisone taper.  Continue nebulizer solutions as directed.  Placed urgent referral to pulmonology for outpatient follow-up.  He has been scheduled for tomorrow.  Strict ER precautions reviewed with patient. - Ambulatory referral to Pulmonology  2. Essential hypertension Initially low-normal during hospitalization.  Returned to baseline on discharge.  Patient is to have his wife check blood pressure when she returns home.  He is to record and update [place] with these numbers.  We will continue current regimen for now.  3. Anxiety and depression Initially deteriorated recently but is coming back to baseline.  Patient declines any change in medication regimen at present.    Leeanne Rio, PA-C 11/23/2019

## 2019-11-22 NOTE — Progress Notes (Signed)
I have discussed the procedure for the virtual visit with the patient who has given consent to proceed with assessment and treatment.   Daniel Durham N Margree Gimbel, CMA      

## 2019-11-23 ENCOUNTER — Encounter: Payer: Self-pay | Admitting: Internal Medicine

## 2019-11-23 ENCOUNTER — Ambulatory Visit: Payer: Medicare HMO | Admitting: Internal Medicine

## 2019-11-23 VITALS — BP 110/80 | HR 101 | Temp 98.3°F | Ht 67.0 in | Wt 241.4 lb

## 2019-11-23 DIAGNOSIS — R0600 Dyspnea, unspecified: Secondary | ICD-10-CM

## 2019-11-23 DIAGNOSIS — R5383 Other fatigue: Secondary | ICD-10-CM | POA: Diagnosis not present

## 2019-11-23 DIAGNOSIS — Z09 Encounter for follow-up examination after completed treatment for conditions other than malignant neoplasm: Secondary | ICD-10-CM | POA: Diagnosis not present

## 2019-11-23 DIAGNOSIS — Z8616 Personal history of COVID-19: Secondary | ICD-10-CM

## 2019-11-23 DIAGNOSIS — Z89112 Acquired absence of left hand: Secondary | ICD-10-CM

## 2019-11-23 DIAGNOSIS — U071 COVID-19: Secondary | ICD-10-CM

## 2019-11-23 MED ORDER — DOCUSATE SODIUM 100 MG PO CAPS: 100.0000 mg | ORAL_CAPSULE | Freq: Two times a day (BID) | ORAL | 11 refills | Status: DC

## 2019-11-23 MED ORDER — SULFAMETHOXAZOLE-TRIMETHOPRIM 800-160 MG PO TABS: 1.0000 | ORAL_TABLET | ORAL | 1 refills | Status: DC

## 2019-11-23 MED ORDER — PREDNISONE 20 MG PO TABS: ORAL_TABLET | ORAL | 0 refills | Status: DC

## 2019-11-23 NOTE — Patient Instructions (Addendum)
-   Stopped hydroxyzine, tessalon - Start docusate - Continue your nebulzer - Prednisone as outlined, start bactrim 3x/week - Increase activity as tolerated - Telephone appt with Dr. Vaughan Browner in 4 weeks
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
## 2019-12-13 ENCOUNTER — Other Ambulatory Visit: Payer: Self-pay | Admitting: Physician Assistant

## 2019-12-13 DIAGNOSIS — M5010 Cervical disc disorder with radiculopathy, unspecified cervical region: Secondary | ICD-10-CM

## 2019-12-13 DIAGNOSIS — G8929 Other chronic pain: Secondary | ICD-10-CM

## 2019-12-13 DIAGNOSIS — F32A Depression, unspecified: Secondary | ICD-10-CM

## 2019-12-13 DIAGNOSIS — F329 Major depressive disorder, single episode, unspecified: Secondary | ICD-10-CM

## 2019-12-13 DIAGNOSIS — F419 Anxiety disorder, unspecified: Secondary | ICD-10-CM

## 2019-12-13 MED ORDER — OXYCODONE-ACETAMINOPHEN 10-325 MG PO TABS: ORAL_TABLET | ORAL | 0 refills | Status: DC

## 2019-12-13 MED ORDER — ALPRAZOLAM 0.25 MG PO TABS: 0.2500 mg | ORAL_TABLET | Freq: Two times a day (BID) | ORAL | 0 refills | Status: DC | PRN

## 2019-12-13 NOTE — Telephone Encounter (Signed)
Indication for chronic opioid: Chronic back pain, Spinal Stenosis, Phantom Limp Pain Medication and dose: Oxycodone-APAP 10/325 mg # pills per month: 120 on 11/17/2019 Last UDS date: 12/24/18 Opioid Treatment Agreement signed (Y/N): Yes Opioid Treatment Agreement last reviewed with patient:   [facility] reviewed this encounter (include red flags):     Xanax last rx 11/17/19 #30  LOV: 11/22/19 HTN, Anxiety/Depression CSC: 09/28/2018

## 2019-12-23 DIAGNOSIS — J4530 Mild persistent asthma, uncomplicated: Secondary | ICD-10-CM | POA: Diagnosis not present

## 2019-12-27 ENCOUNTER — Ambulatory Visit (INDEPENDENT_AMBULATORY_CARE_PROVIDER_SITE_OTHER): Payer: Medicare HMO | Admitting: Pulmonary Disease

## 2019-12-27 ENCOUNTER — Other Ambulatory Visit: Payer: Self-pay

## 2019-12-27 ENCOUNTER — Encounter: Payer: Self-pay | Admitting: Pulmonary Disease

## 2019-12-27 DIAGNOSIS — U071 COVID-19: Secondary | ICD-10-CM | POA: Diagnosis not present

## 2019-12-27 DIAGNOSIS — R0600 Dyspnea, unspecified: Secondary | ICD-10-CM

## 2019-12-27 DIAGNOSIS — T879 Unspecified complications of amputation stump: Secondary | ICD-10-CM

## 2019-12-27 NOTE — Progress Notes (Signed)
Virtual Visit via Telephone Note  I connected with Daniel Durham on 12/27/19 at 10:30 AM EST by telephone and verified that I am speaking with the correct person using two identifiers.  Location: Patient: Home Provider: [facility], [place], [place]   I discussed the limitations, risks, security and privacy concerns of performing an evaluation and management service by telephone and the availability of in person appointments. I also discussed with the patient that there may be a patient responsible charge related to this service. The patient expressed understanding and agreed to proceed.  History of Present Illness: 54 year old with hypertension, hyperlipidemia, neuromuscular disorder, neuropathy, anxiety, depression, asthma  Diagnosed with COVID-19 on 10/18/2019.  Admitted to [facility] on 12/26 and stayed for a week. He has not felt well since his discharge. Hospitalized at [facility] in from 1/14-1/18/2021 with persistent dyspnea, acute respiratory failure with hypoxia which is treated with prednisone. Evaluated by Dr. Tamala Julian in pulmonary clinic on 1/20 and restarted on slow prednisone taper for persistent symptoms  Pets:Has dogs, rabbits, chickens and dogs at home. 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  No down pillows or comforter Smoking history: He is a non-smoker Travel history: No significant travel Relevant family history: No significant family of lung disease  mMRC-3   Observations/Objective: Continues on prednisone.  He was initially on 40 mg/day and has transition to 20 mg/day this week Continues on Bactrim prophylaxis States that his breathing is slightly better compared to before.  Duo nebs really helped with his breathing Cough has improved.  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 The patient was provided an opportunity to ask questions and all were answered. The patient agreed with the plan and demonstrated an understanding of the instructions.   The patient was advised to call back or seek an in-person evaluation if the symptoms worsen or if the condition fails to improve as anticipated.  I provided 25 minutes of non-face-to-face time during this encounter.   Marshell Garfinkel MD [facility] 12/27/2019, 10:56 AM

## 2019-12-27 NOTE — Patient Instructions (Signed)
We will schedule high-resolution CT, PFTs for assessment of your lung function after COVID-19 Continue the prednisone taper, DuoNeb  Follow-up in 1 to 2 months

## 2019-12-29 ENCOUNTER — Telehealth: Payer: Self-pay

## 2019-12-29 NOTE — Telephone Encounter (Signed)
Patient called in stating that his legs are turning green. States that at the beginning of January, his feet started peeling. Within a few weeks of picking at the skin, he noticed he has pulled all the dry/dead skin from both feet and that they were "raw and red." The green color started in his toes a few weeks ago, but has moved all the way up the legs to almost his hip bones.   I advised patient to be immediately go to either ER or UC.

## 2019-12-29 NOTE — Telephone Encounter (Signed)
Pt absolutely needs ER assessment

## 2019-12-30 NOTE — Telephone Encounter (Signed)
Please verify patient went to the ER for evaluation. I do not see any ER visits within [facility] but I know they live near [facility].

## 2019-12-30 NOTE — Telephone Encounter (Signed)
Spoke with patient, he did not go to ER or UC. Urged patient that he needs to go to ER ASAP. States he will go sometime this morning.

## 2020-01-08 ENCOUNTER — Other Ambulatory Visit: Payer: Self-pay | Admitting: Physician Assistant

## 2020-01-08 DIAGNOSIS — F329 Major depressive disorder, single episode, unspecified: Secondary | ICD-10-CM

## 2020-01-08 DIAGNOSIS — M549 Dorsalgia, unspecified: Secondary | ICD-10-CM

## 2020-01-08 DIAGNOSIS — F32A Depression, unspecified: Secondary | ICD-10-CM

## 2020-01-08 DIAGNOSIS — M5010 Cervical disc disorder with radiculopathy, unspecified cervical region: Secondary | ICD-10-CM

## 2020-01-08 DIAGNOSIS — F419 Anxiety disorder, unspecified: Secondary | ICD-10-CM

## 2020-01-08 DIAGNOSIS — G8929 Other chronic pain: Secondary | ICD-10-CM

## 2020-01-09 MED ORDER — OXYCODONE-ACETAMINOPHEN 10-325 MG PO TABS: ORAL_TABLET | ORAL | 0 refills | Status: DC

## 2020-01-09 MED ORDER — ALPRAZOLAM 0.25 MG PO TABS: 0.2500 mg | ORAL_TABLET | Freq: Two times a day (BID) | ORAL | 0 refills | Status: DC | PRN

## 2020-01-09 NOTE — Telephone Encounter (Signed)
Indication for chronic opioid: Chronic back pain, Spinal Stenosis, Chronic OA Medication and dose: Percocet 10/325 mg  # pills per month: 120 on 12/13/19 Last UDS date: 12/24/18 Opioid Treatment Agreement signed (Y/N): Yes, 09/28/18 Opioid Treatment Agreement last reviewed with patient:   [facility] reviewed this encounter (include red flags):     Xanax last rx 12/13/19 #30 LOV: 11/22/19 Acute respiratory

## 2020-02-06 ENCOUNTER — Emergency Department (HOSPITAL_BASED_OUTPATIENT_CLINIC_OR_DEPARTMENT_OTHER)
Admission: EM | Admit: 2020-02-06 | Discharge: 2020-02-06 | Disposition: A | Discharge status: Home / Self Care | Payer: Medicare HMO | Attending: Emergency Medicine | Admitting: Emergency Medicine

## 2020-02-06 ENCOUNTER — Encounter (HOSPITAL_BASED_OUTPATIENT_CLINIC_OR_DEPARTMENT_OTHER): Payer: Self-pay | Admitting: *Deleted

## 2020-02-06 ENCOUNTER — Other Ambulatory Visit: Payer: Self-pay | Admitting: Physician Assistant

## 2020-02-06 ENCOUNTER — Encounter: Payer: Self-pay | Admitting: Physician Assistant

## 2020-02-06 ENCOUNTER — Telehealth (INDEPENDENT_AMBULATORY_CARE_PROVIDER_SITE_OTHER): Payer: Medicare HMO | Admitting: Physician Assistant

## 2020-02-06 ENCOUNTER — Emergency Department (HOSPITAL_BASED_OUTPATIENT_CLINIC_OR_DEPARTMENT_OTHER): Payer: Medicare HMO

## 2020-02-06 ENCOUNTER — Other Ambulatory Visit: Payer: Self-pay

## 2020-02-06 ENCOUNTER — Other Ambulatory Visit: Payer: Self-pay | Admitting: Emergency Medicine

## 2020-02-06 DIAGNOSIS — R1011 Right upper quadrant pain: Secondary | ICD-10-CM

## 2020-02-06 DIAGNOSIS — K3189 Other diseases of stomach and duodenum: Secondary | ICD-10-CM | POA: Diagnosis not present

## 2020-02-06 DIAGNOSIS — M549 Dorsalgia, unspecified: Secondary | ICD-10-CM | POA: Diagnosis not present

## 2020-02-06 DIAGNOSIS — Z79899 Other long term (current) drug therapy: Secondary | ICD-10-CM | POA: Diagnosis not present

## 2020-02-06 DIAGNOSIS — R109 Unspecified abdominal pain: Secondary | ICD-10-CM | POA: Diagnosis present

## 2020-02-06 DIAGNOSIS — M5010 Cervical disc disorder with radiculopathy, unspecified cervical region: Secondary | ICD-10-CM

## 2020-02-06 DIAGNOSIS — G8929 Other chronic pain: Secondary | ICD-10-CM | POA: Diagnosis not present

## 2020-02-06 DIAGNOSIS — I10 Essential (primary) hypertension: Secondary | ICD-10-CM | POA: Insufficient documentation

## 2020-02-06 DIAGNOSIS — K760 Fatty (change of) liver, not elsewhere classified: Secondary | ICD-10-CM | POA: Diagnosis not present

## 2020-02-06 DIAGNOSIS — I517 Cardiomegaly: Secondary | ICD-10-CM | POA: Diagnosis not present

## 2020-02-06 LAB — COMPREHENSIVE METABOLIC PANEL
ALT: 46 U/L — ABNORMAL HIGH (ref 0–44)
AST: 39 U/L (ref 15–41)
Albumin: 4.2 g/dL (ref 3.5–5.0)
Alkaline Phosphatase: 60 U/L (ref 38–126)
Anion gap: 14 (ref 5–15)
BUN: 10 mg/dL (ref 6–20)
CO2: 22 mmol/L (ref 22–32)
Calcium: 9 mg/dL (ref 8.9–10.3)
Chloride: 101 mmol/L (ref 98–111)
Creatinine, Ser: 1 mg/dL (ref 0.61–1.24)
GFR calc Af Amer: >60 mL/min (ref >60)
GFR calc non Af Amer: >60 mL/min (ref >60)
Glucose, Bld: 100 mg/dL — ABNORMAL HIGH (ref 70–99)
Potassium: 3.7 mmol/L (ref 3.5–5.1)
Sodium: 137 mmol/L (ref 135–145)
Total Bilirubin: 0.5 mg/dL (ref 0.3–1.2)
Total Protein: 7.5 g/dL (ref 6.5–8.1)

## 2020-02-06 LAB — URINALYSIS, ROUTINE W REFLEX MICROSCOPIC
Bilirubin Urine: NEGATIVE
Glucose, UA: NEGATIVE mg/dL
Hgb urine dipstick: NEGATIVE
Ketones, ur: NEGATIVE mg/dL
Leukocytes,Ua: NEGATIVE
Nitrite: NEGATIVE
Protein, ur: NEGATIVE mg/dL
Specific Gravity, Urine: 1.015 (ref 1.005–1.030)
pH: 6 (ref 5.0–8.0)

## 2020-02-06 LAB — CBC
HCT: 44.9 % (ref 39.0–52.0)
Hemoglobin: 15.5 g/dL (ref 13.0–17.0)
MCH: 34.4 pg — ABNORMAL HIGH (ref 26.0–34.0)
MCHC: 34.5 g/dL (ref 30.0–36.0)
MCV: 99.8 fL (ref 80.0–100.0)
Platelets: 203 10*3/uL (ref 150–400)
RBC: 4.5 MIL/uL (ref 4.22–5.81)
RDW: 13 % (ref 11.5–15.5)
WBC: 7.5 10*3/uL (ref 4.0–10.5)
nRBC: 0 % (ref 0.0–0.2)

## 2020-02-06 LAB — LIPASE, BLOOD: Lipase: 24 U/L (ref 11–51)

## 2020-02-06 MED ORDER — IOHEXOL 300 MG/ML  SOLN
100.0000 mL | Freq: Once | INTRAMUSCULAR | Status: AC
  Administered 2020-02-06: 100 mL via INTRAVENOUS

## 2020-02-06 MED ORDER — GABAPENTIN 600 MG PO TABS: 600.0000 mg | ORAL_TABLET | Freq: Three times a day (TID) | ORAL | 1 refills | Status: DC

## 2020-02-06 MED ORDER — ONDANSETRON HCL 4 MG/2ML IJ SOLN
4.0000 mg | Freq: Once | INTRAMUSCULAR | Status: AC
  Administered 2020-02-06: 4 mg via INTRAVENOUS
  Filled 2020-02-06: qty 2

## 2020-02-06 MED ORDER — SODIUM CHLORIDE 0.9% FLUSH
3.0000 mL | Freq: Once | INTRAVENOUS | Status: DC
  Filled 2020-02-06: qty 3

## 2020-02-06 MED ORDER — FENTANYL CITRATE (PF) 100 MCG/2ML IJ SOLN
50.0000 ug | Freq: Once | INTRAMUSCULAR | Status: AC
  Administered 2020-02-06: 50 ug via INTRAVENOUS
  Filled 2020-02-06: qty 2

## 2020-02-06 MED ORDER — GABAPENTIN 300 MG PO CAPS: ORAL_CAPSULE | ORAL | 3 refills | Status: DC

## 2020-02-06 MED ORDER — CYCLOBENZAPRINE HCL 10 MG PO TABS: 10.0000 mg | ORAL_TABLET | Freq: Every day | ORAL | 0 refills | Status: DC

## 2020-02-06 NOTE — Progress Notes (Signed)
I have discussed the procedure for the virtual visit with the patient who has given consent to proceed with assessment and treatment.   Roben Schliep S Denese Mentink, CMA     

## 2020-02-06 NOTE — ED Provider Notes (Signed)
[facility] EMERGENCY DEPARTMENT Provider Note   CSN: [id number] Arrival date & time: 02/06/20  2014     History Chief Complaint  Patient presents with  . Abdominal Pain    Daniel Durham is a 54 y.o. male.  The history is provided by the patient.  Abdominal Pain Pain location:  R flank, RUQ and epigastric Pain quality: aching   Pain radiates to:  Does not radiate Pain severity:  Mild Onset quality:  Gradual Duration:  5 days Timing:  Constant Progression:  Unchanged Chronicity:  New Context: not alcohol use, not eating and not previous surgeries   Relieved by:  Nothing Worsened by:  Movement Associated symptoms: no anorexia, no belching, no chest pain, no chills, no constipation, no cough, no dysuria, no fatigue, no fever, no flatus, no hematuria, no melena, no nausea, no shortness of breath, no sore throat and no vomiting   Risk factors: has not had multiple surgeries        Past Medical History:  Diagnosis Date  . Allergy   . Anxiety   . Asthma    SEASONAL   . Blood transfusion without reported diagnosis   . Chronic back pain    Neurostimulator  . Depression   . Environmental allergies   . Hypertension   . Neuromuscular disorder ([place])   . Neuropathy   . Seasonal allergic conjunctivitis     Patient Active Problem List   Diagnosis Date Noted  . Morbid obesity ([place]) 12/27/2019  . HTN (hypertension) 11/18/2019  . Acute respiratory failure with hypoxia ([place]) 11/17/2019  . Colon cancer screening 05/25/2018  . Anxiety and depression 03/03/2017  . Cervical disc disorder with radiculopathy of cervical region 07/24/2015  . 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 Visit for preventive health examination 02/04/2015  . Chronic back pain greater than 3 months duration 01/23/2015  . 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 Amputation stump  complication ([place]) [id number]    Past Surgical History:  Procedure Laterality Date  . BACK SURGERY    . CARDIAC CATHETERIZATION     11/05/10 [facility], [place]): [place] of mLAD, no sign disease. EF 45% in RAO, 60% LAO (51% stress, 54% rest by NM stress; 57-59% echo 10/2010)  . CARPAL TUNNEL RELEASE     Bilateral  . CERVICAL DISCECTOMY  12/17/2017   titanium plates 12-17-17  . COLONOSCOPY    . HAND AMPUTATION  2007  . POLYPECTOMY     pt states had colon in [place] and he had polyps- he has no idea what type   . ROTATOR CUFF REPAIR     Left  . SPINAL CORD STIMULATOR IMPLANT    . SPINAL CORD STIMULATOR REMOVAL N/A 10/26/2015   Procedure: LUMBAR SPINAL CORD STIMULATOR REMOVAL;  Surgeon: Clydell Hakim, MD;  Location: [place] [facility] NEURO ORS;  Service: Neurosurgery;  Laterality: N/A;       Family History  Problem Relation Age of Onset  . Diabetes Mother        Living  . Sleep apnea Mother   . Diabetes Father 79       Deceased  . Hypertension Father   . Jaundice Father   . Hemophilia Father   . Alcoholism Father   . Cancer Maternal Grandfather        Throat  . Esophageal cancer Maternal Grandfather   . Cancer Paternal Uncle  Throat  . Esophageal cancer Paternal Uncle   . Asthma Sister   . Diabetes Sister   . Healthy Son        X1  . Healthy Daughter        X1  . Colon polyps Neg Hx   . Colon cancer Neg Hx   . Rectal cancer Neg Hx   . Stomach cancer Neg Hx     Social History   Tobacco Use  . Smoking status: Never Smoker  . Smokeless tobacco: Never Used  Substance Use Topics  . Alcohol use: Yes    Alcohol/week: 0.0 standard drinks    Comment: occ  . Drug use: No    Home Medications Prior to Admission medications   Medication Sig Start Date End Date Taking? Authorizing Provider  ALPRAZolam (XANAX) 0.25 MG tablet Take 1 tablet (0.25 mg total) by mouth 2 (two) times daily as needed for anxiety. 01/09/20   Brunetta Jeans, PA-C  atorvastatin (LIPITOR) 10 MG  tablet TAKE 1 TABLET(10 MG) BY MOUTH DAILY Patient taking differently: Take 10 mg by mouth daily at 6 PM.  09/23/19   Brunetta Jeans, PA-C  cyclobenzaprine (FLEXERIL) 10 MG tablet Take 1 tablet (10 mg total) by mouth at bedtime. 02/06/20   Brunetta Jeans, PA-C  docusate sodium (COLACE) 100 MG capsule Take 1 capsule (100 mg total) by mouth 2 (two) times daily. 11/23/19   Candee Furbish, MD  feeding supplement [facility] IMMUNE HEALTH) LIQD Take 237 mLs by mouth 3 (three) times daily with meals.    [provider]  gabapentin (NEURONTIN) 300 MG capsule Take 1 capsule each morning and midday along with your 600 mg dose to make a total of 900 mg 02/06/20   Brunetta Jeans, PA-C  gabapentin (NEURONTIN) 600 MG tablet Take 1 tablet (600 mg total) by mouth 3 (three) times daily. 02/06/20   Brunetta Jeans, PA-C  ipratropium-albuterol (DUONEB) 0.5-2.5 (3) MG/3ML SOLN Take 3 mLs by nebulization 3 (three) times daily. 11/21/19   Geradine Girt, DO  lisinopril (ZESTRIL) 20 MG tablet TAKE 1 TABLET(20 MG) BY MOUTH DAILY Patient taking differently: Take 20 mg by mouth daily.  05/27/19   Brunetta Jeans, PA-C  Melatonin 3 MG TABS Take 3 mg by mouth at bedtime as needed (sleep).    [provider]  montelukast (SINGULAIR) 10 MG tablet  01/06/20   [provider]  Multiple Vitamins-Minerals (HM MULTIVITAMIN ADULT GUMMY) CHEW Chew 2 tablets by mouth daily.     [provider]  oxyCODONE-acetaminophen (PERCOCET) 10-325 MG tablet TK 1 T PO Q 6 H PRN 01/09/20   Brunetta Jeans, PA-C  sertraline (ZOLOFT) 50 MG tablet Take 1 tablet (50 mg total) by mouth daily. 10/17/19   Brunetta Jeans, PA-C  tiZANidine (ZANAFLEX) 2 MG tablet Take 2 mg by mouth every 8 (eight) hours as needed for muscle spasms.     [provider]  zinc sulfate 220 (50 Zn) MG capsule Take 1 capsule (220 mg total) by mouth daily. 11/22/19   Black, Lezlie Octave, NP    Allergies    Aspirin and  Hydrocodone  Review of Systems   Review of Systems  Constitutional: Negative for chills, fatigue and fever.  HENT: Negative for ear pain and sore throat.   Eyes: Negative for pain and visual disturbance.  Respiratory: Negative for cough and shortness of breath.   Cardiovascular: Negative for chest pain and palpitations.  Gastrointestinal: Positive  for abdominal pain. Negative for anorexia, constipation, flatus, melena, nausea and vomiting.  Genitourinary: Negative for dysuria and hematuria.  Musculoskeletal: Negative for arthralgias and back pain.  Skin: Negative for color change and rash.  Neurological: Negative for seizures and syncope.  All other systems reviewed and are negative.   Physical Exam Updated Vital Signs  ED Triage Vitals  Enc Vitals Group     BP 02/06/20 2020 (!) 145/97     Pulse Rate 02/06/20 2020 100     Resp 02/06/20 2020 18     Temp 02/06/20 2020 98.1 F (36.7 C)     Temp Source 02/06/20 2020 Oral     SpO2 02/06/20 2020 98 %     Weight 02/06/20 2018 250 lb (113.4 kg)     Height 02/06/20 2018 5\' 7"  (1.702 m)     Head Circumference --      Peak Flow --      Pain Score 02/06/20 2018 9     Pain Loc --      Pain Edu? --      Excl. in [place]? --     Physical Exam Vitals and nursing note reviewed.  Constitutional:      General: He is not in acute distress.    Appearance: He is well-developed. He is not ill-appearing.  HENT:     Head: Normocephalic and atraumatic.     Mouth/Throat:     Mouth: Mucous membranes are moist.     Pharynx: Oropharynx is clear.  Eyes:     Extraocular Movements: Extraocular movements intact.     Conjunctiva/sclera: Conjunctivae normal.  Cardiovascular:     Rate and Rhythm: Normal rate and regular rhythm.     Heart sounds: Normal heart sounds. No murmur.  Pulmonary:     Effort: Pulmonary effort is normal. No respiratory distress.     Breath sounds: Normal breath sounds.  Abdominal:     General: There is distension.      Palpations: Abdomen is soft.     Tenderness: There is abdominal tenderness in the right upper quadrant and epigastric area. There is no right CVA tenderness, left CVA tenderness, guarding or rebound. Negative signs include Murphy's sign, Rovsing's sign, McBurney's sign and psoas sign.  Musculoskeletal:     Cervical back: Neck supple.  Skin:    General: Skin is warm and dry.     Capillary Refill: Capillary refill takes less than 2 seconds.  Neurological:     General: No focal deficit present.     Mental Status: He is alert.  Psychiatric:        Mood and Affect: Mood normal.     ED Results / Procedures / Treatments   Labs (all labs ordered are listed, but only abnormal results are displayed) Labs Reviewed  COMPREHENSIVE METABOLIC PANEL - Abnormal; Notable for the following components:      Result Value   Glucose, Bld 100 (*)    ALT 46 (*)    All other components within normal limits  CBC - Abnormal; Notable for the following components:   MCH 34.4 (*)    All other components within normal limits  URINE CULTURE  LIPASE, BLOOD  URINALYSIS, ROUTINE W REFLEX MICROSCOPIC    EKG None  Radiology CT ABDOMEN PELVIS W CONTRAST  Result Date: 02/06/2020 CLINICAL DATA:  Abdominal distension. Right upper quadrant abdominal pain x1 week. EXAM: CT ABDOMEN AND PELVIS WITH CONTRAST TECHNIQUE: Multidetector CT imaging of the abdomen and pelvis was performed using  the standard protocol following bolus administration of intravenous contrast. CONTRAST:  174mL OMNIPAQUE IOHEXOL 300 MG/ML  SOLN COMPARISON:  None. FINDINGS: Lower chest: The lung bases are clear. The heart size is normal. Hepatobiliary: There is decreased hepatic attenuation suggestive of hepatic steatosis. Normal gallbladder.There is no biliary ductal dilation. Pancreas: Normal contours without ductal dilatation. No peripancreatic fluid collection. Spleen: Unremarkable. Adrenals/Urinary Tract: --Adrenal glands: Unremarkable. --Right  kidney/ureter: No hydronephrosis or radiopaque kidney stones. --Left kidney/ureter: No hydronephrosis or radiopaque kidney stones. --Urinary bladder: Unremarkable. Stomach/Bowel: --Stomach/Duodenum: The stomach is moderately distended. --Small bowel: Unremarkable. --Colon: Unremarkable. --Appendix: Normal. Vascular/Lymphatic: Normal course and caliber of the major abdominal vessels. --No retroperitoneal lymphadenopathy. --No mesenteric lymphadenopathy. --No pelvic or inguinal lymphadenopathy. Reproductive: Unremarkable Other: No ascites or free air. There are bilateral fat containing inguinal hernias. Musculoskeletal. No acute displaced fractures. IMPRESSION: 1. The stomach is moderately distended. 2. Hepatic steatosis. 3. Unremarkable appearance of the gallbladder by CT standards. 4. Normal appendix in the right lower quadrant. Electronically Signed   By: Constance Holster M.D.   On: 02/06/2020 22:16   DG Chest Portable 1 View  Result Date: 02/06/2020 CLINICAL DATA:  Right upper quadrant pain EXAM: PORTABLE CHEST 1 VIEW COMPARISON:  11/17/2019 FINDINGS: Cardiomegaly. No confluent opacities or effusions. No acute bony abnormality. IMPRESSION: Cardiomegaly.  No active disease. Electronically Signed   By: Rolm Baptise M.D.   On: 02/06/2020 21:16   US Abdomen Limited RUQ  Result Date: 02/06/2020 CLINICAL DATA:  54 year old male with right upper quadrant abdominal pain for 5 days, increasing today. EXAM: ULTRASOUND ABDOMEN LIMITED RIGHT UPPER QUADRANT COMPARISON:  CTA chest 11/17/2019. FINDINGS: Gallbladder: The gallbladder is not distended, and wall thickness remains normal at 2 mm. However, there does appear to be a small volume of dependent sludge, and several tiny calculi are suspected on the order of 2 mm. No pericholecystic fluid. The technologist reports a positive sonographic Murphy sign. Common bile duct: Diameter: 4 mm, normal. Liver: Echogenic liver (image 27). No discrete liver lesion. No intrahepatic  biliary ductal dilatation is evident. Portal vein is patent on color Doppler imaging with normal direction of blood flow towards the liver. Other: Negative visible right kidney. IMPRESSION: 1. Suspected tiny gallstones but no strong evidence of acute cholecystitis despite a reported sonographic Murphy sign. 2. No evidence of acute bile duct obstruction. 3. Hepatic steatosis. Electronically Signed   By: Genevie Ann M.D.   On: 02/06/2020 21:41    Procedures Procedures (including critical care time)  Medications Ordered in ED Medications  sodium chloride flush (NS) 0.9 % injection 3 mL (has no administration in time range)  fentaNYL (SUBLIMAZE) injection 50 mcg (50 mcg Intravenous Given 02/06/20 2052)  ondansetron (ZOFRAN) injection 4 mg (4 mg Intravenous Given 02/06/20 2052)  iohexol (OMNIPAQUE) 300 MG/ML solution 100 mL (100 mLs Intravenous Contrast Given 02/06/20 2201)    ED Course  I have reviewed the triage vital signs and the nursing notes.  Pertinent labs & imaging results that were available during my care of the patient were reviewed by me and considered in my medical decision making (see chart for details).    MDM Rules/Calculators/A&P                      Ryman Stfleur is a 54 year old male with history of chronic back pain, hypertension, anxiety who presents to the ED with right-sided abdominal pain for the last week.  Not sure if anything makes it worse or better.  Patient  with normal vitals.  No fever.  Was sent by primary doctor rule out gallbladder issues.  States that he ate food just prior to arrival but pain has not changed much.  No history of kidney stones.  Has mostly right-sided pain, epigastric pain on exam.  Ultrasound showed some tiny gallstones but no evidence of acute cholecystitis.  No evidence of acute bile duct obstruction.  Gallbladder and liver enzymes within normal limits.  Lipase normal.  Chest x-ray showed no infection, no pneumothorax.  No significant leukocytosis,  anemia, electrolyte abnormality.  Will get CT scan to further evaluate for kidney stone or other intra-abdominal process.  Possible that he may have some biliary colic or MSK type pain.  Urinalysis negative for infection as well.  CT scan shows no acute process.  No kidney stone, no bowel obstruction.  Overall possibly some biliary colic.  Pain has improved.  Educated about diet control.  Given information to follow-up with surgery if continues to have issues.  Could also be MSK pain.  Discharged in good condition.  This chart was dictated using voice recognition software.  Despite best efforts to proofread,  errors can occur which can change the documentation meaning.     Final Clinical Impression(s) / ED Diagnoses Final diagnoses:  RUQ pain    Rx / DC Orders ED Discharge Orders    None       Lennice Sites, DO 02/06/20 2240

## 2020-02-06 NOTE — Progress Notes (Signed)
Virtual Visit via Video   I connected with patient on 02/06/20 at  1:30 PM EDT by a video enabled telemedicine application and verified that I am speaking with the correct person using two identifiers.  Location patient: Home Location provider: [facility] Persons participating in the virtual visit: Patient, Provider, Ramona (Patina Moore)  I discussed the limitations of evaluation and management by telemedicine and the availability of in person appointments. The patient expressed understanding and agreed to proceed.  Subjective:   HPI:   Patient presents via [employer] today to discuss worsening of his chronic back pain. Notes increase in overall pain level with subtherapeutic status of his chronic medications since he had a prolonged COVID infection. Has slowly recuperated from this, still having SOBOE (followed by Pulmonology). Notes even with his medications pain is sitting above 7 consistently. Notes it is the same in nature, only increased in severity. Is taking his Oxycodone every 6 hours as directed along with Gabapentin 600 mg TID. Pain is affecting sleep which has impact on his mood. Wants to know next steps.  Patient also endorses 3 days of right sided ribe and RUQ pain that is now constant. Is sometimes worse with movement but not always. Denies radiation of pain elsewhere. States this pain feels different from any pains he has had before. Denies fever, chills, nausea or vomiting. Denies change in bowel habits. Pain is not worse with eating.    ROS:   See pertinent positives and negatives per HPI.  Patient Active Problem List   Diagnosis Date Noted  . Morbid obesity ([place]) 12/27/2019  . HTN (hypertension) 11/18/2019  . Acute respiratory failure with hypoxia ([place]) 11/17/2019  . Colon cancer screening 05/25/2018  . Anxiety and depression 03/03/2017  . Cervical disc disorder with radiculopathy of cervical region 07/24/2015  . 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 Visit for preventive health examination 02/04/2015  . Chronic back pain greater than 3 months duration 01/23/2015  . 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 Smoking status: Never Smoker  . Smokeless tobacco: Never Used  Substance Use Topics  . Alcohol use: Yes    Alcohol/week: 0.0 standard drinks    Comment: occ    Current Outpatient Medications:  .  ALPRAZolam (XANAX) 0.25 MG tablet, Take 1 tablet (0.25 mg total) by mouth 2 (two) times daily as needed for anxiety., Disp: 30 tablet, Rfl: 0 .  atorvastatin (LIPITOR) 10 MG tablet, TAKE 1 TABLET(10 MG) BY MOUTH DAILY (Patient taking differently: Take 10 mg by mouth daily at 6 PM. ), Disp: 90 tablet, Rfl: 1 .  cyclobenzaprine (FLEXERIL) 10 MG tablet, Take 10 mg by mouth 3 (three) times daily as needed for muscle spasms. , Disp: , Rfl:  .  docusate sodium (COLACE) 100 MG capsule, Take 1 capsule (100 mg total) by mouth 2 (two) times daily., Disp: 60 capsule, Rfl: 11 .  feeding supplement (ENSURE IMMUNE HEALTH) LIQD, Take 237 mLs by mouth 3 (three) times daily with meals., Disp: , Rfl:  .  gabapentin (NEURONTIN) 600 MG tablet, Take 1 tablet (600 mg total) by mouth 3 (three) times daily., Disp: 270 tablet, Rfl: 1 .  ipratropium-albuterol (DUONEB) 0.5-2.5 (3) MG/3ML SOLN, Take 3 mLs by nebulization 3 (three) times daily., Disp: 360 mL, Rfl: 0 .  lisinopril (ZESTRIL)  20 MG tablet, TAKE 1 TABLET(20 MG) BY MOUTH DAILY (Patient taking differently: Take 20 mg by mouth daily. ), Disp: 90 tablet, Rfl: 1 .  Melatonin 3 MG TABS, Take 3 mg by mouth at bedtime as needed (sleep)., Disp: , Rfl:  .  montelukast (SINGULAIR) 10 MG tablet, , Disp: , Rfl:  .  Multiple Vitamins-Minerals (HM MULTIVITAMIN ADULT GUMMY) CHEW, Chew 2 tablets by mouth daily. , Disp: ,  Rfl:  .  oxyCODONE-acetaminophen (PERCOCET) 10-325 MG tablet, TK 1 T PO Q 6 H PRN, Disp: 120 tablet, Rfl: 0 .  sertraline (ZOLOFT) 50 MG tablet, Take 1 tablet (50 mg total) by mouth daily., Disp: 90 tablet, Rfl: 1 .  tiZANidine (ZANAFLEX) 2 MG tablet, Take 2 mg by mouth every 8 (eight) hours as needed for muscle spasms. , Disp: , Rfl: 1 .  zinc sulfate 220 (50 Zn) MG capsule, Take 1 capsule (220 mg total) by mouth daily., Disp: 30 capsule, Rfl: 0  Allergies  Allergen Reactions  . Aspirin Swelling  . Hydrocodone Itching    Objective:   There were no vitals taken for this visit.  Patient is well-developed, well-nourished in no acute distress.  Resting comfortably at home.  Head is normocephalic, atraumatic.  No labored breathing.  Speech is clear and coherent with logical content.  Patient is alert and oriented at baseline.   Assessment and Plan:   1. RUQ pain Unclear etiology bit concern for gallbladder inflammation/stone giving he is very familiar with different pains and this is something totally new for him. Recommend labs today to assess. He states he cannot get these done today, earliest would be Thursday. Orders placed. Recommend bland diet and good hydration. Discussed if not letting up or anything worsening he needs ER evaluation for labs and imaging. He agrees to this.   2. Chronic back pain greater than 3 months duration Will continue Oxycodone at current dose for now. Referral to pain management placed. Will increase Gabapentin to 900 mg, 900 mg, and 1200 mg. Flexeril refilled. Further adjustments to come from pain specialist once established.     Leeanne Rio, PA-C 02/06/2020

## 2020-02-06 NOTE — ED Triage Notes (Signed)
Right upper quadrant pain x 1 week. Pain is worse x 2 days.

## 2020-02-06 NOTE — Patient Instructions (Signed)
Instructions sent to MyChart

## 2020-02-07 ENCOUNTER — Telehealth: Payer: Self-pay | Admitting: Physician Assistant

## 2020-02-07 ENCOUNTER — Encounter: Payer: Self-pay | Admitting: Physical Medicine and Rehabilitation

## 2020-02-07 NOTE — Telephone Encounter (Signed)
Does not need more blood work at present.  Need to make sure he gets scheduled with the surgeon ASAP.  ER for any worsening symptoms.  If he is unable to see the specialist within a week, I want him to have a ER follow-up with me.

## 2020-02-07 NOTE — Telephone Encounter (Signed)
Pt is aware and understands that if it gets worse that he will go to the ER.

## 2020-02-07 NOTE — Telephone Encounter (Signed)
Pt called in stating that he went to the ER yesterday and they took blood and did imaging. He states that it showed gallstones. He wanted to know if [name] thinks he needs to come in for more bw? He was given a # to a Psychologist, sport and exercise at the hospital to call and set up an appt.   Pt can be reached at the home #

## 2020-02-08 LAB — URINE CULTURE: Culture: NO GROWTH

## 2020-02-09 ENCOUNTER — Emergency Department (HOSPITAL_COMMUNITY)
Admission: EM | Admit: 2020-02-09 | Discharge: 2020-02-09 | Disposition: A | Discharge status: Home / Self Care | Payer: Medicare HMO | Attending: Emergency Medicine | Admitting: Emergency Medicine

## 2020-02-09 ENCOUNTER — Encounter (HOSPITAL_COMMUNITY): Payer: Self-pay | Admitting: *Deleted

## 2020-02-09 ENCOUNTER — Telehealth: Payer: Self-pay

## 2020-02-09 ENCOUNTER — Other Ambulatory Visit: Payer: Self-pay

## 2020-02-09 ENCOUNTER — Other Ambulatory Visit: Payer: Self-pay | Admitting: Physician Assistant

## 2020-02-09 DIAGNOSIS — M549 Dorsalgia, unspecified: Secondary | ICD-10-CM

## 2020-02-09 DIAGNOSIS — Z5321 Procedure and treatment not carried out due to patient leaving prior to being seen by health care provider: Secondary | ICD-10-CM | POA: Diagnosis not present

## 2020-02-09 DIAGNOSIS — F32A Depression, unspecified: Secondary | ICD-10-CM

## 2020-02-09 DIAGNOSIS — R1011 Right upper quadrant pain: Secondary | ICD-10-CM | POA: Diagnosis not present

## 2020-02-09 DIAGNOSIS — G8929 Other chronic pain: Secondary | ICD-10-CM

## 2020-02-09 DIAGNOSIS — F329 Major depressive disorder, single episode, unspecified: Secondary | ICD-10-CM

## 2020-02-09 DIAGNOSIS — M5010 Cervical disc disorder with radiculopathy, unspecified cervical region: Secondary | ICD-10-CM

## 2020-02-09 DIAGNOSIS — F419 Anxiety disorder, unspecified: Secondary | ICD-10-CM

## 2020-02-09 LAB — COMPREHENSIVE METABOLIC PANEL
ALT: 45 U/L — ABNORMAL HIGH (ref 0–44)
AST: 41 U/L (ref 15–41)
Albumin: 4.2 g/dL (ref 3.5–5.0)
Alkaline Phosphatase: 68 U/L (ref 38–126)
Anion gap: 13 (ref 5–15)
BUN: 11 mg/dL (ref 6–20)
CO2: 25 mmol/L (ref 22–32)
Calcium: 9.2 mg/dL (ref 8.9–10.3)
Chloride: 101 mmol/L (ref 98–111)
Creatinine, Ser: 1.08 mg/dL (ref 0.61–1.24)
GFR calc Af Amer: >60 mL/min (ref >60)
GFR calc non Af Amer: >60 mL/min (ref >60)
Glucose, Bld: 112 mg/dL — ABNORMAL HIGH (ref 70–99)
Potassium: 3.7 mmol/L (ref 3.5–5.1)
Sodium: 139 mmol/L (ref 135–145)
Total Bilirubin: 0.7 mg/dL (ref 0.3–1.2)
Total Protein: 7.1 g/dL (ref 6.5–8.1)

## 2020-02-09 LAB — CBC
HCT: 45.9 % (ref 39.0–52.0)
Hemoglobin: 15.5 g/dL (ref 13.0–17.0)
MCH: 33.8 pg (ref 26.0–34.0)
MCHC: 33.8 g/dL (ref 30.0–36.0)
MCV: 100 fL (ref 80.0–100.0)
Platelets: 209 10*3/uL (ref 150–400)
RBC: 4.59 MIL/uL (ref 4.22–5.81)
RDW: 12.6 % (ref 11.5–15.5)
WBC: 8.2 10*3/uL (ref 4.0–10.5)
nRBC: 0 % (ref 0.0–0.2)

## 2020-02-09 LAB — LIPASE, BLOOD: Lipase: 25 U/L (ref 11–51)

## 2020-02-09 MED ORDER — ALPRAZOLAM 0.25 MG PO TABS: 0.2500 mg | ORAL_TABLET | Freq: Two times a day (BID) | ORAL | 0 refills | Status: DC | PRN

## 2020-02-09 MED ORDER — SODIUM CHLORIDE 0.9% FLUSH: 3.0000 mL | Freq: Once | INTRAVENOUS | Status: DC

## 2020-02-09 MED ORDER — OXYCODONE-ACETAMINOPHEN 10-325 MG PO TABS: ORAL_TABLET | ORAL | 0 refills | Status: DC

## 2020-02-09 NOTE — Telephone Encounter (Signed)
Pt called in to get refill on Alprazolam and Oxycodone  Alprazolam last filled 3/8 #30 no refills Oxycodone last filled 3/8 #120 no refills Patient last seen on 02/06/20 Send to [facility] in [place]. Patient also states he is on the way to the ER after he picks up his medication. He states he is in extreme pain and can barely sit down.

## 2020-02-09 NOTE — ED Triage Notes (Signed)
To ED for eval of RUQ pain. Dx with Gallstones on Monday at [facility]. Pt states he can't wait a week to see a surgeon (in [place]) due to pain so severe. Pt denies vomiting. Appears in nad.

## 2020-02-09 NOTE — Telephone Encounter (Signed)
See other telephone note. Gay Filler spoke with patient pharmacy and they verified that they did not receive the rx refills for the Xanax and Oxycodone. Please advise

## 2020-02-09 NOTE — ED Notes (Signed)
Pt stated wait was too long and left.

## 2020-02-09 NOTE — Telephone Encounter (Signed)
Refills have been resubmitted.

## 2020-02-09 NOTE — Telephone Encounter (Signed)
John, pharmacist with [facility] in [place] called regarding the patient's prescriptions for Xanax and Oxycodone. He states they never received the prescriptions.

## 2020-02-09 NOTE — Telephone Encounter (Signed)
See other note

## 2020-02-15 ENCOUNTER — Emergency Department (HOSPITAL_COMMUNITY): Payer: Medicare HMO

## 2020-02-15 ENCOUNTER — Encounter (HOSPITAL_COMMUNITY): Payer: Self-pay

## 2020-02-15 ENCOUNTER — Inpatient Hospital Stay (HOSPITAL_COMMUNITY)
Admission: EM | Admit: 2020-02-15 | Discharge: 2020-02-28 | DRG: 493 | Disposition: A | Discharge status: Home Health | Payer: Medicare HMO | Attending: Family Medicine | Admitting: Family Medicine

## 2020-02-15 ENCOUNTER — Other Ambulatory Visit: Payer: Self-pay

## 2020-02-15 DIAGNOSIS — Z8249 Family history of ischemic heart disease and other diseases of the circulatory system: Secondary | ICD-10-CM

## 2020-02-15 DIAGNOSIS — S3992XA Unspecified injury of lower back, initial encounter: Secondary | ICD-10-CM | POA: Diagnosis not present

## 2020-02-15 DIAGNOSIS — S9305XA Dislocation of left ankle joint, initial encounter: Secondary | ICD-10-CM | POA: Diagnosis not present

## 2020-02-15 DIAGNOSIS — R52 Pain, unspecified: Secondary | ICD-10-CM | POA: Diagnosis not present

## 2020-02-15 DIAGNOSIS — S82892A Other fracture of left lower leg, initial encounter for closed fracture: Secondary | ICD-10-CM | POA: Diagnosis present

## 2020-02-15 DIAGNOSIS — W19XXXA Unspecified fall, initial encounter: Secondary | ICD-10-CM

## 2020-02-15 DIAGNOSIS — D7589 Other specified diseases of blood and blood-forming organs: Secondary | ICD-10-CM | POA: Diagnosis present

## 2020-02-15 DIAGNOSIS — R1011 Right upper quadrant pain: Secondary | ICD-10-CM

## 2020-02-15 DIAGNOSIS — M25531 Pain in right wrist: Secondary | ICD-10-CM | POA: Diagnosis not present

## 2020-02-15 DIAGNOSIS — S82892D Other fracture of left lower leg, subsequent encounter for closed fracture with routine healing: Secondary | ICD-10-CM | POA: Diagnosis not present

## 2020-02-15 DIAGNOSIS — R109 Unspecified abdominal pain: Secondary | ICD-10-CM | POA: Diagnosis not present

## 2020-02-15 DIAGNOSIS — S0990XA Unspecified injury of head, initial encounter: Secondary | ICD-10-CM | POA: Diagnosis not present

## 2020-02-15 DIAGNOSIS — I10 Essential (primary) hypertension: Secondary | ICD-10-CM | POA: Diagnosis present

## 2020-02-15 DIAGNOSIS — Z885 Allergy status to narcotic agent status: Secondary | ICD-10-CM | POA: Diagnosis not present

## 2020-02-15 DIAGNOSIS — Z79891 Long term (current) use of opiate analgesic: Secondary | ICD-10-CM | POA: Diagnosis not present

## 2020-02-15 DIAGNOSIS — S93432A Sprain of tibiofibular ligament of left ankle, initial encounter: Secondary | ICD-10-CM | POA: Diagnosis present

## 2020-02-15 DIAGNOSIS — S82842A Displaced bimalleolar fracture of left lower leg, initial encounter for closed fracture: Secondary | ICD-10-CM | POA: Diagnosis not present

## 2020-02-15 DIAGNOSIS — Z886 Allergy status to analgesic agent status: Secondary | ICD-10-CM | POA: Diagnosis not present

## 2020-02-15 DIAGNOSIS — Z8781 Personal history of (healed) traumatic fracture: Secondary | ICD-10-CM

## 2020-02-15 DIAGNOSIS — S82852B Displaced trimalleolar fracture of left lower leg, initial encounter for open fracture type I or II: Secondary | ICD-10-CM | POA: Diagnosis present

## 2020-02-15 DIAGNOSIS — Z03818 Encounter for observation for suspected exposure to other biological agents ruled out: Secondary | ICD-10-CM | POA: Diagnosis not present

## 2020-02-15 DIAGNOSIS — R748 Abnormal levels of other serum enzymes: Secondary | ICD-10-CM | POA: Diagnosis not present

## 2020-02-15 DIAGNOSIS — Z79899 Other long term (current) drug therapy: Secondary | ICD-10-CM

## 2020-02-15 DIAGNOSIS — Z6839 Body mass index (BMI) 39.0-39.9, adult: Secondary | ICD-10-CM | POA: Diagnosis not present

## 2020-02-15 DIAGNOSIS — N179 Acute kidney failure, unspecified: Secondary | ICD-10-CM | POA: Diagnosis not present

## 2020-02-15 DIAGNOSIS — M47816 Spondylosis without myelopathy or radiculopathy, lumbar region: Secondary | ICD-10-CM | POA: Diagnosis not present

## 2020-02-15 DIAGNOSIS — Z20822 Contact with and (suspected) exposure to covid-19: Secondary | ICD-10-CM | POA: Diagnosis present

## 2020-02-15 DIAGNOSIS — Z825 Family history of asthma and other chronic lower respiratory diseases: Secondary | ICD-10-CM

## 2020-02-15 DIAGNOSIS — G894 Chronic pain syndrome: Secondary | ICD-10-CM | POA: Diagnosis present

## 2020-02-15 DIAGNOSIS — W01198A Fall on same level from slipping, tripping and stumbling with subsequent striking against other object, initial encounter: Secondary | ICD-10-CM | POA: Diagnosis present

## 2020-02-15 DIAGNOSIS — K8020 Calculus of gallbladder without cholecystitis without obstruction: Secondary | ICD-10-CM | POA: Diagnosis present

## 2020-02-15 DIAGNOSIS — T879 Unspecified complications of amputation stump: Secondary | ICD-10-CM | POA: Diagnosis not present

## 2020-02-15 DIAGNOSIS — M549 Dorsalgia, unspecified: Secondary | ICD-10-CM | POA: Diagnosis not present

## 2020-02-15 DIAGNOSIS — R000 Tachycardia, unspecified: Secondary | ICD-10-CM | POA: Diagnosis not present

## 2020-02-15 DIAGNOSIS — R55 Syncope and collapse: Secondary | ICD-10-CM | POA: Diagnosis present

## 2020-02-15 DIAGNOSIS — K760 Fatty (change of) liver, not elsewhere classified: Secondary | ICD-10-CM | POA: Diagnosis not present

## 2020-02-15 DIAGNOSIS — Z7689 Persons encountering health services in other specified circumstances: Secondary | ICD-10-CM | POA: Diagnosis not present

## 2020-02-15 DIAGNOSIS — Y906 Blood alcohol level of 120-199 mg/100 ml: Secondary | ICD-10-CM | POA: Diagnosis present

## 2020-02-15 DIAGNOSIS — Y92009 Unspecified place in unspecified non-institutional (private) residence as the place of occurrence of the external cause: Secondary | ICD-10-CM | POA: Diagnosis not present

## 2020-02-15 DIAGNOSIS — R0902 Hypoxemia: Secondary | ICD-10-CM | POA: Diagnosis not present

## 2020-02-15 DIAGNOSIS — S82852A Displaced trimalleolar fracture of left lower leg, initial encounter for closed fracture: Secondary | ICD-10-CM | POA: Diagnosis not present

## 2020-02-15 DIAGNOSIS — F1010 Alcohol abuse, uncomplicated: Secondary | ICD-10-CM | POA: Diagnosis present

## 2020-02-15 DIAGNOSIS — M545 Low back pain: Secondary | ICD-10-CM | POA: Diagnosis not present

## 2020-02-15 DIAGNOSIS — S199XXA Unspecified injury of neck, initial encounter: Secondary | ICD-10-CM | POA: Diagnosis not present

## 2020-02-15 DIAGNOSIS — Z9682 Presence of neurostimulator: Secondary | ICD-10-CM

## 2020-02-15 DIAGNOSIS — G8918 Other acute postprocedural pain: Secondary | ICD-10-CM | POA: Diagnosis not present

## 2020-02-15 DIAGNOSIS — E669 Obesity, unspecified: Secondary | ICD-10-CM | POA: Diagnosis present

## 2020-02-15 DIAGNOSIS — S82832A Other fracture of upper and lower end of left fibula, initial encounter for closed fracture: Secondary | ICD-10-CM | POA: Diagnosis not present

## 2020-02-15 DIAGNOSIS — W109XXA Fall (on) (from) unspecified stairs and steps, initial encounter: Secondary | ICD-10-CM | POA: Diagnosis not present

## 2020-02-15 DIAGNOSIS — M25532 Pain in left wrist: Secondary | ICD-10-CM | POA: Diagnosis present

## 2020-02-15 DIAGNOSIS — J45909 Unspecified asthma, uncomplicated: Secondary | ICD-10-CM | POA: Diagnosis present

## 2020-02-15 DIAGNOSIS — M25572 Pain in left ankle and joints of left foot: Secondary | ICD-10-CM | POA: Diagnosis present

## 2020-02-15 LAB — CBC WITH DIFFERENTIAL/PLATELET
Abs Immature Granulocytes: 0.02 10*3/uL (ref 0.00–0.07)
Basophils Absolute: 0 10*3/uL (ref 0.0–0.1)
Basophils Relative: 1 %
Eosinophils Absolute: 0.1 10*3/uL (ref 0.0–0.5)
Eosinophils Relative: 1 %
HCT: 46.4 % (ref 39.0–52.0)
Hemoglobin: 16.1 g/dL (ref 13.0–17.0)
Immature Granulocytes: 0 %
Lymphocytes Relative: 34 %
Lymphs Abs: 1.8 10*3/uL (ref 0.7–4.0)
MCH: 34.2 pg — ABNORMAL HIGH (ref 26.0–34.0)
MCHC: 34.7 g/dL (ref 30.0–36.0)
MCV: 98.5 fL (ref 80.0–100.0)
Monocytes Absolute: 0.5 10*3/uL (ref 0.1–1.0)
Monocytes Relative: 10 %
Neutro Abs: 2.9 10*3/uL (ref 1.7–7.7)
Neutrophils Relative %: 54 %
Platelets: 183 10*3/uL (ref 150–400)
RBC: 4.71 MIL/uL (ref 4.22–5.81)
RDW: 12.4 % (ref 11.5–15.5)
WBC: 5.4 10*3/uL (ref 4.0–10.5)
nRBC: 0 % (ref 0.0–0.2)

## 2020-02-15 LAB — COMPREHENSIVE METABOLIC PANEL
ALT: 61 U/L — ABNORMAL HIGH (ref 0–44)
AST: 72 U/L — ABNORMAL HIGH (ref 15–41)
Albumin: 3.9 g/dL (ref 3.5–5.0)
Alkaline Phosphatase: 74 U/L (ref 38–126)
Anion gap: 16 — ABNORMAL HIGH (ref 5–15)
BUN: 10 mg/dL (ref 6–20)
CO2: 19 mmol/L — ABNORMAL LOW (ref 22–32)
Calcium: 8.8 mg/dL — ABNORMAL LOW (ref 8.9–10.3)
Chloride: 104 mmol/L (ref 98–111)
Creatinine, Ser: 0.95 mg/dL (ref 0.61–1.24)
GFR calc Af Amer: >60 mL/min (ref >60)
GFR calc non Af Amer: >60 mL/min (ref >60)
Glucose, Bld: 104 mg/dL — ABNORMAL HIGH (ref 70–99)
Potassium: 4.1 mmol/L (ref 3.5–5.1)
Sodium: 139 mmol/L (ref 135–145)
Total Bilirubin: 0.8 mg/dL (ref 0.3–1.2)
Total Protein: 7 g/dL (ref 6.5–8.1)

## 2020-02-15 LAB — ETHANOL: Alcohol, Ethyl (B): 193 mg/dL — ABNORMAL HIGH (ref <10)

## 2020-02-15 MED ORDER — OXYCODONE-ACETAMINOPHEN 5-325 MG PO TABS
2.0000 | ORAL_TABLET | Freq: Once | ORAL | Status: AC
  Administered 2020-02-15: 20:00:00 2 via ORAL
  Filled 2020-02-15: qty 2

## 2020-02-15 MED ORDER — PROPOFOL 10 MG/ML IV BOLUS: 0.5000 mg/kg | Freq: Once | INTRAVENOUS | Status: DC

## 2020-02-15 MED ORDER — ATORVASTATIN CALCIUM 10 MG PO TABS
10.0000 mg | ORAL_TABLET | Freq: Every day | ORAL | Status: DC
  Administered 2020-02-18 – 2020-02-27 (×11): 10 mg via ORAL
  Filled 2020-02-15 (×10): qty 1

## 2020-02-15 MED ORDER — HYDROMORPHONE HCL 1 MG/ML IJ SOLN
2.0000 mg | Freq: Once | INTRAMUSCULAR | Status: AC
  Administered 2020-02-15: 2 mg via INTRAVENOUS
  Filled 2020-02-15: qty 2

## 2020-02-15 MED ORDER — MONTELUKAST SODIUM 10 MG PO TABS
10.0000 mg | ORAL_TABLET | Freq: Every day | ORAL | Status: DC
  Administered 2020-02-16 – 2020-02-27 (×13): 10 mg via ORAL
  Filled 2020-02-15 (×13): qty 1

## 2020-02-15 MED ORDER — ONDANSETRON HCL 4 MG/2ML IJ SOLN
4.0000 mg | Freq: Four times a day (QID) | INTRAMUSCULAR | Status: DC | PRN
  Administered 2020-02-16: 4 mg via INTRAVENOUS
  Filled 2020-02-15: qty 2

## 2020-02-15 MED ORDER — GABAPENTIN 300 MG PO CAPS
900.0000 mg | ORAL_CAPSULE | Freq: Two times a day (BID) | ORAL | Status: DC
  Administered 2020-02-16 – 2020-02-28 (×25): 900 mg via ORAL
  Filled 2020-02-15 (×25): qty 3

## 2020-02-15 MED ORDER — HYDROMORPHONE HCL 1 MG/ML IJ SOLN
1.0000 mg | Freq: Once | INTRAMUSCULAR | Status: AC
  Administered 2020-02-15: 21:00:00 1 mg via INTRAVENOUS
  Filled 2020-02-15: qty 1

## 2020-02-15 MED ORDER — LISINOPRIL 20 MG PO TABS
20.0000 mg | ORAL_TABLET | Freq: Every day | ORAL | Status: DC
  Administered 2020-02-16 – 2020-02-22 (×7): 20 mg via ORAL
  Filled 2020-02-15 (×7): qty 1

## 2020-02-15 MED ORDER — OXYCODONE HCL 5 MG PO TABS
10.0000 mg | ORAL_TABLET | Freq: Four times a day (QID) | ORAL | Status: DC | PRN
  Administered 2020-02-16 – 2020-02-18 (×9): 10 mg via ORAL
  Filled 2020-02-15 (×10): qty 2

## 2020-02-15 MED ORDER — GABAPENTIN 600 MG PO TABS: 600.0000 mg | ORAL_TABLET | Freq: Every day | ORAL | Status: DC

## 2020-02-15 MED ORDER — PROPOFOL 10 MG/ML IV BOLUS
INTRAVENOUS | Status: AC
  Filled 2020-02-15: qty 40

## 2020-02-15 MED ORDER — ENOXAPARIN SODIUM 60 MG/0.6ML ~~LOC~~ SOLN
55.0000 mg | SUBCUTANEOUS | Status: DC
  Administered 2020-02-16 – 2020-02-22 (×7): 55 mg via SUBCUTANEOUS
  Filled 2020-02-15 (×10): qty 0.55

## 2020-02-15 MED ORDER — HYDROMORPHONE HCL 1 MG/ML IJ SOLN
1.0000 mg | Freq: Once | INTRAMUSCULAR | Status: AC
  Administered 2020-02-15: 1 mg via INTRAVENOUS
  Filled 2020-02-15 (×2): qty 1

## 2020-02-15 MED ORDER — ALPRAZOLAM 0.25 MG PO TABS
0.2500 mg | ORAL_TABLET | Freq: Every day | ORAL | Status: DC | PRN
  Administered 2020-02-16: 01:00:00 0.25 mg via ORAL
  Filled 2020-02-15: qty 1

## 2020-02-15 MED ORDER — CYCLOBENZAPRINE HCL 10 MG PO TABS
10.0000 mg | ORAL_TABLET | Freq: Every day | ORAL | Status: DC | PRN
  Administered 2020-02-16 – 2020-02-27 (×9): 10 mg via ORAL
  Filled 2020-02-15 (×10): qty 1

## 2020-02-15 MED ORDER — PROPOFOL 10 MG/ML IV BOLUS
INTRAVENOUS | Status: AC | PRN
  Administered 2020-02-15 (×2): 50 mg via INTRAVENOUS

## 2020-02-15 MED ORDER — ONDANSETRON HCL 4 MG PO TABS
4.0000 mg | ORAL_TABLET | Freq: Four times a day (QID) | ORAL | Status: DC | PRN
  Filled 2020-02-15 (×2): qty 1

## 2020-02-15 MED ORDER — HYDROMORPHONE HCL 1 MG/ML IJ SOLN
1.0000 mg | Freq: Once | INTRAMUSCULAR | Status: AC
  Administered 2020-02-15: 19:00:00 1 mg via INTRAVENOUS
  Filled 2020-02-15: qty 1

## 2020-02-15 MED ORDER — SERTRALINE HCL 50 MG PO TABS
50.0000 mg | ORAL_TABLET | Freq: Every day | ORAL | Status: DC
  Administered 2020-02-16 – 2020-02-28 (×12): 50 mg via ORAL
  Filled 2020-02-15 (×13): qty 1

## 2020-02-15 NOTE — ED Notes (Signed)
Pt transported to CT ?

## 2020-02-15 NOTE — H&P (Signed)
History and Physical    Daniel Durham [id number] DOB: [birth year] DOA: 02/15/2020  PCP: Brunetta Jeans, PA-C  Patient coming from: Home.  Chief Complaint: Fall.  HPI: Daniel Durham is a 54 y.o. male with history of hypertension chronic back pain asthma was brought to the ER after patient had a fall.  Patient states he slipped and fell today at his head and lost consciousness briefly.  He hurt his left ankle and was brought to the ER.  Denies any chest pain or shortness of breath.  Has been worked up for his gallstones and has follow-up with surgery next week.  He drinks alcohol every day.  ED Course: In the ER patient had x-ray showed left ankle fracture for which Dr. Stann Mainland orthopedic surgeon was consulted and requested follow-up with orthopedics in a week for further management since patient's swelling is to decrease before surgery.  This patient has significant pain and has left upper forearm amputation can find it difficult to walk with crutches and will need further help.  Patient has significant pain and difficulty ambulate admitted for further observation.  Labs are largely unremarkable except for most some macrocytosis.  Covid test was negative.  CT head C-spine and L-spine were unremarkable.  Review of Systems: As per HPI, rest all negative.   Past Medical History:  Diagnosis Date  . Allergy   . Anxiety   . Asthma    SEASONAL   . Blood transfusion without reported diagnosis   . Chronic back pain    Neurostimulator  . Depression   . Environmental allergies   . Hypertension   . Neuromuscular disorder ([place])   . Neuropathy   . Seasonal allergic conjunctivitis     Past Surgical History:  Procedure Laterality Date  . BACK SURGERY    . CARDIAC CATHETERIZATION     11/05/10 [facility], [place]): [place] of mLAD, no sign disease. EF 45% in RAO, 60% LAO (51% stress, 54% rest by NM stress; 57-59% echo 10/2010)  . CARPAL TUNNEL RELEASE     Bilateral  . CERVICAL  DISCECTOMY  12/17/2017   titanium plates 12-17-17  . COLONOSCOPY    . HAND AMPUTATION  2007  . POLYPECTOMY     pt states had colon in [place] and he had polyps- he has no idea what type   . ROTATOR CUFF REPAIR     Left  . SPINAL CORD STIMULATOR IMPLANT    . SPINAL CORD STIMULATOR REMOVAL N/A 10/26/2015   Procedure: LUMBAR SPINAL CORD STIMULATOR REMOVAL;  Surgeon: Clydell Hakim, MD;  Location: [facility];  Service: Neurosurgery;  Laterality: N/A;     reports that he has never smoked. He has never used smokeless tobacco. He reports current alcohol use. He reports that he does not use drugs.  Allergies  Allergen Reactions  . Aspirin Swelling  . Hydrocodone Itching    Family History  Problem Relation Age of Onset  . Diabetes Mother        Living  . Sleep apnea Mother   . Diabetes Father 78       Deceased  . Hypertension Father   . Jaundice Father   . Hemophilia Father   . Alcoholism Father   . Cancer Maternal Grandfather        Throat  . Esophageal cancer Maternal Grandfather   . Cancer Paternal Uncle        Throat  . Esophageal cancer Paternal Uncle   . Asthma Sister   .  Diabetes Sister   . Healthy Son        X1  . Healthy Daughter        X1  . Colon polyps Neg Hx   . Colon cancer Neg Hx   . Rectal cancer Neg Hx   . Stomach cancer Neg Hx     Prior to Admission medications   Medication Sig Start Date End Date Taking? Authorizing Provider  ALPRAZolam (XANAX) 0.25 MG tablet Take 1 tablet (0.25 mg total) by mouth 2 (two) times daily as needed for anxiety. Patient taking differently: Take 0.25 mg by mouth daily as needed for anxiety.  02/09/20  Yes Brunetta Jeans, PA-C  atorvastatin (LIPITOR) 10 MG tablet TAKE 1 TABLET(10 MG) BY MOUTH DAILY Patient taking differently: Take 10 mg by mouth daily.  09/23/19  Yes Brunetta Jeans, PA-C  cyclobenzaprine (FLEXERIL) 10 MG tablet Take 1 tablet (10 mg total) by mouth at bedtime. Patient taking differently: Take 10 mg by  mouth daily as needed for muscle spasms.  02/06/20  Yes Brunetta Jeans, PA-C  gabapentin (NEURONTIN) 300 MG capsule Take 1 capsule each morning and midday along with your 600 mg dose to make a total of 900 mg Patient taking differently: Take 900 mg by mouth in the morning and at bedtime.  02/06/20  Yes Brunetta Jeans, PA-C  gabapentin (NEURONTIN) 600 MG tablet Take 1 tablet (600 mg total) by mouth 3 (three) times daily. Patient taking differently: Take 600 mg by mouth daily. In the afternoon. 02/06/20  Yes Brunetta Jeans, PA-C  ipratropium-albuterol (DUONEB) 0.5-2.5 (3) MG/3ML SOLN Take 3 mLs by nebulization 3 (three) times daily. 11/21/19  Yes Vann, Jessica U, DO  lisinopril (ZESTRIL) 20 MG tablet TAKE 1 TABLET(20 MG) BY MOUTH DAILY Patient taking differently: Take 20 mg by mouth daily.  05/27/19  Yes Brunetta Jeans, PA-C  montelukast (SINGULAIR) 10 MG tablet Take 10 mg by mouth daily.  01/06/20  Yes [provider]  oxyCODONE-acetaminophen (PERCOCET) 10-325 MG tablet TK 1 T PO Q 6 H PRN Patient taking differently: Take 1 tablet by mouth every 6 (six) hours as needed.  02/09/20  Yes Brunetta Jeans, PA-C  sertraline (ZOLOFT) 50 MG tablet Take 1 tablet (50 mg total) by mouth daily. 10/17/19  Yes Brunetta Jeans, PA-C  tiZANidine (ZANAFLEX) 2 MG tablet Take 2 mg by mouth daily as needed for muscle spasms.    Yes [provider]  docusate sodium (COLACE) 100 MG capsule Take 1 capsule (100 mg total) by mouth 2 (two) times daily. Patient not taking: Reported on 02/15/2020 11/23/19   Candee Furbish, MD  zinc sulfate 220 (50 Zn) MG capsule Take 1 capsule (220 mg total) by mouth daily. Patient not taking: Reported on 02/15/2020 11/22/19   Radene Gunning, NP    Physical Exam: Constitutional: Moderately built and nourished. Vitals:   02/15/20 1747 02/15/20 1751 02/15/20 1755 02/15/20 1830  BP: 132/65 (!) 150/98 (!) 154/87 (!) 125/92  Pulse: 89 90 85 76  Resp: 20 18 15  (!) 22    Temp:  98.7 F (37.1 C)    TempSrc:  Oral    SpO2:  96% 96% 95%  Weight:      Height:       Eyes: Anicteric no pallor. ENMT: No discharge from the ears eyes nose or mouth. Neck: No mass felt.  No neck rigidity. Respiratory: No rhonchi or crepitations. Cardiovascular: S1-S2 heard. Abdomen: Soft nontender bowel  sounds present. Musculoskeletal: Patient's left leg is in a brace. Skin: Chronic skin changes. Neurologic: Alert awake oriented to time place and person.  Moves all extremities. Psychiatric: Appears normal per normal affect.   Labs on Admission: I have personally reviewed following labs and imaging studies  CBC: Recent Labs  Lab 02/09/20 1535 02/15/20 1654  WBC 8.2 5.4  NEUTROABS  --  2.9  HGB 15.5 16.1  HCT 45.9 46.4  MCV 100.0 98.5  PLT 209 [id number]   Basic Metabolic Panel: Recent Labs  Lab 02/09/20 1535 02/15/20 1654  NA 139 139  K 3.7 4.1  CL 101 104  CO2 25 19*  GLUCOSE 112* 104*  BUN 11 10  CREATININE 1.08 0.95  CALCIUM 9.2 8.8*   GFR: Estimated Creatinine Clearance: 109.4 mL/min (by C-G formula based on SCr of 0.95 mg/dL). Liver Function Tests: Recent Labs  Lab 02/09/20 1535 02/15/20 1654  AST 41 72*  ALT 45* 61*  ALKPHOS 68 74  BILITOT 0.7 0.8  PROT 7.1 7.0  ALBUMIN 4.2 3.9   Recent Labs  Lab 02/09/20 1535  LIPASE 25   No results for input(s): AMMONIA in the last 168 hours. Coagulation Profile: No results for input(s): INR, PROTIME in the last 168 hours. Cardiac Enzymes: No results for input(s): CKTOTAL, CKMB, CKMBINDEX, TROPONINI in the last 168 hours. BNP (last 3 results) No results for input(s): PROBNP in the last 8760 hours. HbA1C: No results for input(s): HGBA1C in the last 72 hours. CBG: No results for input(s): GLUCAP in the last 168 hours. Lipid Profile: No results for input(s): CHOL, HDL, LDLCALC, TRIG, CHOLHDL, LDLDIRECT in the last 72 hours. Thyroid Function Tests: No results for input(s): TSH, T4TOTAL, FREET4,  T3FREE, THYROIDAB in the last 72 hours. Anemia Panel: No results for input(s): VITAMINB12, FOLATE, FERRITIN, TIBC, IRON, RETICCTPCT in the last 72 hours. Urine analysis:    Component Value Date/Time   COLORURINE YELLOW 02/06/2020 2039   APPEARANCEUR CLEAR 02/06/2020 2039   LABSPEC 1.015 02/06/2020 2039   PHURINE 6.0 02/06/2020 2039   GLUCOSEU NEGATIVE 02/06/2020 2039   GLUCOSEU NEGATIVE 01/31/2015 0847   HGBUR NEGATIVE 02/06/2020 2039   BILIRUBINUR NEGATIVE 02/06/2020 2039   KETONESUR NEGATIVE 02/06/2020 2039   PROTEINUR NEGATIVE 02/06/2020 2039   UROBILINOGEN 0.2 01/31/2015 0847   NITRITE NEGATIVE 02/06/2020 2039   LEUKOCYTESUR NEGATIVE 02/06/2020 2039   Sepsis Labs: @LABRCNTIP (procalcitonin:4,lacticidven:4) ) Recent Results (from the past 240 hour(s))  Urine culture     Status: None   Collection Time: 02/06/20  8:39 PM   Specimen: Urine, Random  Result Value Ref Range Status   Specimen Description   Final    URINE, RANDOM Performed at [facility], [address] [place], [place] [address]    Special Requests   Final    NONE Performed at [facility], [address] [place], [place] [address]    Culture   Final    NO GROWTH Performed at [facility], [place] [address] [place], [place] [address]    Report Status 02/08/2020 FINAL  Final     Radiological Exams on Admission: CT Head Wo Contrast  Result Date: 02/15/2020 CLINICAL DATA:  Head trauma, slipped on mirror and fell backwards EXAM: CT HEAD WITHOUT CONTRAST TECHNIQUE: Contiguous axial images were obtained from the base of the skull through the vertex without intravenous contrast. COMPARISON:  None. FINDINGS: Brain: No evidence of acute territorial infarction, hemorrhage, hydrocephalus,extra-axial collection or mass lesion/mass effect.  Normal gray-white differentiation. Ventricles are normal in size and contour. Vascular: No hyperdense vessel or unexpected calcification. Skull: The skull  is intact. No fracture or focal lesion identified. Sinuses/Orbits: The visualized paranasal sinuses and mastoid air cells are clear. The orbits and globes intact. Other: None Cervical spine: Alignment: Physiologic Skull base and vertebrae: Visualized skull base is intact. No atlanto-occipital dissociation. The vertebral body heights are well maintained. The patient is status post ACDF at C6-C7. There is solid interbody fusion seen across the disc space. No periprosthetic lucency or hardware fracture seen. No fracture or pathologic osseous lesion seen. Soft tissues and spinal canal: The visualized paraspinal soft tissues are unremarkable. No prevertebral soft tissue swelling is seen. The spinal canal is grossly unremarkable, no large epidural collection or significant canal narrowing. Disc levels: No significant canal or neural foraminal narrowing is noted. Upper chest: Mild biapical ground-glass opacity, likely atelectasis. Thoracic inlet is within normal limits. Other: None IMPRESSION: 1.  No acute intracranial abnormality. 2.  No acute fracture or malalignment of the spine. 3. Status post ACDF at C6-C7 without hardware complication. Electronically Signed   By: Prudencio Pair M.D.   On: 02/15/2020 19:29   CT Cervical Spine Wo Contrast  Result Date: 02/15/2020 CLINICAL DATA:  Head trauma, slipped on mirror and fell backwards EXAM: CT HEAD WITHOUT CONTRAST TECHNIQUE: Contiguous axial images were obtained from the base of the skull through the vertex without intravenous contrast. COMPARISON:  None. FINDINGS: Brain: No evidence of acute territorial infarction, hemorrhage, hydrocephalus,extra-axial collection or mass lesion/mass effect. Normal gray-white differentiation. Ventricles are normal in size and contour. Vascular: No hyperdense vessel or unexpected calcification. Skull: The skull is intact. No fracture or focal lesion identified. Sinuses/Orbits: The visualized paranasal sinuses and mastoid air cells are  clear. The orbits and globes intact. Other: None Cervical spine: Alignment: Physiologic Skull base and vertebrae: Visualized skull base is intact. No atlanto-occipital dissociation. The vertebral body heights are well maintained. The patient is status post ACDF at C6-C7. There is solid interbody fusion seen across the disc space. No periprosthetic lucency or hardware fracture seen. No fracture or pathologic osseous lesion seen. Soft tissues and spinal canal: The visualized paraspinal soft tissues are unremarkable. No prevertebral soft tissue swelling is seen. The spinal canal is grossly unremarkable, no large epidural collection or significant canal narrowing. Disc levels: No significant canal or neural foraminal narrowing is noted. Upper chest: Mild biapical ground-glass opacity, likely atelectasis. Thoracic inlet is within normal limits. Other: None IMPRESSION: 1.  No acute intracranial abnormality. 2.  No acute fracture or malalignment of the spine. 3. Status post ACDF at C6-C7 without hardware complication. Electronically Signed   By: Prudencio Pair M.D.   On: 02/15/2020 19:29   CT Lumbar Spine Wo Contrast  Result Date: 02/15/2020 CLINICAL DATA:  54 year old male status post slip and fall backwards. Low back pain. EXAM: CT LUMBAR SPINE WITHOUT CONTRAST TECHNIQUE: Multidetector CT imaging of the lumbar spine was performed without intravenous contrast administration. Multiplanar CT image reconstructions were also generated. COMPARISON:  Lumbar spine CT 07/30/2015. FINDINGS: Segmentation: Mildly transitional anatomy, same numbering system used as in 2016 designating the lowest open disc space L5-S1, but there are hypoplastic ribs at L1 (more so the right series 5, image 27). Correlation with radiographs is recommended prior to any operative intervention. Alignment: Stable straightening of lumbar lordosis. No spondylolisthesis. Vertebrae: T12 and lumbar vertebrae appear intact. Intact visible sacrum and SI joints.  Paraspinal and other soft tissues: Partially visible fatty pancreatic  atrophy. Otherwise negative visible noncontrast abdominal and pelvic viscera. Paraspinal soft tissues appear within normal limits, a previously-seen thoracic spinal stimulator device has been removed. Disc levels: T11-T12: Moderate right side facet hypertrophy with moderate to severe right T11 foraminal stenosis (series 5, image 1). T12-L1:  Negative. L1-L2:  Negative. L2-L3: Minimal disc bulge and mild facet hypertrophy appears stable since 2016. No convincing stenosis. L3-L4: Mild circumferential disc bulge and facet hypertrophy appears stable since 2016 with up to mild spinal and bilateral L3 foraminal stenosis. L4-L5: Mild circumferential disc bulge and facet hypertrophy. No convincing spinal stenosis. Mild L4 foraminal stenosis appears stable and in part related to chronic endplate spurring. L5-S1: Interval postoperative changes to the left lamina (series 4 image 103). Suspect architectural distortion at the left lateral recess. Mild facet hypertrophy. Residual disc bulge and endplate spurring. No convincing spinal stenosis. Mild bilateral L5 foraminal stenosis appears stable. IMPRESSION: 1. No acute traumatic injury identified in the lumbar spine. 2. Mildly transitional anatomy, same numbering system used on a 2016 lumbar CT. 3. Thoracic spinal stimulator device removed since 2016. And interval postoperative changes to the left lamina at L5-S1. 4. 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 And up to mild L3 through L5 foraminal stenosis appears stable. 5. Moderate to severe right T11 neural foraminal stenosis related to facet degeneration. Electronically Signed   By: Genevie Ann M.D.   On: 02/15/2020 19:36   CT Ankle Left Wo Contrast  Result Date: 02/15/2020 CLINICAL DATA:  Ankle fracture after fall, post reduction EXAM: CT OF THE LEFT ANKLE WITHOUT CONTRAST TECHNIQUE: Multidetector CT imaging of the left ankle was performed  according to the standard protocol. Multiplanar CT image reconstructions were also generated. COMPARISON:  Radiograph same day FINDINGS: Bones/Joint/Cartilage There is tiny osseous chip fractures seen off the medial malleolus. There remains slight widening of the medial malleolus measuring up to 5 mm. Comminuted intra-articular mildly displaced fracture seen of the posterior malleolus. There tiny osseous fragment seen at the anterior tibiotalar joint. A mildly displaced obliquely oriented fracture seen of the distal fibula at the level of the ankle mortise with small surrounding fracture fragments within this seen is most CIS. No other definite fractures are identified. A small os navicular is present. Ligaments Suboptimally assessed by CT. Muscles and Tendons The muscles surrounding the ankle appear to be intact. The flexor and extensor tendons are intact. The Achilles tendon is intact. Soft tissues Diffuse soft tissue swelling is seen around the ankle, predominantly around the medial malleolus. There is a small ankle joint effusion present. Heel pad inflammation is present. IMPRESSION: 1. The patient is status post reduction of the trimalleolar fracture as described above. There remains slight widening of the medial clear space however. 2. Tiny osseous fragment seen at the medial anterior talar dome which could represent a small chip fracture. 3. Small ankle joint effusion and significant surrounding soft tissue edema. Electronically Signed   By: Prudencio Pair M.D.   On: 02/15/2020 19:36   DG Ankle Left Port  Result Date: 02/15/2020 CLINICAL DATA:  Post reduction EXAM: PORTABLE LEFT ANKLE - 2 VIEW COMPARISON:  Radiograph same day FINDINGS: There is been interval reduction of the fracture dislocation of the ankle. The ankle mortise appears to be in near anatomic alignment. There remains a obliquely oriented mildly displaced distal fibular fracture. IMPRESSION: Status post reduction of ankle fracture dislocation  in near anatomic alignment. Electronically Signed   By: Prudencio Pair M.D.   On: 02/15/2020 18:23  DG Ankle Left Port  Result Date: 02/15/2020 CLINICAL DATA:  Recent fall with ankle deformity, initial encounter EXAM: PORTABLE LEFT ANKLE - 2 VIEW COMPARISON:  None. FINDINGS: There is posterolateral dislocation of the talus with respect to the distal tibia. Oblique fracture through the distal fibula is noted. Some regularity in the region of the posterior malleolus is seen likely related to undisplaced fracture. IMPRESSION: Distal fibular fracture with posterolateral dislocation of the talus with respect to the distal tibia. There is likely a posterior malleolar fracture present as well. Electronically Signed   By: Inez Catalina M.D.   On: 02/15/2020 17:28     Assessment/Plan Principal Problem:   Ankle fracture, bimalleolar, closed, left, initial encounter Active Problems:   HTN (hypertension)    1. Left ankle fracture for which patient will need follow-up with Dr. Stann Mainland orthopedic surgeon as outpatient for further management but since patient has significant pain and difficulty ambulating patient admitted for observation we will get physical therapy consult. 2. Hypertension on lisinopril. 3. History of alcohol abuse on CIWA protocol. 4. History of asthma presently not wheezing. 5. Chronic pain on oxycodone Flexeril and gabapentin. 6. History of gallstones for which patient has follow-up with general surgery.   DVT prophylaxis: Lovenox. Code Status: Full code. Family Communication: Patient's wife. Disposition Plan: Home. Consults called: ER physician discussed with Dr. Stann Mainland.  Physical therapy. Admission status: Observation.   Rise Patience MD [facility] Pager [phone].  If 7PM-7AM, please contact night-coverage [email or web address] Password [facility]  02/15/2020, 11:56 PM

## 2020-02-15 NOTE — Progress Notes (Signed)
Orthopedic Tech Progress Note Patient Details:  Daniel Durham 05/16/1966 [id number]  Ortho Devices Type of Ortho Device: Short leg splint, Stirrup splint Ortho Device/Splint Location: LLE Ortho Device/Splint Interventions: Application   Post Interventions Patient Tolerated: Well Instructions Provided: Care of device   [name] Cassian Torelli 02/15/2020, 6:57 PM

## 2020-02-15 NOTE — ED Provider Notes (Signed)
[facility] EMERGENCY DEPARTMENT Provider Note   CSN: [id number] Arrival date & time:        History Chief Complaint  Patient presents with  . Fall    Daniel Durham is a 54 y.o. male.  HPI Patient presents after a fall from stairs.  States he was coming out the door and slipped.  Hit his head may have knocked out.  Low back and neck pain.  But complaining of severe pain in the left ankle.  Deformity in office fracture versus dislocation.  States he has had beers this morning.  Denies chest or abdominal pain.  Is on chronic pain meds with 10 mg oxycodone 4 times a day.  States the foot does feel little numb.  States he has had 6 back surgeries.    Past Medical History:  Diagnosis Date  . Allergy   . Anxiety   . Asthma    SEASONAL   . Blood transfusion without reported diagnosis   . Chronic back pain    Neurostimulator  . Depression   . Environmental allergies   . Hypertension   . Neuromuscular disorder ([place])   . Neuropathy   . Seasonal allergic conjunctivitis     Patient Active Problem List   Diagnosis Date Noted  . Morbid obesity ([place]) 12/27/2019  . HTN (hypertension) 11/18/2019  . Acute respiratory failure with hypoxia ([place]) 11/17/2019  . Colon cancer screening 05/25/2018  . Anxiety and depression 03/03/2017  . Cervical disc disorder with radiculopathy of cervical region 07/24/2015  . 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 Visit for preventive health examination 02/04/2015  . Chronic back pain greater than 3 months duration 01/23/2015  . 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 Amputation stump complication ([place]) [id number]    Past Surgical History:  Procedure Laterality Date  . BACK SURGERY    . CARDIAC CATHETERIZATION     11/05/10 [facility], [place]): [place] of mLAD, no sign disease. EF 45% in RAO, 60% LAO (51% stress, 54%  rest by NM stress; 57-59% echo 10/2010)  . CARPAL TUNNEL RELEASE     Bilateral  . CERVICAL DISCECTOMY  12/17/2017   titanium plates 12-17-17  . COLONOSCOPY    . HAND AMPUTATION  2007  . POLYPECTOMY     pt states had colon in [place] and he had polyps- he has no idea what type   . ROTATOR CUFF REPAIR     Left  . SPINAL CORD STIMULATOR IMPLANT    . SPINAL CORD STIMULATOR REMOVAL N/A 10/26/2015   Procedure: LUMBAR SPINAL CORD STIMULATOR REMOVAL;  Surgeon: Clydell Hakim, MD;  Location: [facility];  Service: Neurosurgery;  Laterality: N/A;       Family History  Problem Relation Age of Onset  . Diabetes Mother        Living  . Sleep apnea Mother   . Diabetes Father 40       Deceased  . Hypertension Father   . Jaundice Father   . Hemophilia Father   . Alcoholism Father   . Cancer Maternal Grandfather        Throat  . Esophageal cancer Maternal Grandfather   . Cancer Paternal Uncle        Throat  . Esophageal cancer Paternal Uncle   . Asthma Sister   . Diabetes Sister   . Healthy Son  X1  . Healthy Daughter        X1  . Colon polyps Neg Hx   . Colon cancer Neg Hx   . Rectal cancer Neg Hx   . Stomach cancer Neg Hx     Social History   Tobacco Use  . Smoking status: Never Smoker  . Smokeless tobacco: Never Used  Substance Use Topics  . Alcohol use: Yes    Alcohol/week: 0.0 standard drinks    Comment: occ  . Drug use: No    Home Medications Prior to Admission medications   Medication Sig Start Date End Date Taking? Authorizing Provider  ALPRAZolam (XANAX) 0.25 MG tablet Take 1 tablet (0.25 mg total) by mouth 2 (two) times daily as needed for anxiety. Patient taking differently: Take 0.25 mg by mouth daily as needed for anxiety.  02/09/20  Yes Brunetta Jeans, PA-C  atorvastatin (LIPITOR) 10 MG tablet TAKE 1 TABLET(10 MG) BY MOUTH DAILY Patient taking differently: Take 10 mg by mouth daily.  09/23/19  Yes Brunetta Jeans, PA-C  cyclobenzaprine  (FLEXERIL) 10 MG tablet Take 1 tablet (10 mg total) by mouth at bedtime. Patient taking differently: Take 10 mg by mouth daily as needed for muscle spasms.  02/06/20  Yes Brunetta Jeans, PA-C  gabapentin (NEURONTIN) 300 MG capsule Take 1 capsule each morning and midday along with your 600 mg dose to make a total of 900 mg Patient taking differently: Take 900 mg by mouth in the morning and at bedtime.  02/06/20  Yes Brunetta Jeans, PA-C  gabapentin (NEURONTIN) 600 MG tablet Take 1 tablet (600 mg total) by mouth 3 (three) times daily. Patient taking differently: Take 600 mg by mouth daily. In the afternoon. 02/06/20  Yes Brunetta Jeans, PA-C  ipratropium-albuterol (DUONEB) 0.5-2.5 (3) MG/3ML SOLN Take 3 mLs by nebulization 3 (three) times daily. 11/21/19  Yes Vann, Jessica U, DO  lisinopril (ZESTRIL) 20 MG tablet TAKE 1 TABLET(20 MG) BY MOUTH DAILY Patient taking differently: Take 20 mg by mouth daily.  05/27/19  Yes Brunetta Jeans, PA-C  montelukast (SINGULAIR) 10 MG tablet Take 10 mg by mouth daily.  01/06/20  Yes [provider]  oxyCODONE-acetaminophen (PERCOCET) 10-325 MG tablet TK 1 T PO Q 6 H PRN Patient taking differently: Take 1 tablet by mouth every 6 (six) hours as needed.  02/09/20  Yes Brunetta Jeans, PA-C  sertraline (ZOLOFT) 50 MG tablet Take 1 tablet (50 mg total) by mouth daily. 10/17/19  Yes Brunetta Jeans, PA-C  tiZANidine (ZANAFLEX) 2 MG tablet Take 2 mg by mouth daily as needed for muscle spasms.    Yes [provider]  docusate sodium (COLACE) 100 MG capsule Take 1 capsule (100 mg total) by mouth 2 (two) times daily. Patient not taking: Reported on 02/15/2020 11/23/19   Candee Furbish, MD  zinc sulfate 220 (50 Zn) MG capsule Take 1 capsule (220 mg total) by mouth daily. Patient not taking: Reported on 02/15/2020 11/22/19   Radene Gunning, NP    Allergies    Aspirin and Hydrocodone  Review of Systems   Review of Systems  Constitutional: Negative for  appetite change.  HENT: Negative for congestion.   Respiratory: Negative for shortness of breath.   Gastrointestinal: Negative for abdominal pain.  Genitourinary: Negative for flank pain.  Musculoskeletal: Positive for back pain and neck pain.  Neurological: Positive for numbness. Negative for headaches.  Psychiatric/Behavioral: Negative for confusion.    Physical Exam  Updated Vital Signs BP (!) 125/92   Pulse 76   Temp 98.7 F (37.1 C) (Oral)   Resp (!) 22   Ht 5\' 7"  (1.702 m)   Wt 113.4 kg   SpO2 95%   BMI 39.16 kg/m   Physical Exam Vitals reviewed.  HENT:     Head: Normocephalic.  Eyes:     Extraocular Movements: Extraocular movements intact.     Pupils: Pupils are equal, round, and reactive to light.  Neck:     Comments: Cervical collar in place.  Mild cervical spine tenderness without deformity. Cardiovascular:     Rate and Rhythm: Regular rhythm.  Abdominal:     Tenderness: There is no abdominal tenderness.  Musculoskeletal:     Comments: Lumbar tenderness.  No deformity.  Left foot rotated laterally with deformity at the ankle.  Dorsalis pedis pulse intact.  Sensation grossly intact.  No knee deformity.  No tenderness over knee.  Contralateral lower extremity and bilateral upper extremities without tenderness.  Patient has had previous left mid forearm amputation.  Mild abrasion to elbow and stump without obvious bony tenderness or laceration  Skin:    General: Skin is warm.     Capillary Refill: Capillary refill takes less than 2 seconds.  Neurological:     Mental Status: He is alert and oriented to person, place, and time.  Psychiatric:     Comments: Patient is somewhat loud.     ED Results / Procedures / Treatments   Labs (all labs ordered are listed, but only abnormal results are displayed) Labs Reviewed  CBC WITH DIFFERENTIAL/PLATELET - Abnormal; Notable for the following components:      Result Value   MCH 34.2 (*)    All other components within  normal limits  COMPREHENSIVE METABOLIC PANEL - Abnormal; Notable for the following components:   CO2 19 (*)    Glucose, Bld 104 (*)    Calcium 8.8 (*)    AST 72 (*)    ALT 61 (*)    Anion gap 16 (*)    All other components within normal limits  ETHANOL - Abnormal; Notable for the following components:   Alcohol, Ethyl (B) 193 (*)    All other components within normal limits    EKG None  Radiology CT Head Wo Contrast  Result Date: 02/15/2020 CLINICAL DATA:  Head trauma, slipped on mirror and fell backwards EXAM: CT HEAD WITHOUT CONTRAST TECHNIQUE: Contiguous axial images were obtained from the base of the skull through the vertex without intravenous contrast. COMPARISON:  None. FINDINGS: Brain: No evidence of acute territorial infarction, hemorrhage, hydrocephalus,extra-axial collection or mass lesion/mass effect. Normal gray-white differentiation. Ventricles are normal in size and contour. Vascular: No hyperdense vessel or unexpected calcification. Skull: The skull is intact. No fracture or focal lesion identified. Sinuses/Orbits: The visualized paranasal sinuses and mastoid air cells are clear. The orbits and globes intact. Other: None Cervical spine: Alignment: Physiologic Skull base and vertebrae: Visualized skull base is intact. No atlanto-occipital dissociation. The vertebral body heights are well maintained. The patient is status post ACDF at C6-C7. There is solid interbody fusion seen across the disc space. No periprosthetic lucency or hardware fracture seen. No fracture or pathologic osseous lesion seen. Soft tissues and spinal canal: The visualized paraspinal soft tissues are unremarkable. No prevertebral soft tissue swelling is seen. The spinal canal is grossly unremarkable, no large epidural collection or significant canal narrowing. Disc levels: No significant canal or neural foraminal narrowing is noted. Upper chest:  Mild biapical ground-glass opacity, likely atelectasis. Thoracic  inlet is within normal limits. Other: None IMPRESSION: 1.  No acute intracranial abnormality. 2.  No acute fracture or malalignment of the spine. 3. Status post ACDF at C6-C7 without hardware complication. Electronically Signed   By: Prudencio Pair M.D.   On: 02/15/2020 19:29   CT Cervical Spine Wo Contrast  Result Date: 02/15/2020 CLINICAL DATA:  Head trauma, slipped on mirror and fell backwards EXAM: CT HEAD WITHOUT CONTRAST TECHNIQUE: Contiguous axial images were obtained from the base of the skull through the vertex without intravenous contrast. COMPARISON:  None. FINDINGS: Brain: No evidence of acute territorial infarction, hemorrhage, hydrocephalus,extra-axial collection or mass lesion/mass effect. Normal gray-white differentiation. Ventricles are normal in size and contour. Vascular: No hyperdense vessel or unexpected calcification. Skull: The skull is intact. No fracture or focal lesion identified. Sinuses/Orbits: The visualized paranasal sinuses and mastoid air cells are clear. The orbits and globes intact. Other: None Cervical spine: Alignment: Physiologic Skull base and vertebrae: Visualized skull base is intact. No atlanto-occipital dissociation. The vertebral body heights are well maintained. The patient is status post ACDF at C6-C7. There is solid interbody fusion seen across the disc space. No periprosthetic lucency or hardware fracture seen. No fracture or pathologic osseous lesion seen. Soft tissues and spinal canal: The visualized paraspinal soft tissues are unremarkable. No prevertebral soft tissue swelling is seen. The spinal canal is grossly unremarkable, no large epidural collection or significant canal narrowing. Disc levels: No significant canal or neural foraminal narrowing is noted. Upper chest: Mild biapical ground-glass opacity, likely atelectasis. Thoracic inlet is within normal limits. Other: None IMPRESSION: 1.  No acute intracranial abnormality. 2.  No acute fracture or malalignment  of the spine. 3. Status post ACDF at C6-C7 without hardware complication. Electronically Signed   By: Prudencio Pair M.D.   On: 02/15/2020 19:29   CT Lumbar Spine Wo Contrast  Result Date: 02/15/2020 CLINICAL DATA:  54 year old male status post slip and fall backwards. Low back pain. EXAM: CT LUMBAR SPINE WITHOUT CONTRAST TECHNIQUE: Multidetector CT imaging of the lumbar spine was performed without intravenous contrast administration. Multiplanar CT image reconstructions were also generated. COMPARISON:  Lumbar spine CT 07/30/2015. FINDINGS: Segmentation: Mildly transitional anatomy, same numbering system used as in 2016 designating the lowest open disc space L5-S1, but there are hypoplastic ribs at L1 (more so the right series 5, image 27). Correlation with radiographs is recommended prior to any operative intervention. Alignment: Stable straightening of lumbar lordosis. No spondylolisthesis. Vertebrae: T12 and lumbar vertebrae appear intact. Intact visible sacrum and SI joints. Paraspinal and other soft tissues: Partially visible fatty pancreatic atrophy. Otherwise negative visible noncontrast abdominal and pelvic viscera. Paraspinal soft tissues appear within normal limits, a previously-seen thoracic spinal stimulator device has been removed. Disc levels: T11-T12: Moderate right side facet hypertrophy with moderate to severe right T11 foraminal stenosis (series 5, image 1). T12-L1:  Negative. L1-L2:  Negative. L2-L3: Minimal disc bulge and mild facet hypertrophy appears stable since 2016. No convincing stenosis. L3-L4: Mild circumferential disc bulge and facet hypertrophy appears stable since 2016 with up to mild spinal and bilateral L3 foraminal stenosis. L4-L5: Mild circumferential disc bulge and facet hypertrophy. No convincing spinal stenosis. Mild L4 foraminal stenosis appears stable and in part related to chronic endplate spurring. L5-S1: Interval postoperative changes to the left lamina (series 4 image  103). Suspect architectural distortion at the left lateral recess. Mild facet hypertrophy. Residual disc bulge and endplate spurring. No convincing spinal stenosis.  Mild bilateral L5 foraminal stenosis appears stable. IMPRESSION: 1. No acute traumatic injury identified in the lumbar spine. 2. Mildly transitional anatomy, same numbering system used on a 2016 lumbar CT. 3. Thoracic spinal stimulator device removed since 2016. And interval postoperative changes to the left lamina at L5-S1. 4. 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 And up to mild L3 through L5 foraminal stenosis appears stable. 5. Moderate to severe right T11 neural foraminal stenosis related to facet degeneration. Electronically Signed   By: Genevie Ann M.D.   On: 02/15/2020 19:36   CT Ankle Left Wo Contrast  Result Date: 02/15/2020 CLINICAL DATA:  Ankle fracture after fall, post reduction EXAM: CT OF THE LEFT ANKLE WITHOUT CONTRAST TECHNIQUE: Multidetector CT imaging of the left ankle was performed according to the standard protocol. Multiplanar CT image reconstructions were also generated. COMPARISON:  Radiograph same day FINDINGS: Bones/Joint/Cartilage There is tiny osseous chip fractures seen off the medial malleolus. There remains slight widening of the medial malleolus measuring up to 5 mm. Comminuted intra-articular mildly displaced fracture seen of the posterior malleolus. There tiny osseous fragment seen at the anterior tibiotalar joint. A mildly displaced obliquely oriented fracture seen of the distal fibula at the level of the ankle mortise with small surrounding fracture fragments within this seen is most CIS. No other definite fractures are identified. A small os navicular is present. Ligaments Suboptimally assessed by CT. Muscles and Tendons The muscles surrounding the ankle appear to be intact. The flexor and extensor tendons are intact. The Achilles tendon is intact. Soft tissues Diffuse soft tissue swelling is seen around  the ankle, predominantly around the medial malleolus. There is a small ankle joint effusion present. Heel pad inflammation is present. IMPRESSION: 1. The patient is status post reduction of the trimalleolar fracture as described above. There remains slight widening of the medial clear space however. 2. Tiny osseous fragment seen at the medial anterior talar dome which could represent a small chip fracture. 3. Small ankle joint effusion and significant surrounding soft tissue edema. Electronically Signed   By: Prudencio Pair M.D.   On: 02/15/2020 19:36   DG Ankle Left Port  Result Date: 02/15/2020 CLINICAL DATA:  Post reduction EXAM: PORTABLE LEFT ANKLE - 2 VIEW COMPARISON:  Radiograph same day FINDINGS: There is been interval reduction of the fracture dislocation of the ankle. The ankle mortise appears to be in near anatomic alignment. There remains a obliquely oriented mildly displaced distal fibular fracture. IMPRESSION: Status post reduction of ankle fracture dislocation in near anatomic alignment. Electronically Signed   By: Prudencio Pair M.D.   On: 02/15/2020 18:23   DG Ankle Left Port  Result Date: 02/15/2020 CLINICAL DATA:  Recent fall with ankle deformity, initial encounter EXAM: PORTABLE LEFT ANKLE - 2 VIEW COMPARISON:  None. FINDINGS: There is posterolateral dislocation of the talus with respect to the distal tibia. Oblique fracture through the distal fibula is noted. Some regularity in the region of the posterior malleolus is seen likely related to undisplaced fracture. IMPRESSION: Distal fibular fracture with posterolateral dislocation of the talus with respect to the distal tibia. There is likely a posterior malleolar fracture present as well. Electronically Signed   By: Inez Catalina M.D.   On: 02/15/2020 17:28    Procedures .Sedation  Date/Time: 02/15/2020 7:50 PM Performed by: Davonna Belling, MD Authorized by: Davonna Belling, MD   Consent:    Consent obtained:  Written   Consent  given by:  Patient   Risks discussed:  Allergic reaction, nausea, dysrhythmia, prolonged hypoxia resulting in organ damage, respiratory compromise necessitating ventilatory assistance and intubation and vomiting   Alternatives discussed:  Analgesia without sedation Universal protocol:    Immediately prior to procedure a time out was called: yes     Patient identity confirmation method:  Arm band and verbally with patient Indications:    Procedure performed:  Fracture reduction Pre-sedation assessment:    Time since last food or drink:  8 hrs   ASA classification: class 2 - patient with mild systemic disease     Neck mobility: reduced     Mouth opening:  1 finger width   Thyromental distance:  3 finger widths   Mallampati score:  Unable to assess   Pre-sedation assessments completed and reviewed: airway patency, cardiovascular function, hydration status, mental status and nausea/vomiting   Immediate pre-procedure details:    Reassessment: Patient reassessed immediately prior to procedure     Reviewed: vital signs, relevant labs/tests and NPO status     Verified: bag valve mask available   Procedure details (see MAR for exact dosages):    Preoxygenation:  Nasal cannula   Sedation:  Propofol   Intended level of sedation: deep   Analgesia:  Hydromorphone   Intra-procedure monitoring:  Continuous pulse oximetry, frequent vital sign checks, cardiac monitor and blood pressure monitoring   Intra-procedure events: hypoxia     Intra-procedure management:  Supplemental oxygen   Total Provider sedation time (minutes):  10 Post-procedure details:    Attendance: Constant attendance by certified staff until patient recovered     Recovery: Patient returned to pre-procedure baseline     Post-sedation assessments completed and reviewed: airway patency, cardiovascular function, hydration status, mental status, pain level, respiratory function and temperature     Patient is stable for discharge or  admission: yes     Patient tolerance:  Tolerated well, no immediate complications .Ortho Injury Treatment  Date/Time: 02/15/2020 10:29 PM Performed by: Davonna Belling, MD Authorized by: Davonna Belling, MD   Consent:    Consent obtained:  Verbal   Consent given by:  PatientInjury location: ankle Location details: left ankle Injury type: fracture-dislocation Fracture type: trimalleolar Pre-procedure distal perfusion: normal Pre-procedure neurological function: diminished Pre-procedure range of motion: reduced  Anesthesia: Local anesthesia used: no  Patient sedated: Yes. Refer to sedation procedure documentation for details of sedation. Manipulation performed: yes Reduction successful: yes X-ray confirmed reduction: yes Immobilization: splint Splint type: short leg Supplies used: plaster Post-procedure neurovascular assessment: post-procedure neurovascularly intact Patient tolerance: patient tolerated the procedure well with no immediate complications Comments: Good capillary refill and movement in toes    (including critical care time)  Medications Ordered in ED Medications  propofol (DIPRIVAN) 10 mg/mL bolus/IV push 56.7 mg (has no administration in time range)  HYDROmorphone (DILAUDID) injection 2 mg (2 mg Intravenous Given 02/15/20 1713)  propofol (DIPRIVAN) 10 mg/mL bolus/IV push (50 mg Intravenous Given 02/15/20 1746)  HYDROmorphone (DILAUDID) injection 1 mg (1 mg Intravenous Given 02/15/20 1836)  HYDROmorphone (DILAUDID) injection 1 mg (1 mg Intravenous Given 02/15/20 1957)  oxyCODONE-acetaminophen (PERCOCET/ROXICET) 5-325 MG per tablet 2 tablet (2 tablets Oral Given 02/15/20 1951)  HYDROmorphone (DILAUDID) injection 1 mg (1 mg Intravenous Given 02/15/20 2128)    ED Course  I have reviewed the triage vital signs and the nursing notes.  Pertinent labs & imaging results that were available during my care of the patient were reviewed by me and considered in my medical  decision making (see chart for details).  MDM Rules/Calculators/A&P                      Patient with fall.  Head CT and cervical spine and lumbar spine done due to mechanism of injury along with alcohol.  These are reassuring.  However he does have a trimalleolar fracture left ankle.  Required reduction in the ER.  Tolerated well but has continued pain.  Discussed with Dr. Stann Mainland from orthopedic surgery.  No surgery for a week or 2 due to swelling.  Patient has not been able to handle the pain in the ER.  On chronic pain medicines and pain is too severe not tolerating oral control.  Will admit to hospitalist for pain control and states will need more resources at home.  States he already has a motorized cart and ramps but states he would not be able to manage this without extra help. Final Clinical Impression(s) / ED Diagnoses Final diagnoses:  Fall    Rx / DC Orders ED Discharge Orders    None       Davonna Belling, MD 02/15/20 2231

## 2020-02-15 NOTE — ED Triage Notes (Signed)
Pt arrived via [facility] from home c/o of a fall. Pt was coming out the front door and slipped on a mirror in the floor. Pt stated he fell back and hit his head. Pt reported + LOC. Pt has obvious left ankle deformity. Pt admitted to having ETOH on board. Pt states his left ankle is a 9/10. Pt states he is not on blood thinners.

## 2020-02-15 NOTE — Progress Notes (Deleted)
Orthopedic Tech Progress Note Patient Details:  Valjean Or 08-27-1966 [id number]  Ortho Devices Type of Ortho Device: Short leg splint, Stirrup splint Ortho Device/Splint Location: RLE Ortho Device/Splint Interventions: Application   Post Interventions Patient Tolerated: Well Instructions Provided: Care of device   [name] Dewon Mendizabal 02/15/2020, 6:53 PM

## 2020-02-16 ENCOUNTER — Observation Stay (HOSPITAL_COMMUNITY): Payer: Medicare HMO

## 2020-02-16 DIAGNOSIS — S82842A Displaced bimalleolar fracture of left lower leg, initial encounter for closed fracture: Secondary | ICD-10-CM

## 2020-02-16 DIAGNOSIS — R109 Unspecified abdominal pain: Secondary | ICD-10-CM | POA: Diagnosis not present

## 2020-02-16 DIAGNOSIS — S82892A Other fracture of left lower leg, initial encounter for closed fracture: Secondary | ICD-10-CM | POA: Diagnosis present

## 2020-02-16 LAB — LIPASE, BLOOD: Lipase: 23 U/L (ref 11–51)

## 2020-02-16 LAB — SARS CORONAVIRUS 2 BY RT PCR (DIASORIN): SARS Coronavirus 2: NEGATIVE

## 2020-02-16 LAB — CBC
HCT: 42.7 % (ref 39.0–52.0)
Hemoglobin: 14.5 g/dL (ref 13.0–17.0)
MCH: 34.3 pg — ABNORMAL HIGH (ref 26.0–34.0)
MCHC: 34 g/dL (ref 30.0–36.0)
MCV: 100.9 fL — ABNORMAL HIGH (ref 80.0–100.0)
Platelets: 160 10*3/uL (ref 150–400)
RBC: 4.23 MIL/uL (ref 4.22–5.81)
RDW: 12.8 % (ref 11.5–15.5)
WBC: 9.6 10*3/uL (ref 4.0–10.5)
nRBC: 0 % (ref 0.0–0.2)

## 2020-02-16 LAB — COMPREHENSIVE METABOLIC PANEL
ALT: 59 U/L — ABNORMAL HIGH (ref 0–44)
AST: 61 U/L — ABNORMAL HIGH (ref 15–41)
Albumin: 4 g/dL (ref 3.5–5.0)
Alkaline Phosphatase: 72 U/L (ref 38–126)
Anion gap: 11 (ref 5–15)
BUN: 9 mg/dL (ref 6–20)
CO2: 22 mmol/L (ref 22–32)
Calcium: 8.2 mg/dL — ABNORMAL LOW (ref 8.9–10.3)
Chloride: 103 mmol/L (ref 98–111)
Creatinine, Ser: 0.95 mg/dL (ref 0.61–1.24)
GFR calc Af Amer: >60 mL/min (ref >60)
GFR calc non Af Amer: >60 mL/min (ref >60)
Glucose, Bld: 117 mg/dL — ABNORMAL HIGH (ref 70–99)
Potassium: 4.1 mmol/L (ref 3.5–5.1)
Sodium: 136 mmol/L (ref 135–145)
Total Bilirubin: 0.8 mg/dL (ref 0.3–1.2)
Total Protein: 6.6 g/dL (ref 6.5–8.1)

## 2020-02-16 LAB — GLUCOSE, CAPILLARY
Glucose-Capillary: 112 mg/dL — ABNORMAL HIGH (ref 70–99)
Glucose-Capillary: 101 mg/dL — ABNORMAL HIGH (ref 70–99)
Glucose-Capillary: 96 mg/dL (ref 70–99)

## 2020-02-16 LAB — PHOSPHORUS: Phosphorus: 3 mg/dL (ref 2.5–4.6)

## 2020-02-16 LAB — MAGNESIUM: Magnesium: 1.9 mg/dL (ref 1.7–2.4)

## 2020-02-16 MED ORDER — LORAZEPAM 2 MG/ML IJ SOLN
1.0000 mg | INTRAMUSCULAR | Status: AC | PRN
  Administered 2020-02-16: 1 mg via INTRAVENOUS
  Filled 2020-02-16: qty 1

## 2020-02-16 MED ORDER — IOHEXOL 300 MG/ML  SOLN
100.0000 mL | Freq: Once | INTRAMUSCULAR | Status: AC | PRN
  Administered 2020-02-16: 16:00:00 100 mL via INTRAVENOUS

## 2020-02-16 MED ORDER — THIAMINE HCL 100 MG PO TABS
100.0000 mg | ORAL_TABLET | Freq: Every day | ORAL | Status: DC
  Administered 2020-02-16 – 2020-02-28 (×12): 100 mg via ORAL
  Filled 2020-02-16 (×12): qty 1

## 2020-02-16 MED ORDER — FOLIC ACID 1 MG PO TABS
1.0000 mg | ORAL_TABLET | Freq: Every day | ORAL | Status: DC
  Administered 2020-02-16 – 2020-02-28 (×12): 1 mg via ORAL
  Filled 2020-02-16 (×12): qty 1

## 2020-02-16 MED ORDER — LORAZEPAM 1 MG PO TABS
1.0000 mg | ORAL_TABLET | ORAL | Status: AC | PRN
  Administered 2020-02-16: 10:00:00 1 mg via ORAL
  Filled 2020-02-16: qty 1

## 2020-02-16 MED ORDER — THIAMINE HCL 100 MG/ML IJ SOLN
100.0000 mg | Freq: Every day | INTRAMUSCULAR | Status: DC
  Filled 2020-02-16: qty 2

## 2020-02-16 MED ORDER — ADULT MULTIVITAMIN W/MINERALS CH
1.0000 | ORAL_TABLET | Freq: Every day | ORAL | Status: DC
  Administered 2020-02-16 – 2020-02-28 (×12): 1 via ORAL
  Filled 2020-02-16 (×12): qty 1

## 2020-02-16 NOTE — ED Notes (Signed)
RN attempted to call report to 5N, spoke to charge RN. Receiving RN to call back.

## 2020-02-16 NOTE — Consult Note (Signed)
ORTHOPAEDIC CONSULTATION  REQUESTING PHYSICIAN: Shawna Clamp, MD  PCP:  Brunetta Jeans, PA-C  Chief Complaint: Left ankle fracture/dislocation  HPI: Daniel Durham is a 54 y.o. male who complains of left ankle pain following a fall down some stairs yesterday.  He states that he was drinking at the time.  He does drink 5-6 beers a few days out of each week.  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  He currently is complaining of moderate pain at the ankle.  He denies numbness or tingling.  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  He denies tobacco use.  He endorses drinking 5-6 beers on occasion throughout the week.  No history of left ankle surgery or trauma.  Past Medical History:  Diagnosis Date  . Allergy   . Anxiety   . Asthma    SEASONAL   . Blood transfusion without reported diagnosis   . Chronic back pain    Neurostimulator  . Depression   . Environmental allergies   . Hypertension   . Neuromuscular disorder ([place])   . Neuropathy   . Seasonal allergic conjunctivitis    Past Surgical History:  Procedure Laterality Date  . BACK SURGERY    . CARDIAC CATHETERIZATION     11/05/10 [facility] [place], [place]): [place] of mLAD, no sign disease. EF 45% in RAO, 60% LAO (51% stress, 54% rest by NM stress; 57-59% echo 10/2010)  . CARPAL TUNNEL RELEASE     Bilateral  . CERVICAL DISCECTOMY  12/17/2017   titanium plates 12-17-17  . COLONOSCOPY    . HAND AMPUTATION  2007  . POLYPECTOMY     pt states had colon in [place] and he had polyps- he has no idea what type   . ROTATOR CUFF REPAIR     Left  . SPINAL CORD STIMULATOR IMPLANT    . SPINAL CORD STIMULATOR REMOVAL N/A 10/26/2015   Procedure:  LUMBAR SPINAL CORD STIMULATOR REMOVAL;  Surgeon: Clydell Hakim, MD;  Location: [facility];  Service: Neurosurgery;  Laterality: N/A;   Social History   Socioeconomic History  . Marital status: Divorced    Spouse name: Not on file  . Number of children: Not on file  . Years of education: Not on file  . Highest education level: Not on file  Occupational History  . Not on file  Tobacco Use  . Smoking status: Never Smoker  . Smokeless tobacco: Never Used  Substance and Sexual Activity  . Alcohol use: Yes    Alcohol/week: 0.0 standard drinks    Comment: occ  . Drug use: No  . Sexual activity: Yes  Other Topics Concern  . Not on file  Social History Narrative  . Not on file   Social Determinants of Health   Financial Resource Strain:   . Difficulty of Paying Living Expenses:   Food Insecurity:   . Worried About Charity fundraiser in the Last Year:   . Arboriculturist in the Last Year:   Transportation Needs:   . Film/video editor (Medical):   Marland Kitchen Lack of Transportation (Non-Medical):   Physical Activity:   . Days of Exercise per Week:   . Minutes of  Exercise per Session:   Stress:   . Feeling of Stress :   Social Connections:   . Frequency of Communication with Friends and Family:   . Frequency of Social Gatherings with Friends and Family:   . Attends Religious Services:   . Active Member of Clubs or Organizations:   . Attends Archivist Meetings:   Marland Kitchen Marital Status:    Family History  Problem Relation Age of Onset  . Diabetes Mother        Living  . Sleep apnea Mother   . Diabetes Father 38       Deceased  . Hypertension Father   . Jaundice Father   . Hemophilia Father   . Alcoholism Father   . Cancer Maternal Grandfather        Throat  . Esophageal cancer Maternal Grandfather   . Cancer Paternal Uncle        Throat  . Esophageal cancer Paternal Uncle   . Asthma Sister   . Diabetes Sister   . Healthy Son        X1  . Healthy Daughter         X1  . Colon polyps Neg Hx   . Colon cancer Neg Hx   . Rectal cancer Neg Hx   . Stomach cancer Neg Hx    Allergies  Allergen Reactions  . Aspirin Swelling  . Hydrocodone Itching   Prior to Admission medications   Medication Sig Start Date End Date Taking? Authorizing Provider  ALPRAZolam (XANAX) 0.25 MG tablet Take 1 tablet (0.25 mg total) by mouth 2 (two) times daily as needed for anxiety. Patient taking differently: Take 0.25 mg by mouth daily as needed for anxiety.  02/09/20  Yes Brunetta Jeans, PA-C  atorvastatin (LIPITOR) 10 MG tablet TAKE 1 TABLET(10 MG) BY MOUTH DAILY Patient taking differently: Take 10 mg by mouth daily.  09/23/19  Yes Brunetta Jeans, PA-C  cyclobenzaprine (FLEXERIL) 10 MG tablet Take 1 tablet (10 mg total) by mouth at bedtime. Patient taking differently: Take 10 mg by mouth daily as needed for muscle spasms.  02/06/20  Yes Brunetta Jeans, PA-C  gabapentin (NEURONTIN) 300 MG capsule Take 1 capsule each morning and midday along with your 600 mg dose to make a total of 900 mg Patient taking differently: Take 900 mg by mouth in the morning and at bedtime.  02/06/20  Yes Brunetta Jeans, PA-C  gabapentin (NEURONTIN) 600 MG tablet Take 1 tablet (600 mg total) by mouth 3 (three) times daily. Patient taking differently: Take 600 mg by mouth daily. In the afternoon. 02/06/20  Yes Brunetta Jeans, PA-C  ipratropium-albuterol (DUONEB) 0.5-2.5 (3) MG/3ML SOLN Take 3 mLs by nebulization 3 (three) times daily. 11/21/19  Yes Vann, Jessica U, DO  lisinopril (ZESTRIL) 20 MG tablet TAKE 1 TABLET(20 MG) BY MOUTH DAILY Patient taking differently: Take 20 mg by mouth daily.  05/27/19  Yes Brunetta Jeans, PA-C  montelukast (SINGULAIR) 10 MG tablet Take 10 mg by mouth daily.  01/06/20  Yes [provider]  oxyCODONE-acetaminophen (PERCOCET) 10-325 MG tablet TK 1 T PO Q 6 H PRN Patient taking differently: Take 1 tablet by mouth every 6 (six) hours as needed.  02/09/20  Yes  Brunetta Jeans, PA-C  sertraline (ZOLOFT) 50 MG tablet Take 1 tablet (50 mg total) by mouth daily. 10/17/19  Yes Brunetta Jeans, PA-C  tiZANidine (ZANAFLEX) 2 MG tablet Take 2 mg by mouth daily  as needed for muscle spasms.    Yes [provider]  docusate sodium (COLACE) 100 MG capsule Take 1 capsule (100 mg total) by mouth 2 (two) times daily. Patient not taking: Reported on 02/15/2020 11/23/19   Candee Furbish, MD  zinc sulfate 220 (50 Zn) MG capsule Take 1 capsule (220 mg total) by mouth daily. Patient not taking: Reported on 02/15/2020 11/22/19   Radene Gunning, NP   CT Head Wo Contrast  Result Date: 02/15/2020 CLINICAL DATA:  Head trauma, slipped on mirror and fell backwards EXAM: CT HEAD WITHOUT CONTRAST TECHNIQUE: Contiguous axial images were obtained from the base of the skull through the vertex without intravenous contrast. COMPARISON:  None. FINDINGS: Brain: No evidence of acute territorial infarction, hemorrhage, hydrocephalus,extra-axial collection or mass lesion/mass effect. Normal gray-white differentiation. Ventricles are normal in size and contour. Vascular: No hyperdense vessel or unexpected calcification. Skull: The skull is intact. No fracture or focal lesion identified. Sinuses/Orbits: The visualized paranasal sinuses and mastoid air cells are clear. The orbits and globes intact. Other: None Cervical spine: Alignment: Physiologic Skull base and vertebrae: Visualized skull base is intact. No atlanto-occipital dissociation. The vertebral body heights are well maintained. The patient is status post ACDF at C6-C7. There is solid interbody fusion seen across the disc space. No periprosthetic lucency or hardware fracture seen. No fracture or pathologic osseous lesion seen. Soft tissues and spinal canal: The visualized paraspinal soft tissues are unremarkable. No prevertebral soft tissue swelling is seen. The spinal canal is grossly unremarkable, no large epidural collection or  significant canal narrowing. Disc levels: No significant canal or neural foraminal narrowing is noted. Upper chest: Mild biapical ground-glass opacity, likely atelectasis. Thoracic inlet is within normal limits. Other: None IMPRESSION: 1.  No acute intracranial abnormality. 2.  No acute fracture or malalignment of the spine. 3. Status post ACDF at C6-C7 without hardware complication. Electronically Signed   By: Prudencio Pair M.D.   On: 02/15/2020 19:29   CT Cervical Spine Wo Contrast  Result Date: 02/15/2020 CLINICAL DATA:  Head trauma, slipped on mirror and fell backwards EXAM: CT HEAD WITHOUT CONTRAST TECHNIQUE: Contiguous axial images were obtained from the base of the skull through the vertex without intravenous contrast. COMPARISON:  None. FINDINGS: Brain: No evidence of acute territorial infarction, hemorrhage, hydrocephalus,extra-axial collection or mass lesion/mass effect. Normal gray-white differentiation. Ventricles are normal in size and contour. Vascular: No hyperdense vessel or unexpected calcification. Skull: The skull is intact. No fracture or focal lesion identified. Sinuses/Orbits: The visualized paranasal sinuses and mastoid air cells are clear. The orbits and globes intact. Other: None Cervical spine: Alignment: Physiologic Skull base and vertebrae: Visualized skull base is intact. No atlanto-occipital dissociation. The vertebral body heights are well maintained. The patient is status post ACDF at C6-C7. There is solid interbody fusion seen across the disc space. No periprosthetic lucency or hardware fracture seen. No fracture or pathologic osseous lesion seen. Soft tissues and spinal canal: The visualized paraspinal soft tissues are unremarkable. No prevertebral soft tissue swelling is seen. The spinal canal is grossly unremarkable, no large epidural collection or significant canal narrowing. Disc levels: No significant canal or neural foraminal narrowing is noted. Upper chest: Mild biapical  ground-glass opacity, likely atelectasis. Thoracic inlet is within normal limits. Other: None IMPRESSION: 1.  No acute intracranial abnormality. 2.  No acute fracture or malalignment of the spine. 3. Status post ACDF at C6-C7 without hardware complication. Electronically Signed   By: Ebony Cargo.D.  On: 02/15/2020 19:29   CT Lumbar Spine Wo Contrast  Result Date: 02/15/2020 CLINICAL DATA:  54 year old male status post slip and fall backwards. Low back pain. EXAM: CT LUMBAR SPINE WITHOUT CONTRAST TECHNIQUE: Multidetector CT imaging of the lumbar spine was performed without intravenous contrast administration. Multiplanar CT image reconstructions were also generated. COMPARISON:  Lumbar spine CT 07/30/2015. FINDINGS: Segmentation: Mildly transitional anatomy, same numbering system used as in 2016 designating the lowest open disc space L5-S1, but there are hypoplastic ribs at L1 (more so the right series 5, image 27). Correlation with radiographs is recommended prior to any operative intervention. Alignment: Stable straightening of lumbar lordosis. No spondylolisthesis. Vertebrae: T12 and lumbar vertebrae appear intact. Intact visible sacrum and SI joints. Paraspinal and other soft tissues: Partially visible fatty pancreatic atrophy. Otherwise negative visible noncontrast abdominal and pelvic viscera. Paraspinal soft tissues appear within normal limits, a previously-seen thoracic spinal stimulator device has been removed. Disc levels: T11-T12: Moderate right side facet hypertrophy with moderate to severe right T11 foraminal stenosis (series 5, image 1). T12-L1:  Negative. L1-L2:  Negative. L2-L3: Minimal disc bulge and mild facet hypertrophy appears stable since 2016. No convincing stenosis. L3-L4: Mild circumferential disc bulge and facet hypertrophy appears stable since 2016 with up to mild spinal and bilateral L3 foraminal stenosis. L4-L5: Mild circumferential disc bulge and facet hypertrophy. No convincing  spinal stenosis. Mild L4 foraminal stenosis appears stable and in part related to chronic endplate spurring. L5-S1: Interval postoperative changes to the left lamina (series 4 image 103). Suspect architectural distortion at the left lateral recess. Mild facet hypertrophy. Residual disc bulge and endplate spurring. No convincing spinal stenosis. Mild bilateral L5 foraminal stenosis appears stable. IMPRESSION: 1. No acute traumatic injury identified in the lumbar spine. 2. Mildly transitional anatomy, same numbering system used on a 2016 lumbar CT. 3. Thoracic spinal stimulator device removed since 2016. And interval postoperative changes to the left lamina at L5-S1. 4. 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 And up to mild L3 through L5 foraminal stenosis appears stable. 5. Moderate to severe right T11 neural foraminal stenosis related to facet degeneration. Electronically Signed   By: Genevie Ann M.D.   On: 02/15/2020 19:36   CT ABDOMEN PELVIS W CONTRAST  Result Date: 02/16/2020 CLINICAL DATA:  Right upper quadrant abdominal pain for 10 days, fall yesterday EXAM: CT ABDOMEN AND PELVIS WITH CONTRAST TECHNIQUE: Multidetector CT imaging of the abdomen and pelvis was performed using the standard protocol following bolus administration of intravenous contrast. CONTRAST:  156mL OMNIPAQUE IOHEXOL 300 MG/ML  SOLN COMPARISON:  02/06/2020 FINDINGS: Lower chest: No acute abnormality. Hepatobiliary: No solid liver abnormality is seen. Hepatic steatosis. No gallstones, gallbladder wall thickening, or biliary dilatation. Pancreas: Fatty atrophy of the pancreas. No pancreatic ductal dilatation or surrounding inflammatory changes. Spleen: Normal in size without significant abnormality. Adrenals/Urinary Tract: Adrenal glands are unremarkable. Kidneys are normal, without renal calculi, solid lesion, or hydronephrosis. Bladder is unremarkable. Stomach/Bowel: Stomach is within normal limits. Appendix appears normal. No  evidence of bowel wall thickening, distention, or inflammatory changes. Vascular/Lymphatic: No significant vascular findings are present. No enlarged abdominal or pelvic lymph nodes. Reproductive: No mass or other significant abnormality. Other: Fat containing bilateral inguinal hernias. No abdominopelvic ascites. Musculoskeletal: No acute or significant osseous findings. IMPRESSION: 1. No acute CT findings of the abdomen or pelvis to explain right upper quadrant abdominal pain. 2.  No evidence of acute traumatic injury to the abdomen or pelvis. 3.  Hepatic steatosis. Electronically Signed  By: Eddie Candle M.D.   On: 02/16/2020 16:16   CT Ankle Left Wo Contrast  Result Date: 02/15/2020 CLINICAL DATA:  Ankle fracture after fall, post reduction EXAM: CT OF THE LEFT ANKLE WITHOUT CONTRAST TECHNIQUE: Multidetector CT imaging of the left ankle was performed according to the standard protocol. Multiplanar CT image reconstructions were also generated. COMPARISON:  Radiograph same day FINDINGS: Bones/Joint/Cartilage There is tiny osseous chip fractures seen off the medial malleolus. There remains slight widening of the medial malleolus measuring up to 5 mm. Comminuted intra-articular mildly displaced fracture seen of the posterior malleolus. There tiny osseous fragment seen at the anterior tibiotalar joint. A mildly displaced obliquely oriented fracture seen of the distal fibula at the level of the ankle mortise with small surrounding fracture fragments within this seen is most CIS. No other definite fractures are identified. A small os navicular is present. Ligaments Suboptimally assessed by CT. Muscles and Tendons The muscles surrounding the ankle appear to be intact. The flexor and extensor tendons are intact. The Achilles tendon is intact. Soft tissues Diffuse soft tissue swelling is seen around the ankle, predominantly around the medial malleolus. There is a small ankle joint effusion present. Heel pad  inflammation is present. IMPRESSION: 1. The patient is status post reduction of the trimalleolar fracture as described above. There remains slight widening of the medial clear space however. 2. Tiny osseous fragment seen at the medial anterior talar dome which could represent a small chip fracture. 3. Small ankle joint effusion and significant surrounding soft tissue edema. Electronically Signed   By: Prudencio Pair M.D.   On: 02/15/2020 19:36   DG Ankle Left Port  Result Date: 02/15/2020 CLINICAL DATA:  Post reduction EXAM: PORTABLE LEFT ANKLE - 2 VIEW COMPARISON:  Radiograph same day FINDINGS: There is been interval reduction of the fracture dislocation of the ankle. The ankle mortise appears to be in near anatomic alignment. There remains a obliquely oriented mildly displaced distal fibular fracture. IMPRESSION: Status post reduction of ankle fracture dislocation in near anatomic alignment. Electronically Signed   By: Prudencio Pair M.D.   On: 02/15/2020 18:23   DG Ankle Left Port  Result Date: 02/15/2020 CLINICAL DATA:  Recent fall with ankle deformity, initial encounter EXAM: PORTABLE LEFT ANKLE - 2 VIEW COMPARISON:  None. FINDINGS: There is posterolateral dislocation of the talus with respect to the distal tibia. Oblique fracture through the distal fibula is noted. Some regularity in the region of the posterior malleolus is seen likely related to undisplaced fracture. IMPRESSION: Distal fibular fracture with posterolateral dislocation of the talus with respect to the distal tibia. There is likely a posterior malleolar fracture present as well. Electronically Signed   By: Inez Catalina M.D.   On: 02/15/2020 17:28    Positive ROS: All other systems have been reviewed and were otherwise negative with the exception of those mentioned in the HPI and as above.  Physical Exam: General: Alert, no acute distress Cardiovascular: No pedal edema Respiratory: No cyanosis, no use of accessory musculature GI: No  organomegaly, abdomen is soft and non-tender Skin: No lesions in the area of chief complaint Neurologic: Sensation intact distally Psychiatric: Patient is competent for consent with normal mood and affect Lymphatic: No axillary or cervical lymphadenopathy  MUSCULOSKELETAL:  Left lower extremity:  Well-padded splint in place.  Toes are warm and well-perfused with capillary refill under 2 seconds.  He can wiggle toes.  No signs of compartment syndrome.  He does have some scaling dermatitis  along the toes.  Assessment: 1.  Closed left ankle bimalleolar fracture.  Plan: Nicki Reaper and I discussed the nature of his injury and the need for surgical stabilization.  This is an unstable fracture pattern that will need lateral fixation with likely syndesmotic fixation to address the avulsion fracture of the posterior malleolus.  -Unfortunately, due to the displacement and nature of his injury swelling is a concern as it affects his soft tissue envelope.  He is not appropriate for acute internal fixation so that the soft tissues may recover.  I think at the earliest we could potentially do his surgery would be a week from today.  -I will tentatively place him on my operative schedule for next Thursday, April 22.  I am happy to see him in the office to assess the soft tissues next week, if he discharges home.  If he remains in the hospital we are certainly able to perform the surgery here at a scheduled time next Thursday.    -Please call with any questions or concerns.  Otherwise strictly nonweightbearing to the left lower extremity with strict elevation at all times.  -Would recommend Lovenox for DVT prophylaxis up until the day before surgery.    Nicholes Stairs, MD Cell [phone]    02/16/2020 5:58 PM

## 2020-02-16 NOTE — Social Work (Signed)
CSW met with pt at bedside. CSW introduced self and explained her role at the hospital. CSW completed sbirt with pt. Pt scored a 12 on the sbirt scale. Pt states he drinks 5-6 beers most days of the week. Pt stated he does not have a problem with alcohol because he does not drink every day. Pt states when he drinks its after work and he is still able to complete all household tasks.   CSW and pt discussed the effects of alcohol on his health. Pt states that maybe he should cut back o his drinking. Pt stated that his wife and children are very supportive and he doesn't want to hurt them.   CSW offered resources. Pt was receptive to resources. Pt stated "I can stop drinking if I want to but ill hold on to these in case I need them in the future."  Emeterio Reeve, Latanya Presser, [place] Social Worker [phone]

## 2020-02-16 NOTE — Plan of Care (Signed)

## 2020-02-16 NOTE — Progress Notes (Signed)
Patient to CT via bed.

## 2020-02-16 NOTE — Plan of Care (Signed)
  Problem: Pain Managment: Goal: General experience of comfort will improve Outcome: Progressing   Problem: Safety: Goal: Ability to remain free from injury will improve Outcome: Progressing   Problem: Skin Integrity: Goal: Risk for impaired skin integrity will decrease Outcome: Progressing   

## 2020-02-16 NOTE — Progress Notes (Addendum)
PROGRESS NOTE    Daniel Durham  [id number] DOB: 27-Feb-1966 DOA: 02/15/2020 PCP: Brunetta Jeans, PA-C   Brief Narrative:  Daniel Durham is a 54 y.o. male with history of hypertension, chronic back pain asthma was brought to the ER after patient had a fall.  Patient states he slipped and fell today at his head and lost consciousness briefly.  He hurt his left ankle and was brought to the ER.  Denies any chest pain or shortness of breath.  Has been worked up for his gallstones and has follow-up with surgery next week.  He drinks alcohol every day.  ED Course: In the ER patient had x-ray showed left ankle fracture for which Dr. Stann Mainland orthopedic surgeon was consulted and requested follow-up with orthopedics in a week for further management since patient's swelling is to decrease before surgery.  This patient has significant pain and has left upper forearm amputation can find it difficult to walk with crutches and will need further help.  Patient has significant pain and difficulty ambulate admitted for further observation.  Labs are largely unremarkable except for most some macrocytosis.  Covid test was negative.  CT head C-spine and L-spine were unremarkable.  Assessment & Plan:   Principal Problem:   Ankle fracture, bimalleolar, closed, left, initial encounter Active Problems:   HTN (hypertension)   Closed left ankle fracture  1. Left ankle fracture for which patient will need follow-up with Dr. Stann Mainland orthopedic surgeon as outpatient for further management but since patient has significant pain and difficulty ambulating patient admitted for observation we will get physical therapy consult.   Adequate pain control   2. Hypertension on lisinopril. 3. History of alcohol abuse on CIWA protocol. 4. History of asthma presently not wheezing. 5. Chronic pain on oxycodone Flexeril and gabapentin. 6. Right upper quadrant abdominal pain : Surgery consulted will follow up  recommendation history of gallstones for which patient has follow-up with general surgery.   DVT prophylaxis: Lovenox. Code Status: Full code. Family Communication: Patient's wife. Disposition Plan: Home. Consults called: ER physician discussed with Dr. Stann Mainland.  Physical therapy. Admission status: Inpatient   Consultants:   General surgery  Orthopedics  Procedures:  Antimicrobials:  Anti-infectives (From admission, onward)   None      Subjective: Patient was seen and examined at bedside.  He reports having right upper quadrant abdominal pain which is getting worse.  He reports nausea but denies any vomiting.  Objective: Vitals:   02/16/20 0825 02/16/20 0950 02/16/20 0956 02/16/20 1334  BP: 136/71 136/71 136/71 (!) 150/76  Pulse: 81  83 88  Resp: 17   17  Temp: 98.3 F (36.8 C)   98.8 F (37.1 C)  TempSrc: Oral   Oral  SpO2: 96%   (!) 89%  Weight:      Height:        Intake/Output Summary (Last 24 hours) at 02/16/2020 1618 Last data filed at 02/16/2020 1500 Gross per 24 hour  Intake 960 ml  Output 500 ml  Net 460 ml   Filed Weights   02/15/20 1654  Weight: 113.4 kg    Examination:  General exam: Appears calm and comfortable  Respiratory system: Clear to auscultation. Respiratory effort normal. Cardiovascular system: S1 & S2 heard, RRR. No JVD, murmurs, rubs, gallops or clicks. No pedal edema. Gastrointestinal system: Abdomen is nondistended, tenderness noted in RUQ,. No organomegaly or masses felt. Normal bowel sounds heard. Central nervous system: Alert and oriented. No focal neurological deficits. Extremities: Symmetric 5  x 5 power. Skin: No rashes, lesions or ulcers Psychiatry: Judgement and insight appear normal. Mood & affect appropriate.     Data Reviewed: I have personally reviewed following labs and imaging studies  CBC: Recent Labs  Lab 02/15/20 1654 02/16/20 0508  WBC 5.4 9.6  NEUTROABS 2.9  --   HGB 16.1 14.5  HCT 46.4 42.7   MCV 98.5 100.9*  PLT 183 0000000   Basic Metabolic Panel: Recent Labs  Lab 02/15/20 1654 02/16/20 0508  NA 139 136  K 4.1 4.1  CL 104 103  CO2 19* 22  GLUCOSE 104* 117*  BUN 10 9  CREATININE 0.95 0.95  CALCIUM 8.8* 8.2*  MG  --  1.9  PHOS  --  3.0   GFR: Estimated Creatinine Clearance: 109.4 mL/min (by C-G formula based on SCr of 0.95 mg/dL). Liver Function Tests: Recent Labs  Lab 02/15/20 1654 02/16/20 0508  AST 72* 61*  ALT 61* 59*  ALKPHOS 74 72  BILITOT 0.8 0.8  PROT 7.0 6.6  ALBUMIN 3.9 4.0   Recent Labs  Lab 02/16/20 1414  LIPASE 23   No results for input(s): AMMONIA in the last 168 hours. Coagulation Profile: No results for input(s): INR, PROTIME in the last 168 hours. Cardiac Enzymes: No results for input(s): CKTOTAL, CKMB, CKMBINDEX, TROPONINI in the last 168 hours. BNP (last 3 results) No results for input(s): PROBNP in the last 8760 hours. HbA1C: No results for input(s): HGBA1C in the last 72 hours. CBG: Recent Labs  Lab 02/16/20 0646 02/16/20 1139  GLUCAP 112* 101*   Lipid Profile: No results for input(s): CHOL, HDL, LDLCALC, TRIG, CHOLHDL, LDLDIRECT in the last 72 hours. Thyroid Function Tests: No results for input(s): TSH, T4TOTAL, FREET4, T3FREE, THYROIDAB in the last 72 hours. Anemia Panel: No results for input(s): VITAMINB12, FOLATE, FERRITIN, TIBC, IRON, RETICCTPCT in the last 72 hours. Sepsis Labs: No results for input(s): PROCALCITON, LATICACIDVEN in the last 168 hours.  Recent Results (from the past 240 hour(s))  Urine culture     Status: None   Collection Time: 02/06/20  8:39 PM   Specimen: Urine, Random  Result Value Ref Range Status   Specimen Description   Final    URINE, RANDOM Performed at [facility], [address] [place], [place] [address]    Special Requests   Final    NONE Performed at [facility], [address] [place], [place] [address]    Culture   Final    NO GROWTH Performed at  [facility], [place] [address] [place], [place] [address]    Report Status 02/08/2020 FINAL  Final  SARS Coronavirus 2 by RT PCR     Status: None   Collection Time: 02/16/20  4:37 AM  Result Value Ref Range Status   SARS Coronavirus 2 NEGATIVE NEGATIVE Final    Comment: (NOTE) Result indicates the ABSENCE of SARS-CoV-2 RNA in the patient specimen.  The lowest concentration of SARS-CoV-2 viral copies this assay can detect in nasopharyngeal swab specimens is 500 copies / mL.  A negative result does not preclude SARS-CoV-2 infection and should not be used as the sole basis for patient management decisions. A negative result may occur with improper specimen collection / handling, submission of a specimen other than nasopharyngeal swab, presence of viral mutation(s) within the areas targeted by this assay, and inadequate number of viral copies (<500 copies / mL) present.  Negative results must be combined with  clinical observations, patient history, and epidemiological information.  The expected result is NEGATIVE.  Patient Fact Sheet:  BlogSelections.co.uk   Provider Fact Sheet:  https://lucas.com/   This test is not yet approved or cleared by the [place] FDA and  has been authorized for  detection and/or diagnosis of SARS-CoV-2 by FDA under an Emergency Use Authorization (EUA).  This EUA will remain in effect (meaning this test can be used) for the duration of  the COVID-19 declaration under Section 564(b)(1) of the Act, 21 U.S.C. section 360bbb-3(b)(1), unless the authorization is terminated or revoked sooner Performed at [facility], [place] [address], [place], [place] [address]      Radiology Studies: CT Head Wo Contrast  Result Date: 02/15/2020 CLINICAL DATA:  Head trauma, slipped on mirror and fell backwards EXAM: CT HEAD WITHOUT CONTRAST TECHNIQUE: Contiguous axial images were obtained from the base of the skull  through the vertex without intravenous contrast. COMPARISON:  None. FINDINGS: Brain: No evidence of acute territorial infarction, hemorrhage, hydrocephalus,extra-axial collection or mass lesion/mass effect. Normal gray-white differentiation. Ventricles are normal in size and contour. Vascular: No hyperdense vessel or unexpected calcification. Skull: The skull is intact. No fracture or focal lesion identified. Sinuses/Orbits: The visualized paranasal sinuses and mastoid air cells are clear. The orbits and globes intact. Other: None Cervical spine: Alignment: Physiologic Skull base and vertebrae: Visualized skull base is intact. No atlanto-occipital dissociation. The vertebral body heights are well maintained. The patient is status post ACDF at C6-C7. There is solid interbody fusion seen across the disc space. No periprosthetic lucency or hardware fracture seen. No fracture or pathologic osseous lesion seen. Soft tissues and spinal canal: The visualized paraspinal soft tissues are unremarkable. No prevertebral soft tissue swelling is seen. The spinal canal is grossly unremarkable, no large epidural collection or significant canal narrowing. Disc levels: No significant canal or neural foraminal narrowing is noted. Upper chest: Mild biapical ground-glass opacity, likely atelectasis. Thoracic inlet is within normal limits. Other: None IMPRESSION: 1.  No acute intracranial abnormality. 2.  No acute fracture or malalignment of the spine. 3. Status post ACDF at C6-C7 without hardware complication. Electronically Signed   By: Prudencio Pair M.D.   On: 02/15/2020 19:29   CT Cervical Spine Wo Contrast  Result Date: 02/15/2020 CLINICAL DATA:  Head trauma, slipped on mirror and fell backwards EXAM: CT HEAD WITHOUT CONTRAST TECHNIQUE: Contiguous axial images were obtained from the base of the skull through the vertex without intravenous contrast. COMPARISON:  None. FINDINGS: Brain: No evidence of acute territorial infarction,  hemorrhage, hydrocephalus,extra-axial collection or mass lesion/mass effect. Normal gray-white differentiation. Ventricles are normal in size and contour. Vascular: No hyperdense vessel or unexpected calcification. Skull: The skull is intact. No fracture or focal lesion identified. Sinuses/Orbits: The visualized paranasal sinuses and mastoid air cells are clear. The orbits and globes intact. Other: None Cervical spine: Alignment: Physiologic Skull base and vertebrae: Visualized skull base is intact. No atlanto-occipital dissociation. The vertebral body heights are well maintained. The patient is status post ACDF at C6-C7. There is solid interbody fusion seen across the disc space. No periprosthetic lucency or hardware fracture seen. No fracture or pathologic osseous lesion seen. Soft tissues and spinal canal: The visualized paraspinal soft tissues are unremarkable. No prevertebral soft tissue swelling is seen. The spinal canal is grossly unremarkable, no large epidural collection or significant canal narrowing. Disc levels: No significant canal or neural foraminal narrowing is noted. Upper chest: Mild biapical ground-glass opacity, likely atelectasis. Thoracic  inlet is within normal limits. Other: None IMPRESSION: 1.  No acute intracranial abnormality. 2.  No acute fracture or malalignment of the spine. 3. Status post ACDF at C6-C7 without hardware complication. Electronically Signed   By: Prudencio Pair M.D.   On: 02/15/2020 19:29   CT Lumbar Spine Wo Contrast  Result Date: 02/15/2020 CLINICAL DATA:  54 year old male status post slip and fall backwards. Low back pain. EXAM: CT LUMBAR SPINE WITHOUT CONTRAST TECHNIQUE: Multidetector CT imaging of the lumbar spine was performed without intravenous contrast administration. Multiplanar CT image reconstructions were also generated. COMPARISON:  Lumbar spine CT 07/30/2015. FINDINGS: Segmentation: Mildly transitional anatomy, same numbering system used as in 2016  designating the lowest open disc space L5-S1, but there are hypoplastic ribs at L1 (more so the right series 5, image 27). Correlation with radiographs is recommended prior to any operative intervention. Alignment: Stable straightening of lumbar lordosis. No spondylolisthesis. Vertebrae: T12 and lumbar vertebrae appear intact. Intact visible sacrum and SI joints. Paraspinal and other soft tissues: Partially visible fatty pancreatic atrophy. Otherwise negative visible noncontrast abdominal and pelvic viscera. Paraspinal soft tissues appear within normal limits, a previously-seen thoracic spinal stimulator device has been removed. Disc levels: T11-T12: Moderate right side facet hypertrophy with moderate to severe right T11 foraminal stenosis (series 5, image 1). T12-L1:  Negative. L1-L2:  Negative. L2-L3: Minimal disc bulge and mild facet hypertrophy appears stable since 2016. No convincing stenosis. L3-L4: Mild circumferential disc bulge and facet hypertrophy appears stable since 2016 with up to mild spinal and bilateral L3 foraminal stenosis. L4-L5: Mild circumferential disc bulge and facet hypertrophy. No convincing spinal stenosis. Mild L4 foraminal stenosis appears stable and in part related to chronic endplate spurring. L5-S1: Interval postoperative changes to the left lamina (series 4 image 103). Suspect architectural distortion at the left lateral recess. Mild facet hypertrophy. Residual disc bulge and endplate spurring. No convincing spinal stenosis. Mild bilateral L5 foraminal stenosis appears stable. IMPRESSION: 1. No acute traumatic injury identified in the lumbar spine. 2. Mildly transitional anatomy, same numbering system used on a 2016 lumbar CT. 3. Thoracic spinal stimulator device removed since 2016. And interval postoperative changes to the left lamina at L5-S1. 4. 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 And up to mild L3 through L5 foraminal stenosis appears stable. 5. Moderate to  severe right T11 neural foraminal stenosis related to facet degeneration. Electronically Signed   By: Genevie Ann M.D.   On: 02/15/2020 19:36   CT Ankle Left Wo Contrast  Result Date: 02/15/2020 CLINICAL DATA:  Ankle fracture after fall, post reduction EXAM: CT OF THE LEFT ANKLE WITHOUT CONTRAST TECHNIQUE: Multidetector CT imaging of the left ankle was performed according to the standard protocol. Multiplanar CT image reconstructions were also generated. COMPARISON:  Radiograph same day FINDINGS: Bones/Joint/Cartilage There is tiny osseous chip fractures seen off the medial malleolus. There remains slight widening of the medial malleolus measuring up to 5 mm. Comminuted intra-articular mildly displaced fracture seen of the posterior malleolus. There tiny osseous fragment seen at the anterior tibiotalar joint. A mildly displaced obliquely oriented fracture seen of the distal fibula at the level of the ankle mortise with small surrounding fracture fragments within this seen is most CIS. No other definite fractures are identified. A small os navicular is present. Ligaments Suboptimally assessed by CT. Muscles and Tendons The muscles surrounding the ankle appear to be intact. The flexor and extensor tendons are intact. The Achilles tendon is intact. Soft tissues Diffuse soft tissue swelling  is seen around the ankle, predominantly around the medial malleolus. There is a small ankle joint effusion present. Heel pad inflammation is present. IMPRESSION: 1. The patient is status post reduction of the trimalleolar fracture as described above. There remains slight widening of the medial clear space however. 2. Tiny osseous fragment seen at the medial anterior talar dome which could represent a small chip fracture. 3. Small ankle joint effusion and significant surrounding soft tissue edema. Electronically Signed   By: Prudencio Pair M.D.   On: 02/15/2020 19:36   DG Ankle Left Port  Result Date: 02/15/2020 CLINICAL DATA:  Post  reduction EXAM: PORTABLE LEFT ANKLE - 2 VIEW COMPARISON:  Radiograph same day FINDINGS: There is been interval reduction of the fracture dislocation of the ankle. The ankle mortise appears to be in near anatomic alignment. There remains a obliquely oriented mildly displaced distal fibular fracture. IMPRESSION: Status post reduction of ankle fracture dislocation in near anatomic alignment. Electronically Signed   By: Prudencio Pair M.D.   On: 02/15/2020 18:23   DG Ankle Left Port  Result Date: 02/15/2020 CLINICAL DATA:  Recent fall with ankle deformity, initial encounter EXAM: PORTABLE LEFT ANKLE - 2 VIEW COMPARISON:  None. FINDINGS: There is posterolateral dislocation of the talus with respect to the distal tibia. Oblique fracture through the distal fibula is noted. Some regularity in the region of the posterior malleolus is seen likely related to undisplaced fracture. IMPRESSION: Distal fibular fracture with posterolateral dislocation of the talus with respect to the distal tibia. There is likely a posterior malleolar fracture present as well. Electronically Signed   By: Inez Catalina M.D.   On: 02/15/2020 17:28    Scheduled Meds: . atorvastatin  10 mg Oral q1800  . enoxaparin (LOVENOX) injection  55 mg Subcutaneous Q24H  . folic acid  1 mg Oral Daily  . gabapentin  900 mg Oral BID  . lisinopril  20 mg Oral Daily  . montelukast  10 mg Oral QHS  . multivitamin with minerals  1 tablet Oral Daily  . sertraline  50 mg Oral Daily  . thiamine  100 mg Oral Daily   Or  . thiamine  100 mg Intravenous Daily   Continuous Infusions:   LOS: 0 days    Time spent: 25 mins.    Shawna Clamp, MD [facility]   If 7PM-7AM, please contact night-coverage

## 2020-02-16 NOTE — Consult Note (Signed)
Daniel Durham [birth year]  [id number].    Requesting MD: Dr. Dwyane Dee  Chief Complaint/Reason for Consult: RUQ abdominal pain  HPI: Daniel Durham is a 54 y.o. with a history of hypertension, chronic back pain, asthma and alcohol use who we are asked to see for right upper quadrant abdominal pain.  Patient reports that approximately 10-11 days ago he began having constant, sharp, right upper quadrant abdominal pain without radiation.  He was seen in the ED on 02/06/20 where he underwent lab work and imaging.  At that time, WBC 7.5, lipase within normal limits, ALT mildly elevated at 46, and LFTs otherwise within normal limits.  Right upper quadrant ultrasound showed tiny gallstones and sludge without evidence of acute cholecystitis.  CT A/P showed no evidence of acute cholecystitis or other intra-abdominal process.  He was discharged and scheduled a follow-up appointment with [facility]. Patient notes that his pain has continued after discharge from the ED.  He reports that his pain is a constant 6/10 and would worsen after eating fatty/large meals.  He reports he would also have associated burping, belching, occasional nausea as well as upper abdominal distention after eating that would last for approximately 1-2 hours before resolving without intervention.  Yesterday the patient was stepping out of his back door, when he slipped on a piece of broken glass on the floor, landing on his back and hitting his head. He did not land on his abdomen. Work-up showed a trimalleolar fracture of the left side that was reduced in the emergency department and splinted.  He was admitted to the medicine service.  We are asked to see for ongoing abdominal pain.  He reports that is no different than prior to his fall.  No new areas of pain in his abdomen.  WBC yesterday and today within normal limits.  AST 72>61, ALT 61>59, alk phos within normal limits, T bili within normal limits.  Patient's ethanol was noted to be elevated  at 193.  He did have some hepatic steatosis changes noted on prior CT imaging.  He reports that he drinks roughly 5 beers per day, 3-5 days/week.  No prior abdominal surgeries.  He is not on blood thinners.   ROS: Review of Systems  Constitutional: Negative for chills and fever.  Respiratory: Negative for cough and shortness of breath.   Cardiovascular: Negative for chest pain.  Gastrointestinal: Positive for abdominal pain, constipation and nausea. Negative for blood in stool, diarrhea and vomiting.  Genitourinary: Negative for dysuria.  Musculoskeletal: Positive for back pain, falls and joint pain.  Psychiatric/Behavioral: Negative for substance abuse.  All other systems reviewed and are negative.   Family History  Problem Relation Age of Onset   Diabetes Mother        Living   Sleep apnea Mother    Diabetes Father 51       Deceased   Hypertension Father    Jaundice Father    Hemophilia Father    Alcoholism Father    Cancer Maternal Grandfather        Throat   Esophageal cancer Maternal Grandfather    Cancer Paternal Uncle        Throat   Esophageal cancer Paternal Uncle    Asthma Sister    Diabetes Sister    Healthy Son        X1   Healthy Daughter        X1   Colon polyps Neg Hx    Colon cancer Neg  Hx    Rectal cancer Neg Hx    Stomach cancer Neg Hx     Past Medical History:  Diagnosis Date   Allergy    Anxiety    Asthma    SEASONAL    Blood transfusion without reported diagnosis    Chronic back pain    Neurostimulator   Depression    Environmental allergies    Hypertension    Neuromuscular disorder ([place])    Neuropathy    Seasonal allergic conjunctivitis     Past Surgical History:  Procedure Laterality Date   BACK SURGERY     CARDIAC CATHETERIZATION     11/05/10 [facility], [place]): [place] of mLAD, no sign disease. EF 45% in RAO, 60% LAO (51% stress, 54% rest by NM stress; 57-59% echo 10/2010)   CARPAL TUNNEL  RELEASE     Bilateral   CERVICAL DISCECTOMY  12/17/2017   titanium plates 12-17-17   COLONOSCOPY     HAND AMPUTATION  2007   POLYPECTOMY     pt states had colon in [place] and he had polyps- he has no idea what type    ROTATOR CUFF REPAIR     Left   SPINAL CORD STIMULATOR IMPLANT     SPINAL CORD STIMULATOR REMOVAL N/A 10/26/2015   Procedure: LUMBAR SPINAL CORD STIMULATOR REMOVAL;  Surgeon: Clydell Hakim, MD;  Location: [facility];  Service: Neurosurgery;  Laterality: N/A;    Social History:  reports that he has never smoked. He has never used smokeless tobacco. He reports current alcohol use. He reports that he does not use drugs. 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  He is 15+ years clean Drinks 3-5x/week  Allergies:  Allergies  Allergen Reactions   Aspirin Swelling   Hydrocodone Itching    Medications Prior to Admission  Medication Sig Dispense Refill   ALPRAZolam (XANAX) 0.25 MG tablet Take 1 tablet (0.25 mg total) by mouth 2 (two) times daily as needed for anxiety. (Patient taking differently: Take 0.25 mg by mouth daily as needed for anxiety. ) 30 tablet 0   atorvastatin (LIPITOR) 10 MG tablet TAKE 1 TABLET(10 MG) BY MOUTH DAILY (Patient taking differently: Take 10 mg by mouth daily. ) 90 tablet 1   cyclobenzaprine (FLEXERIL) 10 MG tablet Take 1 tablet (10 mg total) by mouth at bedtime. (Patient taking differently: Take 10 mg by mouth daily as needed for muscle spasms. ) 30 tablet 0   gabapentin (NEURONTIN) 300 MG capsule Take 1 capsule each morning and midday along with your 600 mg dose to make a total of 900 mg (Patient taking differently: Take 900 mg by mouth in the morning and at bedtime. ) 60 capsule 3   gabapentin (NEURONTIN) 600 MG tablet Take 1 tablet (600 mg total) by mouth 3 (three) times daily. (Patient taking differently: Take 600 mg by mouth daily. In the afternoon.) 270  tablet 1   ipratropium-albuterol (DUONEB) 0.5-2.5 (3) MG/3ML SOLN Take 3 mLs by nebulization 3 (three) times daily. 360 mL 0   lisinopril (ZESTRIL) 20 MG tablet TAKE 1 TABLET(20 MG) BY MOUTH DAILY (Patient taking differently: Take 20 mg by mouth daily. ) 90 tablet 1   montelukast (SINGULAIR) 10 MG tablet Take 10 mg by mouth daily.      oxyCODONE-acetaminophen (PERCOCET) 10-325 MG tablet TK 1 T PO Q 6 H PRN (Patient taking differently: Take 1 tablet by mouth every  6 (six) hours as needed. ) 120 tablet 0   sertraline (ZOLOFT) 50 MG tablet Take 1 tablet (50 mg total) by mouth daily. 90 tablet 1   tiZANidine (ZANAFLEX) 2 MG tablet Take 2 mg by mouth daily as needed for muscle spasms.   1   docusate sodium (COLACE) 100 MG capsule Take 1 capsule (100 mg total) by mouth 2 (two) times daily. (Patient not taking: Reported on 02/15/2020) 60 capsule 11   zinc sulfate 220 (50 Zn) MG capsule Take 1 capsule (220 mg total) by mouth daily. (Patient not taking: Reported on 02/15/2020) 30 capsule 0     Physical Exam: Blood pressure 136/71, pulse 81, temperature 98.3 F (36.8 C), temperature source Oral, resp. rate 17, height 5' 7" (1.702 m), weight 113.4 kg, SpO2 96 %. General: pleasant, WD/WN white male who is laying in bed in NAD HEENT: head is normocephalic, atraumatic.  Sclera are noninjected.  PERRL.  Ears and nose without any masses or lesions.  Mouth is pink and moist. Dentition fair Heart: regular, rate, and rhythm.  Normal s1,s2. No obvious murmurs, gallops, or rubs noted.  Palpable pedal pulses bilaterally  Lungs: CTAB, no wheezes, rhonchi, or rales noted.  Respiratory effort nonlabored Abd: Soft, distended, tenderness of the epigastrium and right upper quadrant extending to the right flank.  Negative Murphy sign.  No other areas of abdominal tenderness. +BS, no masses, hernias, or organomegaly. 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  Splint to left lower extremity. No RLE edema. Right calf soft  and nontender Skin: warm and dry with no masses, lesions, or rashes Psych: A&Ox4 with an appropriate affect Neuro: cranial nerves grossly intact, equal strength in BUE/BLE bilaterally, normal speech, though process intact   Results for orders placed or performed during the hospital encounter of 02/15/20 (from the past 48 hour(s))  CBC with Differential     Status: Abnormal   Collection Time: 02/15/20  4:54 PM  Result Value Ref Range   WBC 5.4 4.0 - 10.5 K/uL   RBC 4.71 4.22 - 5.81 MIL/uL   Hemoglobin 16.1 13.0 - 17.0 g/dL   HCT 46.4 39.0 - 52.0 %   MCV 98.5 80.0 - 100.0 fL   MCH 34.2 (H) 26.0 - 34.0 pg   MCHC 34.7 30.0 - 36.0 g/dL   RDW 12.4 11.5 - 15.5 %   Platelets 183 150 - 400 K/uL   nRBC 0.0 0.0 - 0.2 %   Neutrophils Relative % 54 %   Neutro Abs 2.9 1.7 - 7.7 K/uL   Lymphocytes Relative 34 %   Lymphs Abs 1.8 0.7 - 4.0 K/uL   Monocytes Relative 10 %   Monocytes Absolute 0.5 0.1 - 1.0 K/uL   Eosinophils Relative 1 %   Eosinophils Absolute 0.1 0.0 - 0.5 K/uL   Basophils Relative 1 %   Basophils Absolute 0.0 0.0 - 0.1 K/uL   Immature Granulocytes 0 %   Abs Immature Granulocytes 0.02 0.00 - 0.07 K/uL    Comment: Performed at [facility], [address], [place], [place] [address]  Comprehensive metabolic panel     Status: Abnormal   Collection Time: 02/15/20  4:54 PM  Result Value Ref Range   Sodium 139 135 - 145 mmol/L   Potassium 4.1 3.5 - 5.1 mmol/L   Chloride 104 98 - 111 mmol/L   CO2 19 (L) 22 - 32 mmol/L   Glucose, Bld 104 (H) 70 - 99 mg/dL    Comment: Glucose  reference range applies only to samples taken after fasting for at least 8 hours.   BUN 10 6 - 20 mg/dL   Creatinine, Ser 0.95 0.61 - 1.24 mg/dL   Calcium 8.8 (L) 8.9 - 10.3 mg/dL   Total Protein 7.0 6.5 - 8.1 g/dL   Albumin 3.9 3.5 - 5.0 g/dL   AST 72 (H) 15 - 41 U/L   ALT 61 (H) 0 - 44 U/L   Alkaline Phosphatase 74 38 - 126 U/L   Total Bilirubin 0.8 0.3 - 1.2 mg/dL   GFR calc non Af Amer >60 >60  mL/min   GFR calc Af Amer >60 >60 mL/min   Anion gap 16 (H) 5 - 15    Comment: Performed at [facility] [address] [place], [place] [address]  Ethanol     Status: Abnormal   Collection Time: 02/15/20  4:54 PM  Result Value Ref Range   Alcohol, Ethyl (B) 193 (H) <10 mg/dL    Comment: (NOTE) Lowest detectable limit for serum alcohol is 10 mg/dL. For medical purposes only. Performed at [facility], [place] [address] [place], [place] [address]   SARS Coronavirus 2 by RT PCR     Status: None   Collection Time: 02/16/20  4:37 AM  Result Value Ref Range   SARS Coronavirus 2 NEGATIVE NEGATIVE    Comment: (NOTE) Result indicates the ABSENCE of SARS-CoV-2 RNA in the patient specimen.  The lowest concentration of SARS-CoV-2 viral copies this assay can detect in nasopharyngeal swab specimens is 500 copies / mL.  A negative result does not preclude SARS-CoV-2 infection and should not be used as the sole basis for patient management decisions. A negative result may occur with improper specimen collection / handling, submission of a specimen other than nasopharyngeal swab, presence of viral mutation(s) within the areas targeted by this assay, and inadequate number of viral copies (<500 copies / mL) present.  Negative results must be combined with clinical observations, patient history, and epidemiological information.  The expected result is NEGATIVE.  Patient Fact Sheet:  BlogSelections.co.uk   Provider Fact Sheet:  https://lucas.com/   This test is not yet approved or cleared by the [place] FDA and  has been authorized for  detection and/or diagnosis of SARS-CoV-2 by FDA under an Emergency Use Authorization (EUA).  This EUA will remain in effect (meaning this test can be used) for the duration of  the COVID-19 declaration under Section 564(b)(1) of the Act, 21 U.S.C. section 360bbb-3(b)(1), unless the authorization  is terminated or revoked sooner Performed at [facility], [place] [address] [place], [place] [address]   CBC     Status: Abnormal   Collection Time: 02/16/20  5:08 AM  Result Value Ref Range   WBC 9.6 4.0 - 10.5 K/uL   RBC 4.23 4.22 - 5.81 MIL/uL   Hemoglobin 14.5 13.0 - 17.0 g/dL   HCT 42.7 39.0 - 52.0 %   MCV 100.9 (H) 80.0 - 100.0 fL   MCH 34.3 (H) 26.0 - 34.0 pg   MCHC 34.0 30.0 - 36.0 g/dL   RDW 12.8 11.5 - 15.5 %   Platelets 160 150 - 400 K/uL   nRBC 0.0 0.0 - 0.2 %    Comment: Performed at [facility], [place] [address] [place], [place] [address]  Comprehensive metabolic panel     Status: Abnormal   Collection Time: 02/16/20  5:08 AM  Result Value Ref Range  Sodium 136 135 - 145 mmol/L   Potassium 4.1 3.5 - 5.1 mmol/L   Chloride 103 98 - 111 mmol/L   CO2 22 22 - 32 mmol/L   Glucose, Bld 117 (H) 70 - 99 mg/dL    Comment: Glucose reference range applies only to samples taken after fasting for at least 8 hours.   BUN 9 6 - 20 mg/dL   Creatinine, Ser 0.95 0.61 - 1.24 mg/dL   Calcium 8.2 (L) 8.9 - 10.3 mg/dL   Total Protein 6.6 6.5 - 8.1 g/dL   Albumin 4.0 3.5 - 5.0 g/dL   AST 61 (H) 15 - 41 U/L   ALT 59 (H) 0 - 44 U/L   Alkaline Phosphatase 72 38 - 126 U/L   Total Bilirubin 0.8 0.3 - 1.2 mg/dL   GFR calc non Af Amer >60 >60 mL/min   GFR calc Af Amer >60 >60 mL/min   Anion gap 11 5 - 15    Comment: Performed at [facility] [address], [place], [place] [address]  Magnesium     Status: None   Collection Time: 02/16/20  5:08 AM  Result Value Ref Range   Magnesium 1.9 1.7 - 2.4 mg/dL    Comment: Performed at [facility] [address] [place], [place] [address]  Phosphorus     Status: None   Collection Time: 02/16/20  5:08 AM  Result Value Ref Range   Phosphorus 3.0 2.5 - 4.6 mg/dL    Comment: Performed at [facility] [address] [place], [place] [address]  Glucose, capillary     Status: Abnormal   Collection Time: 02/16/20   6:46 AM  Result Value Ref Range   Glucose-Capillary 112 (H) 70 - 99 mg/dL    Comment: Glucose reference range applies only to samples taken after fasting for at least 8 hours.  Glucose, capillary     Status: Abnormal   Collection Time: 02/16/20 11:39 AM  Result Value Ref Range   Glucose-Capillary 101 (H) 70 - 99 mg/dL    Comment: Glucose reference range applies only to samples taken after fasting for at least 8 hours.   CT Head Wo Contrast  Result Date: 02/15/2020 CLINICAL DATA:  Head trauma, slipped on mirror and fell backwards EXAM: CT HEAD WITHOUT CONTRAST TECHNIQUE: Contiguous axial images were obtained from the base of the skull through the vertex without intravenous contrast. COMPARISON:  None. FINDINGS: Brain: No evidence of acute territorial infarction, hemorrhage, hydrocephalus,extra-axial collection or mass lesion/mass effect. Normal gray-white differentiation. Ventricles are normal in size and contour. Vascular: No hyperdense vessel or unexpected calcification. Skull: The skull is intact. No fracture or focal lesion identified. Sinuses/Orbits: The visualized paranasal sinuses and mastoid air cells are clear. The orbits and globes intact. Other: None Cervical spine: Alignment: Physiologic Skull base and vertebrae: Visualized skull base is intact. No atlanto-occipital dissociation. The vertebral body heights are well maintained. The patient is status post ACDF at C6-C7. There is solid interbody fusion seen across the disc space. No periprosthetic lucency or hardware fracture seen. No fracture or pathologic osseous lesion seen. Soft tissues and spinal canal: The visualized paraspinal soft tissues are unremarkable. No prevertebral soft tissue swelling is seen. The spinal canal is grossly unremarkable, no large epidural collection or significant canal narrowing. Disc levels: No significant canal or neural foraminal narrowing is noted. Upper chest: Mild biapical ground-glass opacity, likely  atelectasis. Thoracic inlet is within normal limits. Other: None IMPRESSION: 1.  No acute intracranial abnormality. 2.  No acute fracture or malalignment of the spine. 3. Status post ACDF at C6-C7 without hardware complication. Electronically Signed   By: Prudencio Pair M.D.   On: 02/15/2020 19:29   CT Cervical Spine Wo Contrast  Result Date: 02/15/2020 CLINICAL DATA:  Head trauma, slipped on mirror and fell backwards EXAM: CT HEAD WITHOUT CONTRAST TECHNIQUE: Contiguous axial images were obtained from the base of the skull through the vertex without intravenous contrast. COMPARISON:  None. FINDINGS: Brain: No evidence of acute territorial infarction, hemorrhage, hydrocephalus,extra-axial collection or mass lesion/mass effect. Normal gray-white differentiation. Ventricles are normal in size and contour. Vascular: No hyperdense vessel or unexpected calcification. Skull: The skull is intact. No fracture or focal lesion identified. Sinuses/Orbits: The visualized paranasal sinuses and mastoid air cells are clear. The orbits and globes intact. Other: None Cervical spine: Alignment: Physiologic Skull base and vertebrae: Visualized skull base is intact. No atlanto-occipital dissociation. The vertebral body heights are well maintained. The patient is status post ACDF at C6-C7. There is solid interbody fusion seen across the disc space. No periprosthetic lucency or hardware fracture seen. No fracture or pathologic osseous lesion seen. Soft tissues and spinal canal: The visualized paraspinal soft tissues are unremarkable. No prevertebral soft tissue swelling is seen. The spinal canal is grossly unremarkable, no large epidural collection or significant canal narrowing. Disc levels: No significant canal or neural foraminal narrowing is noted. Upper chest: Mild biapical ground-glass opacity, likely atelectasis. Thoracic inlet is within normal limits. Other: None IMPRESSION: 1.  No acute intracranial abnormality. 2.  No acute  fracture or malalignment of the spine. 3. Status post ACDF at C6-C7 without hardware complication. Electronically Signed   By: Prudencio Pair M.D.   On: 02/15/2020 19:29   CT Lumbar Spine Wo Contrast  Result Date: 02/15/2020 CLINICAL DATA:  54 year old male status post slip and fall backwards. Low back pain. EXAM: CT LUMBAR SPINE WITHOUT CONTRAST TECHNIQUE: Multidetector CT imaging of the lumbar spine was performed without intravenous contrast administration. Multiplanar CT image reconstructions were also generated. COMPARISON:  Lumbar spine CT 07/30/2015. FINDINGS: Segmentation: Mildly transitional anatomy, same numbering system used as in 2016 designating the lowest open disc space L5-S1, but there are hypoplastic ribs at L1 (more so the right series 5, image 27). Correlation with radiographs is recommended prior to any operative intervention. Alignment: Stable straightening of lumbar lordosis. No spondylolisthesis. Vertebrae: T12 and lumbar vertebrae appear intact. Intact visible sacrum and SI joints. Paraspinal and other soft tissues: Partially visible fatty pancreatic atrophy. Otherwise negative visible noncontrast abdominal and pelvic viscera. Paraspinal soft tissues appear within normal limits, a previously-seen thoracic spinal stimulator device has been removed. Disc levels: T11-T12: Moderate right side facet hypertrophy with moderate to severe right T11 foraminal stenosis (series 5, image 1). T12-L1:  Negative. L1-L2:  Negative. L2-L3: Minimal disc bulge and mild facet hypertrophy appears stable since 2016. No convincing stenosis. L3-L4: Mild circumferential disc bulge and facet hypertrophy appears stable since 2016 with up to mild spinal and bilateral L3 foraminal stenosis. L4-L5: Mild circumferential disc bulge and facet hypertrophy. No convincing spinal stenosis. Mild L4 foraminal stenosis appears stable and in part related to chronic endplate spurring. L5-S1: Interval postoperative changes to the left  lamina (series 4 image 103). Suspect architectural distortion at the left lateral recess. Mild facet hypertrophy. Residual disc bulge and endplate spurring. No convincing spinal stenosis. Mild bilateral L5 foraminal stenosis appears stable. IMPRESSION: 1. No acute traumatic injury identified in the lumbar spine.  2. Mildly transitional anatomy, same numbering system used on a 2016 lumbar CT. 3. Thoracic spinal stimulator device removed since 2016. And interval postoperative changes to the left lamina at L5-S1. 4. 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 And up to mild L3 through L5 foraminal stenosis appears stable. 5. Moderate to severe right T11 neural foraminal stenosis related to facet degeneration. Electronically Signed   By: Genevie Ann M.D.   On: 02/15/2020 19:36   CT Ankle Left Wo Contrast  Result Date: 02/15/2020 CLINICAL DATA:  Ankle fracture after fall, post reduction EXAM: CT OF THE LEFT ANKLE WITHOUT CONTRAST TECHNIQUE: Multidetector CT imaging of the left ankle was performed according to the standard protocol. Multiplanar CT image reconstructions were also generated. COMPARISON:  Radiograph same day FINDINGS: Bones/Joint/Cartilage There is tiny osseous chip fractures seen off the medial malleolus. There remains slight widening of the medial malleolus measuring up to 5 mm. Comminuted intra-articular mildly displaced fracture seen of the posterior malleolus. There tiny osseous fragment seen at the anterior tibiotalar joint. A mildly displaced obliquely oriented fracture seen of the distal fibula at the level of the ankle mortise with small surrounding fracture fragments within this seen is most CIS. No other definite fractures are identified. A small os navicular is present. Ligaments Suboptimally assessed by CT. Muscles and Tendons The muscles surrounding the ankle appear to be intact. The flexor and extensor tendons are intact. The Achilles tendon is intact. Soft tissues Diffuse soft tissue  swelling is seen around the ankle, predominantly around the medial malleolus. There is a small ankle joint effusion present. Heel pad inflammation is present. IMPRESSION: 1. The patient is status post reduction of the trimalleolar fracture as described above. There remains slight widening of the medial clear space however. 2. Tiny osseous fragment seen at the medial anterior talar dome which could represent a small chip fracture. 3. Small ankle joint effusion and significant surrounding soft tissue edema. Electronically Signed   By: Prudencio Pair M.D.   On: 02/15/2020 19:36   DG Ankle Left Port  Result Date: 02/15/2020 CLINICAL DATA:  Post reduction EXAM: PORTABLE LEFT ANKLE - 2 VIEW COMPARISON:  Radiograph same day FINDINGS: There is been interval reduction of the fracture dislocation of the ankle. The ankle mortise appears to be in near anatomic alignment. There remains a obliquely oriented mildly displaced distal fibular fracture. IMPRESSION: Status post reduction of ankle fracture dislocation in near anatomic alignment. Electronically Signed   By: Prudencio Pair M.D.   On: 02/15/2020 18:23   DG Ankle Left Port  Result Date: 02/15/2020 CLINICAL DATA:  Recent fall with ankle deformity, initial encounter EXAM: PORTABLE LEFT ANKLE - 2 VIEW COMPARISON:  None. FINDINGS: There is posterolateral dislocation of the talus with respect to the distal tibia. Oblique fracture through the distal fibula is noted. Some regularity in the region of the posterior malleolus is seen likely related to undisplaced fracture. IMPRESSION: Distal fibular fracture with posterolateral dislocation of the talus with respect to the distal tibia. There is likely a posterior malleolar fracture present as well. Electronically Signed   By: Inez Catalina M.D.   On: 02/15/2020 17:28   Anti-infectives (From admission, onward)   None      Assessment/Plan HTN Chronic back pain Asthma  Alcohol use   Left trimalleolar  fracture  Cholelithiasis RUQ abdominal pain Fall -Patient's history appears to be consistent with biliary colic.  He has been having symptoms for the last 10-11 days.  He had work-up in  the ED on 4/5 that showed sludge/stones without evidence of acute cholecystitis.  He denies trauma to his abdomen after the fall yesterday.  His pain is unchanged since after the fall.  Currently he is afebrile and without a leukocytosis.  AST/ALT very mildly elevated which could be due to patient's chronic hepatic steatosis noted on prior CT imaging as well as his chronic alcohol use.  Will obtain a CT scan to evaluate GB and rule out any traumatic findings.  Will make NPO, obtain lipase and trend WBC/LFTs.  I suspect if CT is negative for traumatic findings and does not show signs of acute cholecystitis, patient could have a trial of dietary changes, pain control, and follow-up in the office. We will follow along.   FEN - NPO VTE - Per TRH.  Okay for chemical prophylaxis from general surgery standpoint ID - None indicated from a general surgery standpoint at this time  Jillyn Ledger, [facility] 02/16/2020, 1:24 PM Please see Amion for pager number during day hours 7:00am-4:30pm

## 2020-02-16 NOTE — Progress Notes (Signed)
Dr Dwyane Dee called for changed in diet order- now to Full liquids.  The patient is aware

## 2020-02-17 DIAGNOSIS — S82842A Displaced bimalleolar fracture of left lower leg, initial encounter for closed fracture: Secondary | ICD-10-CM | POA: Diagnosis not present

## 2020-02-17 LAB — CBC
HCT: 41.9 % (ref 39.0–52.0)
Hemoglobin: 13.9 g/dL (ref 13.0–17.0)
MCH: 34.4 pg — ABNORMAL HIGH (ref 26.0–34.0)
MCHC: 33.2 g/dL (ref 30.0–36.0)
MCV: 103.7 fL — ABNORMAL HIGH (ref 80.0–100.0)
Platelets: 146 10*3/uL — ABNORMAL LOW (ref 150–400)
RBC: 4.04 MIL/uL — ABNORMAL LOW (ref 4.22–5.81)
RDW: 12.7 % (ref 11.5–15.5)
WBC: 7.1 10*3/uL (ref 4.0–10.5)
nRBC: 0 % (ref 0.0–0.2)

## 2020-02-17 LAB — COMPREHENSIVE METABOLIC PANEL
ALT: 46 U/L — ABNORMAL HIGH (ref 0–44)
AST: 35 U/L (ref 15–41)
Albumin: 3.6 g/dL (ref 3.5–5.0)
Alkaline Phosphatase: 59 U/L (ref 38–126)
Anion gap: 11 (ref 5–15)
BUN: 9 mg/dL (ref 6–20)
CO2: 25 mmol/L (ref 22–32)
Calcium: 8.7 mg/dL — ABNORMAL LOW (ref 8.9–10.3)
Chloride: 99 mmol/L (ref 98–111)
Creatinine, Ser: 1.09 mg/dL (ref 0.61–1.24)
GFR calc Af Amer: >60 mL/min (ref >60)
GFR calc non Af Amer: >60 mL/min (ref >60)
Glucose, Bld: 116 mg/dL — ABNORMAL HIGH (ref 70–99)
Potassium: 4.2 mmol/L (ref 3.5–5.1)
Sodium: 135 mmol/L (ref 135–145)
Total Bilirubin: 0.9 mg/dL (ref 0.3–1.2)
Total Protein: 6.3 g/dL — ABNORMAL LOW (ref 6.5–8.1)

## 2020-02-17 NOTE — Care Management Obs Status (Signed)
[place] NOTIFICATION   Patient Details  Name: Sonya Gim MRN: [id number] Date of Birth: [birth year]   Medicare Observation Status Notification Given:  Yes    Sharin Mons, RN 02/17/2020, 9:25 AM

## 2020-02-17 NOTE — Plan of Care (Signed)

## 2020-02-17 NOTE — Plan of Care (Signed)

## 2020-02-17 NOTE — Care Management CC44 (Signed)
Condition Code 44 Documentation Completed  Patient Details  Name: Daniel Durham MRN: [id number] Date of Birth: Mar 25, 1966   Condition Code 44 given:  Yes Patient signature on Condition Code 44 notice:  Yes Documentation of 2 MD's agreement:  Yes Code 44 added to claim:  Yes    Sharin Mons, RN 02/17/2020, 9:26 AM

## 2020-02-17 NOTE — Evaluation (Signed)
Physical Therapy Evaluation Patient Details Name: Daniel Durham MRN: [id number] DOB: [birth year] Today's Date: 02/17/2020   History of Present Illness  Pt is a 54 y/o male admitted secondary to L ankle fx s/p reduction. PMH includes alcohol use, COVID, HTN, and Traumatic L forearm amputation.   Clinical Impression  Pt admitted secondary to problem above with deficits below. Pt with increased pain in LLE. Pt requiring min to mod A for bed mobility and scooting up towards [place]. Pt with increased difficulty performing lateral scoots. 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 Feel pt is at increased risk for falls and pt's wife unable to provide 24/7 support. Feel pt will require SNF level therapies at d/c to increase independence and safety with mobility. Will continue to follow acutely to maximize functional mobility independence and safety.     Follow Up Recommendations SNF;Supervision/Assistance - 24 hour    Equipment Recommendations  Wheelchair (measurements PT);Wheelchair cushion (measurements PT)    Recommendations for Other Services       Precautions / Restrictions Precautions Precautions: Fall Restrictions Weight Bearing Restrictions: Yes LLE Weight Bearing: Non weight bearing Other Position/Activity Restrictions: L forearm amputation at baseline      Mobility  Bed Mobility Overal bed mobility: Needs Assistance Bed Mobility: Supine to Sit;Sit to Supine     Supine to sit: Mod assist Sit to supine: Min assist   General bed mobility comments: Mod A for trunk elevation and LLE assist. Min A for LLE assist for return to supine.   Transfers Overall transfer level: Needs assistance   Transfers: Lateral/Scoot Transfers          Lateral/Scoot Transfers: Min assist General transfer comment: Performed lateral scooting on EOB. Pt with increased difficulty and unsteadiness requiring min a for steadying assist. Pt with increased pain. Required multiple scoots to make it up to  head of bed.   Ambulation/Gait                Stairs            Wheelchair Mobility    Modified Rankin (Stroke Patients Only)       Balance Overall balance assessment: Needs assistance Sitting-balance support: Single extremity supported;Feet supported Sitting balance-Leahy Scale: Poor Sitting balance - Comments: Reliant on 1 UE support                                      Pertinent Vitals/Pain Pain Assessment: 0-10 Pain Score: 8  Pain Location: L ankle  Pain Descriptors / Indicators: Sharp Pain Intervention(s): Monitored during session;Limited activity within patient's tolerance;Repositioned    Home Living Family/patient expects to be discharged to:: Private residence Living Arrangements: Spouse/significant other Available Help at Discharge: Family;Available PRN/intermittently Type of Home: House Home Access: Ramped entrance     Home Layout: One level Home Equipment: Transport planner Additional Comments: Pt's wife works 8 hours/day    Prior Function Level of Independence: Independent               Journalist, newspaper        Extremity/Trunk Assessment   Upper Extremity Assessment Upper Extremity Assessment: LUE deficits/detail;Defer to OT evaluation LUE Deficits / Details: L forearm amputation at baseline     Lower Extremity Assessment Lower Extremity Assessment: LLE deficits/detail LLE Deficits / Details: L ankle splinted. Functional weakness noted, and had difficulty lifting to perform bed mobility.     Cervical / Trunk Assessment  Cervical / Trunk Assessment: Normal  Communication   Communication: No difficulties  Cognition Arousal/Alertness: Awake/alert Behavior During Therapy: WFL for tasks assessed/performed Overall Cognitive Status: Impaired/Different from baseline Area of Impairment: Safety/judgement                         Safety/Judgement: Decreased awareness of deficits     General Comments: Pt  talking about walking a few times during session and had to remind pt he could not put weight on LLE. Also has L forearm amputation, making it difficult to use AD for transfers.       General Comments      Exercises     Assessment/Plan    PT Assessment Patient needs continued PT services  PT Problem List Decreased strength;Decreased balance;Decreased mobility;Decreased activity tolerance;Decreased knowledge of use of DME;Decreased knowledge of precautions;Pain       PT Treatment Interventions Functional mobility training;DME instruction;Therapeutic activities;Therapeutic exercise;Balance training;Patient/family education    PT Goals (Current goals can be found in the Care Plan section)  Acute Rehab PT Goals Patient Stated Goal: to be more independent before going home PT Goal Formulation: With patient/family Time For Goal Achievement: 03/02/20 Potential to Achieve Goals: Fair    Frequency Min 3X/week   Barriers to discharge Decreased caregiver support      Co-evaluation               AM-PAC PT "6 Clicks" Mobility  Outcome Measure Help needed turning from your back to your side while in a flat bed without using bedrails?: A Little Help needed moving from lying on your back to sitting on the side of a flat bed without using bedrails?: A Lot Help needed moving to and from a bed to a chair (including a wheelchair)?: A Lot Help needed standing up from a chair using your arms (e.g., wheelchair or bedside chair)?: Total Help needed to walk in hospital room?: Total Help needed climbing 3-5 steps with a railing? : Total 6 Click Score: 10    End of Session   Activity Tolerance: Patient limited by pain Patient left: in bed;with call bell/phone within reach;with bed alarm set Nurse Communication: Mobility status PT Visit Diagnosis: Difficulty in walking, not elsewhere classified (R26.2);Pain Pain - Right/Left: Left Pain - part of body: Ankle and joints of foot    Time:  [id number] PT Time Calculation (min) (ACUTE ONLY): 23 min   Charges:   PT Evaluation $PT Eval Moderate Complexity: 1 Mod PT Treatments $Therapeutic Activity: 8-22 mins        Lou Miner, DPT  Acute Rehabilitation Services  Pager: [phone] Office: [phone]   Rudean Hitt 02/17/2020, 4:53 PM

## 2020-02-17 NOTE — Plan of Care (Signed)

## 2020-02-17 NOTE — Progress Notes (Signed)
PROGRESS NOTE    Daniel Durham  [id number] DOB: 1966/05/03 DOA: 02/15/2020 PCP: Brunetta Jeans, PA-C   Brief Narrative:  Daniel Durham is a 54 y.o. male with history of hypertension, chronic back pain asthma was brought to the ER after patient had a fall.  Patient states he slipped and fell today at his head and lost consciousness briefly.  He hurt his left ankle and was brought to the ER.  Denies any chest pain or shortness of breath.  Has been worked up for his gallstones and has follow-up with surgery next week.  He drinks alcohol every day.  ED Course: In the ER patient had x-ray showed left ankle fracture for which Dr. Stann Mainland orthopedic surgeon was consulted and requested follow-up with orthopedics in a week for further management since patient's swelling is to decrease before surgery.  This patient has significant pain and has left upper forearm amputation can find it difficult to walk with crutches and will need further help.  Patient has significant pain and difficulty ambulate admitted for further observation.  Labs are largely unremarkable except for most some macrocytosis.  Covid test was negative.  CT head C-spine and L-spine were unremarkable.  Assessment & Plan:   Principal Problem:   Ankle fracture, bimalleolar, closed, left, initial encounter Active Problems:   HTN (hypertension)   Closed left ankle fracture  1. Left ankle fracture for which patient will need follow-up with Dr. Stann Mainland orthopedic surgeon as outpatient for further management but since patient has significant pain and difficulty ambulating patient admitted for observation we will get physical therapy consult.     Adequate pain control , Ortho suggests early as they can do operation on patient's ankle is next week.  There is a significant soft tissue swelling that needs to be resolved.  2. Hypertension on lisinopril. 3. History of alcohol abuse on CIWA protocol. 4. History of asthma presently not  wheezing. 5. Chronic pain on oxycodone Flexeril and gabapentin. 6. Right upper quadrant abdominal pain : Surgery consulted will follow up recommendation history of gallstones for which patient has follow-up with general surgery.  General surgery suggest outpatient follow-up.   DVT prophylaxis: Lovenox. Code Status: Full code. Family Communication: Patient's wife. Disposition Plan:  Skilled nursing facility.  It is unsafe for the patient to be discharged home given amputated left arm, Barriers: High risk for falls, not able to use crutches. Consults called: ER physician discussed with Dr. Stann Mainland.  Physical therapy. Admission status: Inpatient   Consultants:   General surgery  Orthopedics  Procedures:  Antimicrobials:  Anti-infectives (From admission, onward)   None      Subjective: Patient was seen and examined at bedside.  He reports abdominal pain is better, asking about the strong pain medication , states pain is not controlled with oxycodone. Objective: Vitals:   02/17/20 0334 02/17/20 0907 02/17/20 1100 02/17/20 1416  BP: 129/69 125/69 125/69 118/68  Pulse: 72 81  69  Resp: 15 17  17   Temp: 98.9 F (37.2 C) 99.1 F (37.3 C)  98.6 F (37 C)  TempSrc: Oral Oral  Oral  SpO2: 91% 91%  93%  Weight:      Height:        Intake/Output Summary (Last 24 hours) at 02/17/2020 1507 Last data filed at 02/17/2020 0400 Gross per 24 hour  Intake 480 ml  Output 300 ml  Net 180 ml   Filed Weights   02/15/20 1654  Weight: 113.4 kg    Examination:  General exam: Appears calm and comfortable  Respiratory system: Clear to auscultation. Respiratory effort normal. Cardiovascular system: S1 & S2 heard, RRR. No JVD, murmurs, rubs, gallops or clicks. No pedal edema. Gastrointestinal system: Abdomen is nondistended, tenderness noted in RUQ,. No organomegaly or masses felt. Normal bowel sounds heard. Central nervous system: Alert and oriented. No focal neurological  deficits. Extremities: Symmetric 5 x 5 power. Skin: No rashes, lesions or ulcers Psychiatry: Judgement and insight appear normal. Mood & affect appropriate.     Data Reviewed: I have personally reviewed following labs and imaging studies  CBC: Recent Labs  Lab 02/15/20 1654 02/16/20 0508 02/17/20 0505  WBC 5.4 9.6 7.1  NEUTROABS 2.9  --   --   HGB 16.1 14.5 13.9  HCT 46.4 42.7 41.9  MCV 98.5 100.9* 103.7*  PLT 183 160 [id number]*   Basic Metabolic Panel: Recent Labs  Lab 02/15/20 1654 02/16/20 0508 02/17/20 0505  NA 139 136 135  K 4.1 4.1 4.2  CL 104 103 99  CO2 19* 22 25  GLUCOSE 104* 117* 116*  BUN 10 9 9   CREATININE 0.95 0.95 1.09  CALCIUM 8.8* 8.2* 8.7*  MG  --  1.9  --   PHOS  --  3.0  --    GFR: Estimated Creatinine Clearance: 95.3 mL/min (by C-G formula based on SCr of 1.09 mg/dL). Liver Function Tests: Recent Labs  Lab 02/15/20 1654 02/16/20 0508 02/17/20 0505  AST 72* 61* 35  ALT 61* 59* 46*  ALKPHOS 74 72 59  BILITOT 0.8 0.8 0.9  PROT 7.0 6.6 6.3*  ALBUMIN 3.9 4.0 3.6   Recent Labs  Lab 02/16/20 1414  LIPASE 23   No results for input(s): AMMONIA in the last 168 hours. Coagulation Profile: No results for input(s): INR, PROTIME in the last 168 hours. Cardiac Enzymes: No results for input(s): CKTOTAL, CKMB, CKMBINDEX, TROPONINI in the last 168 hours. BNP (last 3 results) No results for input(s): PROBNP in the last 8760 hours. HbA1C: No results for input(s): HGBA1C in the last 72 hours. CBG: Recent Labs  Lab 02/16/20 0646 02/16/20 1139 02/16/20 1646  GLUCAP 112* 101* 96   Lipid Profile: No results for input(s): CHOL, HDL, LDLCALC, TRIG, CHOLHDL, LDLDIRECT in the last 72 hours. Thyroid Function Tests: No results for input(s): TSH, T4TOTAL, FREET4, T3FREE, THYROIDAB in the last 72 hours. Anemia Panel: No results for input(s): VITAMINB12, FOLATE, FERRITIN, TIBC, IRON, RETICCTPCT in the last 72 hours. Sepsis Labs: No results for input(s):  PROCALCITON, LATICACIDVEN in the last 168 hours.  Recent Results (from the past 240 hour(s))  SARS Coronavirus 2 by RT PCR     Status: None   Collection Time: 02/16/20  4:37 AM  Result Value Ref Range Status   SARS Coronavirus 2 NEGATIVE NEGATIVE Final    Comment: (NOTE) Result indicates the ABSENCE of SARS-CoV-2 RNA in the patient specimen.  The lowest concentration of SARS-CoV-2 viral copies this assay can detect in nasopharyngeal swab specimens is 500 copies / mL.  A negative result does not preclude SARS-CoV-2 infection and should not be used as the sole basis for patient management decisions. A negative result may occur with improper specimen collection / handling, submission of a specimen other than nasopharyngeal swab, presence of viral mutation(s) within the areas targeted by this assay, and inadequate number of viral copies (<500 copies / mL) present.  Negative results must be combined with clinical observations, patient history, and epidemiological information.  The expected result is NEGATIVE.  Patient Fact Sheet:  BlogSelections.co.uk   Provider Fact Sheet:  https://lucas.com/   This test is not yet approved or cleared by the [place] FDA and  has been authorized for  detection and/or diagnosis of SARS-CoV-2 by FDA under an Emergency Use Authorization (EUA).  This EUA will remain in effect (meaning this test can be used) for the duration of  the COVID-19 declaration under Section 564(b)(1) of the Act, 21 U.S.C. section 360bbb-3(b)(1), unless the authorization is terminated or revoked sooner Performed at [facility], [place] [address] [place], [place] [address]      Radiology Studies: CT Head Wo Contrast  Result Date: 02/15/2020 CLINICAL DATA:  Head trauma, slipped on mirror and fell backwards EXAM: CT HEAD WITHOUT CONTRAST TECHNIQUE: Contiguous axial images were obtained from the base of the skull through the  vertex without intravenous contrast. COMPARISON:  None. FINDINGS: Brain: No evidence of acute territorial infarction, hemorrhage, hydrocephalus,extra-axial collection or mass lesion/mass effect. Normal gray-white differentiation. Ventricles are normal in size and contour. Vascular: No hyperdense vessel or unexpected calcification. Skull: The skull is intact. No fracture or focal lesion identified. Sinuses/Orbits: The visualized paranasal sinuses and mastoid air cells are clear. The orbits and globes intact. Other: None Cervical spine: Alignment: Physiologic Skull base and vertebrae: Visualized skull base is intact. No atlanto-occipital dissociation. The vertebral body heights are well maintained. The patient is status post ACDF at C6-C7. There is solid interbody fusion seen across the disc space. No periprosthetic lucency or hardware fracture seen. No fracture or pathologic osseous lesion seen. Soft tissues and spinal canal: The visualized paraspinal soft tissues are unremarkable. No prevertebral soft tissue swelling is seen. The spinal canal is grossly unremarkable, no large epidural collection or significant canal narrowing. Disc levels: No significant canal or neural foraminal narrowing is noted. Upper chest: Mild biapical ground-glass opacity, likely atelectasis. Thoracic inlet is within normal limits. Other: None IMPRESSION: 1.  No acute intracranial abnormality. 2.  No acute fracture or malalignment of the spine. 3. Status post ACDF at C6-C7 without hardware complication. Electronically Signed   By: Prudencio Pair M.D.   On: 02/15/2020 19:29   CT Cervical Spine Wo Contrast  Result Date: 02/15/2020 CLINICAL DATA:  Head trauma, slipped on mirror and fell backwards EXAM: CT HEAD WITHOUT CONTRAST TECHNIQUE: Contiguous axial images were obtained from the base of the skull through the vertex without intravenous contrast. COMPARISON:  None. FINDINGS: Brain: No evidence of acute territorial infarction, hemorrhage,  hydrocephalus,extra-axial collection or mass lesion/mass effect. Normal gray-white differentiation. Ventricles are normal in size and contour. Vascular: No hyperdense vessel or unexpected calcification. Skull: The skull is intact. No fracture or focal lesion identified. Sinuses/Orbits: The visualized paranasal sinuses and mastoid air cells are clear. The orbits and globes intact. Other: None Cervical spine: Alignment: Physiologic Skull base and vertebrae: Visualized skull base is intact. No atlanto-occipital dissociation. The vertebral body heights are well maintained. The patient is status post ACDF at C6-C7. There is solid interbody fusion seen across the disc space. No periprosthetic lucency or hardware fracture seen. No fracture or pathologic osseous lesion seen. Soft tissues and spinal canal: The visualized paraspinal soft tissues are unremarkable. No prevertebral soft tissue swelling is seen. The spinal canal is grossly unremarkable, no large epidural collection or significant canal narrowing. Disc levels: No significant canal or neural foraminal narrowing is noted. Upper chest: Mild biapical ground-glass opacity, likely atelectasis. Thoracic inlet is within normal limits. Other: None IMPRESSION: 1.  No acute intracranial abnormality.  2.  No acute fracture or malalignment of the spine. 3. Status post ACDF at C6-C7 without hardware complication. Electronically Signed   By: Prudencio Pair M.D.   On: 02/15/2020 19:29   CT Lumbar Spine Wo Contrast  Result Date: 02/15/2020 CLINICAL DATA:  54 year old male status post slip and fall backwards. Low back pain. EXAM: CT LUMBAR SPINE WITHOUT CONTRAST TECHNIQUE: Multidetector CT imaging of the lumbar spine was performed without intravenous contrast administration. Multiplanar CT image reconstructions were also generated. COMPARISON:  Lumbar spine CT 07/30/2015. FINDINGS: Segmentation: Mildly transitional anatomy, same numbering system used as in 2016 designating the  lowest open disc space L5-S1, but there are hypoplastic ribs at L1 (more so the right series 5, image 27). Correlation with radiographs is recommended prior to any operative intervention. Alignment: Stable straightening of lumbar lordosis. No spondylolisthesis. Vertebrae: T12 and lumbar vertebrae appear intact. Intact visible sacrum and SI joints. Paraspinal and other soft tissues: Partially visible fatty pancreatic atrophy. Otherwise negative visible noncontrast abdominal and pelvic viscera. Paraspinal soft tissues appear within normal limits, a previously-seen thoracic spinal stimulator device has been removed. Disc levels: T11-T12: Moderate right side facet hypertrophy with moderate to severe right T11 foraminal stenosis (series 5, image 1). T12-L1:  Negative. L1-L2:  Negative. L2-L3: Minimal disc bulge and mild facet hypertrophy appears stable since 2016. No convincing stenosis. L3-L4: Mild circumferential disc bulge and facet hypertrophy appears stable since 2016 with up to mild spinal and bilateral L3 foraminal stenosis. L4-L5: Mild circumferential disc bulge and facet hypertrophy. No convincing spinal stenosis. Mild L4 foraminal stenosis appears stable and in part related to chronic endplate spurring. L5-S1: Interval postoperative changes to the left lamina (series 4 image 103). Suspect architectural distortion at the left lateral recess. Mild facet hypertrophy. Residual disc bulge and endplate spurring. No convincing spinal stenosis. Mild bilateral L5 foraminal stenosis appears stable. IMPRESSION: 1. No acute traumatic injury identified in the lumbar spine. 2. Mildly transitional anatomy, same numbering system used on a 2016 lumbar CT. 3. Thoracic spinal stimulator device removed since 2016. And interval postoperative changes to the left lamina at L5-S1. 4. 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 And up to mild L3 through L5 foraminal stenosis appears stable. 5. Moderate to severe right T11  neural foraminal stenosis related to facet degeneration. Electronically Signed   By: Genevie Ann M.D.   On: 02/15/2020 19:36   CT ABDOMEN PELVIS W CONTRAST  Result Date: 02/16/2020 CLINICAL DATA:  Right upper quadrant abdominal pain for 10 days, fall yesterday EXAM: CT ABDOMEN AND PELVIS WITH CONTRAST TECHNIQUE: Multidetector CT imaging of the abdomen and pelvis was performed using the standard protocol following bolus administration of intravenous contrast. CONTRAST:  172mL OMNIPAQUE IOHEXOL 300 MG/ML  SOLN COMPARISON:  02/06/2020 FINDINGS: Lower chest: No acute abnormality. Hepatobiliary: No solid liver abnormality is seen. Hepatic steatosis. No gallstones, gallbladder wall thickening, or biliary dilatation. Pancreas: Fatty atrophy of the pancreas. No pancreatic ductal dilatation or surrounding inflammatory changes. Spleen: Normal in size without significant abnormality. Adrenals/Urinary Tract: Adrenal glands are unremarkable. Kidneys are normal, without renal calculi, solid lesion, or hydronephrosis. Bladder is unremarkable. Stomach/Bowel: Stomach is within normal limits. Appendix appears normal. No evidence of bowel wall thickening, distention, or inflammatory changes. Vascular/Lymphatic: No significant vascular findings are present. No enlarged abdominal or pelvic lymph nodes. Reproductive: No mass or other significant abnormality. Other: Fat containing bilateral inguinal hernias. No abdominopelvic ascites. Musculoskeletal: No acute or significant osseous findings. IMPRESSION: 1. No acute CT findings of the  abdomen or pelvis to explain right upper quadrant abdominal pain. 2.  No evidence of acute traumatic injury to the abdomen or pelvis. 3.  Hepatic steatosis. Electronically Signed   By: Eddie Candle M.D.   On: 02/16/2020 16:16   CT Ankle Left Wo Contrast  Result Date: 02/15/2020 CLINICAL DATA:  Ankle fracture after fall, post reduction EXAM: CT OF THE LEFT ANKLE WITHOUT CONTRAST TECHNIQUE: Multidetector  CT imaging of the left ankle was performed according to the standard protocol. Multiplanar CT image reconstructions were also generated. COMPARISON:  Radiograph same day FINDINGS: Bones/Joint/Cartilage There is tiny osseous chip fractures seen off the medial malleolus. There remains slight widening of the medial malleolus measuring up to 5 mm. Comminuted intra-articular mildly displaced fracture seen of the posterior malleolus. There tiny osseous fragment seen at the anterior tibiotalar joint. A mildly displaced obliquely oriented fracture seen of the distal fibula at the level of the ankle mortise with small surrounding fracture fragments within this seen is most CIS. No other definite fractures are identified. A small os navicular is present. Ligaments Suboptimally assessed by CT. Muscles and Tendons The muscles surrounding the ankle appear to be intact. The flexor and extensor tendons are intact. The Achilles tendon is intact. Soft tissues Diffuse soft tissue swelling is seen around the ankle, predominantly around the medial malleolus. There is a small ankle joint effusion present. Heel pad inflammation is present. IMPRESSION: 1. The patient is status post reduction of the trimalleolar fracture as described above. There remains slight widening of the medial clear space however. 2. Tiny osseous fragment seen at the medial anterior talar dome which could represent a small chip fracture. 3. Small ankle joint effusion and significant surrounding soft tissue edema. Electronically Signed   By: Prudencio Pair M.D.   On: 02/15/2020 19:36   DG Ankle Left Port  Result Date: 02/15/2020 CLINICAL DATA:  Post reduction EXAM: PORTABLE LEFT ANKLE - 2 VIEW COMPARISON:  Radiograph same day FINDINGS: There is been interval reduction of the fracture dislocation of the ankle. The ankle mortise appears to be in near anatomic alignment. There remains a obliquely oriented mildly displaced distal fibular fracture. IMPRESSION: Status  post reduction of ankle fracture dislocation in near anatomic alignment. Electronically Signed   By: Prudencio Pair M.D.   On: 02/15/2020 18:23   DG Ankle Left Port  Result Date: 02/15/2020 CLINICAL DATA:  Recent fall with ankle deformity, initial encounter EXAM: PORTABLE LEFT ANKLE - 2 VIEW COMPARISON:  None. FINDINGS: There is posterolateral dislocation of the talus with respect to the distal tibia. Oblique fracture through the distal fibula is noted. Some regularity in the region of the posterior malleolus is seen likely related to undisplaced fracture. IMPRESSION: Distal fibular fracture with posterolateral dislocation of the talus with respect to the distal tibia. There is likely a posterior malleolar fracture present as well. Electronically Signed   By: Inez Catalina M.D.   On: 02/15/2020 17:28    Scheduled Meds: . atorvastatin  10 mg Oral q1800  . enoxaparin (LOVENOX) injection  55 mg Subcutaneous Q24H  . folic acid  1 mg Oral Daily  . gabapentin  900 mg Oral BID  . lisinopril  20 mg Oral Daily  . montelukast  10 mg Oral QHS  . multivitamin with minerals  1 tablet Oral Daily  . sertraline  50 mg Oral Daily  . thiamine  100 mg Oral Daily   Or  . thiamine  100 mg Intravenous Daily   Continuous  Infusions:   LOS: 1 day    Time spent: 25 mins.    Shawna Clamp, MD [facility]   If 7PM-7AM, please contact night-coverage

## 2020-02-18 DIAGNOSIS — S82842A Displaced bimalleolar fracture of left lower leg, initial encounter for closed fracture: Secondary | ICD-10-CM | POA: Diagnosis not present

## 2020-02-18 MED ORDER — OXYCODONE HCL 5 MG PO TABS
10.0000 mg | ORAL_TABLET | ORAL | Status: DC | PRN
  Administered 2020-02-18 – 2020-02-22 (×22): 10 mg via ORAL
  Filled 2020-02-18 (×22): qty 2

## 2020-02-18 NOTE — NC FL2 (Addendum)
[place] LEVEL OF CARE SCREENING TOOL     IDENTIFICATION  Patient Name: Daniel Durham Birthdate: 05/20/1966 Sex: male Admission Date (Current Location): 02/15/2020  [facility] and [place] Number:  Herbalist and Address:  [facility] [place], [place] [address] [place], [place] [address]      Provider Number: [id number]  Attending Physician Name and Address:  Daniel Clamp, MD  Relative Name and Phone Number:       Current Level of Care: Hospital Recommended Level of Care: [place] Prior Approval Number:    Date Approved/Denied:   PASRR Number: [id number] E Expires 03/23/20  Discharge Plan: SNF    Current Diagnoses: Patient Active Problem List   Diagnosis Date Noted  . Closed left ankle fracture 02/16/2020  . Ankle fracture, bimalleolar, closed, left, initial encounter 02/15/2020  . Morbid obesity ([place]) 12/27/2019  . HTN (hypertension) 11/18/2019  . Acute respiratory failure with hypoxia ([place]) 11/17/2019  . Colon cancer screening 05/25/2018  . Anxiety and depression 03/03/2017  . Cervical disc disorder with radiculopathy of cervical region 07/24/2015  . 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 Visit for preventive health examination 02/04/2015  . Chronic back pain greater than 3 months duration 01/23/2015  . 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 Amputation stump complication ([place]) [id number]    Orientation RESPIRATION BLADDER Height & Weight     Self, Time, Situation, Place  Normal Continent Weight: 250 lb (113.4 kg) Height:  5\' 7"  (170.2 cm)  BEHAVIORAL SYMPTOMS/MOOD NEUROLOGICAL BOWEL NUTRITION STATUS      Continent Diet(see discharge summary)  AMBULATORY STATUS COMMUNICATION OF NEEDS Skin   Extensive Assist Verbally Skin abrasions, Other (Comment)(abrasions on arms/back/buttocks; generalized  ecchymosis; previous left hand amputation)                       Personal Care Assistance Level of Assistance  Bathing, Feeding, Dressing Bathing Assistance: Maximum assistance Feeding assistance: Independent Dressing Assistance: Maximum assistance     Functional Limitations Info  Speech, Hearing, Sight Sight Info: Adequate Hearing Info: Adequate Speech Info: Adequate    SPECIAL CARE FACTORS FREQUENCY  OT (By licensed OT), PT (By licensed PT)     PT Frequency: 5x week OT Frequency: 5x week            Contractures Contractures Info: Not present    Additional Factors Info  Code Status, Allergies, Psychotropic Code Status Info: Full Code Allergies Info: Aspirin, Hydrocodone Psychotropic Info: sertraline (ZOLOFT) tablet 50 mg daily PO         Current Medications (02/18/2020):  This is the current hospital active medication list Current Facility-Administered Medications  Medication Dose Route Frequency Provider Last Rate Last Admin  . atorvastatin (LIPITOR) tablet 10 mg  10 mg Oral q1800 Rise Patience, MD      . cyclobenzaprine (FLEXERIL) tablet 10 mg  10 mg Oral Daily PRN Rise Patience, MD   10 mg at 02/17/20 1108  . enoxaparin (LOVENOX) injection 55 mg  55 mg Subcutaneous Q24H Rise Patience, MD   55 mg at 02/18/20 0844  . folic acid (FOLVITE) tablet 1 mg  1 mg Oral Daily Rise Patience, MD   1 mg at 02/18/20 0843  . gabapentin (NEURONTIN) capsule 900 mg  900 mg Oral BID Rise Patience, MD   900 mg at 02/18/20 0843  .  lisinopril (ZESTRIL) tablet 20 mg  20 mg Oral Daily Rise Patience, MD   20 mg at 02/18/20 0842  . LORazepam (ATIVAN) tablet 1-4 mg  1-4 mg Oral Q1H PRN Rise Patience, MD   1 mg at 02/16/20 [id number]   Or  . LORazepam (ATIVAN) injection 1-4 mg  1-4 mg Intravenous Q1H PRN Rise Patience, MD   1 mg at 02/16/20 0401  . montelukast (SINGULAIR) tablet 10 mg  10 mg Oral QHS Rise Patience, MD   10 mg at  02/17/20 2118  . multivitamin with minerals tablet 1 tablet  1 tablet Oral Daily Rise Patience, MD   1 tablet at 02/18/20 [phone]  . ondansetron (ZOFRAN) tablet 4 mg  4 mg Oral Q6H PRN Rise Patience, MD       Or  . ondansetron [facility]) injection 4 mg  4 mg Intravenous Q6H PRN Rise Patience, MD   4 mg at 02/16/20 0102  . oxyCODONE (Oxy IR/ROXICODONE) immediate release tablet 10 mg  10 mg Oral Q4H PRN Nicholes Stairs, MD      . sertraline (ZOLOFT) tablet 50 mg  50 mg Oral Daily Rise Patience, MD   50 mg at 02/18/20 0842  . thiamine tablet 100 mg  100 mg Oral Daily Rise Patience, MD   100 mg at 02/18/20 [id number]   Or  . thiamine (B-1) injection 100 mg  100 mg Intravenous Daily Rise Patience, MD         Discharge Medications: Please see discharge summary for a list of discharge medications.  Relevant Imaging Results:  Relevant Lab Results:   Additional Information SS#[id number] [name], [place]

## 2020-02-18 NOTE — Progress Notes (Signed)
   Subjective:  Patient reports pain as moderate.  Denies any chest pain or shortness of breath.  Is complaining of some breakthrough pain that he feels would benefit from increased frequency of narcotic.  No paresthesias or numbness.  Objective:   VITALS:   Vitals:   02/17/20 1416 02/17/20 1931 02/18/20 0311 02/18/20 0840  BP: 118/68 136/62 134/82 127/76  Pulse: 69 78 70 65  Resp: 17 18 18 18   Temp: 98.6 F (37 C) 98.7 F (37.1 C) 98.4 F (36.9 C) 98.3 F (36.8 C)  TempSrc: Oral Oral Oral Tympanic  SpO2: 93% 93% 95% 97%  Weight:      Height:        Short leg splint on left ankle. -Wiggles toes.  No pain with passive stretch.  No signs of compartment syndrome. -Sensation intact to light touch in the deep and superficial peroneal nerve. -Capillary refill less than 2 seconds. -Dystrophic toenails -Negative wrinkle sign at this time.  No fracture blisters appreciated.  Lab Results  Component Value Date   WBC 7.1 02/17/2020   HGB 13.9 02/17/2020   HCT 41.9 02/17/2020   MCV 103.7 (H) 02/17/2020   PLT 146 (L) 02/17/2020   BMET    Component Value Date/Time   NA 135 02/17/2020 0505   K 4.2 02/17/2020 0505   CL 99 02/17/2020 0505   CO2 25 02/17/2020 0505   GLUCOSE 116 (H) 02/17/2020 0505   BUN 9 02/17/2020 0505   CREATININE 1.09 02/17/2020 0505   CALCIUM 8.7 (L) 02/17/2020 0505   GFRNONAA >60 02/17/2020 0505   GFRAA >60 02/17/2020 0505     Assessment/Plan:     Principal Problem:   Ankle fracture, bimalleolar, closed, left, initial encounter Active Problems:   HTN (hypertension)   Closed left ankle fracture   Up with therapy -Strict elevation with "toes above nose."  Of the left lower extremity.  -At this time it may be more advantageous to have him stay in-house until his surgery on Thursday.  I think his swelling is certainly heading in the right direction but would recommend aggressive elevation and ice to the left ankle.  -I will tentatively place him  on the operative schedule for Thursday morning with myself.  Patient and I have discussed the nature of the surgery at length and he is in agreement with that plan. -Nonweightbearing left lower extremity.   Nicholes Stairs 02/18/2020, 10:13 AM   Geralynn Rile, MD [phone]

## 2020-02-18 NOTE — Progress Notes (Signed)
PROGRESS NOTE    Daniel Durham  [id number] DOB: [birth year] DOA: 02/15/2020 PCP: Brunetta Jeans, PA-C   Brief Narrative:  Daniel Durham is a 54 y.o. male with history of hypertension, chronic back pain asthma was brought to the ER after patient had a fall.  Patient states he slipped and fell today at his head and lost consciousness briefly.  He hurt his left ankle and was brought to the ER.  Denies any chest pain or shortness of breath.  Has been worked up for his gallstones and has follow-up with surgery next week.  He drinks alcohol every day.  ED Course: In the ER patient had x-ray showed left ankle fracture for which Dr. Stann Mainland orthopedic surgeon was consulted and requested follow-up with orthopedics in a week for further management since patient's swelling is to decrease before surgery.  This patient has significant pain and has left upper forearm amputation can find it difficult to walk with crutches and will need further help.  Patient has significant pain and difficulty ambulate admitted for further observation.  Labs are largely unremarkable except for most some macrocytosis.  Covid test was negative.  CT head C-spine and L-spine were unremarkable.  Assessment & Plan:   Principal Problem:   Ankle fracture, bimalleolar, closed, left, initial encounter Active Problems:   HTN (hypertension)   Closed left ankle fracture  1. Left ankle fracture for which patient will need follow-up with Dr. Stann Mainland orthopedic surgeon as outpatient for further management but since patient has significant pain and difficulty ambulating patient admitted for observation we will get physical therapy consult.     Adequate pain control , Ortho suggests staying in house for proper care, fracture is unstable, OR on Thursday.  There is a significant soft tissue swelling that needs to be resolved.  2. Hypertension on lisinopril. 3. History of alcohol abuse on CIWA protocol. 4. History of asthma presently  not wheezing. 5. Chronic pain on oxycodone Flexeril and gabapentin. 6. Right upper quadrant abdominal pain : Surgery consulted,  will follow up recommendation history of gallstones for which patient has follow-up with general surgery.  General surgery suggest outpatient follow-up.   DVT prophylaxis: Lovenox. Code Status: Full code. Family Communication: Patient's wife. Disposition Plan:  Skilled nursing facility.  It is unsafe for the patient to be discharged home given amputated left arm, Barriers: High risk for falls, not able to use crutches, SNF authorization pending. Consults called: ER physician discussed with Dr. Stann Mainland.  Physical therapy. Admission status: Inpatient   Consultants:   General surgery  Orthopedics  Procedures:  Antimicrobials:  Anti-infectives (From admission, onward)   None      Subjective: Patient was seen and examined at bedside.  He reports abdominal pain is better, asking about the strong pain medication , states pain is not controlled with oxycodone. Objective: Vitals:   02/17/20 1416 02/17/20 1931 02/18/20 0311 02/18/20 0840  BP: 118/68 136/62 134/82 127/76  Pulse: 69 78 70 65  Resp: 17 18 18 18   Temp: 98.6 F (37 C) 98.7 F (37.1 C) 98.4 F (36.9 C) 98.3 F (36.8 C)  TempSrc: Oral Oral Oral Tympanic  SpO2: 93% 93% 95% 97%  Weight:      Height:        Intake/Output Summary (Last 24 hours) at 02/18/2020 1457 Last data filed at 02/18/2020 1437 Gross per 24 hour  Intake 480 ml  Output 1400 ml  Net -920 ml   Filed Weights   02/15/20 1654  Weight:  113.4 kg    Examination:  General exam: Appears calm and comfortable  Respiratory system: Clear to auscultation. Respiratory effort normal. Cardiovascular system: S1 & S2 heard, RRR. No JVD, murmurs, rubs, gallops or clicks. No pedal edema. Gastrointestinal system: Abdomen is nondistended, tenderness noted in RUQ,. No organomegaly or masses felt. Normal bowel sounds heard. Central  nervous system: Alert and oriented. No focal neurological deficits. Extremities: amputated left hand, left ankle in stockings. Skin: No rashes, lesions or ulcers Psychiatry: Judgement and insight appear normal. Mood & affect appropriate.     Data Reviewed: I have personally reviewed following labs and imaging studies  CBC: Recent Labs  Lab 02/15/20 1654 02/16/20 0508 02/17/20 0505  WBC 5.4 9.6 7.1  NEUTROABS 2.9  --   --   HGB 16.1 14.5 13.9  HCT 46.4 42.7 41.9  MCV 98.5 100.9* 103.7*  PLT 183 160 123456*   Basic Metabolic Panel: Recent Labs  Lab 02/15/20 1654 02/16/20 0508 02/17/20 0505  NA 139 136 135  K 4.1 4.1 4.2  CL 104 103 99  CO2 19* 22 25  GLUCOSE 104* 117* 116*  BUN 10 9 9   CREATININE 0.95 0.95 1.09  CALCIUM 8.8* 8.2* 8.7*  MG  --  1.9  --   PHOS  --  3.0  --    GFR: Estimated Creatinine Clearance: 95.3 mL/min (by C-G formula based on SCr of 1.09 mg/dL). Liver Function Tests: Recent Labs  Lab 02/15/20 1654 02/16/20 0508 02/17/20 0505  AST 72* 61* 35  ALT 61* 59* 46*  ALKPHOS 74 72 59  BILITOT 0.8 0.8 0.9  PROT 7.0 6.6 6.3*  ALBUMIN 3.9 4.0 3.6   Recent Labs  Lab 02/16/20 1414  LIPASE 23   No results for input(s): AMMONIA in the last 168 hours. Coagulation Profile: No results for input(s): INR, PROTIME in the last 168 hours. Cardiac Enzymes: No results for input(s): CKTOTAL, CKMB, CKMBINDEX, TROPONINI in the last 168 hours. BNP (last 3 results) No results for input(s): PROBNP in the last 8760 hours. HbA1C: No results for input(s): HGBA1C in the last 72 hours. CBG: Recent Labs  Lab 02/16/20 0646 02/16/20 1139 02/16/20 1646  GLUCAP 112* 101* 96   Lipid Profile: No results for input(s): CHOL, HDL, LDLCALC, TRIG, CHOLHDL, LDLDIRECT in the last 72 hours. Thyroid Function Tests: No results for input(s): TSH, T4TOTAL, FREET4, T3FREE, THYROIDAB in the last 72 hours. Anemia Panel: No results for input(s): VITAMINB12, FOLATE, FERRITIN,  TIBC, IRON, RETICCTPCT in the last 72 hours. Sepsis Labs: No results for input(s): PROCALCITON, LATICACIDVEN in the last 168 hours.  Recent Results (from the past 240 hour(s))  SARS Coronavirus 2 by RT PCR     Status: None   Collection Time: 02/16/20  4:37 AM  Result Value Ref Range Status   SARS Coronavirus 2 NEGATIVE NEGATIVE Final    Comment: (NOTE) Result indicates the ABSENCE of SARS-CoV-2 RNA in the patient specimen.  The lowest concentration of SARS-CoV-2 viral copies this assay can detect in nasopharyngeal swab specimens is 500 copies / mL.  A negative result does not preclude SARS-CoV-2 infection and should not be used as the sole basis for patient management decisions. A negative result may occur with improper specimen collection / handling, submission of a specimen other than nasopharyngeal swab, presence of viral mutation(s) within the areas targeted by this assay, and inadequate number of viral copies (<500 copies / mL) present.  Negative results must be combined with clinical observations, patient history, and  epidemiological information.  The expected result is NEGATIVE.  Patient Fact Sheet:  BlogSelections.co.uk   Provider Fact Sheet:  https://lucas.com/   This test is not yet approved or cleared by the [place] FDA and  has been authorized for  detection and/or diagnosis of SARS-CoV-2 by FDA under an Emergency Use Authorization (EUA).  This EUA will remain in effect (meaning this test can be used) for the duration of  the COVID-19 declaration under Section 564(b)(1) of the Act, 21 U.S.C. section 360bbb-3(b)(1), unless the authorization is terminated or revoked sooner Performed at [facility], [place] [address] [place], [place] [address]      Radiology Studies: CT ABDOMEN PELVIS W CONTRAST  Result Date: 02/16/2020 CLINICAL DATA:  Right upper quadrant abdominal pain for 10 days, fall yesterday EXAM: CT  ABDOMEN AND PELVIS WITH CONTRAST TECHNIQUE: Multidetector CT imaging of the abdomen and pelvis was performed using the standard protocol following bolus administration of intravenous contrast. CONTRAST:  1103mL OMNIPAQUE IOHEXOL 300 MG/ML  SOLN COMPARISON:  02/06/2020 FINDINGS: Lower chest: No acute abnormality. Hepatobiliary: No solid liver abnormality is seen. Hepatic steatosis. No gallstones, gallbladder wall thickening, or biliary dilatation. Pancreas: Fatty atrophy of the pancreas. No pancreatic ductal dilatation or surrounding inflammatory changes. Spleen: Normal in size without significant abnormality. Adrenals/Urinary Tract: Adrenal glands are unremarkable. Kidneys are normal, without renal calculi, solid lesion, or hydronephrosis. Bladder is unremarkable. Stomach/Bowel: Stomach is within normal limits. Appendix appears normal. No evidence of bowel wall thickening, distention, or inflammatory changes. Vascular/Lymphatic: No significant vascular findings are present. No enlarged abdominal or pelvic lymph nodes. Reproductive: No mass or other significant abnormality. Other: Fat containing bilateral inguinal hernias. No abdominopelvic ascites. Musculoskeletal: No acute or significant osseous findings. IMPRESSION: 1. No acute CT findings of the abdomen or pelvis to explain right upper quadrant abdominal pain. 2.  No evidence of acute traumatic injury to the abdomen or pelvis. 3.  Hepatic steatosis. Electronically Signed   By: Eddie Candle M.D.   On: 02/16/2020 16:16    Scheduled Meds: . atorvastatin  10 mg Oral q1800  . enoxaparin (LOVENOX) injection  55 mg Subcutaneous Q24H  . folic acid  1 mg Oral Daily  . gabapentin  900 mg Oral BID  . lisinopril  20 mg Oral Daily  . montelukast  10 mg Oral QHS  . multivitamin with minerals  1 tablet Oral Daily  . sertraline  50 mg Oral Daily  . thiamine  100 mg Oral Daily   Or  . thiamine  100 mg Intravenous Daily   Continuous Infusions:   LOS: 1 day     Time spent: 25 mins.    Shawna Clamp, MD [facility]   If 7PM-7AM, please contact night-coverage

## 2020-02-18 NOTE — Plan of Care (Signed)

## 2020-02-19 DIAGNOSIS — Z79899 Other long term (current) drug therapy: Secondary | ICD-10-CM | POA: Diagnosis not present

## 2020-02-19 DIAGNOSIS — Z20822 Contact with and (suspected) exposure to covid-19: Secondary | ICD-10-CM | POA: Diagnosis present

## 2020-02-19 DIAGNOSIS — S82852A Displaced trimalleolar fracture of left lower leg, initial encounter for closed fracture: Secondary | ICD-10-CM | POA: Diagnosis not present

## 2020-02-19 DIAGNOSIS — Z825 Family history of asthma and other chronic lower respiratory diseases: Secondary | ICD-10-CM | POA: Diagnosis not present

## 2020-02-19 DIAGNOSIS — M25572 Pain in left ankle and joints of left foot: Secondary | ICD-10-CM | POA: Diagnosis present

## 2020-02-19 DIAGNOSIS — K8020 Calculus of gallbladder without cholecystitis without obstruction: Secondary | ICD-10-CM | POA: Diagnosis present

## 2020-02-19 DIAGNOSIS — D7589 Other specified diseases of blood and blood-forming organs: Secondary | ICD-10-CM | POA: Diagnosis present

## 2020-02-19 DIAGNOSIS — N179 Acute kidney failure, unspecified: Secondary | ICD-10-CM | POA: Diagnosis not present

## 2020-02-19 DIAGNOSIS — Z79891 Long term (current) use of opiate analgesic: Secondary | ICD-10-CM | POA: Diagnosis not present

## 2020-02-19 DIAGNOSIS — F1010 Alcohol abuse, uncomplicated: Secondary | ICD-10-CM | POA: Diagnosis present

## 2020-02-19 DIAGNOSIS — R55 Syncope and collapse: Secondary | ICD-10-CM | POA: Diagnosis present

## 2020-02-19 DIAGNOSIS — E669 Obesity, unspecified: Secondary | ICD-10-CM | POA: Diagnosis present

## 2020-02-19 DIAGNOSIS — G894 Chronic pain syndrome: Secondary | ICD-10-CM | POA: Diagnosis present

## 2020-02-19 DIAGNOSIS — S93432A Sprain of tibiofibular ligament of left ankle, initial encounter: Secondary | ICD-10-CM | POA: Diagnosis present

## 2020-02-19 DIAGNOSIS — Z885 Allergy status to narcotic agent status: Secondary | ICD-10-CM | POA: Diagnosis not present

## 2020-02-19 DIAGNOSIS — J45909 Unspecified asthma, uncomplicated: Secondary | ICD-10-CM | POA: Diagnosis present

## 2020-02-19 DIAGNOSIS — Z886 Allergy status to analgesic agent status: Secondary | ICD-10-CM | POA: Diagnosis not present

## 2020-02-19 DIAGNOSIS — S82842A Displaced bimalleolar fracture of left lower leg, initial encounter for closed fracture: Secondary | ICD-10-CM | POA: Diagnosis not present

## 2020-02-19 DIAGNOSIS — Y906 Blood alcohol level of 120-199 mg/100 ml: Secondary | ICD-10-CM | POA: Diagnosis present

## 2020-02-19 DIAGNOSIS — M25531 Pain in right wrist: Secondary | ICD-10-CM | POA: Diagnosis not present

## 2020-02-19 DIAGNOSIS — I10 Essential (primary) hypertension: Secondary | ICD-10-CM | POA: Diagnosis present

## 2020-02-19 DIAGNOSIS — M25532 Pain in left wrist: Secondary | ICD-10-CM | POA: Diagnosis present

## 2020-02-19 DIAGNOSIS — S82852B Displaced trimalleolar fracture of left lower leg, initial encounter for open fracture type I or II: Secondary | ICD-10-CM | POA: Diagnosis present

## 2020-02-19 DIAGNOSIS — K760 Fatty (change of) liver, not elsewhere classified: Secondary | ICD-10-CM | POA: Diagnosis present

## 2020-02-19 DIAGNOSIS — Z9682 Presence of neurostimulator: Secondary | ICD-10-CM | POA: Diagnosis not present

## 2020-02-19 DIAGNOSIS — Y92009 Unspecified place in unspecified non-institutional (private) residence as the place of occurrence of the external cause: Secondary | ICD-10-CM | POA: Diagnosis not present

## 2020-02-19 DIAGNOSIS — W01198A Fall on same level from slipping, tripping and stumbling with subsequent striking against other object, initial encounter: Secondary | ICD-10-CM | POA: Diagnosis present

## 2020-02-19 DIAGNOSIS — Z6839 Body mass index (BMI) 39.0-39.9, adult: Secondary | ICD-10-CM | POA: Diagnosis not present

## 2020-02-19 DIAGNOSIS — Z8249 Family history of ischemic heart disease and other diseases of the circulatory system: Secondary | ICD-10-CM | POA: Diagnosis not present

## 2020-02-19 NOTE — Plan of Care (Signed)

## 2020-02-19 NOTE — Progress Notes (Signed)
Subjective:     Patient reports pain as mild to moderate.  Reports that swelling in ankle is better today.  States that he has been elevating L LE. Up eating breakfast.    Objective:   VITALS:  Temp:  [97.9 F (36.6 C)-98.4 F (36.9 C)] 98 F (36.7 C) (04/18 0806) Pulse Rate:  [65-79] 79 (04/18 0806) Resp:  [18-20] 19 (04/18 0806) BP: (118-143)/(66-81) 137/81 (04/18 0806) SpO2:  [92 %-97 %] 93 % (04/18 0806)  General: WDWN patient in NAD. Psych:  Appropriate mood and affect. Neuro:  A&O x 3, Moving all extremities, sensation intact to light touch HEENT:  EOMs intact Chest:  Even non-labored respirations Skin: SLS C/D/I, no rashes or lesions Extremities: warm/dry, mild edema to L LE, no erythema or echymosis.  No lymphadenopathy. Pulses: Popliteus 2+ MSK:  ROM: EHL/FHL intact, MMT: able to perform quad set    LABS Recent Labs    02/17/20 0505  HGB 13.9  WBC 7.1  PLT 146*   Recent Labs    02/17/20 0505  NA 135  K 4.2  CL 99  CO2 25  BUN 9  CREATININE 1.09  GLUCOSE 116*   No results for input(s): LABPT, INR in the last 72 hours.   Assessment/Plan:    Left ankle bimalleolar fx  Patient seen in rounds for Dr. Stann Mainland  Up with therapy  NWB L LE  Strict elevation of L LE with "toes above nose"  Tentatively planned for OR on Thursday with Dr. Stann Mainland for ORIF L bimal fx.  Mechele Claude PA-C [facility] Office:  [phone]

## 2020-02-19 NOTE — Plan of Care (Signed)
  Problem: Pain Managment: Goal: General experience of comfort will improve Outcome: Progressing   Problem: Safety: Goal: Ability to remain free from injury will improve Outcome: Progressing   Problem: Skin Integrity: Goal: Risk for impaired skin integrity will decrease Outcome: Progressing   

## 2020-02-19 NOTE — Progress Notes (Signed)
Notified Dr Silas [place] that pt c/o pain to Rt lateral hand. States it has been hurting since yesterday. Pt has some edema, weak grip to the right hand- states may be due to fall prior to admission. Oxycodone given per prn orders. RN will continue to monitor.

## 2020-02-19 NOTE — Progress Notes (Signed)
Spoke with Dr.Kumar and he approved pt wife to stay overnight in the hospital with him. Will pass on to night shift, will continue to monitor.

## 2020-02-19 NOTE — Progress Notes (Signed)
PROGRESS NOTE    Antwan Tritten  [id number] DOB: May 13, 1966 DOA: 02/15/2020 PCP: Brunetta Jeans, PA-C   Brief Narrative:  Blaiden Lac is a 54 y.o. male with history of hypertension, chronic back pain asthma was brought to the ER after patient had a fall.  Patient states he slipped and fell today at his head and lost consciousness briefly.  He hurt his left ankle and was brought to the ER.  Denies any chest pain or shortness of breath.  Has been worked up for his gallstones and has follow-up with surgery next week.  He drinks alcohol every day.  ED Course: In the ER patient had x-ray showed left ankle fracture for which Dr. Stann Mainland orthopedic surgeon was consulted and requested follow-up with orthopedics in a week for further management since patient's swelling is to decrease before surgery.  This patient has significant pain and has left upper forearm amputation can find it difficult to walk with crutches and will need further help.  Patient has significant pain and difficulty ambulate admitted for further observation.  Labs are largely unremarkable except for most some macrocytosis.  Covid test was negative.  CT head C-spine and L-spine were unremarkable.  Assessment & Plan:   Principal Problem:   Ankle fracture, bimalleolar, closed, left, initial encounter Active Problems:   HTN (hypertension)   Closed left ankle fracture  1. Left ankle fracture : Initial plan was follow up with Dr. Stann Mainland orthopedic surgeon as outpatient for further management but since patient has significant pain and difficulty ambulating patient admitted,  Adequate pain control , Ortho suggests staying in hospital for proper care, fracture is unstable, OR on Thursday.  There is a significant soft tissue swelling that needs to be resolved before surgical intervention.  2. Hypertension on lisinopril. 3. History of alcohol abuse on CIWA protocol. 4. History of asthma presently not wheezing. 5. Chronic pain on  oxycodone Flexeril and gabapentin. 6. Right upper quadrant abdominal pain : Surgery consulted,  will follow up recommendation history of gallstones for which patient has follow-up with general surgery.  General surgery suggest outpatient follow-up.   DVT prophylaxis: Lovenox. Code Status: Full code. Family Communication: Patient's wife. Disposition Plan:  Skilled nursing facility.  It is unsafe for the patient to be discharged home given amputated left forearm, Barriers: High risk for falls, not able to use crutches, SNF authorization pending. Consults called: ER physician discussed with Dr. Stann Mainland.  Physical therapy. Admission status: Inpatient   Consultants:   General surgery  Orthopedics  Procedures:  Antimicrobials:  Anti-infectives (From admission, onward)   None      Subjective: Patient was seen and examined at bedside.  He reports abdominal pain is better, asking about the strong pain medication , states pain is not controlled with oxycodone. Objective: Vitals:   02/18/20 1814 02/18/20 1954 02/19/20 0452 02/19/20 0806  BP: 133/77 (!) 143/66 118/78 137/81  Pulse: 74 66 67 79  Resp: 20 18 18 19   Temp: 97.9 F (36.6 C) 98.4 F (36.9 C) 98.4 F (36.9 C) 98 F (36.7 C)  TempSrc: Oral Oral Oral Oral  SpO2: 94% 97% 92% 93%  Weight:      Height:        Intake/Output Summary (Last 24 hours) at 02/19/2020 1537 Last data filed at 02/19/2020 0900 Gross per 24 hour  Intake 240 ml  Output 1500 ml  Net -1260 ml   Filed Weights   02/15/20 1654  Weight: 113.4 kg    Examination:  General exam: Appears calm and comfortable  Respiratory system: Clear to auscultation. Respiratory effort normal. Cardiovascular system: S1 & S2 heard, RRR. No JVD, murmurs, rubs, gallops or clicks. No pedal edema. Gastrointestinal system: Abdomen is nondistended, tenderness noted in RUQ,. No organomegaly or masses felt. Normal bowel sounds heard. Central nervous system: Alert and  oriented. No focal neurological deficits. Extremities: amputated left hand, left ankle in stockings. Skin: No rashes, lesions or ulcers Psychiatry: Judgement and insight appear normal. Mood & affect appropriate.     Data Reviewed: I have personally reviewed following labs and imaging studies  CBC: Recent Labs  Lab 02/15/20 1654 02/16/20 0508 02/17/20 0505  WBC 5.4 9.6 7.1  NEUTROABS 2.9  --   --   HGB 16.1 14.5 13.9  HCT 46.4 42.7 41.9  MCV 98.5 100.9* 103.7*  PLT 183 160 123456*   Basic Metabolic Panel: Recent Labs  Lab 02/15/20 1654 02/16/20 0508 02/17/20 0505  NA 139 136 135  K 4.1 4.1 4.2  CL 104 103 99  CO2 19* 22 25  GLUCOSE 104* 117* 116*  BUN 10 9 9   CREATININE 0.95 0.95 1.09  CALCIUM 8.8* 8.2* 8.7*  MG  --  1.9  --   PHOS  --  3.0  --    GFR: Estimated Creatinine Clearance: 95.3 mL/min (by C-G formula based on SCr of 1.09 mg/dL). Liver Function Tests: Recent Labs  Lab 02/15/20 1654 02/16/20 0508 02/17/20 0505  AST 72* 61* 35  ALT 61* 59* 46*  ALKPHOS 74 72 59  BILITOT 0.8 0.8 0.9  PROT 7.0 6.6 6.3*  ALBUMIN 3.9 4.0 3.6   Recent Labs  Lab 02/16/20 1414  LIPASE 23   No results for input(s): AMMONIA in the last 168 hours. Coagulation Profile: No results for input(s): INR, PROTIME in the last 168 hours. Cardiac Enzymes: No results for input(s): CKTOTAL, CKMB, CKMBINDEX, TROPONINI in the last 168 hours. BNP (last 3 results) No results for input(s): PROBNP in the last 8760 hours. HbA1C: No results for input(s): HGBA1C in the last 72 hours. CBG: Recent Labs  Lab 02/16/20 0646 02/16/20 1139 02/16/20 1646  GLUCAP 112* 101* 96   Lipid Profile: No results for input(s): CHOL, HDL, LDLCALC, TRIG, CHOLHDL, LDLDIRECT in the last 72 hours. Thyroid Function Tests: No results for input(s): TSH, T4TOTAL, FREET4, T3FREE, THYROIDAB in the last 72 hours. Anemia Panel: No results for input(s): VITAMINB12, FOLATE, FERRITIN, TIBC, IRON, RETICCTPCT in the  last 72 hours. Sepsis Labs: No results for input(s): PROCALCITON, LATICACIDVEN in the last 168 hours.  Recent Results (from the past 240 hour(s))  SARS Coronavirus 2 by RT PCR     Status: None   Collection Time: 02/16/20  4:37 AM  Result Value Ref Range Status   SARS Coronavirus 2 NEGATIVE NEGATIVE Final    Comment: (NOTE) Result indicates the ABSENCE of SARS-CoV-2 RNA in the patient specimen.  The lowest concentration of SARS-CoV-2 viral copies this assay can detect in nasopharyngeal swab specimens is 500 copies / mL.  A negative result does not preclude SARS-CoV-2 infection and should not be used as the sole basis for patient management decisions. A negative result may occur with improper specimen collection / handling, submission of a specimen other than nasopharyngeal swab, presence of viral mutation(s) within the areas targeted by this assay, and inadequate number of viral copies (<500 copies / mL) present.  Negative results must be combined with clinical observations, patient history, and epidemiological information.  The expected result is  NEGATIVE.  Patient Fact Sheet:  BlogSelections.co.uk   Provider Fact Sheet:  https://lucas.com/   This test is not yet approved or cleared by the [place] FDA and  has been authorized for  detection and/or diagnosis of SARS-CoV-2 by FDA under an Emergency Use Authorization (EUA).  This EUA will remain in effect (meaning this test can be used) for the duration of  the COVID-19 declaration under Section 564(b)(1) of the Act, 21 U.S.C. section 360bbb-3(b)(1), unless the authorization is terminated or revoked sooner Performed at [facility], [place] [address], [place], [place] [address]      Radiology Studies: No results found.  Scheduled Meds: . atorvastatin  10 mg Oral q1800  . enoxaparin (LOVENOX) injection  55 mg Subcutaneous Q24H  . folic acid  1 mg Oral Daily  . gabapentin   900 mg Oral BID  . lisinopril  20 mg Oral Daily  . montelukast  10 mg Oral QHS  . multivitamin with minerals  1 tablet Oral Daily  . sertraline  50 mg Oral Daily  . thiamine  100 mg Oral Daily   Or  . thiamine  100 mg Intravenous Daily   Continuous Infusions:   LOS: 1 day    Time spent: 25 mins.    Shawna Clamp, MD [facility]   If 7PM-7AM, please contact night-coverage

## 2020-02-20 ENCOUNTER — Inpatient Hospital Stay (HOSPITAL_COMMUNITY): Payer: Medicare HMO

## 2020-02-20 DIAGNOSIS — E669 Obesity, unspecified: Secondary | ICD-10-CM

## 2020-02-20 DIAGNOSIS — G894 Chronic pain syndrome: Secondary | ICD-10-CM

## 2020-02-20 DIAGNOSIS — S82852A Displaced trimalleolar fracture of left lower leg, initial encounter for closed fracture: Secondary | ICD-10-CM

## 2020-02-20 DIAGNOSIS — R748 Abnormal levels of other serum enzymes: Secondary | ICD-10-CM

## 2020-02-20 DIAGNOSIS — Y92009 Unspecified place in unspecified non-institutional (private) residence as the place of occurrence of the external cause: Secondary | ICD-10-CM

## 2020-02-20 DIAGNOSIS — F1010 Alcohol abuse, uncomplicated: Secondary | ICD-10-CM

## 2020-02-20 DIAGNOSIS — M25531 Pain in right wrist: Secondary | ICD-10-CM

## 2020-02-20 DIAGNOSIS — W19XXXA Unspecified fall, initial encounter: Secondary | ICD-10-CM

## 2020-02-20 MED ORDER — SENNOSIDES-DOCUSATE SODIUM 8.6-50 MG PO TABS: 1.0000 | ORAL_TABLET | Freq: Two times a day (BID) | ORAL | Status: DC | PRN

## 2020-02-20 MED ORDER — ACETAMINOPHEN 500 MG PO TABS
1000.0000 mg | ORAL_TABLET | Freq: Three times a day (TID) | ORAL | Status: AC
  Administered 2020-02-20 – 2020-02-25 (×15): 1000 mg via ORAL
  Filled 2020-02-20 (×15): qty 2

## 2020-02-20 MED ORDER — POLYETHYLENE GLYCOL 3350 17 G PO PACK: 17.0000 g | PACK | Freq: Two times a day (BID) | ORAL | Status: DC | PRN

## 2020-02-20 NOTE — Plan of Care (Signed)
  Problem: Pain Managment: Goal: General experience of comfort will improve Outcome: Progressing   Problem: Safety: Goal: Ability to remain free from injury will improve Outcome: Progressing   Problem: Skin Integrity: Goal: Risk for impaired skin integrity will decrease Outcome: Progressing   

## 2020-02-20 NOTE — Progress Notes (Signed)
Physical Therapy Treatment Patient Details Name: Daniel Durham MRN: [id number] DOB: [birth year] Today's Date: 02/20/2020    History of Present Illness Pt is a 54 y/o male admitted secondary to L ankle fx s/p reduction. PMH includes alcohol use, COVID, HTN, and Traumatic L forearm amputation.     PT Comments    Pt is demonstrating better control of transfers today, noting his ability to stand with hand rail and RLE today.  Talked with him about his options with [place] walker or platform possibly to take steps with NWB on LLE.  Continue to recommend rehab stay to recover all mobility and will work on same goals with acute therapy.     Follow Up Recommendations  SNF     Equipment Recommendations  Wheelchair (measurements PT);Wheelchair cushion (measurements PT)    Recommendations for Other Services       Precautions / Restrictions Precautions Precautions: Fall Restrictions Weight Bearing Restrictions: Yes LLE Weight Bearing: Non weight bearing    Mobility  Bed Mobility Overal bed mobility: Modified Independent Bed Mobility: Supine to Sit;Sit to Supine              Transfers Overall transfer level: Needs assistance Equipment used: 1 person hand held assist Transfers: Sit to/from Stand;Lateral/Scoot Transfers Sit to Stand: Min guard        Lateral/Scoot Transfers: Supervision General transfer comment: Pt was able to stand with RLE and bed rail with min guard, slding on bed with RLE only with no assist  Ambulation/Gait             General Gait Details: non ambulatory   Stairs             Wheelchair Mobility    Modified Rankin (Stroke Patients Only)       Balance Overall balance assessment: Needs assistance Sitting-balance support: Single extremity supported Sitting balance-Leahy Scale: Fair                                      Cognition Arousal/Alertness: Awake/alert Behavior During Therapy: WFL for tasks  assessed/performed Overall Cognitive Status: Within Functional Limits for tasks assessed                                        Exercises General Exercises - Lower Extremity Ankle Circles/Pumps: AROM;Right Quad Sets: AROM;10 reps Long Arc Quad: Strengthening;AROM;10 reps Heel Slides: AROM;Strengthening;10 reps Hip ABduction/ADduction: Strengthening;AROM;10 reps Hip Flexion/Marching: AROM;10 reps    General Comments General comments (skin integrity, edema, etc.): pt talked with PT about alternate AD's for gait and standing balance, such as Eva walker or platfrom      Pertinent Vitals/Pain Pain Assessment: Faces Faces Pain Scale: Hurts little more Pain Location: L ankle     Home Living                      Prior Function            PT Goals (current goals can now be found in the care plan section) Acute Rehab PT Goals Patient Stated Goal: to be more independent before going home Progress towards PT goals: Progressing toward goals    Frequency    Min 3X/week      PT Plan Current plan remains appropriate    Co-evaluation  AM-PAC PT "6 Clicks" Mobility   Outcome Measure  Help needed turning from your back to your side while in a flat bed without using bedrails?: A Little Help needed moving from lying on your back to sitting on the side of a flat bed without using bedrails?: A Little Help needed moving to and from a bed to a chair (including a wheelchair)?: A Lot Help needed standing up from a chair using your arms (e.g., wheelchair or bedside chair)?: A Lot Help needed to walk in hospital room?: Total Help needed climbing 3-5 steps with a railing? : Total 6 Click Score: 12    End of Session   Activity Tolerance: Treatment limited secondary to medical complications (Comment) Patient left: in bed;with call bell/phone within reach;with bed alarm set Nurse Communication: Mobility status PT Visit Diagnosis: Difficulty in  walking, not elsewhere classified (R26.2);Pain Pain - Right/Left: Left Pain - part of body: Ankle and joints of foot     Time: [id number] PT Time Calculation (min) (ACUTE ONLY): 23 min  Charges:  $Therapeutic Exercise: 8-22 mins $Therapeutic Activity: 8-22 mins                   Ramond Dial 02/20/2020, 10:27 PM  Mee Hives, PT MS Acute Rehab Dept. Number: [place] and [place]

## 2020-02-20 NOTE — Progress Notes (Signed)
PROGRESS NOTE  Daniel Durham [id number] DOB: May 19, 1966   PCP: Brunetta Jeans, PA-C  Patient is from: Home.  DOA: 02/15/2020 LOS: 1  Brief Narrative / Interim history: 54 year old male with history of chronic back pain, asthma, EtOH use, LUE amputation and HTN presenting after mechanical fall with head impaction and brief LOC, and left ankle fracture as noted on x-ray.    In ED, left ankle x-ray revealed revealed trimalleolar fracture with tiny osseous fragments seen on the medial anterior talar dome.  Patient had reduction in ED.  Orthopedic surgery, Dr. Stann Mainland consulted, and initially recommended outpatient follow-up in a week for surgery when the swelling improves.  However, patient with unstable fracture,  significant pain and has LUE amputation to be discharged home safely.  So he was admitted for adequate pain control and surgical intervention on 02/23/2020.  CT head, C-spine and L-spine without significant finding.  COVID-19 PCR negative.  Subjective: Seen and examined earlier this morning.  No major events overnight of this morning.  Continues to endorse significant LLE pain.  Also reports pain in his wrist especially with abduction.  Thinks the swelling has improved.  Denies numbness.  Objective: Vitals:   02/19/20 2013 02/19/20 2325 02/20/20 0511 02/20/20 0800  BP: 126/75 116/71 113/70 124/74  Pulse: 66 71 65 72  Resp: 18  18 17   Temp: 98.5 F (36.9 C) 98 F (36.7 C) 98.1 F (36.7 C) 98.1 F (36.7 C)  TempSrc: Oral Oral Oral Oral  SpO2: 93%  95% 92%  Weight:      Height:        Intake/Output Summary (Last 24 hours) at 02/20/2020 1406 Last data filed at 02/20/2020 0511 Gross per 24 hour  Intake --  Output 2175 ml  Net -2175 ml   Filed Weights   02/15/20 1654  Weight: 113.4 kg    Examination:  GENERAL: No apparent distress. Nontoxic.  HEENT: MMM.  Vision and hearing grossly intact.  NECK: Supple.  No apparent JVD.  RESP: On room air.  No IWOB. Good  air movement bilaterally. CVS:  RRR. Heart sounds normal.  ABD/GI/GU: BS present. Soft. Non tender.  MSK/EXT:  Moves extremities.  Ace wrap over LLE from his knee to his foot.  Neurovascular intact.  Tenderness to palpation over the medial aspect of his right wrist.  No swelling.  Appears to have full range of motion in his left wrist. SKIN: no apparent skin lesion or wound NEURO: Awake, alert and oriented appropriately.  No apparent focal neuro deficit. PSYCH: Calm. Normal affect.  Procedures:  None  Microbiology summarized: COVID-19 PCR negative.  Assessment & Plan: Mechanical fall at home Left trimalleolar fracture-heart reduction in ED. Left wrist pain-x-ray without significant finding. -Continue current pain regimen for pain control with bowel regimen. -Continue leg elevation -Surgical intervention tentatively scheduled for 4/22.   Essential hypertension: Normotensive -Continue home lisinopril  Alcohol abuse -CIWA monitoring with Ativan -Continue vitamins -Cessation counseling.  Elevated liver enzymes: Likely due to alcohol.  Resolved.  History of asthma: On DuoNeb at home. -Continue Singulair and as needed DuoNeb  Chronic pain syndrome: On Percocet, gabapentin, Zanaflex -Continue as needed oxycodone, gabapentin and Flexeril, -Add Tylenol 1 g 3 times daily -As needed MiraLAX and Senokot-S  RUQ pain: Seems to have resolved.  CT abdomen without significant finding.  Mild LFT resolved. -Continue monitoring  Class II obesity: Body mass index is 39.16 kg/m. -Encourage lifestyle change to lose weight.  DVT prophylaxis: Subcu Lovenox Code Status: Full code Family Communication: Patient and/or RN. Available if any question.   Discharge barrier: Adequate pain control, unstable fracture and planned surgical intervention Patient is from: Home Final disposition: Likely home after surgery.  Consultants:  Orthopedic surgery   Sch Meds:   Scheduled Meds: . atorvastatin  10 mg Oral q1800  . enoxaparin (LOVENOX) injection  55 mg Subcutaneous Q24H  . folic acid  1 mg Oral Daily  . gabapentin  900 mg Oral BID  . lisinopril  20 mg Oral Daily  . montelukast  10 mg Oral QHS  . multivitamin with minerals  1 tablet Oral Daily  . sertraline  50 mg Oral Daily  . thiamine  100 mg Oral Daily   Or  . thiamine  100 mg Intravenous Daily   Continuous Infusions: PRN Meds:.cyclobenzaprine, ondansetron **OR** ondansetron (ZOFRAN) IV, oxyCODONE  Antimicrobials: Anti-infectives (From admission, onward)   None       I have personally reviewed the following labs and images: CBC: Recent Labs  Lab 02/15/20 1654 02/16/20 0508 02/17/20 0505  WBC 5.4 9.6 7.1  NEUTROABS 2.9  --   --   HGB 16.1 14.5 13.9  HCT 46.4 42.7 41.9  MCV 98.5 100.9* 103.7*  PLT 183 160 146*   BMP &GFR Recent Labs  Lab 02/15/20 1654 02/16/20 0508 02/17/20 0505  NA 139 136 135  K 4.1 4.1 4.2  CL 104 103 99  CO2 19* 22 25  GLUCOSE 104* 117* 116*  BUN 10 9 9   CREATININE 0.95 0.95 1.09  CALCIUM 8.8* 8.2* 8.7*  MG  --  1.9  --   PHOS  --  3.0  --    Estimated Creatinine Clearance: 95.3 mL/min (by C-G formula based on SCr of 1.09 mg/dL). Liver & Pancreas: Recent Labs  Lab 02/15/20 1654 02/16/20 0508 02/17/20 0505  AST 72* 61* 35  ALT 61* 59* 46*  ALKPHOS 74 72 59  BILITOT 0.8 0.8 0.9  PROT 7.0 6.6 6.3*  ALBUMIN 3.9 4.0 3.6   Recent Labs  Lab 02/16/20 1414  LIPASE 23   No results for input(s): AMMONIA in the last 168 hours. Diabetic: No results for input(s): HGBA1C in the last 72 hours. Recent Labs  Lab 02/16/20 0646 02/16/20 1139 02/16/20 1646  GLUCAP 112* 101* 96   Cardiac Enzymes: No results for input(s): CKTOTAL, CKMB, CKMBINDEX, TROPONINI in the last 168 hours. No results for input(s): PROBNP in the last 8760 hours. Coagulation Profile: No results for input(s): INR, PROTIME in the last 168 hours. Thyroid Function  Tests: No results for input(s): TSH, T4TOTAL, FREET4, T3FREE, THYROIDAB in the last 72 hours. Lipid Profile: No results for input(s): CHOL, HDL, LDLCALC, TRIG, CHOLHDL, LDLDIRECT in the last 72 hours. Anemia Panel: No results for input(s): VITAMINB12, FOLATE, FERRITIN, TIBC, IRON, RETICCTPCT in the last 72 hours. Urine analysis:    Component Value Date/Time   COLORURINE YELLOW 02/06/2020 2039   APPEARANCEUR CLEAR 02/06/2020 2039   LABSPEC 1.015 02/06/2020 2039   PHURINE 6.0 02/06/2020 2039   GLUCOSEU NEGATIVE 02/06/2020 2039   GLUCOSEU NEGATIVE 01/31/2015 0847   HGBUR NEGATIVE 02/06/2020 2039   BILIRUBINUR NEGATIVE 02/06/2020 2039   [place] NEGATIVE 02/06/2020 2039   PROTEINUR NEGATIVE 02/06/2020 2039   UROBILINOGEN 0.2 01/31/2015 0847   NITRITE NEGATIVE 02/06/2020 2039   LEUKOCYTESUR NEGATIVE 02/06/2020 2039   Sepsis Labs: Invalid input(s): PROCALCITONIN, LACTICIDVEN  Microbiology: Recent Results (from the past 240 hour(s))  SARS Coronavirus 2  by RT PCR     Status: None   Collection Time: 02/16/20  4:37 AM  Result Value Ref Range Status   SARS Coronavirus 2 NEGATIVE NEGATIVE Final    Comment: (NOTE) Result indicates the ABSENCE of SARS-CoV-2 RNA in the patient specimen.  The lowest concentration of SARS-CoV-2 viral copies this assay can detect in nasopharyngeal swab specimens is 500 copies / mL.  A negative result does not preclude SARS-CoV-2 infection and should not be used as the sole basis for patient management decisions. A negative result may occur with improper specimen collection / handling, submission of a specimen other than nasopharyngeal swab, presence of viral mutation(s) within the areas targeted by this assay, and inadequate number of viral copies (<500 copies / mL) present.  Negative results must be combined with clinical observations, patient history, and epidemiological information.  The expected result is NEGATIVE.  Patient Fact Sheet:   BlogSelections.co.uk   Provider Fact Sheet:  https://lucas.com/   This test is not yet approved or cleared by the [place] FDA and  has been authorized for  detection and/or diagnosis of SARS-CoV-2 by FDA under an Emergency Use Authorization (EUA).  This EUA will remain in effect (meaning this test can be used) for the duration of  the COVID-19 declaration under Section 564(b)(1) of the Act, 21 U.S.C. section 360bbb-3(b)(1), unless the authorization is terminated or revoked sooner Performed at [facility], [place] [address], [place], [place] [address]     Radiology Studies: DG Wrist Complete Right  Result Date: 02/20/2020 CLINICAL DATA:  Right wrist pain since a fall five days ago. EXAM: RIGHT WRIST - COMPLETE 3+ VIEW COMPARISON:  Radiographs dated 07/20/2019 FINDINGS: There is no fracture or dislocation. Slight arthritic changes of the first [facility] joint, stable. IMPRESSION: No acute abnormality. Electronically Signed   By: Lorriane Shire M.D.   On: 02/20/2020 12:54      Oluwatamilore Starnes T. Canova  If 7PM-7AM, please contact night-coverage [email or web address] Password [facility] 02/20/2020, 2:06 PM

## 2020-02-21 LAB — HEMOGLOBIN A1C
Hgb A1c MFr Bld: 5.3 % (ref 4.8–5.6)
Mean Plasma Glucose: 105.41 mg/dL

## 2020-02-21 NOTE — Progress Notes (Addendum)
RE: Daniel Durham Date of Birth: 1966/09/25 Date: 02/21/20   To Whom It May Concern:  Please be advised that the above-named patient will require a short-term nursing home stay - anticipated 30 days or less for rehabilitation and strengthening.  The plan is for return home.

## 2020-02-21 NOTE — Plan of Care (Signed)

## 2020-02-21 NOTE — Progress Notes (Signed)
PROGRESS NOTE  Daniel Durham [id number] DOB: Jan 18, 1966   PCP: Brunetta Jeans, PA-C  Patient is from: Home.  DOA: 02/15/2020 LOS: 2  Brief Narrative / Interim history: 54 year old male with history of chronic back pain, asthma, EtOH use, LUE amputation and HTN presenting after mechanical fall with head impaction and brief LOC, and left ankle fracture as noted on x-ray.    In ED, left ankle x-ray revealed revealed trimalleolar fracture with tiny osseous fragments seen on the medial anterior talar dome.  Patient had reduction in ED.  Orthopedic surgery, Dr. Stann Mainland consulted, and initially recommended outpatient follow-up in a week for surgery when the swelling improves.  However, patient with unstable fracture,  significant pain and has LUE amputation to be discharged home safely.  So he was admitted for adequate pain control and surgical intervention on 02/23/2020.  CT head, C-spine and L-spine without significant finding.  COVID-19 PCR negative.  Subjective: Seen and examined this morning. No major events overnight or this morning. Continue to endorse significant pain in his left ankle despite improvement in swelling. Pain improves with leg elevation. Denies numbness or tingling. Wrist pain improved.  Objective: Vitals:   02/21/20 0306 02/21/20 0759 02/21/20 1015 02/21/20 1230  BP: (!) 123/92 120/79 120/79 123/60  Pulse: 68 64  74  Resp: 17 18  16   Temp: 98 F (36.7 C) (!) 97.5 F (36.4 C)  97.7 F (36.5 C)  TempSrc: Oral Oral  Oral  SpO2: 96% 94%  98%  Weight:      Height:        Intake/Output Summary (Last 24 hours) at 02/21/2020 1603 Last data filed at 02/21/2020 1400 Gross per 24 hour  Intake 600 ml  Output 2625 ml  Net -2025 ml   Filed Weights   02/15/20 1654  Weight: 113.4 kg    Examination:  GENERAL: No acute distress.  Appears well.  HEENT: MMM.  Vision and hearing grossly intact.  NECK: Supple.  No apparent JVD.  RESP: on RA. No IWOB. Good air  movement bilaterally. CVS:  RRR. Heart sounds normal.  ABD/GI/GU: Bowel sounds present. Soft. Non tender.  MSK/EXT:  Moves extremities. Ace wrap over LLE. Neurovascularly intact. SKIN: no apparent skin lesion or wound NEURO: Awake, alert and oriented appropriately.  No apparent focal neuro deficit. PSYCH: Calm. Normal affect.   Procedures:  None  Microbiology summarized: COVID-19 PCR negative.  Assessment & Plan: Mechanical fall at home Left trimalleolar fracture-heart reduction in ED. Left wrist pain-x-ray without significant finding. -Continue current pain regimen for pain control with bowel regimen. -Continue leg elevation -Surgical intervention scheduled for 4/22.   Essential hypertension: Normotensive -Continue home lisinopril  Alcohol abuse -CIWA monitoring with Ativan -Continue vitamins -Cessation counseling.  Elevated liver enzymes: Likely due to alcohol.  Resolved.  History of asthma: On DuoNeb at home. -Continue Singulair and as needed DuoNeb  Chronic pain syndrome: On Percocet, gabapentin, Zanaflex -Continue as needed oxycodone, gabapentin and Flexeril, -Tylenol 1 g 3 times daily -As needed MiraLAX and Senokot-S  RUQ pain: Seems to have resolved.  CT abdomen without significant finding.  Mild LFT resolved. -Continue monitoring  Class II obesity: Body mass index is 39.16 kg/m. -Encourage lifestyle change to lose weight.                DVT prophylaxis: Subcu Lovenox Code Status: Full code Family Communication: Patient and/or RN. Available if any question.   Discharge barrier: Adequate pain control, unstable fracture and planned surgical intervention Patient  is from: Home Final disposition: Likely SNF after surgery  Consultants:  Orthopedic surgery   Sch Meds:  Scheduled Meds: . acetaminophen  1,000 mg Oral Q8H  . atorvastatin  10 mg Oral q1800  . enoxaparin (LOVENOX) injection  55 mg Subcutaneous Q24H  . folic acid  1 mg Oral Daily    . gabapentin  900 mg Oral BID  . lisinopril  20 mg Oral Daily  . montelukast  10 mg Oral QHS  . multivitamin with minerals  1 tablet Oral Daily  . sertraline  50 mg Oral Daily  . thiamine  100 mg Oral Daily   Or  . thiamine  100 mg Intravenous Daily   Continuous Infusions: PRN Meds:.cyclobenzaprine, ondansetron **OR** ondansetron (ZOFRAN) IV, oxyCODONE, polyethylene glycol, senna-docusate  Antimicrobials: Anti-infectives (From admission, onward)   None       I have personally reviewed the following labs and images: CBC: Recent Labs  Lab 02/15/20 1654 02/16/20 0508 02/17/20 0505  WBC 5.4 9.6 7.1  NEUTROABS 2.9  --   --   HGB 16.1 14.5 13.9  HCT 46.4 42.7 41.9  MCV 98.5 100.9* 103.7*  PLT 183 160 146*   BMP &GFR Recent Labs  Lab 02/15/20 1654 02/16/20 0508 02/17/20 0505  NA 139 136 135  K 4.1 4.1 4.2  CL 104 103 99  CO2 19* 22 25  GLUCOSE 104* 117* 116*  BUN 10 9 9   CREATININE 0.95 0.95 1.09  CALCIUM 8.8* 8.2* 8.7*  MG  --  1.9  --   PHOS  --  3.0  --    Estimated Creatinine Clearance: 95.3 mL/min (by C-G formula based on SCr of 1.09 mg/dL). Liver & Pancreas: Recent Labs  Lab 02/15/20 1654 02/16/20 0508 02/17/20 0505  AST 72* 61* 35  ALT 61* 59* 46*  ALKPHOS 74 72 59  BILITOT 0.8 0.8 0.9  PROT 7.0 6.6 6.3*  ALBUMIN 3.9 4.0 3.6   Recent Labs  Lab 02/16/20 1414  LIPASE 23   No results for input(s): AMMONIA in the last 168 hours. Diabetic: Recent Labs    02/21/20 0214  HGBA1C 5.3   Recent Labs  Lab 02/16/20 0646 02/16/20 1139 02/16/20 1646  GLUCAP 112* 101* 96   Cardiac Enzymes: No results for input(s): CKTOTAL, CKMB, CKMBINDEX, TROPONINI in the last 168 hours. No results for input(s): PROBNP in the last 8760 hours. Coagulation Profile: No results for input(s): INR, PROTIME in the last 168 hours. Thyroid Function Tests: No results for input(s): TSH, T4TOTAL, FREET4, T3FREE, THYROIDAB in the last 72 hours. Lipid Profile: No  results for input(s): CHOL, HDL, LDLCALC, TRIG, CHOLHDL, LDLDIRECT in the last 72 hours. Anemia Panel: No results for input(s): VITAMINB12, FOLATE, FERRITIN, TIBC, IRON, RETICCTPCT in the last 72 hours. Urine analysis:    Component Value Date/Time   COLORURINE YELLOW 02/06/2020 2039   APPEARANCEUR CLEAR 02/06/2020 2039   LABSPEC 1.015 02/06/2020 2039   PHURINE 6.0 02/06/2020 2039   GLUCOSEU NEGATIVE 02/06/2020 2039   GLUCOSEU NEGATIVE 01/31/2015 0847   HGBUR NEGATIVE 02/06/2020 2039   BILIRUBINUR NEGATIVE 02/06/2020 2039   [place] NEGATIVE 02/06/2020 2039   PROTEINUR NEGATIVE 02/06/2020 2039   UROBILINOGEN 0.2 01/31/2015 0847   NITRITE NEGATIVE 02/06/2020 2039   LEUKOCYTESUR NEGATIVE 02/06/2020 2039   Sepsis Labs: Invalid input(s): PROCALCITONIN, [place]  Microbiology: Recent Results (from the past 240 hour(s))  SARS Coronavirus 2 by RT PCR     Status: None   Collection Time: 02/16/20  4:37 AM  Result Value Ref Range Status   SARS Coronavirus 2 NEGATIVE NEGATIVE Final    Comment: (NOTE) Result indicates the ABSENCE of SARS-CoV-2 RNA in the patient specimen.  The lowest concentration of SARS-CoV-2 viral copies this assay can detect in nasopharyngeal swab specimens is 500 copies / mL.  A negative result does not preclude SARS-CoV-2 infection and should not be used as the sole basis for patient management decisions. A negative result may occur with improper specimen collection / handling, submission of a specimen other than nasopharyngeal swab, presence of viral mutation(s) within the areas targeted by this assay, and inadequate number of viral copies (<500 copies / mL) present.  Negative results must be combined with clinical observations, patient history, and epidemiological information.  The expected result is NEGATIVE.  Patient Fact Sheet:  BlogSelections.co.uk   Provider Fact Sheet:  https://lucas.com/   This test is  not yet approved or cleared by the [place] FDA and  has been authorized for  detection and/or diagnosis of SARS-CoV-2 by FDA under an Emergency Use Authorization (EUA).  This EUA will remain in effect (meaning this test can be used) for the duration of  the COVID-19 declaration under Section 564(b)(1) of the Act, 21 U.S.C. section 360bbb-3(b)(1), unless the authorization is terminated or revoked sooner Performed at [facility], [place] [address] [place], [place] [address]     Radiology Studies: No results found.    Faiga Stones T. Vanderbilt  If 7PM-7AM, please contact night-coverage [email or web address] Password [place] 02/21/2020, 4:03 PM

## 2020-02-22 LAB — CBC WITH DIFFERENTIAL/PLATELET
Abs Immature Granulocytes: 0.03 10*3/uL (ref 0.00–0.07)
Basophils Absolute: 0.1 10*3/uL (ref 0.0–0.1)
Basophils Relative: 1 %
Eosinophils Absolute: 0.2 10*3/uL (ref 0.0–0.5)
Eosinophils Relative: 3 %
HCT: 44.3 % (ref 39.0–52.0)
Hemoglobin: 14.9 g/dL (ref 13.0–17.0)
Immature Granulocytes: 1 %
Lymphocytes Relative: 26 %
Lymphs Abs: 1.5 10*3/uL (ref 0.7–4.0)
MCH: 34 pg (ref 26.0–34.0)
MCHC: 33.6 g/dL (ref 30.0–36.0)
MCV: 101.1 fL — ABNORMAL HIGH (ref 80.0–100.0)
Monocytes Absolute: 1 10*3/uL (ref 0.1–1.0)
Monocytes Relative: 17 %
Neutro Abs: 2.9 10*3/uL (ref 1.7–7.7)
Neutrophils Relative %: 52 %
Platelets: 188 10*3/uL (ref 150–400)
RBC: 4.38 MIL/uL (ref 4.22–5.81)
RDW: 12.1 % (ref 11.5–15.5)
WBC: 5.6 10*3/uL (ref 4.0–10.5)
nRBC: 0 % (ref 0.0–0.2)

## 2020-02-22 LAB — COMPREHENSIVE METABOLIC PANEL
ALT: 74 U/L — ABNORMAL HIGH (ref 0–44)
AST: 63 U/L — ABNORMAL HIGH (ref 15–41)
Albumin: 3.6 g/dL (ref 3.5–5.0)
Alkaline Phosphatase: 76 U/L (ref 38–126)
Anion gap: 11 (ref 5–15)
BUN: 13 mg/dL (ref 6–20)
CO2: 20 mmol/L — ABNORMAL LOW (ref 22–32)
Calcium: 8.7 mg/dL — ABNORMAL LOW (ref 8.9–10.3)
Chloride: 102 mmol/L (ref 98–111)
Creatinine, Ser: 0.98 mg/dL (ref 0.61–1.24)
GFR calc Af Amer: >60 mL/min (ref >60)
GFR calc non Af Amer: >60 mL/min (ref >60)
Glucose, Bld: 129 mg/dL — ABNORMAL HIGH (ref 70–99)
Potassium: 4.3 mmol/L (ref 3.5–5.1)
Sodium: 133 mmol/L — ABNORMAL LOW (ref 135–145)
Total Bilirubin: 0.5 mg/dL (ref 0.3–1.2)
Total Protein: 6.8 g/dL (ref 6.5–8.1)

## 2020-02-22 MED ORDER — OXYCODONE HCL 5 MG PO TABS
15.0000 mg | ORAL_TABLET | ORAL | Status: DC | PRN
  Administered 2020-02-22 – 2020-02-28 (×29): 15 mg via ORAL
  Filled 2020-02-22 (×29): qty 3

## 2020-02-22 MED ORDER — KETOROLAC TROMETHAMINE 30 MG/ML IJ SOLN
30.0000 mg | Freq: Four times a day (QID) | INTRAMUSCULAR | Status: AC
  Administered 2020-02-22 – 2020-02-23 (×4): 30 mg via INTRAVENOUS
  Filled 2020-02-22 (×4): qty 1

## 2020-02-22 MED ORDER — CEFAZOLIN SODIUM-DEXTROSE 2-4 GM/100ML-% IV SOLN
2.0000 g | INTRAVENOUS | Status: AC
  Administered 2020-02-23: 09:00:00 2 g via INTRAVENOUS
  Filled 2020-02-22: qty 100

## 2020-02-22 MED ORDER — TRANEXAMIC ACID-NACL 1000-0.7 MG/100ML-% IV SOLN
1000.0000 mg | INTRAVENOUS | Status: AC
  Administered 2020-02-23: 1000 mg via INTRAVENOUS
  Filled 2020-02-22: qty 100

## 2020-02-22 MED ORDER — MORPHINE SULFATE (PF) 4 MG/ML IV SOLN
4.0000 mg | INTRAVENOUS | Status: DC | PRN
  Administered 2020-02-22 – 2020-02-27 (×10): 4 mg via INTRAVENOUS
  Filled 2020-02-22 (×10): qty 1

## 2020-02-22 MED ORDER — POLYETHYLENE GLYCOL 3350 17 G PO PACK
17.0000 g | PACK | Freq: Two times a day (BID) | ORAL | Status: DC
  Administered 2020-02-22 – 2020-02-25 (×6): 17 g via ORAL
  Filled 2020-02-22 (×11): qty 1

## 2020-02-22 MED ORDER — SENNOSIDES-DOCUSATE SODIUM 8.6-50 MG PO TABS
2.0000 | ORAL_TABLET | Freq: Every day | ORAL | Status: DC
  Administered 2020-02-22 – 2020-02-27 (×6): 2 via ORAL
  Filled 2020-02-22 (×6): qty 2

## 2020-02-22 NOTE — Progress Notes (Signed)
Inpatient Rehab Admissions Coordinator Note:   Per PT recommendations, pt was screened for CIR candidacy by Shann Medal, PT, DPT.  At this time we will follow for post surgical recommendations.  Please contact me with questions.   Shann Medal, PT, DPT [phone] 02/22/20 5:18 PM

## 2020-02-22 NOTE — Plan of Care (Signed)

## 2020-02-22 NOTE — Progress Notes (Signed)
Physical Therapy Treatment Patient Details Name: Daniel Durham MRN: [id number] DOB: 06-09-1966 Today's Date: 02/22/2020    History of Present Illness Pt is a 54 y/o male admitted 02/15/20 after fall sustaining L trimalleolar fx, pt reportedly slip and hit his head with brief LOC. S/p L ankle reduction and splinting in ED. Plan for potential sx 4/22 for fixation. PMH includes alcohol use, COVID, HTN, traumatic L forearm amputation.   PT Comments    Pt progressing with mobility. Initiated transfer and gait training with LUE platform RW today, pt hopeful this will be a good option for him. Pt hesitant to participate, awaiting sx tomorrow and worried about LLE swelling. Encouraged more frequent elevation (pt tends to sit EOB prolonged periods of time with LE in dependent position) and gentle ROM. Recommend intensive CIR-level therapies to maximize functional mobility and independence, but unsure pt has consistent assist available upon d/c. If not, continue to recommend SNF-level therapies.   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 Restrictions Weight Bearing Restrictions: Yes LLE Weight Bearing: Non weight bearing    Mobility  Bed Mobility Overal bed mobility: Modified Independent Bed Mobility: Supine to Sit;Sit to Supine     Supine to sit: Modified independent (Device/Increase time)     General bed mobility comments: Mod indep to sit with bed flat, use of bed rail; mod indep for supine<>sit with HOB elevated  Transfers Overall transfer level: Needs assistance Equipment used: Left platform walker   Sit to Stand: Min guard         General transfer  comment: Close min guard for balance due to instability; at times with difficulty maintaining LLE NWB precautions requiring cues  Ambulation/Gait Ambulation/Gait assistance: Min guard;Min assist Gait Distance (Feet): 2 Feet Assistive device: Left platform walker       General Gait Details: Took a few hops to trial LUE platform RW, 1x minA to prevent LOB; pt with difficulty taking complete hop on R foot, but appreciates use of platform walker for stability. Limited by pain and fatigue; hesitant to keep LLE in dependent position for long due to pain/swelling   Stairs             Wheelchair Mobility    Modified Rankin (Stroke Patients Only)       Balance Overall balance assessment: Needs assistance Sitting-balance support: Single extremity supported Sitting balance-Leahy Scale: Fair     Standing balance support: Single extremity supported;Bilateral upper extremity supported Standing balance-Leahy Scale: Poor Standing balance comment: Reliant on UE support                            Cognition Arousal/Alertness: Awake/alert Behavior During Therapy: WFL for tasks assessed/performed Overall Cognitive Status: No family/caregiver present to determine baseline cognitive functioning                                 General Comments: Some tangential with speech contributing to poor attention; unsure baseline cognition. Potential post-concussive symptoms as pt had fall hitting head with brief LOC      Exercises Other Exercises Other Exercises: BLE SLR, modified heel slides (foot off bed)    General  Comments General comments (skin integrity, edema, etc.): Continued discussion of DME options - educ that rollator and eva walker not best choices because of multiple wheels not meant to be used as "crutch" - pt appreciated trial with LUE platform RW      Pertinent Vitals/Pain Pain Assessment: Faces Faces Pain Scale: Hurts little more Pain Location: L  ankle  Pain Descriptors / Indicators: Sharp;Heaviness Pain Intervention(s): Monitored during session;Repositioned    Home Living                      Prior Function            PT Goals (current goals can now be found in the care plan section) Progress towards PT goals: Progressing toward goals    Frequency    Min 3X/week      PT Plan Current plan remains appropriate    Co-evaluation              AM-PAC PT "6 Clicks" Mobility   Outcome Measure  Help needed turning from your back to your side while in a flat bed without using bedrails?: None Help needed moving from lying on your back to sitting on the side of a flat bed without using bedrails?: A Little Help needed moving to and from a bed to a chair (including a wheelchair)?: A Little Help needed standing up from a chair using your arms (e.g., wheelchair or bedside chair)?: A Little Help needed to walk in hospital room?: A Little Help needed climbing 3-5 steps with a railing? : Total 6 Click Score: 17    End of Session   Activity Tolerance: Patient tolerated treatment well Patient left: in bed;with call bell/phone within reach Nurse Communication: Mobility status PT Visit Diagnosis: Difficulty in walking, not elsewhere classified (R26.2);Pain Pain - Right/Left: Left Pain - part of body: Ankle and joints of foot     Time: 1331-1356 PT Time Calculation (min) (ACUTE ONLY): 25 min  Charges:  $Therapeutic Exercise: 8-22 mins $Therapeutic Activity: 8-22 mins                    Mabeline Caras, PT, DPT Acute Rehabilitation Services  Pager [phone] Office [place] 02/22/2020, 2:21 PM

## 2020-02-22 NOTE — TOC Progression Note (Signed)
Transition of Care [facility]) - Progression Note    Patient Details  Name: Daniel Durham MRN: [id number] Date of Birth: 07-Feb-1966  Transition of Care [facility]) CM/SW [place], LCSW Phone Number: 02/22/2020, 4:26 PM  Clinical Narrative:    Pasrr received: [id number]   Expected Discharge Plan: [place] Barriers to Discharge: Continued Medical Work up  Expected Discharge Plan and Services Expected Discharge Plan: [place]                                               Social Determinants of Health (SDOH) Interventions    Readmission Risk Interventions No flowsheet data found.

## 2020-02-22 NOTE — Progress Notes (Signed)
PROGRESS NOTE    Daniel Durham  [id number] DOB: 01/17/1966 DOA: 02/15/2020 PCP: Brunetta Jeans, PA-C   Chief Complaint  Patient presents with  . Fall    Brief Narrative:  54 year old male with history of chronic back pain, asthma, EtOH use, LUE amputation and HTN presenting after mechanical fall with head impaction and brief LOC, and left ankle fracture as noted on x-ray.    In ED, left ankle x-ray revealed revealed trimalleolar fracture with tiny osseous fragments seen on the medial anterior talar dome.  Patient had reduction in ED.  Orthopedic surgery, Dr. Stann Mainland consulted, and initially recommended outpatient follow-up in [place] week for surgery when the swelling improves.  However, patient with unstable fracture,  significant pain and has LUE amputation to be discharged home safely.  So he was admitted for adequate pain control and surgical intervention on 02/23/2020.  CT head, C-spine and L-spine without significant finding.  COVID-19 PCR negative.  Assessment & Plan:   Principal Problem:   Ankle fracture, bimalleolar, closed, left, initial encounter Active Problems:   HTN (hypertension)   Closed left ankle fracture  Mechanical fall at home Left trimalleolar fracture Left wrist pain-x-ray without significant finding. - s/p reduction in the ED -Continue current pain regimen for pain control with bowel regimen. -Continue leg elevation -Surgical intervention scheduled for 4/22. -Pain management  Essential hypertension: Normotensive -Continue home lisinopril - hold prior to surgery  Alcohol abuse -CIWA monitoring with Ativan -Continue vitamins -Cessation counseling.  Elevated liver enzymes: Likely due to alcohol.  Resolved.  History of asthma: On DuoNeb at home. -Continue Singulair and as needed DuoNeb  Chronic pain syndrome: On Percocet, gabapentin, Zanaflex -Continue as needed oxycodone, gabapentin and Flexeril (IV morphine for breakthrough) -Tylenol 1 g  3 times daily -As needed MiraLAX and Senokot-S  RUQ pain: Seems to have resolved.  CT abdomen without significant finding.  Mild LFT resolved. -Continue monitoring -surgery rec f/u outpatient in clinic for elective outpatient gallbladder  Class II obesity: Body mass index is 39.16 kg/m. -Encourage lifestyle change to lose weight.  DVT prophylaxis: SCD Code Status: full Family Communication: none at bedside Disposition:   Status is: Inpatient  Remains inpatient appropriate because:IV treatments appropriate due to intensity of illness or inability to take PO, Inpatient level of care appropriate due to severity of illness and plan for surgery 4/22   Dispo: The patient is from: Home              Anticipated d/c is to: Home              Anticipated d/c date is: 2 days              Patient currently is not medically stable to d/c.   Consultants:   Ortho  surgery  Procedures: Reduction in ED  Antimicrobials:  Anti-infectives (From admission, onward)   None     Subjective: C/o pain  Objective: Vitals:   02/21/20 2039 02/22/20 0359 02/22/20 0912 02/22/20 1430  BP: 140/65 130/71 132/90 119/69  Pulse: 69 64 76 68  Resp: 17 18 17 17   Temp: 97.8 F (36.6 C) 97.8 F (36.6 C) 97.9 F (36.6 C) 97.7 F (36.5 C)  TempSrc: Oral Oral Oral Oral  SpO2: 93% 92% 96% 94%  Weight:      Height:        Intake/Output Summary (Last 24 hours) at 02/22/2020 1918 Last data filed at 02/22/2020 0900 Gross per 24 hour  Intake 240 ml  Output  1600 ml  Net -1360 ml   Filed Weights   02/15/20 1654  Weight: 113.4 kg    Examination:  General exam: Appears calm and comfortable  Respiratory system: Clear to auscultation. Respiratory effort normal. Cardiovascular system: S1 & S2 heard, RRR.  Gastrointestinal system: Abdomen is nondistended, soft and nontender.  Central nervous system: Alert and oriented. No focal neurological deficits. Extremities: LLE in splint Skin: No rashes,  lesions or ulcers Psychiatry: Judgement and insight appear normal. Mood & affect appropriate.     Data Reviewed: I have personally reviewed following labs and imaging studies  CBC: Recent Labs  Lab 02/16/20 0508 02/17/20 0505 02/22/20 0824  WBC 9.6 7.1 5.6  NEUTROABS  --   --  2.9  HGB 14.5 13.9 14.9  HCT 42.7 41.9 44.3  MCV 100.9* 103.7* 101.1*  PLT 160 146* 0000000    Basic Metabolic Panel: Recent Labs  Lab 02/16/20 0508 02/17/20 0505 02/22/20 0824  NA 136 135 133*  K 4.1 4.2 4.3  CL 103 99 102  CO2 22 25 20*  GLUCOSE 117* 116* 129*  BUN 9 9 13   CREATININE 0.95 1.09 0.98  CALCIUM 8.2* 8.7* 8.7*  MG 1.9  --   --   PHOS 3.0  --   --     GFR: Estimated Creatinine Clearance: 106 mL/min (by C-G formula based on SCr of 0.98 mg/dL).  Liver Function Tests: Recent Labs  Lab 02/16/20 0508 02/17/20 0505 02/22/20 0824  AST 61* 35 63*  ALT 59* 46* 74*  ALKPHOS 72 59 76  BILITOT 0.8 0.9 0.5  PROT 6.6 6.3* 6.8  ALBUMIN 4.0 3.6 3.6    CBG: Recent Labs  Lab 02/16/20 0646 02/16/20 1139 02/16/20 1646  GLUCAP 112* 101* 96     Recent Results (from the past 240 hour(s))  SARS Coronavirus 2 by RT PCR     Status: None   Collection Time: 02/16/20  4:37 AM  Result Value Ref Range Status   SARS Coronavirus 2 NEGATIVE NEGATIVE Final    Comment: (NOTE) Result indicates the ABSENCE of SARS-CoV-2 RNA in the patient specimen.  The lowest concentration of SARS-CoV-2 viral copies this assay can detect in nasopharyngeal swab specimens is 500 copies / mL.  Azriella Mattia negative result does not preclude SARS-CoV-2 infection and should not be used as the sole basis for patient management decisions. Micheal Murad negative result may occur with improper specimen collection / handling, submission of Sal [place]at[place] specimen other than nasopharyngeal swab, presence of viral mutation(s) within the areas targeted by this assay, and inadequate number of viral copies (<500 copies / mL) present.  Negative results must  be combined with clinical observations, patient history, and epidemiological information.  The expected result is NEGATIVE.  Patient Fact Sheet:  BlogSelections.co.uk   Provider Fact Sheet:  https://lucas.com/   This test is not yet approved or cleared by the [place] FDA and  has been authorized for  detection and/or diagnosis of SARS-CoV-2 by FDA under an Emergency Use Authorization (EUA).  This EUA will remain in effect (meaning this test can be used) for the duration of  the COVID-19 declaration under Section 564(b)(1) of the Act, 21 U.S.C. section 360bbb-3(b)(1), unless the authorization is terminated or revoked sooner Performed at [facility], [place] [address] [place], [place] [address]          Radiology Studies: No results found.      Scheduled Meds: . acetaminophen  1,000 mg Oral Q8H  . atorvastatin  10 mg Oral q1800  . enoxaparin (LOVENOX) injection  55 mg Subcutaneous Q24H  . folic acid  1 mg Oral Daily  . gabapentin  900 mg Oral BID  . ketorolac  30 mg Intravenous Q6H  . montelukast  10 mg Oral QHS  . multivitamin with minerals  1 tablet Oral Daily  . polyethylene glycol  17 g Oral BID  . senna-docusate  2 tablet Oral QHS  . sertraline  50 mg Oral Daily  . thiamine  100 mg Oral Daily   Or  . thiamine  100 mg Intravenous Daily   Continuous Infusions:   LOS: 3 days    Time spent: over 30 min    Fayrene Helper, MD [facility]   To contact the attending provider between 7A-7P or the covering provider during after hours 7P-7A, please log into the web site [email or web address] and access using universal [place] password for that web site. If you do not have the password, please call the hospital operator.  02/22/2020, 7:18 PM

## 2020-02-22 NOTE — Progress Notes (Signed)
Mechele Claude, PA was contacted and was informed that the patient is upset because he has not spoken with anyone from ortho regarding his surgery tomorrow.    Dr Stann Mainland has returned the phone call

## 2020-02-23 ENCOUNTER — Inpatient Hospital Stay (HOSPITAL_COMMUNITY): Payer: Medicare HMO | Admitting: Anesthesiology

## 2020-02-23 ENCOUNTER — Encounter (HOSPITAL_COMMUNITY)
Admission: EM | Disposition: A | Discharge status: Home Health | Payer: Self-pay | Source: Home / Self Care | Attending: Family Medicine

## 2020-02-23 ENCOUNTER — Inpatient Hospital Stay (HOSPITAL_COMMUNITY): Payer: Medicare HMO

## 2020-02-23 ENCOUNTER — Encounter (HOSPITAL_COMMUNITY): Payer: Self-pay | Admitting: Internal Medicine

## 2020-02-23 HISTORY — PX: ORIF ANKLE FRACTURE: SHX5408

## 2020-02-23 LAB — COMPREHENSIVE METABOLIC PANEL
ALT: 68 U/L — ABNORMAL HIGH (ref 0–44)
AST: 54 U/L — ABNORMAL HIGH (ref 15–41)
Albumin: 3.4 g/dL — ABNORMAL LOW (ref 3.5–5.0)
Alkaline Phosphatase: 79 U/L (ref 38–126)
Anion gap: 9 (ref 5–15)
BUN: 24 mg/dL — ABNORMAL HIGH (ref 6–20)
CO2: 24 mmol/L (ref 22–32)
Calcium: 8.8 mg/dL — ABNORMAL LOW (ref 8.9–10.3)
Chloride: 101 mmol/L (ref 98–111)
Creatinine, Ser: 1.32 mg/dL — ABNORMAL HIGH (ref 0.61–1.24)
GFR calc Af Amer: >60 mL/min (ref >60)
GFR calc non Af Amer: >60 mL/min (ref >60)
Glucose, Bld: 120 mg/dL — ABNORMAL HIGH (ref 70–99)
Potassium: 4.5 mmol/L (ref 3.5–5.1)
Sodium: 134 mmol/L — ABNORMAL LOW (ref 135–145)
Total Bilirubin: 0.6 mg/dL (ref 0.3–1.2)
Total Protein: 6.3 g/dL — ABNORMAL LOW (ref 6.5–8.1)

## 2020-02-23 LAB — CBC WITH DIFFERENTIAL/PLATELET
Abs Immature Granulocytes: 0.03 10*3/uL (ref 0.00–0.07)
Basophils Absolute: 0 10*3/uL (ref 0.0–0.1)
Basophils Relative: 1 %
Eosinophils Absolute: 0.3 10*3/uL (ref 0.0–0.5)
Eosinophils Relative: 4 %
HCT: 41.8 % (ref 39.0–52.0)
Hemoglobin: 13.9 g/dL (ref 13.0–17.0)
Immature Granulocytes: 1 %
Lymphocytes Relative: 28 %
Lymphs Abs: 1.7 10*3/uL (ref 0.7–4.0)
MCH: 34.2 pg — ABNORMAL HIGH (ref 26.0–34.0)
MCHC: 33.3 g/dL (ref 30.0–36.0)
MCV: 102.7 fL — ABNORMAL HIGH (ref 80.0–100.0)
Monocytes Absolute: 1.4 10*3/uL — ABNORMAL HIGH (ref 0.1–1.0)
Monocytes Relative: 22 %
Neutro Abs: 2.7 10*3/uL (ref 1.7–7.7)
Neutrophils Relative %: 44 %
Platelets: 203 10*3/uL (ref 150–400)
RBC: 4.07 MIL/uL — ABNORMAL LOW (ref 4.22–5.81)
RDW: 12.2 % (ref 11.5–15.5)
WBC: 6 10*3/uL (ref 4.0–10.5)
nRBC: 0 % (ref 0.0–0.2)

## 2020-02-23 LAB — PHOSPHORUS: Phosphorus: 3.8 mg/dL (ref 2.5–4.6)

## 2020-02-23 LAB — SURGICAL PCR SCREEN
MRSA, PCR: NEGATIVE
Staphylococcus aureus: POSITIVE — AB

## 2020-02-23 LAB — MAGNESIUM: Magnesium: 2.5 mg/dL — ABNORMAL HIGH (ref 1.7–2.4)

## 2020-02-23 SURGERY — OPEN REDUCTION INTERNAL FIXATION (ORIF) ANKLE FRACTURE
Anesthesia: General | Site: Ankle | Laterality: Left

## 2020-02-23 MED ORDER — OXYCODONE HCL 5 MG/5ML PO SOLN: 5.0000 mg | Freq: Once | ORAL | Status: DC | PRN

## 2020-02-23 MED ORDER — MIDAZOLAM HCL 5 MG/5ML IJ SOLN
INTRAMUSCULAR | Status: DC | PRN
  Administered 2020-02-23: 2 mg via INTRAVENOUS

## 2020-02-23 MED ORDER — FENTANYL CITRATE (PF) 100 MCG/2ML IJ SOLN
INTRAMUSCULAR | Status: DC | PRN
  Administered 2020-02-23: 100 ug via INTRAVENOUS

## 2020-02-23 MED ORDER — METOCLOPRAMIDE HCL 5 MG PO TABS: 5.0000 mg | ORAL_TABLET | Freq: Three times a day (TID) | ORAL | Status: DC | PRN

## 2020-02-23 MED ORDER — BUPIVACAINE HCL (PF) 0.25 % IJ SOLN
INTRAMUSCULAR | Status: DC | PRN
  Administered 2020-02-23: 10 mL

## 2020-02-23 MED ORDER — DEXAMETHASONE SODIUM PHOSPHATE 10 MG/ML IJ SOLN
INTRAMUSCULAR | Status: AC
  Filled 2020-02-23: qty 1

## 2020-02-23 MED ORDER — CEFAZOLIN SODIUM-DEXTROSE 1-4 GM/50ML-% IV SOLN
1.0000 g | Freq: Four times a day (QID) | INTRAVENOUS | Status: AC
  Administered 2020-02-23 (×3): 1 g via INTRAVENOUS
  Filled 2020-02-23 (×3): qty 50

## 2020-02-23 MED ORDER — MIDAZOLAM HCL 2 MG/2ML IJ SOLN
INTRAMUSCULAR | Status: AC
  Filled 2020-02-23: qty 2

## 2020-02-23 MED ORDER — ACETAMINOPHEN 160 MG/5ML PO SOLN: 325.0000 mg | ORAL | Status: DC | PRN

## 2020-02-23 MED ORDER — DOCUSATE SODIUM 100 MG PO CAPS
100.0000 mg | ORAL_CAPSULE | Freq: Two times a day (BID) | ORAL | Status: DC
  Administered 2020-02-23 – 2020-02-28 (×11): 100 mg via ORAL
  Filled 2020-02-23 (×10): qty 1

## 2020-02-23 MED ORDER — LACTATED RINGERS IV SOLN: INTRAVENOUS | Status: AC

## 2020-02-23 MED ORDER — OXYCODONE HCL 5 MG PO TABS: 5.0000 mg | ORAL_TABLET | Freq: Once | ORAL | Status: DC | PRN

## 2020-02-23 MED ORDER — METOCLOPRAMIDE HCL 5 MG/ML IJ SOLN: 5.0000 mg | Freq: Three times a day (TID) | INTRAMUSCULAR | Status: DC | PRN

## 2020-02-23 MED ORDER — ONDANSETRON HCL 4 MG/2ML IJ SOLN: 4.0000 mg | Freq: Once | INTRAMUSCULAR | Status: DC | PRN

## 2020-02-23 MED ORDER — FENTANYL CITRATE (PF) 250 MCG/5ML IJ SOLN
INTRAMUSCULAR | Status: AC
  Filled 2020-02-23: qty 5

## 2020-02-23 MED ORDER — ONDANSETRON HCL 4 MG/2ML IJ SOLN
INTRAMUSCULAR | Status: AC
  Filled 2020-02-23: qty 2

## 2020-02-23 MED ORDER — ACETAMINOPHEN 325 MG PO TABS: 325.0000 mg | ORAL_TABLET | ORAL | Status: DC | PRN

## 2020-02-23 MED ORDER — MEPERIDINE HCL 25 MG/ML IJ SOLN: 6.2500 mg | INTRAMUSCULAR | Status: DC | PRN

## 2020-02-23 MED ORDER — LACTATED RINGERS IV SOLN: INTRAVENOUS | Status: DC

## 2020-02-23 MED ORDER — ONDANSETRON HCL 4 MG PO TABS: 4.0000 mg | ORAL_TABLET | Freq: Four times a day (QID) | ORAL | Status: DC | PRN

## 2020-02-23 MED ORDER — DEXAMETHASONE SODIUM PHOSPHATE 10 MG/ML IJ SOLN
INTRAMUSCULAR | Status: DC | PRN
  Administered 2020-02-23: 10 mg via INTRAVENOUS

## 2020-02-23 MED ORDER — KETAMINE HCL 10 MG/ML IJ SOLN
INTRAMUSCULAR | Status: DC | PRN
  Administered 2020-02-23: 30 mg via INTRAVENOUS

## 2020-02-23 MED ORDER — ROPIVACAINE HCL 7.5 MG/ML IJ SOLN
INTRAMUSCULAR | Status: DC | PRN
  Administered 2020-02-23: 15 mL via PERINEURAL

## 2020-02-23 MED ORDER — ONDANSETRON HCL 4 MG/2ML IJ SOLN: 4.0000 mg | Freq: Four times a day (QID) | INTRAMUSCULAR | Status: DC | PRN

## 2020-02-23 MED ORDER — PHENYLEPHRINE HCL-NACL 10-0.9 MG/250ML-% IV SOLN
INTRAVENOUS | Status: DC | PRN
  Administered 2020-02-23: 50 ug/min via INTRAVENOUS

## 2020-02-23 MED ORDER — LIDOCAINE 2% (20 MG/ML) 5 ML SYRINGE
INTRAMUSCULAR | Status: DC | PRN
  Administered 2020-02-23: 100 mg via INTRAVENOUS

## 2020-02-23 MED ORDER — FENTANYL CITRATE (PF) 100 MCG/2ML IJ SOLN: 25.0000 ug | INTRAMUSCULAR | Status: DC | PRN

## 2020-02-23 MED ORDER — 0.9 % SODIUM CHLORIDE (POUR BTL) OPTIME
TOPICAL | Status: DC | PRN
  Administered 2020-02-23: 1000 mL

## 2020-02-23 MED ORDER — LIDOCAINE 2% (20 MG/ML) 5 ML SYRINGE
INTRAMUSCULAR | Status: AC
  Filled 2020-02-23: qty 5

## 2020-02-23 MED ORDER — ONDANSETRON HCL 4 MG/2ML IJ SOLN
INTRAMUSCULAR | Status: DC | PRN
  Administered 2020-02-23: 4 mg via INTRAVENOUS

## 2020-02-23 MED ORDER — BUPIVACAINE HCL (PF) 0.25 % IJ SOLN
INTRAMUSCULAR | Status: AC
  Filled 2020-02-23: qty 30

## 2020-02-23 MED ORDER — PROPOFOL 10 MG/ML IV BOLUS
INTRAVENOUS | Status: AC
  Filled 2020-02-23: qty 20

## 2020-02-23 MED ORDER — PROPOFOL 10 MG/ML IV BOLUS
INTRAVENOUS | Status: DC | PRN
  Administered 2020-02-23: 200 mg via INTRAVENOUS

## 2020-02-23 MED ORDER — KETAMINE HCL 50 MG/5ML IJ SOSY
PREFILLED_SYRINGE | INTRAMUSCULAR | Status: AC
  Filled 2020-02-23: qty 5

## 2020-02-23 MED ORDER — PHENYLEPHRINE 40 MCG/ML (10ML) SYRINGE FOR IV PUSH (FOR BLOOD PRESSURE SUPPORT)
PREFILLED_SYRINGE | INTRAVENOUS | Status: DC | PRN
  Administered 2020-02-23 (×2): 120 ug via INTRAVENOUS
  Administered 2020-02-23 (×2): 80 ug via INTRAVENOUS

## 2020-02-23 SURGICAL SUPPLY — 68 items
ALCOHOL 70% 16 OZ (MISCELLANEOUS) ×1 IMPLANT
BANDAGE ESMARK 6X9 LF (GAUZE/BANDAGES/DRESSINGS) IMPLANT
BIT DRILL 2 CANN GRADUATED (BIT) ×1 IMPLANT
BIT DRILL 2.5 CANN LNG (BIT) ×1 IMPLANT
BIT DRILL 2.7 (BIT) ×1
BIT DRILL 2.7X2.7/3XSCR ANKL (BIT) IMPLANT
BIT DRL 2.7X2.7/3XSCR ANKL (BIT) ×1
BNDG COHESIVE 4X5 TAN STRL (GAUZE/BANDAGES/DRESSINGS) ×1 IMPLANT
BNDG ELASTIC 4X5.8 VLCR STR LF (GAUZE/BANDAGES/DRESSINGS) IMPLANT
BNDG ELASTIC 6X10 VLCR STRL LF (GAUZE/BANDAGES/DRESSINGS) ×1 IMPLANT
BNDG ELASTIC 6X5.8 VLCR STR LF (GAUZE/BANDAGES/DRESSINGS) IMPLANT
BNDG ESMARK 6X9 LF (GAUZE/BANDAGES/DRESSINGS) ×2
CANISTER SUCT 3000ML PPV (MISCELLANEOUS) ×1 IMPLANT
COVER SURGICAL LIGHT HANDLE (MISCELLANEOUS) ×2 IMPLANT
COVER WAND RF STERILE (DRAPES) ×2 IMPLANT
CUFF TOURN SGL QUICK 34 (TOURNIQUET CUFF)
CUFF TRNQT CYL 34X4.125X (TOURNIQUET CUFF) IMPLANT
DRAPE C-ARM 42X72 X-RAY (DRAPES) ×2 IMPLANT
DRAPE C-ARMOR (DRAPES) ×2 IMPLANT
DRAPE U-SHAPE 47X51 STRL (DRAPES) ×2 IMPLANT
DRSG ADAPTIC 3X8 NADH LF (GAUZE/BANDAGES/DRESSINGS) ×2 IMPLANT
DRSG PAD ABDOMINAL 8X10 ST (GAUZE/BANDAGES/DRESSINGS) ×2 IMPLANT
DURAPREP 26ML APPLICATOR (WOUND CARE) ×3 IMPLANT
ELECT REM PT RETURN 9FT ADLT (ELECTROSURGICAL) ×2
ELECTRODE REM PT RTRN 9FT ADLT (ELECTROSURGICAL) ×1 IMPLANT
GAUZE SPONGE 4X4 12PLY STRL (GAUZE/BANDAGES/DRESSINGS) ×1 IMPLANT
GAUZE SPONGE 4X4 12PLY STRL LF (GAUZE/BANDAGES/DRESSINGS) ×1 IMPLANT
GLOVE BIO SURGEON STRL SZ7.5 (GLOVE) ×2 IMPLANT
GLOVE BIOGEL PI IND STRL 8 (GLOVE) ×1 IMPLANT
GLOVE BIOGEL PI INDICATOR 8 (GLOVE) ×1
GOWN STRL REUS W/ TWL LRG LVL3 (GOWN DISPOSABLE) ×2 IMPLANT
GOWN STRL REUS W/ TWL XL LVL3 (GOWN DISPOSABLE) ×1 IMPLANT
GOWN STRL REUS W/TWL LRG LVL3 (GOWN DISPOSABLE) ×2
GOWN STRL REUS W/TWL XL LVL3 (GOWN DISPOSABLE) ×1
IMPL TIGHTROP W/DRV K-LESS (Anchor) IMPLANT
IMPLANT TIGHTROPE W/DRV K-LESS (Anchor) ×2 IMPLANT
KIT BASIN OR (CUSTOM PROCEDURE TRAY) ×2 IMPLANT
KIT TURNOVER KIT B (KITS) ×2 IMPLANT
NDL HYPO 25GX1X1/2 BEV (NEEDLE) IMPLANT
NEEDLE HYPO 25GX1X1/2 BEV (NEEDLE) ×4 IMPLANT
NS IRRIG 1000ML POUR BTL (IV SOLUTION) ×2 IMPLANT
PACK ORTHO EXTREMITY (CUSTOM PROCEDURE TRAY) ×2 IMPLANT
PAD ABD 8X10 STRL (GAUZE/BANDAGES/DRESSINGS) ×1 IMPLANT
PAD ARMBOARD 7.5X6 YLW CONV (MISCELLANEOUS) ×4 IMPLANT
PAD CAST 4YDX4 CTTN HI CHSV (CAST SUPPLIES) ×2 IMPLANT
PADDING CAST COTTON 4X4 STRL (CAST SUPPLIES) ×1
PADDING CAST COTTON 6X4 STRL (CAST SUPPLIES) ×2 IMPLANT
PLATE LOCKING STR 6H (Plate) ×1 IMPLANT
SCREW CANCELLOUS 4X14 (Screw) ×2 IMPLANT
SCREW CORT 2.7X30MM (Screw) ×1 IMPLANT
SCREW LOW PROFILE 3.5X16 (Screw) ×3 IMPLANT
SCREW NLOCK T15 FT 18X3.5XST (Screw) IMPLANT
SCREW NON LOCK 3.5X18MM (Screw) ×1 IMPLANT
SPLINT FIBERGLASS 4X30 (CAST SUPPLIES) ×2 IMPLANT
SPONGE LAP 18X18 RF (DISPOSABLE) IMPLANT
STAPLER SKIN 35 WIDE (STAPLE) ×1 IMPLANT
SUCTION FRAZIER HANDLE 10FR (MISCELLANEOUS) ×1
SUCTION TUBE FRAZIER 10FR DISP (MISCELLANEOUS) ×1 IMPLANT
SUT ETHILON 3 0 PS 1 (SUTURE) ×1 IMPLANT
SUT VIC AB 0 CT1 27 (SUTURE)
SUT VIC AB 0 CT1 27XBRD ANBCTR (SUTURE) IMPLANT
SUT VIC AB 2-0 CT1 27 (SUTURE)
SUT VIC AB 2-0 CT1 TAPERPNT 27 (SUTURE) ×1 IMPLANT
SUT VIC AB 2-0 FS1 27 (SUTURE) ×1 IMPLANT
SYR CONTROL 10ML LL (SYRINGE) ×1 IMPLANT
TOWEL GREEN STERILE (TOWEL DISPOSABLE) ×4 IMPLANT
TUBE CONNECTING 12X1/4 (SUCTIONS) ×2 IMPLANT
YANKAUER SUCT BULB TIP NO VENT (SUCTIONS) ×2 IMPLANT

## 2020-02-23 NOTE — Anesthesia Preprocedure Evaluation (Addendum)
Anesthesia Evaluation  Patient identified by MRN, date of birth, ID band Patient awake    Reviewed: Allergy & Precautions, NPO status , Patient's Chart, lab work & pertinent test results  History of Anesthesia Complications Negative for: history of anesthetic complications  Airway Mallampati: II  TM Distance: >3 FB Neck ROM: Limited    Dental  (+) Teeth Intact, Dental Advisory Given   Pulmonary asthma ,    Pulmonary exam normal breath sounds clear to auscultation       Cardiovascular Exercise Tolerance: Good hypertension, Pt. on medications (-) anginaNormal cardiovascular exam Rhythm:Regular Rate:Normal  11/05/10 [facility] (Dr. Candace Cruise): IMPRESSION: 1. Abnormal stress test. 2. 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 3. Large dominant LCX without obstruction. 4. RCA small non-dominant and without any significant disease. 5. Left ventriculogram revealed brisk contractility in LAO and somewhat sluggish contractility in RAO. (45% in RAO, 60% in LAO, 49% by QCA). PLAN: F/U in office. Control risk factors. The apparent sluggishness of the LV may be due to cardiomyopathy which is presumably resolving presently and the patient's significant other had given a history of excess alcohol intake on the part of the patient, which apparently ceased 8 months earlier.   10/29/10 Nuclear stress test: Impression: Fairly comparable perfusion pattern is noted form adenosine to resting images, but concern is raised due to findings suggesting transient ischemic dilatation of LV which may be a manifestation of multivessel disease. LVEF is calculated at 51% on stress and 54% on rest. Cardiology consult recommended.   10/29/10 Echo: Conclusion: Dilated LV chamber size with preserved LV systolic and diastolic function and normal LV wall thickness. LVEF 57-59%. Mild MR. Mild TR. A trivial-sized pericardial effusion. No prior  studies for comparison.    Neuro/Psych PSYCHIATRIC DISORDERS Anxiety Depression Chronic back pain s/p SCS implant Neuropathy    GI/Hepatic negative GI ROS, Neg liver ROS,   Endo/Other  negative endocrine ROSObesity   Renal/GU negative Renal ROS     Musculoskeletal  (+) Arthritis , Osteoarthritis,  S/p left hand amputation   Abdominal   Peds  Hematology negative hematology ROS (+)   Anesthesia Other Findings Day of surgery medications reviewed with the patient.  Reproductive/Obstetrics                           Anesthesia Physical  Anesthesia Plan  ASA: III  Anesthesia Plan: General   Post-op Pain Management: GA combined w/ Regional for post-op pain   Induction: Intravenous  PONV Risk Score and Plan: 2 and Ondansetron, Dexamethasone and Treatment may vary due to age or medical condition  Airway Management Planned: Oral ETT and LMA  Additional Equipment:   Intra-op Plan:   Post-operative Plan: Extubation in OR  Informed Consent: I have reviewed the patients History and Physical, chart, labs and discussed the procedure including the risks, benefits and alternatives for the proposed anesthesia with the patient or authorized representative who has indicated his/her understanding and acceptance.     Dental advisory given  Plan Discussed with: CRNA, Anesthesiologist and Surgeon  Anesthesia Plan Comments:        Anesthesia Quick Evaluation

## 2020-02-23 NOTE — Plan of Care (Signed)

## 2020-02-23 NOTE — Op Note (Signed)
Date of Surgery: 02/23/2020  INDICATIONS: Mr. Kralik is a 54 y.o.-year-old male who sustained a left ankle fracture; he was indicated for open reduction and internal fixation due to the displaced nature of the articular fracture and came to the operating room today for this procedure. The patient did consent to the procedure after discussion of the risks and benefits.  PREOPERATIVE DIAGNOSIS: 1. left bimalleolar ankle fracture 2.  Left ankle syndesmotic disruption  POSTOPERATIVE DIAGNOSIS: Same.  PROCEDURE:  1. Open treatment of left ankle fracture with internal fixation. Lateral malleolar CPT [id number]; 2.  Open reduction internal fixation of left syndesmotic disruption (distal tibiofibular joint). 3.  Intraoperative stress radiography independently interpreted by myself.  Left ankle.  SURGEON: Etai Copado P. Stann Mainland, M.D.  ASSIST: Laure Kidney, RNFA.  ANESTHESIA:  general  TOURNIQUET TIME: 45 minutes at 300 mmHg  IV FLUIDS AND URINE: See anesthesia.  ESTIMATED BLOOD LOSS: 20 mL.  IMPLANTS:  Arthrex stainless steel one third tubular locking plate 2.7 mm interfragmentary compression screw by technique 4.0 mm cancellous screw distal 3 x 3.5 mm proximal nonlocking screws 1 stainless steel tight rope fixation device for syndesmotic fixation  COMPLICATIONS: None.  DESCRIPTION OF PROCEDURE: The patient was brought to the operating room and placed supine on the operating table.  The patient had been signed prior to the procedure and this was documented. The patient had the anesthesia placed by the anesthesiologist.  A nonsterile tourniquet was placed on the upper thigh.  The prep verification and incision time-outs were performed to confirm that this was the correct patient, site, side and location. The patient had an SCD on the opposite lower extremity. The patient did receive antibiotics prior to the incision and was re-dosed during the procedure as needed at indicated intervals.  The  patient had the lower extremity prepped and draped in the standard surgical fashion.  The extremity was exsanguinated using an esmarch bandage and the tourniquet was inflated to 300 mm Hg.   Incision was made over the distal fibula and the fracture was exposed and reduced anatomically with a clamp. A lag screw was placed. I then applied a 1/3 tubular locking plate and secured it proximally and distally with non-locking screws. Bone quality was good. I used c-arm to confirm satisfactory reduction and fixation.   I then turned my attention to the medial malleolus. Incision was made over the medial malleolus and the fracture exposed and held provisionally with a clamp. 2 guidepins were placed for the 4.0 mm cannulated screws and then confirmation of reduction was made with fluoroscopy. I then placed 2  17mm screws which had satisfactory fixation.   The syndesmosis was stressed using live fluoroscopy and found to be unstable.   Next we turned our attention to the syndesmotic fixation.  Via our lateral incision we were able to anatomically aligned the syndesmotic tissue.  Once this was accomplished with a clamp we then set up for fixation via a Arthrex tight rope device.  We drilled from the lateral cortex through a available hole in our distal fibula plate.  We drilled across both cortices of the fibula and both cortices of the tibia.  This was placed parallel to the tibial plafond.  We then passed the tight rope device and flipped the Endobutton on the medial cortex of the tibia.  Then under live fluoroscopic imagery we reduced the syndesmosis.  Care was taken to float the heel off of the bed and keep the tibiotalar joint at neutral dorsiflexion.  This had excellent stability.  The posterior tibia lip fracture was felt to be consistent with syndesmotic disruption and did not require separate fixation.  The wounds were irrigated, and closed with vicryl with routine closure for the skin. The wounds were  injected with local anesthetic. Sterile gauze was applied followed by a posterior splint. He was awakened and returned to the PACU in stable and satisfactory condition. There were no complications.    POSTOPERATIVE PLAN: Mr. Affleck will remain nonweightbearing on this leg for approximately 6 weeks; Mr. Reddic will return for suture removal in 2 weeks.  He will be immobilized in a short leg splint and then transitioned to a CAM walker at his first follow up appointment.  Mr. Kata will receive DVT prophylaxis based on other medications, activity level, and risk ratio of bleeding to thrombosis.  He will discharge from PACU to his hospital bed on the hospitalist service.  He will be evaluated for placement in a skilled nursing facility due to his maximum need of assistance and having previously amputated left forearm.  Geralynn Rile, MD [facility] [facility] [phone] 9:13 AM

## 2020-02-23 NOTE — Anesthesia Procedure Notes (Signed)
Anesthesia Regional Block: Popliteal block   Pre-Anesthetic Checklist: ,, timeout performed, Correct Patient, Correct Site, Correct Laterality, Correct Procedure, Correct Position, site marked, Risks and benefits discussed,  Surgical consent,  Pre-op evaluation,  At surgeon's request and post-op pain management  Laterality: Left  Prep: chloraprep       Needles:  Injection technique: Single-shot  Needle Type: Echogenic Stimulator Needle     Needle Length: 5cm  Needle Gauge: 22     Additional Needles:   Procedures:, nerve stimulator,,, ultrasound used (permanent image in chart),,,,   Nerve Stimulator or Paresthesia:  Response: foot, 0.45 mA,   Additional Responses:   Narrative:  Start time: 02/23/2020 7:32 AM End time: 02/23/2020 7:40 AM Injection made incrementally with aspirations every 5 mL.  Events: injection painful,,,,,,,,,,  Performed by: Personally  Anesthesiologist: Janeece Riggers, MD  Additional Notes: Functioning IV was confirmed and monitors were applied.  A 65mm 22ga Arrow echogenic stimulator needle was used. Sterile prep and drape,hand hygiene and sterile gloves were used. Ultrasound guidance: relevant anatomy identified, needle position confirmed, local anesthetic spread visualized around nerve(s)., vascular puncture avoided.  Image printed for medical record. Negative aspiration and negative test dose prior to incremental administration of local anesthetic. The patient tolerated the procedure well.   PT experienced significant pain on injection,  Needle repositioned with recurrent paraesthesia.

## 2020-02-23 NOTE — Discharge Instructions (Signed)
Orthopedic surgery discharge instructions:  -You should maintain the splint on your left ankle clean and dry at all times. -Elevate your left lower extremity with your "toes above nose.  You should do this at all times when able. -No weightbearing to the left lower extremity for 6 weeks.  -Apply ice to the ankle for 30 minutes out of each hour as able.  -For the prevention of blood clots take an 81 mg aspirin once per day for the next 6 weeks.  -You are okay to use Tylenol and Advil in addition to your narcotic medication for pain.  This will help reduce your base level of pain.  -Return to see Dr. Stann Mainland in 2 weeks for routine postoperative care.

## 2020-02-23 NOTE — Progress Notes (Signed)
PROGRESS NOTE    Daniel Durham  [id number] DOB: [birth year] DOA: 02/15/2020 PCP: Brunetta Jeans, PA-C   Chief Complaint  Patient presents with  . Fall    Brief Narrative:  54 year old male with history of chronic back pain, asthma, EtOH use, LUE amputation and HTN presenting after mechanical fall with head impaction and brief LOC, and left ankle fracture as noted on x-ray.    In ED, left ankle x-ray revealed revealed trimalleolar fracture with tiny osseous fragments seen on the medial anterior talar dome.  Patient had reduction in ED.  Orthopedic surgery, Dr. Stann Mainland consulted, and initially recommended outpatient follow-up in Monique Hefty week for surgery when the swelling improves.  However, patient with unstable fracture,  significant pain and has LUE amputation to be discharged home safely.  So he was admitted for adequate pain control and surgical intervention on 02/23/2020.  CT head, C-spine and L-spine without significant finding.  COVID-19 PCR negative.  Assessment & Plan:   Principal Problem:   Ankle fracture, bimalleolar, closed, left, initial encounter Active Problems:   HTN (hypertension)   Closed left ankle fracture  Mechanical fall at home Left trimalleolar fracture Left wrist pain-x-ray without significant finding. - s/p reduction in the ED -Continue current pain regimen for pain control with bowel regimen. -Continue leg elevation -s/p open treatment of L ankle fracture with internal fixation and open reduction internal fixation of L syndesmotic disruption and intraoperative stress radiography on 4/22 - appreciate ortho assistance - Pain management  Acute Kidney Injury: will start IVF, continue to hold lisinopril   Essential hypertension: Normotensive -Continue home lisinopril - hold prior to surgery  Alcohol abuse -CIWA monitoring with Ativan -Continue vitamins -Cessation counseling.  Elevated liver enzymes: Likely due to alcohol.  Resolved.  History of  asthma: On DuoNeb at home. -Continue Singulair and as needed DuoNeb  Chronic pain syndrome: On Percocet, gabapentin, Zanaflex -Continue as needed oxycodone, gabapentin and Flexeril (IV morphine for breakthrough) -Tylenol 1 g 3 times daily -As needed MiraLAX and Senokot-S  RUQ pain: Seems to have resolved.  CT abdomen without significant finding.  Mild LFT resolved. -Continue monitoring -surgery rec f/u outpatient in clinic for elective outpatient gallbladder  Class II obesity: Body mass index is 39.16 kg/m. -Encourage lifestyle change to lose weight.  DVT prophylaxis: SCD Code Status: full Family Communication: wife, mother in law at bedside Disposition:   Status is: Inpatient  Remains inpatient appropriate because:IV treatments appropriate due to intensity of illness or inability to take PO, Inpatient level of care appropriate due to severity of illness and plan for surgery 4/22   Dispo: The patient is from: Home              Anticipated d/c is to: Home              Anticipated d/c date is: 2 days              Patient currently is not medically stable to d/c.   Consultants:   Ortho  surgery  Procedures: Reduction in ED  Antimicrobials:  Anti-infectives (From admission, onward)   Start     Dose/Rate Route Frequency Ordered Stop   02/23/20 1100  ceFAZolin (ANCEF) IVPB 1 g/50 mL premix     1 g 100 mL/hr over 30 Minutes Intravenous Every 6 hours 02/23/20 1046 02/24/20 0459   02/23/20 0600  ceFAZolin (ANCEF) IVPB 2g/100 mL premix     2 g 200 mL/hr over 30 Minutes Intravenous On call to O.R.  02/22/20 1936 02/23/20 0840     Subjective: C/o post op pain  Objective: Vitals:   02/23/20 1010 02/23/20 1025 02/23/20 1102 02/23/20 1603  BP: 102/73 100/61 127/71 128/65  Pulse: 79 82 82 93  Resp: 10 10 17 17   Temp:  97.8 F (36.6 C) 97.8 F (36.6 C) 97.7 F (36.5 C)  TempSrc:   Oral Oral  SpO2: 93% 93% 91% 93%  Weight:      Height:        Intake/Output  Summary (Last 24 hours) at 02/23/2020 1656 Last data filed at 02/23/2020 [id number] Gross per 24 hour  Intake 1680 ml  Output 1216 ml  Net 464 ml   Filed Weights   02/15/20 1654 02/23/20 0721  Weight: 113.4 kg 114 kg    Examination:  General: No acute distress. Cardiovascular: Heart sounds show Veatrice Eckstein regular rate, and rhythm. Lungs: Clear to auscultation bilaterally Abdomen: Soft, nontender, nondistended  Neurological: Alert and oriented 3. Moves all extremities 4. Cranial nerves II through XII grossly intact. Skin: Warm and dry. No rashes or lesions. Extremities: LLE with dressing in place  Data Reviewed: I have personally reviewed following labs and imaging studies  CBC: Recent Labs  Lab 02/17/20 0505 02/22/20 0824 02/23/20 0348  WBC 7.1 5.6 6.0  NEUTROABS  --  2.9 2.7  HGB 13.9 14.9 13.9  HCT 41.9 44.3 41.8  MCV 103.7* 101.1* 102.7*  PLT 146* 188 [id number]    Basic Metabolic Panel: Recent Labs  Lab 02/17/20 0505 02/22/20 0824 02/23/20 0348  NA 135 133* 134*  K 4.2 4.3 4.5  CL 99 102 101  CO2 25 20* 24  GLUCOSE 116* 129* 120*  BUN 9 13 24*  CREATININE 1.09 0.98 1.32*  CALCIUM 8.7* 8.7* 8.8*  MG  --   --  2.5*  PHOS  --   --  3.8    GFR: Estimated Creatinine Clearance: 79 mL/min (Shivani Barrantes) (by C-G formula based on SCr of 1.32 mg/dL (H)).  Liver Function Tests: Recent Labs  Lab 02/17/20 0505 02/22/20 0824 02/23/20 0348  AST 35 63* 54*  ALT 46* 74* 68*  ALKPHOS 59 76 79  BILITOT 0.9 0.5 0.6  PROT 6.3* 6.8 6.3*  ALBUMIN 3.6 3.6 3.4*    CBG: No results for input(s): GLUCAP in the last 168 hours.   Recent Results (from the past 240 hour(s))  SARS Coronavirus 2 by RT PCR     Status: None   Collection Time: 02/16/20  4:37 AM  Result Value Ref Range Status   SARS Coronavirus 2 NEGATIVE NEGATIVE Final    Comment: (NOTE) Result indicates the ABSENCE of SARS-CoV-2 RNA in the patient specimen.  The lowest concentration of SARS-CoV-2 viral copies this assay  can detect in nasopharyngeal swab specimens is 500 copies / mL.  Cleburne Savini negative result does not preclude SARS-CoV-2 infection and should not be used as the sole basis for patient management decisions. Siegfried Vieth negative result may occur with improper specimen collection / handling, submission of Bird Swetz specimen other than nasopharyngeal swab, presence of viral mutation(s) within the areas targeted by this assay, and inadequate number of viral copies (<500 copies / mL) present.  Negative results must be combined with clinical observations, patient history, and epidemiological information.  The expected result is NEGATIVE.  Patient Fact Sheet:  BlogSelections.co.uk   Provider Fact Sheet:  https://lucas.com/   This test is not yet approved or cleared by the [place] FDA and  has been authorized for  detection  and/or diagnosis of SARS-CoV-2 by FDA under an Emergency Use Authorization (EUA).  This EUA will remain in effect (meaning this test can be used) for the duration of  the COVID-19 declaration under Section 564(b)(1) of the Act, 21 U.S.C. section 360bbb-3(b)(1), unless the authorization is terminated or revoked sooner Performed at [facility], [place] [address] [place], [place] [address]   Surgical pcr screen     Status: Abnormal   Collection Time: 02/23/20  5:59 AM   Specimen: Nasal Mucosa; Nasal Swab  Result Value Ref Range Status   MRSA, PCR NEGATIVE NEGATIVE Final   Staphylococcus aureus POSITIVE (Rutledge Selsor) NEGATIVE Final    Comment: (NOTE) The Xpert SA Assay (FDA approved for NASAL specimens in patients 43 years of age and older), is one component of Lillianne Eick comprehensive surveillance program. It is not intended to diagnose infection nor to guide or monitor treatment. Performed at [facility], [place] [address], [place], [place] [address]          Radiology Studies: DG Ankle Complete Left  Result Date: 02/23/2020 CLINICAL DATA:   Intraoperative fluoroscopy EXAM: DG C-ARM 1-60 MIN; LEFT ANKLE COMPLETE - 3+ VIEW CONTRAST:  None FLUOROSCOPY TIME:  Fluoroscopy Time:  Not reported Radiation Exposure Index (if provided by the fluoroscopic device): NA Number of Acquired Spot Images: 4 COMPARISON:  None. FINDINGS: Intraoperative fluoroscopic views of the left ankle are provided for review demonstrating ORIF of the distal fibula and tight rope repair of the distal tibiofibular syndesmosis. IMPRESSION: Intraoperative fluoroscopic views of the left ankle are provided for review demonstrating ORIF of the distal fibula and tight rope repair of the distal tibiofibular syndesmosis. Electronically Signed   By: Eddie Candle M.D.   On: 02/23/2020 12:11   DG C-Arm 1-60 Min  Result Date: 02/23/2020 CLINICAL DATA:  Intraoperative fluoroscopy EXAM: DG C-ARM 1-60 MIN; LEFT ANKLE COMPLETE - 3+ VIEW CONTRAST:  None FLUOROSCOPY TIME:  Fluoroscopy Time:  Not reported Radiation Exposure Index (if provided by the fluoroscopic device): NA Number of Acquired Spot Images: 4 COMPARISON:  None. FINDINGS: Intraoperative fluoroscopic views of the left ankle are provided for review demonstrating ORIF of the distal fibula and tight rope repair of the distal tibiofibular syndesmosis. IMPRESSION: Intraoperative fluoroscopic views of the left ankle are provided for review demonstrating ORIF of the distal fibula and tight rope repair of the distal tibiofibular syndesmosis. Electronically Signed   By: Eddie Candle M.D.   On: 02/23/2020 12:11        Scheduled Meds: . acetaminophen  1,000 mg Oral Q8H  . atorvastatin  10 mg Oral q1800  . docusate sodium  100 mg Oral BID  . folic acid  1 mg Oral Daily  . gabapentin  900 mg Oral BID  . montelukast  10 mg Oral QHS  . multivitamin with minerals  1 tablet Oral Daily  . polyethylene glycol  17 g Oral BID  . senna-docusate  2 tablet Oral QHS  . sertraline  50 mg Oral Daily  . thiamine  100 mg Oral Daily   Or  . thiamine   100 mg Intravenous Daily   Continuous Infusions: .  ceFAZolin (ANCEF) IV 1 g (02/23/20 1314)  . lactated ringers 10 mL/hr at 02/23/20 0729     LOS: 4 days    Time spent: over 30 min    Fayrene Helper, MD [facility]   To contact the attending provider between 7A-7P or the covering provider during after hours 7P-7A,  please log into the web site [email or web address] and access using universal [facility] password for that web site. If you do not have the password, please call the hospital operator.  02/23/2020, 4:56 PM

## 2020-02-23 NOTE — H&P (Signed)
H&P update  The surgical history has been reviewed and remains accurate without interval change.  The patient was re-examined and patient's physiologic condition has not changed significantly in the last 30 days. The condition still exists that makes this procedure necessary. The treatment plan remains the same, without new options for care.  No new pharmacological allergies or types of therapy has been initiated that would change the plan or the appropriateness of the plan.  The patient and/or family understand the potential benefits and risks.  Kimanh Templeman P. Stann Mainland, MD 02/23/2020 8:09 AM

## 2020-02-23 NOTE — Plan of Care (Signed)
  Problem: Education: Goal: Knowledge of General Education information will improve Description Including pain rating scale, medication(s)/side effects and non-pharmacologic comfort measures Outcome: Progressing   

## 2020-02-23 NOTE — Brief Op Note (Signed)
02/15/2020 - 02/23/2020  9:11 AM  PATIENT:  Daniel Durham  54 y.o. male  PRE-OPERATIVE DIAGNOSIS:  left ankle fracture  POST-OPERATIVE DIAGNOSIS:  left ankle fracture  PROCEDURE:  Procedure(s): OPEN REDUCTION INTERNAL FIXATION (ORIF) ANKLE FRACTURE (Left) Open reduction and internal fixation of syndesmosis, left  SURGEON:  Surgeon(s) and Role:    * Stann Mainland, Elly Modena, MD - Primary  PHYSICIAN ASSISTANT:   ASSISTANTS: Laure Kidney, RNFA   ANESTHESIA:   local, regional and general  EBL:  20 cc  BLOOD ADMINISTERED:none  DRAINS: none   LOCAL MEDICATIONS USED:  MARCAINE     SPECIMEN:  No Specimen  DISPOSITION OF SPECIMEN:  N/A  COUNTS:  YES  TOURNIQUET:  * Missing tourniquet times found for documented tourniquets in log: [id number] *  DICTATION: .Note written in EPIC  PLAN OF CARE: Discharge to home after PACU  PATIENT DISPOSITION:  PACU - hemodynamically stable.   Delay start of Pharmacological VTE agent (>24hrs) due to surgical blood loss or risk of bleeding: not applicable

## 2020-02-23 NOTE — Anesthesia Procedure Notes (Signed)
Procedure Name: LMA Insertion Date/Time: 02/23/2020 8:21 AM Performed by: Jenne Campus, CRNA Pre-anesthesia Checklist: Patient identified, Emergency Drugs available, Suction available and Patient being monitored Patient Re-evaluated:Patient Re-evaluated prior to induction Oxygen Delivery Method: Circle System Utilized Preoxygenation: Pre-oxygenation with 100% oxygen Induction Type: IV induction Ventilation: Mask ventilation without difficulty LMA: LMA inserted LMA Size: 4.0 Number of attempts: 1 Airway Equipment and Method: Bite block Placement Confirmation: positive ETCO2 and breath sounds checked- equal and bilateral Tube secured with: Tape Dental Injury: Teeth and Oropharynx as per pre-operative assessment

## 2020-02-23 NOTE — Transfer of Care (Signed)
Immediate Anesthesia Transfer of Care Note  Patient: Daniel Durham  Procedure(s) Performed: OPEN REDUCTION INTERNAL FIXATION (ORIF) ANKLE FRACTURE (Left Ankle)  Patient Location: PACU  Anesthesia Type:General  Level of Consciousness: awake, oriented and patient cooperative  Airway & Oxygen Therapy: Patient Spontanous Breathing and Patient connected to face mask oxygen  Post-op Assessment: Report given to RN and Post -op Vital signs reviewed and stable  Post vital signs: Reviewed  Last Vitals:  Vitals Value Taken Time  BP 117/79 02/23/20 0940  Temp    Pulse 78 02/23/20 0945  Resp 4 02/23/20 0945  SpO2 100 % 02/23/20 0945  Vitals shown include unvalidated device data.  Last Pain:  Vitals:   02/23/20 0622  TempSrc: Oral  PainSc:       Patients Stated Pain Goal: 2 ([id number] [phone])  Complications: No apparent anesthesia complications

## 2020-02-23 NOTE — Progress Notes (Signed)
OR nurse called to confirm that Dr. Stann Mainland is ready to take patient to surgery earlier due cancellations of 2 other surgeries. Report called to [phone] at 06:55.   OR Staff here to pick pt up at 07:13 am. Consent completed noted on chart front. CHG And PCR completed.

## 2020-02-24 LAB — CBC WITH DIFFERENTIAL/PLATELET
Abs Immature Granulocytes: 0.07 10*3/uL (ref 0.00–0.07)
Basophils Absolute: 0 10*3/uL (ref 0.0–0.1)
Basophils Relative: 0 %
Eosinophils Absolute: 0 10*3/uL (ref 0.0–0.5)
Eosinophils Relative: 0 %
HCT: 40.9 % (ref 39.0–52.0)
Hemoglobin: 13.7 g/dL (ref 13.0–17.0)
Immature Granulocytes: 1 %
Lymphocytes Relative: 4 %
Lymphs Abs: 0.6 10*3/uL — ABNORMAL LOW (ref 0.7–4.0)
MCH: 34.3 pg — ABNORMAL HIGH (ref 26.0–34.0)
MCHC: 33.5 g/dL (ref 30.0–36.0)
MCV: 102.5 fL — ABNORMAL HIGH (ref 80.0–100.0)
Monocytes Absolute: 1 10*3/uL (ref 0.1–1.0)
Monocytes Relative: 7 %
Neutro Abs: 12.8 10*3/uL — ABNORMAL HIGH (ref 1.7–7.7)
Neutrophils Relative %: 88 %
Platelets: 214 10*3/uL (ref 150–400)
RBC: 3.99 MIL/uL — ABNORMAL LOW (ref 4.22–5.81)
RDW: 12 % (ref 11.5–15.5)
WBC: 14.6 10*3/uL — ABNORMAL HIGH (ref 4.0–10.5)
nRBC: 0 % (ref 0.0–0.2)

## 2020-02-24 LAB — COMPREHENSIVE METABOLIC PANEL
ALT: 60 U/L — ABNORMAL HIGH (ref 0–44)
AST: 43 U/L — ABNORMAL HIGH (ref 15–41)
Albumin: 3.4 g/dL — ABNORMAL LOW (ref 3.5–5.0)
Alkaline Phosphatase: 76 U/L (ref 38–126)
Anion gap: 8 (ref 5–15)
BUN: 20 mg/dL (ref 6–20)
CO2: 23 mmol/L (ref 22–32)
Calcium: 8.9 mg/dL (ref 8.9–10.3)
Chloride: 105 mmol/L (ref 98–111)
Creatinine, Ser: 1.08 mg/dL (ref 0.61–1.24)
GFR calc Af Amer: >60 mL/min (ref >60)
GFR calc non Af Amer: >60 mL/min (ref >60)
Glucose, Bld: 255 mg/dL — ABNORMAL HIGH (ref 70–99)
Potassium: 5.3 mmol/L — ABNORMAL HIGH (ref 3.5–5.1)
Sodium: 136 mmol/L (ref 135–145)
Total Bilirubin: 0.6 mg/dL (ref 0.3–1.2)
Total Protein: 6.5 g/dL (ref 6.5–8.1)

## 2020-02-24 LAB — POTASSIUM: Potassium: 4.7 mmol/L (ref 3.5–5.1)

## 2020-02-24 LAB — MAGNESIUM: Magnesium: 2.3 mg/dL (ref 1.7–2.4)

## 2020-02-24 LAB — PHOSPHORUS: Phosphorus: 1.1 mg/dL — ABNORMAL LOW (ref 2.5–4.6)

## 2020-02-24 MED ORDER — ENOXAPARIN SODIUM 40 MG/0.4ML ~~LOC~~ SOLN
40.0000 mg | SUBCUTANEOUS | Status: DC
  Administered 2020-02-24 – 2020-02-28 (×5): 40 mg via SUBCUTANEOUS
  Filled 2020-02-24 (×5): qty 0.4

## 2020-02-24 NOTE — Progress Notes (Signed)
   Subjective:  Patient reports pain as moderate to severe.  Having breakthrough pain.  But up in chair.  No overnight events.  Objective:   VITALS:   Vitals:   02/23/20 2352 02/24/20 0403 02/24/20 0824 02/24/20 1602  BP: 129/70 (!) 136/92 (!) 142/86 (!) 141/77  Pulse: 90 84 73 74  Resp: 16 16 18 18   Temp: 97.9 F (36.6 C) 98 F (36.7 C) 97.6 F (36.4 C) (!) 97.5 F (36.4 C)  TempSrc: Oral Oral Oral Oral  SpO2: 97% 98% 95% 94%  Weight:      Height:        no pain with active or PROM Block still dense to Sensation Cap refill < 2 sec Splint in good repair  Lab Results  Component Value Date   WBC 14.6 (H) 02/24/2020   HGB 13.7 02/24/2020   HCT 40.9 02/24/2020   MCV 102.5 (H) 02/24/2020   PLT 214 02/24/2020   BMET    Component Value Date/Time   NA 136 02/24/2020 0437   K 4.7 02/24/2020 1417   CL 105 02/24/2020 0437   CO2 23 02/24/2020 0437   GLUCOSE 255 (H) 02/24/2020 0437   BUN 20 02/24/2020 0437   CREATININE 1.08 02/24/2020 0437   CALCIUM 8.9 02/24/2020 0437   GFRNONAA >60 02/24/2020 0437   GFRAA >60 02/24/2020 0437     Assessment/Plan: 1 Day Post-Op   Principal Problem:   Ankle fracture, bimalleolar, closed, left, initial encounter Active Problems:   HTN (hypertension)   Closed left ankle fracture   Up with therapy - strict elevation of left ankle/foot - NWB to LLE - follow up 2 weeks with Stann Mainland  - dc on daily 81 mg asa x 6 weeks for DVT ppx   Nicholes Stairs 02/24/2020, 5:08 PM   Geralynn Rile, MD [phone]

## 2020-02-24 NOTE — Plan of Care (Signed)
  Problem: Clinical Measurements: Goal: Will remain free from infection Outcome: Progressing   Problem: Nutrition: Goal: Adequate nutrition will be maintained Outcome: Progressing   Problem: Coping: Goal: Level of anxiety will decrease Outcome: Progressing

## 2020-02-24 NOTE — Progress Notes (Signed)
Inpatient Rehab Admissions Coordinator Note:   Per PT recommendations, pt was screened for CIR candidacy by Shann Medal, PT, DPT.  At this time we are recommending CIR consult.  I will place an order per our protocol.  Please contact me with questions.   Shann Medal, PT, DPT [phone] 02/24/20 4:22 PM

## 2020-02-24 NOTE — Anesthesia Postprocedure Evaluation (Signed)
Anesthesia Post Note  Patient: Daniel Durham  Procedure(s) Performed: OPEN REDUCTION INTERNAL FIXATION (ORIF) ANKLE FRACTURE (Left Ankle)     Patient location during evaluation: PACU Anesthesia Type: General Level of consciousness: awake and alert Pain management: pain level controlled Vital Signs Assessment: post-procedure vital signs reviewed and stable Respiratory status: spontaneous breathing, nonlabored ventilation, respiratory function stable and patient connected to nasal cannula oxygen Cardiovascular status: blood pressure returned to baseline and stable Postop Assessment: no apparent nausea or vomiting Anesthetic complications: no    Last Vitals:  Vitals:   02/24/20 0403 02/24/20 0824  BP: (!) 136/92 (!) 142/86  Pulse: 84 73  Resp: 16 18  Temp: 36.7 C 36.4 C  SpO2: 98% 95%    Last Pain:  Vitals:   02/24/20 0824  TempSrc: Oral  PainSc: 0-No pain                 Maloni Musleh

## 2020-02-24 NOTE — TOC Progression Note (Signed)
Transition of Care [facility]) - Progression Note    Patient Details  Name: Daniel Durham MRN: [id number] Date of Birth: 10-01-1966  Transition of [facility]) CM/SW Contact  Sharin Mons, RN Phone Number: 02/24/2020, 4:46 PM  Clinical Narrative:    Admitted with L ankle. fx s/p reduction. PMH includes alcohol use, COVID, HTN, and Traumatic L forearm amputation. From hom with wife.          -fx s/p reduction,4/23  PT's recommendations: CIR(if denied CIR he is refusing SNF and will require HHPT.)  TOC  Following for TOC needs....  Expected Discharge Plan: Skilled Nursing Facility Barriers to Discharge: Continued Medical Work up  Expected Discharge Plan and Services Expected Discharge Plan: [place]                                               Social Determinants of Health (SDOH) Interventions    Readmission Risk Interventions No flowsheet data found.

## 2020-02-24 NOTE — Progress Notes (Signed)
Physical Therapy Treatment Patient Details Name: Daniel Durham MRN: [id number] DOB: 1966/06/27 Today's Date: 02/24/2020    History of Present Illness Pt is a 54 y/o male admitted 02/15/20 after fall sustaining L trimalleolar fx, pt reportedly slip and hit his head with brief LOC. S/p L ankle reduction and splinting in ED. Plan for potential sx 4/22 for fixation. PMH includes alcohol use, COVID, HTN, traumatic L forearm amputation.    PT Comments    Pt supine in bed.  He reports increased pain that worsens with foot in dependent position.  He is limited due to L forearm amputation and new NWB status on LLE.  He is able to pivot from bed to recliner to his good side with min assistance.  He will have difficulty transferring to his weaker side which he will encounter at home.  Based on his presentation and functional limitations he will continue to benefit from aggressive rehab at [facility].  He lives with his wife who is there partially but I feel strongly he will only require a week of intensive therapies before he is functioning at MOD I.     Follow Up Recommendations  CIR(if denied CIR he is refusing SNF and will require HHPT.)     Equipment Recommendations  Wheelchair (measurements PT);Wheelchair cushion (measurements PT);Other (comment);3in1 (PT)(L platform RW and drop arm commode.)    Recommendations for Other Services       Precautions / Restrictions Precautions Precautions: Fall;Other (comment) Precaution Comments: L traumatic forearm amputation at baseline Restrictions Weight Bearing Restrictions: Yes LLE Weight Bearing: Non weight bearing Other Position/Activity Restrictions: L forearm amputation at baseline    Mobility  Bed Mobility Overal bed mobility: Modified Independent Bed Mobility: Supine to Sit     Supine to sit: Modified independent (Device/Increase time)     General bed mobility comments: Mod indep to sit with bed flat, use of bed rail; mod indep for supine<>sit  with HOB elevated  Transfers Overall transfer level: Needs assistance Equipment used: None Transfers: Squat Pivot Transfers     Squat pivot transfers: Min assist     General transfer comment: Min assistance to move from bed to recliner without device.  POor eccentric load returning to seated surface.  Ambulation/Gait Ambulation/Gait assistance: (NT)               Stairs             Wheelchair Mobility    Modified Rankin (Stroke Patients Only)       Balance Overall balance assessment: Needs assistance   Sitting balance-Leahy Scale: Fair       Standing balance-Leahy Scale: Poor Standing balance comment: Reliant on UE support, poor balance without                            Cognition Arousal/Alertness: Awake/alert Behavior During Therapy: WFL for tasks assessed/performed Overall Cognitive Status: No family/caregiver present to determine baseline cognitive functioning Area of Impairment: Safety/judgement                         Safety/Judgement: Decreased awareness of deficits            Exercises General Exercises - Lower Extremity Ankle Circles/Pumps: AROM;Right;10 reps;Supine Quad Sets: AROM;Both;10 reps;Supine Heel Slides: AROM;Strengthening;10 reps Hip ABduction/ADduction: Strengthening;AROM;10 reps    General Comments        Pertinent Vitals/Pain Pain Assessment: Faces Faces Pain Scale: Hurts little more Pain Location:  L foot/ankle on medial border Pain Descriptors / Indicators: Sharp;Heaviness;Numbness Pain Intervention(s): Monitored during session;Repositioned    Home Living                      Prior Function            PT Goals (current goals can now be found in the care plan section) Acute Rehab PT Goals Patient Stated Goal: to be more independent before going home Potential to Achieve Goals: Fair Progress towards PT goals: Progressing toward goals    Frequency    Min 3X/week       PT Plan Current plan remains appropriate    Co-evaluation              AM-PAC PT "6 Clicks" Mobility   Outcome Measure  Help needed turning from your back to your side while in a flat bed without using bedrails?: None Help needed moving from lying on your back to sitting on the side of a flat bed without using bedrails?: A Little Help needed moving to and from a bed to a chair (including a wheelchair)?: A Little Help needed standing up from a chair using your arms (e.g., wheelchair or bedside chair)?: A Little Help needed to walk in hospital room?: A Little Help needed climbing 3-5 steps with a railing? : Total 6 Click Score: 17    End of Session Equipment Utilized During Treatment: Gait belt Activity Tolerance: Patient tolerated treatment well Patient left: with call bell/phone within reach;in chair;with chair alarm set Nurse Communication: Mobility status PT Visit Diagnosis: Difficulty in walking, not elsewhere classified (R26.2);Pain Pain - Right/Left: Left Pain - part of body: Ankle and joints of foot     Time: 1536-1601 PT Time Calculation (min) (ACUTE ONLY): 25 min  Charges:  $Therapeutic Exercise: 8-22 mins $Therapeutic Activity: 8-22 mins                     Daniel Durham , PTA Acute Rehabilitation Services Pager [phone] Office [phone]     Daniel Durham 02/24/2020, 4:12 PM

## 2020-02-24 NOTE — Plan of Care (Signed)
  Problem: Education: Goal: Knowledge of General Education information will improve Description Including pain rating scale, medication(s)/side effects and non-pharmacologic comfort measures Outcome: Progressing   

## 2020-02-24 NOTE — Progress Notes (Signed)
PROGRESS NOTE    Daniel Durham  [id number] DOB: November 03, 1966 DOA: 02/15/2020 PCP: Brunetta Jeans, PA-C   Chief Complaint  Patient presents with  . Fall    Brief Narrative:  54 year old male with history of chronic back pain, asthma, EtOH use, LUE amputation and HTN presenting after mechanical fall with head impaction and brief LOC, and left ankle fracture as noted on x-ray.    In ED, left ankle x-ray revealed revealed trimalleolar fracture with tiny osseous fragments seen on the medial anterior talar dome.  Patient had reduction in ED.  Orthopedic surgery, Dr. Stann Mainland consulted, and initially recommended outpatient follow-up in [place] week for surgery when the swelling improves.  However, patient with unstable fracture,  significant pain and has LUE amputation to be discharged home safely.  So he was admitted for adequate pain control and surgical intervention on 02/23/2020.  CT head, C-spine and L-spine without significant finding.  COVID-19 PCR negative.  Assessment & Plan:   Principal Problem:   Ankle fracture, bimalleolar, closed, left, initial encounter Active Problems:   HTN (hypertension)   Closed left ankle fracture  Mechanical fall at home Left trimalleolar fracture Left wrist pain-x-ray without significant finding. - s/p reduction in the ED -Continue current pain regimen for pain control with bowel regimen. -Continue leg elevation -s/p open treatment of L ankle fracture with internal fixation and open reduction internal fixation of L syndesmotic disruption and intraoperative stress radiography on 4/22 - appreciate ortho assistance - Pain management - therapy recommending CIR   Acute Kidney Injury: improved, continue to monitor  Essential hypertension: Normotensive -Continue home lisinopril - hold prior to surgery, continue to hold for now  Alcohol abuse -CIWA monitoring with Ativan -Continue vitamins -Cessation counseling.  Elevated liver enzymes: Mild,  continue to monitor.  History of asthma: On DuoNeb at home. -Continue Singulair and as needed DuoNeb  Chronic pain syndrome: On Percocet, gabapentin, Zanaflex -Continue as needed oxycodone, gabapentin and Flexeril (IV morphine for breakthrough) -Tylenol 1 g 3 times daily -As needed MiraLAX and Senokot-S  RUQ pain: Seems to have resolved.  CT abdomen without significant finding.  Mild LFT resolved. -Continue monitoring -surgery rec f/u outpatient in clinic for elective outpatient gallbladder  Class II obesity: Body mass index is 39.16 kg/m. -Encourage lifestyle change to lose weight.  DVT prophylaxis: SCD Code Status: full Family Communication: wife, mother in law at bedside Disposition:   Status is: Inpatient  Remains inpatient appropriate because:IV treatments appropriate due to intensity of illness or inability to take PO, Inpatient level of care appropriate due to severity of illness and plan for surgery 4/22   Dispo: The patient is from: Home              Anticipated d/c is to: Home              Anticipated d/c date is: 2 days              Patient currently is not medically stable to d/c.   Consultants:   Ortho  surgery  Procedures: Reduction in ED  Antimicrobials:  Anti-infectives (From admission, onward)   Start     Dose/Rate Route Frequency Ordered Stop   02/23/20 1100  ceFAZolin (ANCEF) IVPB 1 g/50 mL premix     1 g 100 mL/hr over 30 Minutes Intravenous Every 6 hours 02/23/20 1046 02/24/20 0015   02/23/20 0600  ceFAZolin (ANCEF) IVPB 2g/100 mL premix     2 g 200 mL/hr over 30 Minutes Intravenous  On call to O.R. 02/22/20 1936 02/23/20 0840     Subjective: Rough night Didn't feel like his needs well attended to C/o pain  Objective: Vitals:   02/23/20 2352 02/24/20 0403 02/24/20 0824 02/24/20 1602  BP: 129/70 (!) 136/92 (!) 142/86 (!) 141/77  Pulse: 90 84 73 74  Resp: 16 16 18 18   Temp: 97.9 F (36.6 C) 98 F (36.7 C) 97.6 F (36.4 C) (!)  97.5 F (36.4 C)  TempSrc: Oral Oral Oral Oral  SpO2: 97% 98% 95% 94%  Weight:      Height:        Intake/Output Summary (Last 24 hours) at 02/24/2020 1646 Last data filed at 02/24/2020 1455 Gross per 24 hour  Intake 960 ml  Output 800 ml  Net 160 ml   Filed Weights   02/15/20 1654 02/23/20 0721  Weight: 113.4 kg 114 kg    Examination:  General: No acute distress. Cardiovascular: Heart sounds show Daniel Durham regular rate, and rhythm Lungs: Clear to auscultation bilaterally  Abdomen: Soft, nontender, nondistended Neurological: Alert and oriented 3. Moves all extremities 4 with equal strength. Cranial nerves II through XII grossly intact. Skin: Warm and dry. No rashes or lesions. Extremities: LLE with splint   Data Reviewed: I have personally reviewed following labs and imaging studies  CBC: Recent Labs  Lab 02/22/20 0824 02/23/20 0348 02/24/20 0437  WBC 5.6 6.0 14.6*  NEUTROABS 2.9 2.7 12.8*  HGB 14.9 13.9 13.7  HCT 44.3 41.8 40.9  MCV 101.1* 102.7* 102.5*  PLT 188 203 [id number]    Basic Metabolic Panel: Recent Labs  Lab 02/22/20 0824 02/23/20 0348 02/24/20 0437 02/24/20 1417  NA 133* 134* 136  --   K 4.3 4.5 5.3* 4.7  CL 102 101 105  --   CO2 20* 24 23  --   GLUCOSE 129* 120* 255*  --   BUN 13 24* 20  --   CREATININE 0.98 1.32* 1.08  --   CALCIUM 8.7* 8.8* 8.9  --   MG  --  2.5* 2.3  --   PHOS  --  3.8 1.1*  --     GFR: Estimated Creatinine Clearance: 96.5 mL/min (by C-G formula based on SCr of 1.08 mg/dL).  Liver Function Tests: Recent Labs  Lab 02/22/20 0824 02/23/20 0348 02/24/20 0437  AST 63* 54* 43*  ALT 74* 68* 60*  ALKPHOS 76 79 76  BILITOT 0.5 0.6 0.6  PROT 6.8 6.3* 6.5  ALBUMIN 3.6 3.4* 3.4*    CBG: No results for input(s): GLUCAP in the last 168 hours.   Recent Results (from the past 240 hour(s))  SARS Coronavirus 2 by RT PCR     Status: None   Collection Time: 02/16/20  4:37 AM  Result Value Ref Range Status   SARS Coronavirus 2  NEGATIVE NEGATIVE Final    Comment: (NOTE) Result indicates the ABSENCE of SARS-CoV-2 RNA in the patient specimen.  The lowest concentration of SARS-CoV-2 viral copies this assay can detect in nasopharyngeal swab specimens is 500 copies / mL.  Daniel Durham negative result does not preclude SARS-CoV-2 infection and should not be used as the sole basis for patient management decisions. Daniel Durham negative result may occur with improper specimen collection / handling, submission of Daniel Durham specimen other than nasopharyngeal swab, presence of viral mutation(s) within the areas targeted by this assay, and inadequate number of viral copies (<500 copies / mL) present.  Negative results must be combined with clinical observations, patient history, and epidemiological  information.  The expected result is NEGATIVE.  Patient Fact Sheet:  BlogSelections.co.uk   Provider Fact Sheet:  https://lucas.com/   This test is not yet approved or cleared by the [place] FDA and  has been authorized for  detection and/or diagnosis of SARS-CoV-2 by FDA under an Emergency Use Authorization (EUA).  This EUA will remain in effect (meaning this test can be used) for the duration of  the COVID-19 declaration under Section 564(b)(1) of the Act, 21 U.S.C. section 360bbb-3(b)(1), unless the authorization is terminated or revoked sooner Performed at [facility], [place] [address] [place], [place] [address]   Surgical pcr screen     Status: Abnormal   Collection Time: 02/23/20  5:59 AM   Specimen: Nasal Mucosa; Nasal Swab  Result Value Ref Range Status   MRSA, PCR NEGATIVE NEGATIVE Final   Staphylococcus aureus POSITIVE (Daniel Durham) NEGATIVE Final    Comment: (NOTE) The Xpert SA Assay (FDA approved for NASAL specimens in patients 39 years of age and older), is one component of Daniel Durham comprehensive surveillance program. It is not intended to diagnose infection nor to guide or monitor  treatment. Performed at [facility], [place] [address] [place], [place] [address]          Radiology Studies: DG Ankle Complete Left  Result Date: 02/23/2020 CLINICAL DATA:  Intraoperative fluoroscopy EXAM: DG C-ARM 1-60 MIN; LEFT ANKLE COMPLETE - 3+ VIEW CONTRAST:  None FLUOROSCOPY TIME:  Fluoroscopy Time:  Not reported Radiation Exposure Index (if provided by the fluoroscopic device): NA Number of Acquired Spot Images: 4 COMPARISON:  None. FINDINGS: Intraoperative fluoroscopic views of the left ankle are provided for review demonstrating ORIF of the distal fibula and tight rope repair of the distal tibiofibular syndesmosis. IMPRESSION: Intraoperative fluoroscopic views of the left ankle are provided for review demonstrating ORIF of the distal fibula and tight rope repair of the distal tibiofibular syndesmosis. Electronically Signed   By: Daniel Durham M.D.   On: 02/23/2020 12:11   DG C-Arm 1-60 Min  Result Date: 02/23/2020 CLINICAL DATA:  Intraoperative fluoroscopy EXAM: DG C-ARM 1-60 MIN; LEFT ANKLE COMPLETE - 3+ VIEW CONTRAST:  None FLUOROSCOPY TIME:  Fluoroscopy Time:  Not reported Radiation Exposure Index (if provided by the fluoroscopic device): NA Number of Acquired Spot Images: 4 COMPARISON:  None. FINDINGS: Intraoperative fluoroscopic views of the left ankle are provided for review demonstrating ORIF of the distal fibula and tight rope repair of the distal tibiofibular syndesmosis. IMPRESSION: Intraoperative fluoroscopic views of the left ankle are provided for review demonstrating ORIF of the distal fibula and tight rope repair of the distal tibiofibular syndesmosis. Electronically Signed   By: Daniel Durham M.D.   On: 02/23/2020 12:11        Scheduled Meds: . acetaminophen  1,000 mg Oral Q8H  . atorvastatin  10 mg Oral q1800  . docusate sodium  100 mg Oral BID  . folic acid  1 mg Oral Daily  . gabapentin  900 mg Oral BID  . montelukast  10 mg Oral QHS  . multivitamin  with minerals  1 tablet Oral Daily  . polyethylene glycol  17 g Oral BID  . senna-docusate  2 tablet Oral QHS  . sertraline  50 mg Oral Daily  . thiamine  100 mg Oral Daily   Or  . thiamine  100 mg Intravenous Daily   Continuous Infusions: . lactated ringers 10 mL/hr at 02/23/20 0729  . lactated ringers 100 mL/hr at  02/23/20 1848     LOS: 5 days    Time spent: over 82 min    Daniel Helper, MD [facility]   To contact the attending provider between 7A-7P or the covering provider during after hours 7P-7A, please log into the web site [email or web address] and access using universal [place] password for that web site. If you do not have the password, please call the hospital operator.  02/24/2020, 4:46 PM

## 2020-02-25 LAB — COMPREHENSIVE METABOLIC PANEL
ALT: 47 U/L — ABNORMAL HIGH (ref 0–44)
AST: 30 U/L (ref 15–41)
Albumin: 3.3 g/dL — ABNORMAL LOW (ref 3.5–5.0)
Alkaline Phosphatase: 66 U/L (ref 38–126)
Anion gap: 7 (ref 5–15)
BUN: 17 mg/dL (ref 6–20)
CO2: 24 mmol/L (ref 22–32)
Calcium: 8.8 mg/dL — ABNORMAL LOW (ref 8.9–10.3)
Chloride: 107 mmol/L (ref 98–111)
Creatinine, Ser: 0.9 mg/dL (ref 0.61–1.24)
GFR calc Af Amer: >60 mL/min (ref >60)
GFR calc non Af Amer: >60 mL/min (ref >60)
Glucose, Bld: 173 mg/dL — ABNORMAL HIGH (ref 70–99)
Potassium: 4.2 mmol/L (ref 3.5–5.1)
Sodium: 138 mmol/L (ref 135–145)
Total Bilirubin: 0.3 mg/dL (ref 0.3–1.2)
Total Protein: 6.5 g/dL (ref 6.5–8.1)

## 2020-02-25 LAB — CBC WITH DIFFERENTIAL/PLATELET
Abs Immature Granulocytes: 0.05 10*3/uL (ref 0.00–0.07)
Basophils Absolute: 0.1 10*3/uL (ref 0.0–0.1)
Basophils Relative: 1 %
Eosinophils Absolute: 0.1 10*3/uL (ref 0.0–0.5)
Eosinophils Relative: 1 %
HCT: 39.2 % (ref 39.0–52.0)
Hemoglobin: 12.8 g/dL — ABNORMAL LOW (ref 13.0–17.0)
Immature Granulocytes: 1 %
Lymphocytes Relative: 26 %
Lymphs Abs: 2.7 10*3/uL (ref 0.7–4.0)
MCH: 33.6 pg (ref 26.0–34.0)
MCHC: 32.7 g/dL (ref 30.0–36.0)
MCV: 102.9 fL — ABNORMAL HIGH (ref 80.0–100.0)
Monocytes Absolute: 1.2 10*3/uL — ABNORMAL HIGH (ref 0.1–1.0)
Monocytes Relative: 11 %
Neutro Abs: 6.4 10*3/uL (ref 1.7–7.7)
Neutrophils Relative %: 60 %
Platelets: 196 10*3/uL (ref 150–400)
RBC: 3.81 MIL/uL — ABNORMAL LOW (ref 4.22–5.81)
RDW: 12.1 % (ref 11.5–15.5)
WBC: 10.5 10*3/uL (ref 4.0–10.5)
nRBC: 0 % (ref 0.0–0.2)

## 2020-02-25 LAB — PHOSPHORUS: Phosphorus: 2.5 mg/dL (ref 2.5–4.6)

## 2020-02-25 LAB — MAGNESIUM: Magnesium: 2.2 mg/dL (ref 1.7–2.4)

## 2020-02-25 NOTE — Plan of Care (Signed)
  Problem: Education: Goal: Knowledge of General Education information will improve Description: Including pain rating scale, medication(s)/side effects and non-pharmacologic comfort measures Outcome: Progressing   Problem: Health Behavior/Discharge Planning: Goal: Ability to manage health-related needs will improve Outcome: Progressing   Problem: Clinical Measurements: Goal: Will remain free from infection Outcome: Progressing   Problem: Elimination: Goal: Will not experience complications related to bowel motility Outcome: Progressing   Problem: Pain Managment: Goal: General experience of comfort will improve Outcome: Progressing   Problem: Safety: Goal: Ability to remain free from injury will improve Outcome: Progressing   Problem: Skin Integrity: Goal: Risk for impaired skin integrity will decrease Outcome: Progressing

## 2020-02-25 NOTE — Progress Notes (Signed)
PROGRESS NOTE    Daniel Durham  [id number] DOB: Jun 21, 1966 DOA: 02/15/2020 PCP: Daniel Jeans, PA-C   Chief Complaint  Patient presents with  . Fall    Brief Narrative:  54 year old male with history of chronic back pain, asthma, EtOH use, LUE amputation and HTN presenting after mechanical fall with head impaction and brief LOC, and left ankle fracture as noted on x-ray.    In ED, left ankle x-ray revealed revealed trimalleolar fracture with tiny osseous fragments seen on the medial anterior talar dome.  Patient had reduction in ED.  Orthopedic surgery, Dr. Stann Durham consulted, and initially recommended outpatient follow-up in [name] week for surgery when the swelling improves.  However, patient with unstable fracture,  significant pain and has LUE amputation to be discharged home safely.  So he was admitted for adequate pain control and surgical intervention on 02/23/2020.  CT head, C-spine and L-spine without significant finding.  COVID-19 PCR negative.  Assessment & Plan:   Principal Problem:   Ankle fracture, bimalleolar, closed, left, initial encounter Active Problems:   HTN (hypertension)   Closed left ankle fracture  Mechanical fall at home Left trimalleolar fracture Left wrist pain-x-ray without significant finding. - s/p reduction in the ED -Continue current pain regimen for pain control with bowel regimen. -Continue leg elevation -s/p open treatment of L ankle fracture with internal fixation and open reduction internal fixation of L syndesmotic disruption and intraoperative stress radiography on 4/22 - appreciate ortho assistance -> will need dvt ppx at discharge (allergic to asa) - Pain management - therapy recommending CIR - awaiting consult  Acute Kidney Injury: improved, continue to monitor  Essential hypertension: Normotensive -Continue home lisinopril - hold prior to surgery, continue to hold for now  Alcohol abuse -CIWA monitoring with Ativan -Continue  vitamins -Cessation counseling.  Elevated liver enzymes: Mild, continue to monitor.  History of asthma: On DuoNeb at home. -Continue Singulair and as needed DuoNeb  Chronic pain syndrome: On Percocet, gabapentin, Zanaflex -Continue as needed oxycodone, gabapentin and Flexeril (IV morphine for breakthrough) -Tylenol 1 g 3 times daily -As needed MiraLAX and Senokot-S  RUQ pain: Seems to have resolved.  CT abdomen without significant finding.  Mild LFT resolved. -Continue monitoring -surgery rec f/u outpatient in clinic for elective outpatient gallbladder  Class II obesity: Body mass index is 39.16 kg/m. -Encourage lifestyle change to lose weight.  DVT prophylaxis: SCD Code Status: full Family Communication: wife, mother in law at bedside Disposition:   Status is: Inpatient  Remains inpatient appropriate because:IV treatments appropriate due to intensity of illness or inability to take PO, Inpatient level of care appropriate due to severity of illness and plan for surgery 4/22   Dispo: The patient is from: Home              Anticipated d/c is to: Home              Anticipated d/c date is: 2 days              Patient currently is not medically stable to d/c.   Consultants:   Ortho  surgery  Procedures: Reduction in ED  Antimicrobials:  Anti-infectives (From admission, onward)   Start     Dose/Rate Route Frequency Ordered Stop   02/23/20 1100  ceFAZolin (ANCEF) IVPB 1 g/50 mL premix     1 g 100 mL/hr over 30 Minutes Intravenous Every 6 hours 02/23/20 1046 02/24/20 0015   02/23/20 0600  ceFAZolin (ANCEF) IVPB 2g/100 mL premix  2 g 200 mL/hr over 30 Minutes Intravenous On call to O.R. 02/22/20 1936 02/23/20 0840     Subjective: No new complaints today Continues to have LLE pain, starting to get some sensation back   Objective: Vitals:   02/24/20 2122 02/25/20 0420 02/25/20 0850 02/25/20 1300  BP: 135/76 125/83 135/84 136/82  Pulse: 74 72 79 80  Resp:  16 17 18 18   Temp: 97.9 F (36.6 C) 97.6 F (36.4 C) 98 F (36.7 C) 97.9 F (36.6 C)  TempSrc: Oral Oral Oral Oral  SpO2: 96% 95% 95% 97%  Weight:      Height:        Intake/Output Summary (Last 24 hours) at 02/25/2020 1429 Last data filed at 02/25/2020 0900 Gross per 24 hour  Intake 1440 ml  Output 3350 ml  Net -1910 ml   Filed Weights   02/15/20 1654 02/23/20 0721  Weight: 113.4 kg 114 kg    Examination:  General: No acute distress. Cardiovascular: Heart sounds show Daniel Durham regular rate, and rhythm.  Lungs: Clear to auscultation bilaterally Abdomen: Soft, nontender, nondistended  Neurological: Alert and oriented 3. Moves all extremities 4 . Cranial nerves II through XII grossly intact. Skin: Warm and dry. No rashes or lesions. Extremities: LLE in splint    Data Reviewed: I have personally reviewed following labs and imaging studies  CBC: Recent Labs  Lab 02/22/20 0824 02/23/20 0348 02/24/20 0437 02/25/20 0421  WBC 5.6 6.0 14.6* 10.5  NEUTROABS 2.9 2.7 12.8* 6.4  HGB 14.9 13.9 13.7 12.8*  HCT 44.3 41.8 40.9 39.2  MCV 101.1* 102.7* 102.5* 102.9*  PLT 188 203 214 [id number]    Basic Metabolic Panel: Recent Labs  Lab 02/22/20 0824 02/23/20 0348 02/24/20 0437 02/24/20 1417 02/25/20 0421  NA 133* 134* 136  --  138  K 4.3 4.5 5.3* 4.7 4.2  CL 102 101 105  --  107  CO2 20* 24 23  --  24  GLUCOSE 129* 120* 255*  --  173*  BUN 13 24* 20  --  17  CREATININE 0.98 1.32* 1.08  --  0.90  CALCIUM 8.7* 8.8* 8.9  --  8.8*  MG  --  2.5* 2.3  --  2.2  PHOS  --  3.8 1.1*  --  2.5    GFR: Estimated Creatinine Clearance: 115.8 mL/min (by C-G formula based on SCr of 0.9 mg/dL).  Liver Function Tests: Recent Labs  Lab 02/22/20 0824 02/23/20 0348 02/24/20 0437 02/25/20 0421  AST 63* 54* 43* 30  ALT 74* 68* 60* 47*  ALKPHOS 76 79 76 66  BILITOT 0.5 0.6 0.6 0.3  PROT 6.8 6.3* 6.5 6.5  ALBUMIN 3.6 3.4* 3.4* 3.3*    CBG: No results for input(s): GLUCAP in the last  168 hours.   Recent Results (from the past 240 hour(s))  SARS Coronavirus 2 by RT PCR     Status: None   Collection Time: 02/16/20  4:37 AM  Result Value Ref Range Status   SARS Coronavirus 2 NEGATIVE NEGATIVE Final    Comment: (NOTE) Result indicates the ABSENCE of SARS-CoV-2 RNA in the patient specimen.  The lowest concentration of SARS-CoV-2 viral copies this assay can detect in nasopharyngeal swab specimens is 500 copies / mL.  Nole Robey negative result does not preclude SARS-CoV-2 infection and should not be used as the sole basis for patient management decisions. Daneshia Tavano negative result may occur with improper specimen collection / handling, submission of Kyre Jeffries specimen other than nasopharyngeal  swab, presence of viral mutation(s) within the areas targeted by this assay, and inadequate number of viral copies (<500 copies / mL) present.  Negative results must be combined with clinical observations, patient history, and epidemiological information.  The expected result is NEGATIVE.  Patient Fact Sheet:  BlogSelections.co.uk   Provider Fact Sheet:  https://lucas.com/   This test is not yet approved or cleared by the [place] FDA and  has been authorized for  detection and/or diagnosis of SARS-CoV-2 by FDA under an Emergency Use Authorization (EUA).  This EUA will remain in effect (meaning this test can be used) for the duration of  the COVID-19 declaration under Section 564(b)(1) of the Act, 21 U.S.C. section 360bbb-3(b)(1), unless the authorization is terminated or revoked sooner Performed at [facility], [place] [address], [place], [place] [address]   Surgical pcr screen     Status: Abnormal   Collection Time: 02/23/20  5:59 AM   Specimen: Nasal Mucosa; Nasal Swab  Result Value Ref Range Status   MRSA, PCR NEGATIVE NEGATIVE Final   Staphylococcus aureus POSITIVE (Darious Rehman) NEGATIVE Final    Comment: (NOTE) The Xpert SA Assay (FDA approved  for NASAL specimens in patients 60 years of age and older), is one component of Elbert Polyakov comprehensive surveillance program. It is not intended to diagnose infection nor to guide or monitor treatment. Performed at [facility], [place] [address], [place], [place] [address]          Radiology Studies: No results found.      Scheduled Meds: . atorvastatin  10 mg Oral q1800  . docusate sodium  100 mg Oral BID  . enoxaparin (LOVENOX) injection  40 mg Subcutaneous Q24H  . folic acid  1 mg Oral Daily  . gabapentin  900 mg Oral BID  . montelukast  10 mg Oral QHS  . multivitamin with minerals  1 tablet Oral Daily  . polyethylene glycol  17 g Oral BID  . senna-docusate  2 tablet Oral QHS  . sertraline  50 mg Oral Daily  . thiamine  100 mg Oral Daily   Or  . thiamine  100 mg Intravenous Daily   Continuous Infusions: . lactated ringers 10 mL/hr at 02/23/20 0729     LOS: 6 days    Time spent: over 30 min    Fayrene Helper, MD [facility]   To contact the attending provider between 7A-7P or the covering provider during after hours 7P-7A, please log into the web site [email or web address] and access using universal [place] password for that web site. If you do not have the password, please call the hospital operator.  02/25/2020, 2:29 PM

## 2020-02-25 NOTE — Progress Notes (Signed)
Physical Therapy Treatment Patient Details Name: Daniel Durham MRN: [id number] DOB: 1966/05/18 Today's Date: 02/25/2020    History of Present Illness Pt is a 54 y/o male admitted 02/15/20 after fall sustaining L trimalleolar fx, pt reportedly slip and hit his head with brief LOC. S/p L ankle reduction and splinting in ED. Plan for potential sx 4/22 for fixation. PMH includes alcohol use, COVID, HTN, traumatic L forearm amputation.    PT Comments    Patient is making progress toward PT goals and eager to participate in therapy. This session focused on functional transfer/gait training with use of L platform RW. Pt tolerated gait distance of ~20 ft X 2 trials with seated rest break due to DOE. Continue to recommend CIR for further skilled PT services to decrease risk of falls and caregiver burden.     Follow Up Recommendations  CIR     Equipment Recommendations  Wheelchair (measurements PT);Wheelchair cushion (measurements PT);Other (comment);3in1 (PT)(L platform RW and drop arm commode.)    Recommendations for Other Services       Precautions / Restrictions Precautions Precautions: Fall Restrictions Weight Bearing Restrictions: Yes LLE Weight Bearing: Non weight bearing Other Position/Activity Restrictions: L forearm amputation at baseline    Mobility  Bed Mobility Overal bed mobility: Modified Independent Bed Mobility: Supine to Sit;Sit to Supine           General bed mobility comments: HOB elevated when getting EOB; use of rail  Transfers Overall transfer level: Needs assistance Equipment used: Left platform walker Transfers: Sit to/from Stand Sit to Stand: Min guard;Min assist         General transfer comment: cues for safety and assist to steady and stabilize RW   Ambulation/Gait Ambulation/Gait assistance: Min guard;Min assist Gait Distance (Feet): (20 ft X 2 trials with seated rest break) Assistive device: Left platform walker Gait Pattern/deviations:  Step-to pattern Gait velocity: decreased   General Gait Details: assist to steady and manage RW; pt with DOE and reports from recently having COVID    Stairs             Wheelchair Mobility    Modified Rankin (Stroke Patients Only)       Balance Overall balance assessment: Needs assistance Sitting-balance support: Single extremity supported Sitting balance-Leahy Scale: Fair     Standing balance support: Bilateral upper extremity supported Standing balance-Leahy Scale: Poor                              Cognition Arousal/Alertness: Awake/alert Behavior During Therapy: WFL for tasks assessed/performed Overall Cognitive Status: Within Functional Limits for tasks assessed                                 General Comments: a little impulsive to mobilize and cues needed for safety at times      Exercises      General Comments        Pertinent Vitals/Pain Pain Assessment: Faces Faces Pain Scale: Hurts little more Pain Location: L LE in depedent position Pain Descriptors / Indicators: Sharp;Heaviness;Tingling Pain Intervention(s): Limited activity within patient's tolerance;Monitored during session;Repositioned;Premedicated before session    Home Living                      Prior Function            PT Goals (current goals can now be  found in the care plan section) Progress towards PT goals: Progressing toward goals    Frequency    Min 3X/week      PT Plan Current plan remains appropriate    Co-evaluation              AM-PAC PT "6 Clicks" Mobility   Outcome Measure  Help needed turning from your back to your side while in a flat bed without using bedrails?: None Help needed moving from lying on your back to sitting on the side of a flat bed without using bedrails?: A Little Help needed moving to and from a bed to a chair (including a wheelchair)?: A Little Help needed standing up from a chair using your  arms (e.g., wheelchair or bedside chair)?: A Little Help needed to walk in hospital room?: A Little Help needed climbing 3-5 steps with a railing? : Total 6 Click Score: 17    End of Session Equipment Utilized During Treatment: Gait belt Activity Tolerance: Patient limited by fatigue Patient left: with call bell/phone within reach;in bed;with bed alarm set(L LE elevated ) Nurse Communication: Mobility status PT Visit Diagnosis: Difficulty in walking, not elsewhere classified (R26.2);Pain Pain - Right/Left: Left Pain - part of body: Ankle and joints of foot     Time: [id number] PT Time Calculation (min) (ACUTE ONLY): 26 min  Charges:  $Gait Training: 23-37 mins                     Earney Navy, PTA Acute Rehabilitation Services Pager: [phone] Office: [phone]     Darliss Cheney 02/25/2020, 4:24 PM

## 2020-02-25 NOTE — Progress Notes (Signed)
Subjective: 2 Days Post-Op Procedure(s) (LRB): OPEN REDUCTION INTERNAL FIXATION (ORIF) ANKLE FRACTURE (Left) Patient seen in rounds for Dr. Stann Mainland this AM Patient reports pain as 9 on 0-10 scale.   Reports some numbness in foot that is resolving slowly  Objective: Vital signs in last 24 hours: Temp:  [97.5 F (36.4 C)-97.9 F (36.6 C)] 97.6 F (36.4 C) (04/24 0420) Pulse Rate:  [72-74] 72 (04/24 0420) Resp:  [16-18] 17 (04/24 0420) BP: (125-142)/(76-86) 125/83 (04/24 0420) SpO2:  [94 %-96 %] 95 % (04/24 0420)  Intake/Output from previous day: 04/23 0701 - 04/24 0700 In: 1560 [P.O.:1560] Out: 2950 [Urine:2950] Intake/Output this shift: No intake/output data recorded.  Recent Labs    02/22/20 0824 02/23/20 0348 02/24/20 0437 02/25/20 0421  HGB 14.9 13.9 13.7 12.8*   Recent Labs    02/24/20 0437 02/25/20 0421  WBC 14.6* 10.5  RBC 3.99* 3.81*  HCT 40.9 39.2  PLT 214 196   Recent Labs    02/24/20 0437 02/24/20 0437 02/24/20 1417 02/25/20 0421  NA 136  --   --  138  K 5.3*   < > 4.7 4.2  CL 105  --   --  107  CO2 23  --   --  24  BUN 20  --   --  17  CREATININE 1.08  --   --  0.90  GLUCOSE 255*  --   --  173*  CALCIUM 8.9  --   --  8.8*   < > = values in this interval not displayed.   No results for input(s): LABPT, INR in the last 72 hours.  Neurologically intact Neurovascular intact Sensation intact distally Incision: dressing C/D/I Compartment soft Good cap refill   Assessment/Plan: 2 Days Post-Op Procedure(s) (LRB): OPEN REDUCTION INTERNAL FIXATION (ORIF) ANKLE FRACTURE (Left) Advance diet Up with therapy NWB on LLE Elevation when in bed Patient is allergic to Aspirin so will need to be D/C on DVT prophylaxis, been getting Lovenox injections in hospital     [place] 02/25/2020, 7:24 AM

## 2020-02-26 ENCOUNTER — Encounter: Payer: Self-pay | Admitting: *Deleted

## 2020-02-26 LAB — COMPREHENSIVE METABOLIC PANEL
ALT: 46 U/L — ABNORMAL HIGH (ref 0–44)
AST: 30 U/L (ref 15–41)
Albumin: 3.5 g/dL (ref 3.5–5.0)
Alkaline Phosphatase: 78 U/L (ref 38–126)
Anion gap: 10 (ref 5–15)
BUN: 15 mg/dL (ref 6–20)
CO2: 29 mmol/L (ref 22–32)
Calcium: 8.9 mg/dL (ref 8.9–10.3)
Chloride: 100 mmol/L (ref 98–111)
Creatinine, Ser: 0.96 mg/dL (ref 0.61–1.24)
GFR calc Af Amer: >60 mL/min (ref >60)
GFR calc non Af Amer: >60 mL/min (ref >60)
Glucose, Bld: 131 mg/dL — ABNORMAL HIGH (ref 70–99)
Potassium: 3.9 mmol/L (ref 3.5–5.1)
Sodium: 139 mmol/L (ref 135–145)
Total Bilirubin: 0.3 mg/dL (ref 0.3–1.2)
Total Protein: 6.7 g/dL (ref 6.5–8.1)

## 2020-02-26 LAB — CBC WITH DIFFERENTIAL/PLATELET
Abs Immature Granulocytes: 0.05 10*3/uL (ref 0.00–0.07)
Basophils Absolute: 0.1 10*3/uL (ref 0.0–0.1)
Basophils Relative: 1 %
Eosinophils Absolute: 0.2 10*3/uL (ref 0.0–0.5)
Eosinophils Relative: 3 %
HCT: 43.1 % (ref 39.0–52.0)
Hemoglobin: 14 g/dL (ref 13.0–17.0)
Immature Granulocytes: 1 %
Lymphocytes Relative: 36 %
Lymphs Abs: 3 10*3/uL (ref 0.7–4.0)
MCH: 33.7 pg (ref 26.0–34.0)
MCHC: 32.5 g/dL (ref 30.0–36.0)
MCV: 103.9 fL — ABNORMAL HIGH (ref 80.0–100.0)
Monocytes Absolute: 1.2 10*3/uL — ABNORMAL HIGH (ref 0.1–1.0)
Monocytes Relative: 14 %
Neutro Abs: 3.9 10*3/uL (ref 1.7–7.7)
Neutrophils Relative %: 45 %
Platelets: 239 10*3/uL (ref 150–400)
RBC: 4.15 MIL/uL — ABNORMAL LOW (ref 4.22–5.81)
RDW: 12.1 % (ref 11.5–15.5)
WBC: 8.5 10*3/uL (ref 4.0–10.5)
nRBC: 0 % (ref 0.0–0.2)

## 2020-02-26 LAB — PHOSPHORUS: Phosphorus: 3.7 mg/dL (ref 2.5–4.6)

## 2020-02-26 LAB — MAGNESIUM: Magnesium: 2.2 mg/dL (ref 1.7–2.4)

## 2020-02-26 NOTE — Plan of Care (Signed)
  Problem: Activity: Goal: Risk for activity intolerance will decrease Outcome: Progressing   

## 2020-02-26 NOTE — Plan of Care (Signed)
  Problem: Education: Goal: Knowledge of General Education information will improve Description: Including pain rating scale, medication(s)/side effects and non-pharmacologic comfort measures Outcome: Progressing   Problem: Health Behavior/Discharge Planning: Goal: Ability to manage health-related needs will improve Outcome: Progressing   Problem: Clinical Measurements: Goal: Will remain free from infection Outcome: Progressing   Problem: Pain Managment: Goal: General experience of comfort will improve Outcome: Progressing   Problem: Safety: Goal: Ability to remain free from injury will improve Outcome: Progressing   Problem: Skin Integrity: Goal: Risk for impaired skin integrity will decrease Outcome: Progressing

## 2020-02-26 NOTE — Progress Notes (Signed)
Subjective: 3 Days Post-Op Procedure(s) (LRB): OPEN REDUCTION INTERNAL FIXATION (ORIF) ANKLE FRACTURE (Left) Patient reports pain as moderate.  Pain meds helping. No N/V. C/o pain at ankle. No other c/o. Objective: Vital signs in last 24 hours: Temp:  [97.5 F (36.4 C)-98.3 F (36.8 C)] 97.5 F (36.4 C) (04/25 0411) Pulse Rate:  [70-80] 70 (04/25 0411) Resp:  [14-18] 16 (04/25 0411) BP: (123-136)/(64-82) 130/64 (04/25 0411) SpO2:  [97 %-98 %] 97 % (04/25 0411)  Intake/Output from previous day: 04/24 0701 - 04/25 0700 In: 2040 [P.O.:2040] Out: 1800 [Urine:1800] Intake/Output this shift: No intake/output data recorded.  Recent Labs    02/24/20 0437 02/25/20 0421 02/26/20 0705  HGB 13.7 12.8* 14.0   Recent Labs    02/25/20 0421 02/26/20 0705  WBC 10.5 8.5  RBC 3.81* 4.15*  HCT 39.2 43.1  PLT 196 239   Recent Labs    02/24/20 0437 02/24/20 0437 02/24/20 1417 02/25/20 0421  NA 136  --   --  138  K 5.3*   < > 4.7 4.2  CL 105  --   --  107  CO2 23  --   --  24  BUN 20  --   --  17  CREATININE 1.08  --   --  0.90  GLUCOSE 255*  --   --  173*  CALCIUM 8.9  --   --  8.8*   < > = values in this interval not displayed.   No results for input(s): LABPT, INR in the last 72 hours.  Neurologically intact ABD soft Neurovascular intact Sensation intact distally Intact pulses distally Dorsiflexion/Plantar flexion intact Incision: dressing C/D/I and no drainage No cellulitis present Compartment soft no calf pain or sign of DVT   Assessment/Plan: 3 Days Post-Op Procedure(s) (LRB): OPEN REDUCTION INTERNAL FIXATION (ORIF) ANKLE FRACTURE (Left) Advance diet Up with therapy D/C IV fluids NWB LLE DVT ppx- allergy to ASA Awaiting CIR   Daniel Durham 02/26/2020, 9:07 AM

## 2020-02-26 NOTE — Progress Notes (Signed)
PROGRESS NOTE    Daniel Durham  [id number] DOB: 30-Aug-1966 DOA: 02/15/2020 PCP: Brunetta Jeans, PA-C   Chief Complaint  Patient presents with  . Fall    Brief Narrative:  54 year old male with history of chronic back pain, asthma, EtOH use, LUE amputation and HTN presenting after mechanical fall with head impaction and brief LOC, and left ankle fracture as noted on x-ray.    In ED, left ankle x-ray revealed revealed trimalleolar fracture with tiny osseous fragments seen on the medial anterior talar dome.  Patient had reduction in ED.  Orthopedic surgery, Dr. Stann Mainland consulted, and initially recommended outpatient follow-up in [place] [facility] week for surgery when the swelling improves.  However, patient with unstable fracture,  significant pain and has LUE amputation to be discharged home safely.  So he was admitted for adequate pain control and surgical intervention on 02/23/2020.  CT head, C-spine and L-spine without significant finding.  COVID-19 PCR negative.  Assessment & Plan:   Principal Problem:   Ankle fracture, bimalleolar, closed, left, initial encounter Active Problems:   HTN (hypertension)   Closed left ankle fracture  Mechanical fall at home Left trimalleolar fracture Left wrist pain-x-ray without significant finding. - s/p reduction in the ED -Continue current pain regimen for pain control with bowel regimen. -Continue leg elevation -s/p open treatment of L ankle fracture with internal fixation and open reduction internal fixation of L syndesmotic disruption and intraoperative stress radiography on 4/22 - appreciate ortho assistance -> will need dvt ppx at discharge (allergic to asa) - Pain management - therapy recommending CIR - awaiting consult  Acute Kidney Injury: improved, continue to monitor  Essential hypertension: Normotensive -Continue home lisinopril - hold prior to surgery, continue to hold for now  Alcohol abuse -CIWA monitoring with Ativan -Continue  vitamins -Cessation counseling.  Elevated liver enzymes: Mild, continue to monitor.  History of asthma: On DuoNeb at home. -Continue Singulair and as needed DuoNeb  Chronic pain syndrome: On Percocet, gabapentin, Zanaflex -Continue as needed oxycodone, gabapentin and Flexeril (IV morphine for breakthrough) -Tylenol 1 g 3 times daily -As needed MiraLAX and Senokot-S  RUQ pain: Seems to have resolved.  CT abdomen without significant finding.  Mild LFT resolved. -Continue monitoring -surgery rec f/u outpatient in clinic for elective outpatient gallbladder  Class II obesity: Body mass index is 39.16 kg/m. -Encourage lifestyle change to lose weight.  DVT prophylaxis: SCD Code Status: full Family Communication: wife, mother in law at bedside Disposition:   Status is: Inpatient  Remains inpatient appropriate because:IV treatments appropriate due to intensity of illness or inability to take PO, Inpatient level of care appropriate due to severity of illness and plan for surgery 4/22   Dispo: The patient is from: Home              Anticipated d/c is to: Home              Anticipated d/c date is: 2 days              Patient currently is not medically stable to d/c.   Consultants:   Ortho  surgery  Procedures: Reduction in ED  Antimicrobials:  Anti-infectives (From admission, onward)   Start     Dose/Rate Route Frequency Ordered Stop   02/23/20 1100  ceFAZolin (ANCEF) IVPB 1 g/50 mL premix     1 g 100 mL/hr over 30 Minutes Intravenous Every 6 hours 02/23/20 1046 02/24/20 0015   02/23/20 0600  ceFAZolin (ANCEF) IVPB 2g/100 mL premix  2 g 200 mL/hr over 30 Minutes Intravenous On call to O.R. 02/22/20 1936 02/23/20 0840     Subjective: No new complaints Pain is ok with current regimen  Objective: Vitals:   02/25/20 0850 02/25/20 1300 02/25/20 1925 02/26/20 0411  BP: 135/84 136/82 123/67 130/64  Pulse: 79 80 70 70  Resp: 18 18 14 16   Temp: 98 F (36.7 C)  97.9 F (36.6 C) 98.3 F (36.8 C) (!) 97.5 F (36.4 C)  TempSrc: Oral Oral Oral Oral  SpO2: 95% 97% 98% 97%  Weight:      Height:        Intake/Output Summary (Last 24 hours) at 02/26/2020 1424 Last data filed at 02/26/2020 1000 Gross per 24 hour  Intake 1680 ml  Output 1150 ml  Net 530 ml   Filed Weights   02/15/20 1654 02/23/20 0721  Weight: 113.4 kg 114 kg    Examination:  General: No acute distress. Cardiovascular: Heart sounds show Daniel Durham regular rate, and rhythm. Lungs: Clear to auscultation bilaterally Abdomen: Soft, nontender, nondistended  Neurological: Alert and oriented 3. Moves all extremities 4. Cranial nerves II through XII grossly intact. Skin: Warm and dry. No rashes or lesions. Extremities: LLE in splint     Data Reviewed: I have personally reviewed following labs and imaging studies  CBC: Recent Labs  Lab 02/22/20 0824 02/23/20 0348 02/24/20 0437 02/25/20 0421 02/26/20 0705  WBC 5.6 6.0 14.6* 10.5 8.5  NEUTROABS 2.9 2.7 12.8* 6.4 3.9  HGB 14.9 13.9 13.7 12.8* 14.0  HCT 44.3 41.8 40.9 39.2 43.1  MCV 101.1* 102.7* 102.5* 102.9* 103.9*  PLT 188 203 214 196 [id number]    Basic Metabolic Panel: Recent Labs  Lab 02/22/20 0824 02/22/20 0824 02/23/20 0348 02/24/20 0437 02/24/20 1417 02/25/20 0421 02/26/20 0705  NA 133*  --  134* 136  --  138 139  K 4.3   < > 4.5 5.3* 4.7 4.2 3.9  CL 102  --  101 105  --  107 100  CO2 20*  --  24 23  --  24 29  GLUCOSE 129*  --  120* 255*  --  173* 131*  BUN 13  --  24* 20  --  17 15  CREATININE 0.98  --  1.32* 1.08  --  0.90 0.96  CALCIUM 8.7*  --  8.8* 8.9  --  8.8* 8.9  MG  --   --  2.5* 2.3  --  2.2 2.2  PHOS  --   --  3.8 1.1*  --  2.5 3.7   < > = values in this interval not displayed.    GFR: Estimated Creatinine Clearance: 108.6 mL/min (by C-G formula based on SCr of 0.96 mg/dL).  Liver Function Tests: Recent Labs  Lab 02/22/20 0824 02/23/20 0348 02/24/20 0437 02/25/20 0421 02/26/20 0705  AST  63* 54* 43* 30 30  ALT 74* 68* 60* 47* 46*  ALKPHOS 76 79 76 66 78  BILITOT 0.5 0.6 0.6 0.3 0.3  PROT 6.8 6.3* 6.5 6.5 6.7  ALBUMIN 3.6 3.4* 3.4* 3.3* 3.5    CBG: No results for input(s): GLUCAP in the last 168 hours.   Recent Results (from the past 240 hour(s))  Surgical pcr screen     Status: Abnormal   Collection Time: 02/23/20  5:59 AM   Specimen: Nasal Mucosa; Nasal Swab  Result Value Ref Range Status   MRSA, PCR NEGATIVE NEGATIVE Final   Staphylococcus aureus POSITIVE (Daniel Durham) NEGATIVE Final  Comment: (NOTE) The Xpert SA Assay (FDA approved for NASAL specimens in patients 51 years of age and older), is one component of Daniel Durham comprehensive surveillance program. It is not intended to diagnose infection nor to guide or monitor treatment. Performed at [facility], [place] [address] [place], [place] [address]          Radiology Studies: No results found.      Scheduled Meds: . atorvastatin  10 mg Oral q1800  . docusate sodium  100 mg Oral BID  . enoxaparin (LOVENOX) injection  40 mg Subcutaneous Q24H  . folic acid  1 mg Oral Daily  . gabapentin  900 mg Oral BID  . montelukast  10 mg Oral QHS  . multivitamin with minerals  1 tablet Oral Daily  . polyethylene glycol  17 g Oral BID  . senna-docusate  2 tablet Oral QHS  . sertraline  50 mg Oral Daily  . thiamine  100 mg Oral Daily   Or  . thiamine  100 mg Intravenous Daily   Continuous Infusions: . lactated ringers 10 mL/hr at 02/23/20 0729     LOS: 7 days    Time spent: over 30 min    Fayrene Helper, MD [facility]   To contact the attending provider between 7A-7P or the covering provider during after hours 7P-7A, please log into the web site [email or web address] and access using universal [place] password for that web site. If you do not have the password, please call the hospital operator.  02/26/2020, 2:24 PM

## 2020-02-27 ENCOUNTER — Encounter (HOSPITAL_COMMUNITY): Payer: Self-pay | Admitting: Internal Medicine

## 2020-02-27 LAB — CBC WITH DIFFERENTIAL/PLATELET
Abs Immature Granulocytes: 0.05 10*3/uL (ref 0.00–0.07)
Basophils Absolute: 0.1 10*3/uL (ref 0.0–0.1)
Basophils Relative: 1 %
Eosinophils Absolute: 0.3 10*3/uL (ref 0.0–0.5)
Eosinophils Relative: 4 %
HCT: 43.2 % (ref 39.0–52.0)
Hemoglobin: 14.5 g/dL (ref 13.0–17.0)
Immature Granulocytes: 1 %
Lymphocytes Relative: 33 %
Lymphs Abs: 2.7 10*3/uL (ref 0.7–4.0)
MCH: 34.1 pg — ABNORMAL HIGH (ref 26.0–34.0)
MCHC: 33.6 g/dL (ref 30.0–36.0)
MCV: 101.6 fL — ABNORMAL HIGH (ref 80.0–100.0)
Monocytes Absolute: 1 10*3/uL (ref 0.1–1.0)
Monocytes Relative: 12 %
Neutro Abs: 4.2 10*3/uL (ref 1.7–7.7)
Neutrophils Relative %: 49 %
Platelets: 259 10*3/uL (ref 150–400)
RBC: 4.25 MIL/uL (ref 4.22–5.81)
RDW: 12 % (ref 11.5–15.5)
WBC: 8.3 10*3/uL (ref 4.0–10.5)
nRBC: 0 % (ref 0.0–0.2)

## 2020-02-27 LAB — COMPREHENSIVE METABOLIC PANEL
ALT: 51 U/L — ABNORMAL HIGH (ref 0–44)
AST: 37 U/L (ref 15–41)
Albumin: 3.6 g/dL (ref 3.5–5.0)
Alkaline Phosphatase: 85 U/L (ref 38–126)
Anion gap: 9 (ref 5–15)
BUN: 15 mg/dL (ref 6–20)
CO2: 30 mmol/L (ref 22–32)
Calcium: 9.2 mg/dL (ref 8.9–10.3)
Chloride: 100 mmol/L (ref 98–111)
Creatinine, Ser: 1.11 mg/dL (ref 0.61–1.24)
GFR calc Af Amer: >60 mL/min (ref >60)
GFR calc non Af Amer: >60 mL/min (ref >60)
Glucose, Bld: 144 mg/dL — ABNORMAL HIGH (ref 70–99)
Potassium: 4.3 mmol/L (ref 3.5–5.1)
Sodium: 139 mmol/L (ref 135–145)
Total Bilirubin: 0.4 mg/dL (ref 0.3–1.2)
Total Protein: 7 g/dL (ref 6.5–8.1)

## 2020-02-27 LAB — PHOSPHORUS: Phosphorus: 4.9 mg/dL — ABNORMAL HIGH (ref 2.5–4.6)

## 2020-02-27 LAB — MAGNESIUM: Magnesium: 2.3 mg/dL (ref 1.7–2.4)

## 2020-02-27 MED ORDER — MORPHINE SULFATE (PF) 4 MG/ML IV SOLN: 4.0000 mg | Freq: Four times a day (QID) | INTRAVENOUS | Status: DC | PRN

## 2020-02-27 MED ORDER — MUPIROCIN 2 % EX OINT
1.0000 "application " | TOPICAL_OINTMENT | Freq: Two times a day (BID) | CUTANEOUS | Status: DC
  Administered 2020-02-27 – 2020-02-28 (×2): 1 via NASAL
  Filled 2020-02-27: qty 22

## 2020-02-27 MED ORDER — CHLORHEXIDINE GLUCONATE CLOTH 2 % EX PADS
6.0000 | MEDICATED_PAD | Freq: Every day | CUTANEOUS | Status: DC
  Administered 2020-02-27 – 2020-02-28 (×2): 6 via TOPICAL

## 2020-02-27 MED ORDER — MORPHINE SULFATE (PF) 4 MG/ML IV SOLN: 4.0000 mg | Freq: Three times a day (TID) | INTRAVENOUS | Status: DC | PRN

## 2020-02-27 NOTE — Progress Notes (Signed)
Inpatient Rehab Admissions Coordinator Note:   Met with patient at bedside to discuss possible CIR admits. Pt. Expressed interest in program and gave permission to call his wife to confirm support after discharge. Will open insurance today. Will continue to follow for possible CIR admit pending insurance approval and bed availability.   Clemens Catholic, [place], [place] Admissions Coordinator  [phone] ([place]) [phone] (office)

## 2020-02-27 NOTE — Progress Notes (Signed)
PROGRESS NOTE    Daniel Durham  [id number] DOB: October 20, 1966 DOA: 02/15/2020 PCP: Brunetta Jeans, PA-C   Chief Complaint  Patient presents with  . Fall    Brief Narrative:  54 year old male with history of chronic back pain, asthma, EtOH use, LUE amputation and HTN presenting after mechanical fall with head impaction and brief LOC, and left ankle fracture as noted on x-ray.    In ED, left ankle x-ray revealed revealed trimalleolar fracture with tiny osseous fragments seen on the medial anterior talar dome.  Patient had reduction in ED.  Orthopedic surgery, Dr. Stann Mainland consulted, and initially recommended outpatient follow-up in a week for surgery when the swelling improves.  However, patient with unstable fracture,  significant pain and has LUE amputation to be discharged home safely.  So he was admitted for adequate pain control and surgical intervention on 02/23/2020.  CT head, C-spine and L-spine without significant finding.  COVID-19 PCR negative.  Assessment & Plan:   Principal Problem:   Ankle fracture, bimalleolar, closed, left, initial encounter Active Problems:   HTN (hypertension)   Closed left ankle fracture  Mechanical fall at home Left trimalleolar fracture Left wrist pain-x-ray without significant finding. - s/p reduction in the ED -Continue current pain regimen for pain control with bowel regimen. -Continue leg elevation -s/p open treatment of L ankle fracture with internal fixation and open reduction internal fixation of L syndesmotic disruption and intraoperative stress radiography on 4/22 - appreciate ortho assistance -> will need dvt ppx at discharge (allergic to asa) - Pain management - therapy recommending CIR - awaiting consult  Acute Kidney Injury: improved, continue to monitor - creatinine fluctuating  Essential hypertension: Normotensive -Continue home lisinopril - hold prior to surgery, continue to hold for now  Alcohol abuse -CIWA  monitoring with Ativan -Continue vitamins -Cessation counseling.  Elevated liver enzymes: Mild, continue to monitor.  History of asthma: On DuoNeb at home. -Continue Singulair and as needed DuoNeb  Chronic pain syndrome: On Percocet, gabapentin, Zanaflex -Continue as needed oxycodone, gabapentin and Flexeril (IV morphine for breakthrough) -Tylenol 1 g 3 times daily -As needed MiraLAX and Senokot-S  RUQ pain: Seems to have resolved.  CT abdomen without significant finding.  Mild LFT resolved. -Continue monitoring -surgery rec f/u outpatient in clinic for elective outpatient gallbladder  Class II obesity: Body mass index is 39.16 kg/m. -Encourage lifestyle change to lose weight.  DVT prophylaxis: SCD Code Status: full Family Communication: wife, mother in law at bedside Disposition:   Status is: Inpatient  Remains inpatient appropriate because:IV treatments appropriate due to intensity of illness or inability to take PO, Inpatient level of care appropriate due to severity of illness and plan for surgery 4/22   Dispo: The patient is from: Home              Anticipated d/c is to: Home              Anticipated d/c date is: 2 days              Patient currently is not medically stable to d/c.   Consultants:   Ortho  surgery  Procedures: Reduction in ED  Antimicrobials:  Anti-infectives (From admission, onward)   Start     Dose/Rate Route Frequency Ordered Stop   02/23/20 1100  ceFAZolin (ANCEF) IVPB 1 g/50 mL premix     1 g 100 mL/hr over 30 Minutes Intravenous Every 6 hours 02/23/20 1046 02/24/20 0015   02/23/20 0600  ceFAZolin (ANCEF) IVPB  2g/100 mL premix     2 g 200 mL/hr over 30 Minutes Intravenous On call to O.R. 02/22/20 1936 02/23/20 0840     Subjective: Feeling better  Objective: Vitals:   02/26/20 0411 02/26/20 1433 02/26/20 2057 02/27/20 0351  BP: 130/64 (!) 122/57 137/81 130/73  Pulse: 70 72 77 76  Resp: 16 15 16 15   Temp: (!) 97.5 F (36.4  C) 98.2 F (36.8 C) 98.2 F (36.8 C) 98.2 F (36.8 C)  TempSrc: Oral Oral Oral Oral  SpO2: 97% 95% 97% 95%  Weight:      Height:        Intake/Output Summary (Last 24 hours) at 02/27/2020 1156 Last data filed at 02/27/2020 1000 Gross per 24 hour  Intake 1680 ml  Output 1300 ml  Net 380 ml   Filed Weights   02/15/20 1654 02/23/20 0721  Weight: 113.4 kg 114 kg    Examination:  General: No acute distress. Cardiovascular: Heart sounds show a regular rate, and rhythm.  Lungs: Clear to auscultation bilaterally Abdomen: Soft, nontender, nondistended Neurological: Alert and oriented 3. Moves all extremities 4. Cranial nerves II through XII grossly intact. Skin: Warm and dry. No rashes or lesions. Extremities: LLE in dressing      Data Reviewed: I have personally reviewed following labs and imaging studies  CBC: Recent Labs  Lab 02/23/20 0348 02/24/20 0437 02/25/20 0421 02/26/20 0705 02/27/20 0358  WBC 6.0 14.6* 10.5 8.5 8.3  NEUTROABS 2.7 12.8* 6.4 3.9 4.2  HGB 13.9 13.7 12.8* 14.0 14.5  HCT 41.8 40.9 39.2 43.1 43.2  MCV 102.7* 102.5* 102.9* 103.9* 101.6*  PLT 203 214 196 239 [id number]    Basic Metabolic Panel: Recent Labs  Lab 02/23/20 0348 02/23/20 0348 02/24/20 0437 02/24/20 1417 02/25/20 0421 02/26/20 0705 02/27/20 0358  NA 134*  --  136  --  138 139 139  K 4.5   < > 5.3* 4.7 4.2 3.9 4.3  CL 101  --  105  --  107 100 100  CO2 24  --  23  --  24 29 30   GLUCOSE 120*  --  255*  --  173* 131* 144*  BUN 24*  --  20  --  17 15 15   CREATININE 1.32*  --  1.08  --  0.90 0.96 1.11  CALCIUM 8.8*  --  8.9  --  8.8* 8.9 9.2  MG 2.5*  --  2.3  --  2.2 2.2 2.3  PHOS 3.8  --  1.1*  --  2.5 3.7 4.9*   < > = values in this interval not displayed.    GFR: Estimated Creatinine Clearance: 93.9 mL/min (by C-G formula based on SCr of 1.11 mg/dL).  Liver Function Tests: Recent Labs  Lab 02/23/20 0348 02/24/20 0437 02/25/20 0421 02/26/20 0705 02/27/20 0358  AST  54* 43* 30 30 37  ALT 68* 60* 47* 46* 51*  ALKPHOS 79 76 66 78 85  BILITOT 0.6 0.6 0.3 0.3 0.4  PROT 6.3* 6.5 6.5 6.7 7.0  ALBUMIN 3.4* 3.4* 3.3* 3.5 3.6    CBG: No results for input(s): GLUCAP in the last 168 hours.   Recent Results (from the past 240 hour(s))  Surgical pcr screen     Status: Abnormal   Collection Time: 02/23/20  5:59 AM   Specimen: Nasal Mucosa; Nasal Swab  Result Value Ref Range Status   MRSA, PCR NEGATIVE NEGATIVE Final   Staphylococcus aureus POSITIVE (A) NEGATIVE Final  Comment: (NOTE) The Xpert SA Assay (FDA approved for NASAL specimens in patients 55 years of age and older), is one component of a comprehensive surveillance program. It is not intended to diagnose infection nor to guide or monitor treatment. Performed at [facility], [place] [address] [place], [place] [address]          Radiology Studies: No results found.      Scheduled Meds: . atorvastatin  10 mg Oral q1800  . docusate sodium  100 mg Oral BID  . enoxaparin (LOVENOX) injection  40 mg Subcutaneous Q24H  . folic acid  1 mg Oral Daily  . gabapentin  900 mg Oral BID  . montelukast  10 mg Oral QHS  . multivitamin with minerals  1 tablet Oral Daily  . polyethylene glycol  17 g Oral BID  . senna-docusate  2 tablet Oral QHS  . sertraline  50 mg Oral Daily  . thiamine  100 mg Oral Daily   Or  . thiamine  100 mg Intravenous Daily   Continuous Infusions: . lactated ringers 10 mL/hr at 02/23/20 0729     LOS: 8 days    Time spent: over 30 min    Fayrene Helper, MD [facility]   To contact the attending provider between 7A-7P or the covering provider during after hours 7P-7A, please log into the web site [email or web address] and access using universal [place] password for that web site. If you do not have the password, please call the hospital operator.  02/27/2020, 11:56 AM

## 2020-02-27 NOTE — Progress Notes (Signed)
Physical Therapy Treatment Patient Details Name: Daniel Durham MRN: [id number] DOB: [birth year] Today's Date: 02/27/2020    History of Present Illness Pt is a 54 y/o male admitted 02/15/20 after fall sustaining L trimalleolar fx, pt reportedly slip and hit his head with brief LOC. S/p L ankle reduction and splinting in ED. Plan for potential sx 4/22 for fixation. PMH includes alcohol use, COVID, HTN, traumatic L forearm amputation.    PT Comments    Pt supine in bed on arrival.  He is eager to mobilize this session.  Pt required significant decrease in functional mobility.  Based on his presentation will update recommendations to HHPT for home safety eval.  Educated on frequency of HEP and HEP issued for home use.  He will require the following DME listed below.  Informed CM, CIR AC, MD and day shift nurse of change in recommendations.  Will inform supervising PT of change in recommendations for co signature .    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 Restrictions Weight Bearing Restrictions: Yes Other Position/Activity Restrictions: L forearm amputation at baseline    Mobility  Bed Mobility Overal bed mobility: Modified Independent Bed Mobility: Supine to Sit;Sit to Supine     Supine to sit: Modified independent (Device/Increase time) Sit to supine: Modified independent (Device/Increase time)   General bed mobility comments: No assistance needed  Transfers Overall transfer level: Needs assistance Equipment used: Left platform walker Transfers: Sit to/from Stand Sit to Stand: Supervision         General transfer comment: good technique and good  safety  Ambulation/Gait Ambulation/Gait assistance: Supervision Gait Distance (Feet): 20 Feet Assistive device: Left platform walker Gait Pattern/deviations: Step-to pattern Gait velocity: decreased   General Gait Details: Pt able to perform short bout of gt training and good safety with turns and negotiating tight spaces.   Stairs             Wheelchair Mobility    Modified Rankin (Stroke Patients Only)       Balance Overall balance assessment: Needs assistance Sitting-balance support: Single extremity supported Sitting balance-Leahy Scale: Fair Sitting balance - Comments: Reliant on 1 UE support    Standing balance support: Bilateral upper extremity supported   Standing balance comment: Reliant on UE support in L platform RW, poor balance without                            Cognition Arousal/Alertness: Awake/alert Behavior During Therapy: WFL for tasks assessed/performed;Anxious Overall Cognitive Status: Within Functional Limits for tasks assessed                                        Exercises General Exercises - Lower Extremity Ankle Circles/Pumps: AROM;Right;Supine;20 reps(wiggle toes on L.) Quad Sets: AROM;Both;10 reps;Supine Long Arc Quad: Strengthening;AROM;10 reps Heel Slides: AROM;Strengthening;10 reps;Supine Hip ABduction/ADduction: Strengthening;AROM;10 reps;Supine Straight Leg Raises: AROM;Both;10 reps;Supine Hip Flexion/Marching: AROM;Both;10 reps;Supine Other Exercises Other Exercises: Medbride access code: [email or web address]    General Comments        Pertinent Vitals/Pain Pain Assessment: Faces Faces Pain Scale: Hurts little more Pain Location: L  LE in depedent position Pain Descriptors / Indicators: Hervey Ard;Heaviness;Tingling Pain Intervention(s): Monitored during session;Repositioned    Home Living                      Prior Function            PT Goals (current goals can now be found in the care  plan section) Acute Rehab PT Goals Patient Stated Goal: To go home Potential to Achieve Goals: Good Progress towards PT goals: Progressing toward goals    Frequency    Min 3X/week      PT Plan Discharge plan needs to be updated    Co-evaluation              AM-PAC PT "6 Clicks" Mobility   Outcome Measure  Help needed turning from your back to your side while in a flat bed without using bedrails?: None Help needed moving from lying on your back to sitting on the side of a flat bed without using bedrails?: None Help needed moving to and from a bed to a chair (including a wheelchair)?: None Help needed standing up from a chair using your arms (e.g., wheelchair or bedside chair)?: None Help needed to walk in hospital room?: A Little Help needed climbing 3-5 steps with a railing? : Total 6 Click Score: 20    End of Session Equipment Utilized During Treatment: Gait belt Activity Tolerance: Patient limited by fatigue Patient left: with call bell/phone within reach;in bed(LLE elevated, pt refused bed alarm RN aware) Nurse Communication: Mobility status(change in recommendations.) PT Visit Diagnosis: Difficulty in walking, not elsewhere classified (R26.2);Pain Pain - Right/Left: Left Pain - part of body: Ankle and joints of foot     Time: [id number] PT Time Calculation (min) (ACUTE ONLY): 36 min  Charges:  $Gait Training: 8-22 mins $Therapeutic Exercise: 8-22 mins                     Erasmo Leventhal , PTA Acute Rehabilitation Services Pager [phone] Office [phone]     Tryson Lumley Eli Hose 02/27/2020, 4:33 PM

## 2020-02-27 NOTE — Progress Notes (Signed)
Inpatient Rehab Admissions Coordinator Note:   PT reports pt. Made significant gains and is now recommending Home Health with plans to d/c tomorrow. CIR will sign off.   Clemens Catholic, [address], [place] Admissions Coordinator  [phone] ([place]) [phone] (office)

## 2020-02-27 NOTE — TOC Progression Note (Signed)
Transition of Care [facility]) - Progression Note    Patient Details  Name: Daniel Durham MRN: [id number] Date of Birth: 04-25-1966  Transition of Care [facility]) CM/SW Contact  Sharin Mons, RN Phone Number: [phone] 02/27/2020, 4:31 PM  Clinical Narrative:    Pt will transition to home with wife when medically stable, home health services to follow per PT's current recommendation. HH choice list  given. Referral made with [facility] for home health services and accepted  pending MD's orders.   DME:  L platform rolling walker will be delivered to bedside prior to d/c.  Pt states has transportation to home once d/c. Pt without  Rx  Medication concerns.  TOC team will continue to monitor for needs....   Expected Discharge Plan: [place] Barriers to Discharge: Continued Medical Work up  Expected Discharge Plan and Services Expected Discharge Plan: [place]   Discharge Planning Services: CM Consult   Living arrangements for the past 2 months: Single Family Home                 DME Arranged: Gilford Rile rolling(Plat form rolling walker) DME Agency: [employer] Date DME Agency Contacted: 02/27/20 Time DME Agency Contacted: [phone] Representative spoke with at DME Agency: [place]: PT [place]: [facility] (formerly [employer]) Date [place]: 02/27/20 Time [place]: [id number] Representative spoke with at [place]: [place] ([place]) Interventions    Readmission Risk Interventions No flowsheet data found.

## 2020-02-28 LAB — COMPREHENSIVE METABOLIC PANEL
ALT: 54 U/L — ABNORMAL HIGH (ref 0–44)
AST: 40 U/L (ref 15–41)
Albumin: 3.6 g/dL (ref 3.5–5.0)
Alkaline Phosphatase: 83 U/L (ref 38–126)
Anion gap: 10 (ref 5–15)
BUN: 14 mg/dL (ref 6–20)
CO2: 27 mmol/L (ref 22–32)
Calcium: 9.1 mg/dL (ref 8.9–10.3)
Chloride: 99 mmol/L (ref 98–111)
Creatinine, Ser: 1.04 mg/dL (ref 0.61–1.24)
GFR calc Af Amer: >60 mL/min (ref >60)
GFR calc non Af Amer: >60 mL/min (ref >60)
Glucose, Bld: 137 mg/dL — ABNORMAL HIGH (ref 70–99)
Potassium: 4.2 mmol/L (ref 3.5–5.1)
Sodium: 136 mmol/L (ref 135–145)
Total Bilirubin: 0.5 mg/dL (ref 0.3–1.2)
Total Protein: 7.1 g/dL (ref 6.5–8.1)

## 2020-02-28 LAB — CBC WITH DIFFERENTIAL/PLATELET
Abs Immature Granulocytes: 0.08 K/uL — ABNORMAL HIGH (ref 0.00–0.07)
Basophils Absolute: 0 K/uL (ref 0.0–0.1)
Basophils Relative: 1 %
Eosinophils Absolute: 0.4 K/uL (ref 0.0–0.5)
Eosinophils Relative: 4 %
HCT: 43.5 % (ref 39.0–52.0)
Hemoglobin: 14.6 g/dL (ref 13.0–17.0)
Immature Granulocytes: 1 %
Lymphocytes Relative: 30 %
Lymphs Abs: 2.5 K/uL (ref 0.7–4.0)
MCH: 33.5 pg (ref 26.0–34.0)
MCHC: 33.6 g/dL (ref 30.0–36.0)
MCV: 99.8 fL (ref 80.0–100.0)
Monocytes Absolute: 0.8 K/uL (ref 0.1–1.0)
Monocytes Relative: 10 %
Neutro Abs: 4.5 K/uL (ref 1.7–7.7)
Neutrophils Relative %: 54 %
Platelets: 270 K/uL (ref 150–400)
RBC: 4.36 MIL/uL (ref 4.22–5.81)
RDW: 11.9 % (ref 11.5–15.5)
WBC: 8.2 K/uL (ref 4.0–10.5)
nRBC: 0 % (ref 0.0–0.2)

## 2020-02-28 LAB — PHOSPHORUS: Phosphorus: 4 mg/dL (ref 2.5–4.6)

## 2020-02-28 LAB — MAGNESIUM: Magnesium: 2.2 mg/dL (ref 1.7–2.4)

## 2020-02-28 MED ORDER — POLYETHYLENE GLYCOL 3350 17 G PO PACK: 17.0000 g | PACK | Freq: Every day | ORAL | 0 refills | Status: DC

## 2020-02-28 MED ORDER — OXYCODONE HCL 5 MG PO TABS: 5.0000 mg | ORAL_TABLET | Freq: Four times a day (QID) | ORAL | 0 refills | Status: DC | PRN

## 2020-02-28 MED ORDER — ENOXAPARIN SODIUM 40 MG/0.4ML ~~LOC~~ SOLN: 40.0000 mg | SUBCUTANEOUS | 0 refills | Status: DC

## 2020-02-28 NOTE — Care Management Important Message (Signed)
Important Message  Patient Details  Name: Daniel Durham MRN: [id number] Date of Birth: 08/11/1966   Medicare Important Message Given:  Yes     [name] 02/28/2020, 3:16 PM

## 2020-02-28 NOTE — Discharge Summary (Signed)
Physician Discharge Summary  Daniel Durham [id number] DOB: Jan 28, 1966 DOA: 02/15/2020  PCP: Brunetta Jeans, PA-C  Admit date: 02/15/2020 Discharge date: 02/28/2020  Time spent: 40 minutes  Recommendations for Outpatient Follow-up:  1. Follow outpatient CBC/CMP 2.  follow with orthopedics outpatient 3. Continue lovenox for dvt ppx x 28 days 4. Follow with general surgery outpatient regarding possible elective gallbladder  Discharge Diagnoses:  Principal Problem:   Ankle fracture, bimalleolar, closed, left, initial encounter Active Problems:   HTN (hypertension)   Closed left ankle fracture   Discharge Condition: stable  Diet recommendation: heart healthy  Filed Weights   02/15/20 1654 02/23/20 0721  Weight: 113.4 kg 114 kg    History of present illness:  54 year old male with history of chronic back pain, asthma, EtOH use, LUE amputation and HTN presenting after mechanical fall with head impaction and brief LOC, and left ankle fracture as noted on x-ray.   In ED, left ankle x-ray revealed revealed trimalleolar fracture with tiny osseous fragments seen on the medial anterior talar dome. Patient had reduction in ED. Orthopedic surgery, Dr. Stann Mainland consulted, and initially recommended outpatient follow-up in [place] week for surgery when the swelling improves. However, patient with unstable fracture, significant pain and has LUE amputation to be discharged home safely. So he was admitted for adequate pain control and surgical intervention on 02/23/2020.  CT head, C-spine and L-spine without significant finding. COVID-19 PCR negative.  He was admitted for [place] left trimalleolar fracture.  Fx was reduced in the ED and he was admitted for pain control.  He's now s/p surgery with orthopedics.  Plan for d/c home with [facility].  Hospital Course:  Mechanical fall at home Left trimalleolar fracture Left wrist pain-x-ray without significant finding. - s/p reduction in the ED -Continue  current pain regimen for pain control with bowel regimen. -Continue leg elevation -s/p open treatment of L ankle fracture with internal fixation and open reduction internal fixation of L syndesmotic disruption and intraoperative stress radiography on 4/22 - appreciate ortho assistance -> will need dvt ppx at discharge - discussed with Dr. Stann Mainland, recommending 28 days lovenox (allergic to asa) - Pain management - therapy recommending home with home health   Acute Kidney Injury: improved, follow outpatient   Essential hypertension: Normotensive -resume home BP meds  Alcohol abuse -CIWA monitoring with Ativan -Continue vitamins -Cessation counseling.  Elevated liver enzymes: Mild, continue to monitor.  History of asthma: On DuoNeb at home. -Continue Singulair and as needed DuoNeb  Chronic pain syndrome: On Percocet, gabapentin, Zanaflex -Continue as needed oxycodone, gabapentin and Flexeril (IV morphine for breakthrough) -As needed MiraLAX and Senokot-S  RUQ pain: Seems to have resolved. CT abdomen without significant finding. Mild LFT resolved. -Continue monitoring -surgery rec f/u outpatient in clinic for elective outpatient gallbladder  Class II obesity:Body mass index is 39.16 kg/m. -Encourage lifestyle change to lose weight.  Procedures: Reduction in ED  s/p open treatment of L ankle fracture with internal fixation and open reduction internal fixation of L syndesmotic disruption and intraoperative stress radiography on 4/22   Consultations:  orthopedics  Discharge Exam: Vitals:   02/28/20 0345 02/28/20 0833  BP: (!) 156/79 (!) 145/82  Pulse: 90 85  Resp: 16 18  Temp: 98.4 F (36.9 C) 97.9 F (36.6 C)  SpO2: 95% 96%   Feels well, ready to go home  General: No acute distress. Cardiovascular: Heart sounds show Daniel Durham regular rate, and rhythm. Lungs: Clear to auscultation bilaterally Abdomen: Soft, nontender, nondistended  Neurological: Alert  and  oriented 3. Moves all extremities 4 . Cranial nerves II through XII grossly intact. Skin: Warm and dry. No rashes or lesions. Extremities: LLE in splint  Discharge Instructions   Discharge Instructions    Call MD for:  difficulty breathing, headache or visual disturbances   Complete by: As directed    Call MD for:  extreme fatigue   Complete by: As directed    Call MD for:  hives   Complete by: As directed    Call MD for:  persistant dizziness or light-headedness   Complete by: As directed    Call MD for:  persistant nausea and vomiting   Complete by: As directed    Call MD for:  redness, tenderness, or signs of infection (pain, swelling, redness, odor or green/yellow discharge around incision site)   Complete by: As directed    Call MD for:  severe uncontrolled pain   Complete by: As directed    Call MD for:  temperature >100.4   Complete by: As directed    Diet - low sodium heart healthy   Complete by: As directed    Discharge instructions   Complete by: As directed    You were seen for Daniel Durham left ankle fracture.  You were treated surgically by orthopedics.  Please follow up with surgery outpatient as scheduled.   You'll continue lovenox for 28 days after discharge for DVT prophylaxis.  I've discharged you with 5 mg oxycodone to use in addition to your 10 mg percocet at home for pain not improved with your home regimen.   Please follow up with general surgery outpatient.  They may discuss possibly getting your gallbladder removed.   Return for new, recurrent, or worsening symptoms.  Please ask your PCP to request records from this hospitalization so they know what was done and what the next steps will be.   Increase activity slowly   Complete by: As directed      Allergies as of 02/28/2020      Reactions   Aspirin Swelling   Hydrocodone Itching      Medication List    TAKE these medications   ALPRAZolam 0.25 MG tablet Commonly known as: XANAX Take 1 tablet  (0.25 mg total) by mouth 2 (two) times daily as needed for anxiety. What changed: when to take this   atorvastatin 10 MG tablet Commonly known as: LIPITOR TAKE 1 TABLET(10 MG) BY MOUTH DAILY What changed: See the new instructions.   cyclobenzaprine 10 MG tablet Commonly known as: FLEXERIL Take 1 tablet (10 mg total) by mouth at bedtime. What changed:   when to take this  reasons to take this   docusate sodium 100 MG capsule Commonly known as: COLACE Take 1 capsule (100 mg total) by mouth 2 (two) times daily.   enoxaparin 40 MG/0.4ML injection Commonly known as: LOVENOX Inject 0.4 mLs (40 mg total) into the skin daily for 28 days.   gabapentin 300 MG capsule Commonly known as: NEURONTIN Take 1 capsule each morning and midday along with your 600 mg dose to make [place] total of 900 mg What changed:   how much to take  how to take this  when to take this  additional instructions   gabapentin 600 MG tablet Commonly known as: NEURONTIN Take 1 tablet (600 mg total) by mouth 3 (three) times daily. What changed:   when to take this  additional instructions   ipratropium-albuterol 0.5-2.5 (3) MG/3ML Soln Commonly known as: DUONEB Take 3  mLs by nebulization 3 (three) times daily.   lisinopril 20 MG tablet Commonly known as: ZESTRIL TAKE 1 TABLET(20 MG) BY MOUTH DAILY What changed: See the new instructions.   montelukast 10 MG tablet Commonly known as: SINGULAIR Take 10 mg by mouth daily.   oxyCODONE 5 MG immediate release tablet Commonly known as: Roxicodone Take 1 tablet (5 mg total) by mouth every 6 (six) hours as needed for up to 5 days. (use in addition to your 10 mg tablets for increased pain as needed)   oxyCODONE-acetaminophen 10-325 MG tablet Commonly known as: PERCOCET TK 1 T PO Q 6 H PRN What changed:   how much to take  how to take this  when to take this  reasons to take this  additional instructions   polyethylene glycol 17 g  packet Commonly known as: MIRALAX / GLYCOLAX Take 17 g by mouth daily.   sertraline 50 MG tablet Commonly known as: Zoloft Take 1 tablet (50 mg total) by mouth daily.   tiZANidine 2 MG tablet Commonly known as: ZANAFLEX Take 2 mg by mouth daily as needed for muscle spasms.   zinc sulfate 220 (50 Zn) MG capsule Take 1 capsule (220 mg total) by mouth daily.            Durable Medical Equipment  (From admission, onward)         Start     Ordered   02/27/20 1619  For home use only DME Walker rolling  Once    Comments: L platform rolling walker  Question Answer Comment  Walker: With 5 Inch Wheels   Patient needs [place] walker to treat with the following condition Weakness      02/27/20 1620         Allergies  Allergen Reactions  . Aspirin Swelling  . Hydrocodone Itching   Follow-up Information    Surgery, [place]. Call in 1 day(s).   Specialty: General Surgery Why: Please call to schedule [place] follow up appointment to discuss elective surgery for your gallbladder if it continues to give you issues Contact information: [address] [place] [address] [phone]        Nicholes Stairs, MD In 2 weeks.   Specialty: Orthopedic Surgery Why: For suture removal, For wound re-check Contact information: [address] [place] [address] [phone]        Home, [facility] Follow up.   Specialty: [place] Why: home health services arranged Contact information: [place] [address] [phone]            The results of significant diagnostics from this hospitalization (including imaging, microbiology, ancillary and laboratory) are listed below for reference.    Significant Diagnostic Studies: DG Wrist Complete Right  Result Date: 02/20/2020 CLINICAL DATA:  Right wrist pain since Malkie Wille fall five days ago. EXAM: RIGHT WRIST - COMPLETE 3+ VIEW COMPARISON:  Radiographs dated  07/20/2019 FINDINGS: There is no fracture or dislocation. Slight arthritic changes of the first [facility] joint, stable. IMPRESSION: No acute abnormality. Electronically Signed   By: Lorriane Shire M.D.   On: 02/20/2020 12:54   DG Ankle Complete Left  Result Date: 02/23/2020 CLINICAL DATA:  Intraoperative fluoroscopy EXAM: DG C-ARM 1-60 MIN; LEFT ANKLE COMPLETE - 3+ VIEW CONTRAST:  None FLUOROSCOPY TIME:  Fluoroscopy Time:  Not reported Radiation Exposure Index (if provided by the fluoroscopic device): NA Number of Acquired Spot Images: 4 COMPARISON:  None. FINDINGS: Intraoperative fluoroscopic views of the left ankle are provided for review demonstrating ORIF of the distal fibula and tight rope repair of the distal tibiofibular syndesmosis. IMPRESSION: Intraoperative fluoroscopic views of the left ankle are provided for review demonstrating ORIF of the distal fibula and tight rope repair of the distal tibiofibular syndesmosis. Electronically Signed   By: Eddie Candle M.D.   On: 02/23/2020 12:11   CT Head Wo Contrast  Result Date: 02/15/2020 CLINICAL DATA:  Head trauma, slipped on mirror and fell backwards EXAM: CT HEAD WITHOUT CONTRAST TECHNIQUE: Contiguous axial images were obtained from the base of the skull through the vertex without intravenous contrast. COMPARISON:  None. FINDINGS: Brain: No evidence of acute territorial infarction, hemorrhage, hydrocephalus,extra-axial collection or mass lesion/mass effect. Normal gray-white differentiation. Ventricles are normal in size and contour. Vascular: No hyperdense vessel or unexpected calcification. Skull: The skull is intact. No fracture or focal lesion identified. Sinuses/Orbits: The visualized paranasal sinuses and mastoid air cells are clear. The orbits and globes intact. Other: None Cervical spine: Alignment: Physiologic Skull base and vertebrae: Visualized skull base is intact. No atlanto-occipital dissociation. The vertebral body heights are well  maintained. The patient is status post ACDF at C6-C7. There is solid interbody fusion seen across the disc space. No periprosthetic lucency or hardware fracture seen. No fracture or pathologic osseous lesion seen. Soft tissues and spinal canal: The visualized paraspinal soft tissues are unremarkable. No prevertebral soft tissue swelling is seen. The spinal canal is grossly unremarkable, no large epidural collection or significant canal narrowing. Disc levels: No significant canal or neural foraminal narrowing is noted. Upper chest: Mild biapical ground-glass opacity, likely atelectasis. Thoracic inlet is within normal limits. Other: None IMPRESSION: 1.  No acute intracranial abnormality. 2.  No acute fracture or malalignment of the spine. 3. Status post ACDF at C6-C7 without hardware complication. Electronically Signed   By: Prudencio Pair M.D.   On: 02/15/2020 19:29   CT Cervical Spine Wo Contrast  Result Date: 02/15/2020 CLINICAL DATA:  Head trauma, slipped on mirror and fell backwards EXAM: CT HEAD WITHOUT CONTRAST TECHNIQUE: Contiguous axial images were obtained from the base of the skull through the vertex without intravenous contrast. COMPARISON:  None. FINDINGS: Brain: No evidence of acute territorial infarction, hemorrhage, hydrocephalus,extra-axial collection or mass lesion/mass effect. Normal gray-white differentiation. Ventricles are normal in size and contour. Vascular: No hyperdense vessel or unexpected calcification. Skull: The skull is intact. No fracture or focal lesion identified. Sinuses/Orbits: The visualized paranasal sinuses and mastoid air cells are clear. The orbits and globes intact. Other: None Cervical spine: Alignment: Physiologic Skull base and vertebrae: Visualized skull base is intact. No atlanto-occipital dissociation. The vertebral body heights are well maintained. The patient is status post ACDF at C6-C7. There is solid interbody fusion seen across the disc space. No periprosthetic  lucency or hardware fracture seen. No fracture or pathologic osseous lesion seen. Soft tissues and spinal canal: The visualized paraspinal soft tissues are unremarkable. No prevertebral soft tissue swelling is seen. The spinal canal is grossly unremarkable, no large epidural collection or significant canal narrowing. Disc levels: No significant canal or neural foraminal narrowing is noted. Upper chest: Mild biapical ground-glass opacity, likely atelectasis. Thoracic inlet is within normal limits. Other: None IMPRESSION: 1.  No acute intracranial abnormality. 2.  No acute fracture or malalignment of the spine. 3. Status post ACDF at C6-C7 without hardware complication. Electronically Signed   By: Prudencio Pair M.D.   On: 02/15/2020 19:29  CT Lumbar Spine Wo Contrast  Result Date: 02/15/2020 CLINICAL DATA:  54 year old male status post slip and fall backwards. Low back pain. EXAM: CT LUMBAR SPINE WITHOUT CONTRAST TECHNIQUE: Multidetector CT imaging of the lumbar spine was performed without intravenous contrast administration. Multiplanar CT image reconstructions were also generated. COMPARISON:  Lumbar spine CT 07/30/2015. FINDINGS: Segmentation: Mildly transitional anatomy, same numbering system used as in 2016 designating the lowest open disc space L5-S1, but there are hypoplastic ribs at L1 (more so the right series 5, image 27). Correlation with radiographs is recommended prior to any operative intervention. Alignment: Stable straightening of lumbar lordosis. No spondylolisthesis. Vertebrae: T12 and lumbar vertebrae appear intact. Intact visible sacrum and SI joints. Paraspinal and other soft tissues: Partially visible fatty pancreatic atrophy. Otherwise negative visible noncontrast abdominal and pelvic viscera. Paraspinal soft tissues appear within normal limits, Koltan Portocarrero previously-seen thoracic spinal stimulator device has been removed. Disc levels: T11-T12: Moderate right side facet hypertrophy with moderate to  severe right T11 foraminal stenosis (series 5, image 1). T12-L1:  Negative. L1-L2:  Negative. L2-L3: Minimal disc bulge and mild facet hypertrophy appears stable since 2016. No convincing stenosis. L3-L4: Mild circumferential disc bulge and facet hypertrophy appears stable since 2016 with up to mild spinal and bilateral L3 foraminal stenosis. L4-L5: Mild circumferential disc bulge and facet hypertrophy. No convincing spinal stenosis. Mild L4 foraminal stenosis appears stable and in part related to chronic endplate spurring. L5-S1: Interval postoperative changes to the left lamina (series 4 image 103). Suspect architectural distortion at the left lateral recess. Mild facet hypertrophy. Residual disc bulge and endplate spurring. No convincing spinal stenosis. Mild bilateral L5 foraminal stenosis appears stable. IMPRESSION: 1. No acute traumatic injury identified in the lumbar spine. 2. Mildly transitional anatomy, same numbering system used on Kameshia Madruga 2016 lumbar CT. 3. Thoracic spinal stimulator device removed since 2016. And interval postoperative changes to the left lamina at L5-S1. 4. 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 And up to mild L3 through L5 foraminal stenosis appears stable. 5. Moderate to severe right T11 neural foraminal stenosis related to facet degeneration. Electronically Signed   By: Genevie Ann M.D.   On: 02/15/2020 19:36   CT ABDOMEN PELVIS W CONTRAST  Result Date: 02/16/2020 CLINICAL DATA:  Right upper quadrant abdominal pain for 10 days, fall yesterday EXAM: CT ABDOMEN AND PELVIS WITH CONTRAST TECHNIQUE: Multidetector CT imaging of the abdomen and pelvis was performed using the standard protocol following bolus administration of intravenous contrast. CONTRAST:  11mL OMNIPAQUE IOHEXOL 300 MG/ML  SOLN COMPARISON:  02/06/2020 FINDINGS: Lower chest: No acute abnormality. Hepatobiliary: No solid liver abnormality is seen. Hepatic steatosis. No gallstones, gallbladder wall thickening, or  biliary dilatation. Pancreas: Fatty atrophy of the pancreas. No pancreatic ductal dilatation or surrounding inflammatory changes. Spleen: Normal in size without significant abnormality. Adrenals/Urinary Tract: Adrenal glands are unremarkable. Kidneys are normal, without renal calculi, solid lesion, or hydronephrosis. Bladder is unremarkable. Stomach/Bowel: Stomach is within normal limits. Appendix appears normal. No evidence of bowel wall thickening, distention, or inflammatory changes. Vascular/Lymphatic: No significant vascular findings are present. No enlarged abdominal or pelvic lymph nodes. Reproductive: No mass or other significant abnormality. Other: Fat containing bilateral inguinal hernias. No abdominopelvic ascites. Musculoskeletal: No acute or significant osseous findings. IMPRESSION: 1. No acute CT findings of the abdomen or pelvis to explain right upper quadrant abdominal pain. 2.  No evidence of acute traumatic injury to the abdomen or pelvis. 3.  Hepatic steatosis. Electronically Signed   By: Eddie Candle  M.D.   On: 02/16/2020 16:16   CT ABDOMEN PELVIS W CONTRAST  Result Date: 02/06/2020 CLINICAL DATA:  Abdominal distension. Right upper quadrant abdominal pain x1 week. EXAM: CT ABDOMEN AND PELVIS WITH CONTRAST TECHNIQUE: Multidetector CT imaging of the abdomen and pelvis was performed using the standard protocol following bolus administration of intravenous contrast. CONTRAST:  148mL OMNIPAQUE IOHEXOL 300 MG/ML  SOLN COMPARISON:  None. FINDINGS: Lower chest: The lung bases are clear. The heart size is normal. Hepatobiliary: There is decreased hepatic attenuation suggestive of hepatic steatosis. Normal gallbladder.There is no biliary ductal dilation. Pancreas: Normal contours without ductal dilatation. No peripancreatic fluid collection. Spleen: Unremarkable. Adrenals/Urinary Tract: --Adrenal glands: Unremarkable. --Right kidney/ureter: No hydronephrosis or radiopaque kidney stones. --Left  kidney/ureter: No hydronephrosis or radiopaque kidney stones. --Urinary bladder: Unremarkable. Stomach/Bowel: --Stomach/Duodenum: The stomach is moderately distended. --Small bowel: Unremarkable. --Colon: Unremarkable. --Appendix: Normal. Vascular/Lymphatic: Normal course and caliber of the major abdominal vessels. --No retroperitoneal lymphadenopathy. --No mesenteric lymphadenopathy. --No pelvic or inguinal lymphadenopathy. Reproductive: Unremarkable Other: No ascites or free air. There are bilateral fat containing inguinal hernias. Musculoskeletal. No acute displaced fractures. IMPRESSION: 1. The stomach is moderately distended. 2. Hepatic steatosis. 3. Unremarkable appearance of the gallbladder by CT standards. 4. Normal appendix in the right lower quadrant. Electronically Signed   By: Constance Holster M.D.   On: 02/06/2020 22:16   CT Ankle Left Wo Contrast  Result Date: 02/15/2020 CLINICAL DATA:  Ankle fracture after fall, post reduction EXAM: CT OF THE LEFT ANKLE WITHOUT CONTRAST TECHNIQUE: Multidetector CT imaging of the left ankle was performed according to the standard protocol. Multiplanar CT image reconstructions were also generated. COMPARISON:  Radiograph same day FINDINGS: Bones/Joint/Cartilage There is tiny osseous chip fractures seen off the medial malleolus. There remains slight widening of the medial malleolus measuring up to 5 mm. Comminuted intra-articular mildly displaced fracture seen of the posterior malleolus. There tiny osseous fragment seen at the anterior tibiotalar joint. Auriana Scalia mildly displaced obliquely oriented fracture seen of the distal fibula at the level of the ankle mortise with small surrounding fracture fragments within this seen is most CIS. No other definite fractures are identified. Kortney Potvin small os navicular is present. Ligaments Suboptimally assessed by CT. Muscles and Tendons The muscles surrounding the ankle appear to be intact. The flexor and extensor tendons are intact. The  Achilles tendon is intact. Soft tissues Diffuse soft tissue swelling is seen around the ankle, predominantly around the medial malleolus. There is [place] small ankle joint effusion present. Heel pad inflammation is present. IMPRESSION: 1. The patient is status post reduction of the trimalleolar fracture as described above. There remains slight widening of the medial clear space however. 2. Tiny osseous fragment seen at the medial anterior talar dome which could represent Daniel [place] small chip fracture. 3. Small ankle joint effusion and significant surrounding soft tissue edema. Electronically Signed   By: Prudencio Pair M.D.   On: 02/15/2020 19:36   DG Chest Portable 1 View  Result Date: 02/06/2020 CLINICAL DATA:  Right upper quadrant pain EXAM: PORTABLE CHEST 1 VIEW COMPARISON:  11/17/2019 FINDINGS: Cardiomegaly. No confluent opacities or effusions. No acute bony abnormality. IMPRESSION: Cardiomegaly.  No active disease. Electronically Signed   By: Rolm Baptise M.D.   On: 02/06/2020 21:16   DG Ankle Left Port  Result Date: 02/15/2020 CLINICAL DATA:  Post reduction EXAM: PORTABLE LEFT ANKLE - 2 VIEW COMPARISON:  Radiograph same day FINDINGS: There is been interval reduction of the fracture dislocation of the ankle. The ankle  mortise appears to be in near anatomic alignment. There remains [place] obliquely oriented mildly displaced distal fibular fracture. IMPRESSION: Status post reduction of ankle fracture dislocation in near anatomic alignment. Electronically Signed   By: Prudencio Pair M.D.   On: 02/15/2020 18:23   DG Ankle Left Port  Result Date: 02/15/2020 CLINICAL DATA:  Recent fall with ankle deformity, initial encounter EXAM: PORTABLE LEFT ANKLE - 2 VIEW COMPARISON:  None. FINDINGS: There is posterolateral dislocation of the talus with respect to the distal tibia. Oblique fracture through the distal fibula is noted. Some regularity in the region of the posterior malleolus is seen likely related to undisplaced fracture.  IMPRESSION: Distal fibular fracture with posterolateral dislocation of the talus with respect to the distal tibia. There is likely Jaxin Fulfer posterior malleolar fracture present as well. Electronically Signed   By: Inez Catalina M.D.   On: 02/15/2020 17:28   DG C-Arm 1-60 Min  Result Date: 02/23/2020 CLINICAL DATA:  Intraoperative fluoroscopy EXAM: DG C-ARM 1-60 MIN; LEFT ANKLE COMPLETE - 3+ VIEW CONTRAST:  None FLUOROSCOPY TIME:  Fluoroscopy Time:  Not reported Radiation Exposure Index (if provided by the fluoroscopic device): NA Number of Acquired Spot Images: 4 COMPARISON:  None. FINDINGS: Intraoperative fluoroscopic views of the left ankle are provided for review demonstrating ORIF of the distal fibula and tight rope repair of the distal tibiofibular syndesmosis. IMPRESSION: Intraoperative fluoroscopic views of the left ankle are provided for review demonstrating ORIF of the distal fibula and tight rope repair of the distal tibiofibular syndesmosis. Electronically Signed   By: Eddie Candle M.D.   On: 02/23/2020 12:11   US Abdomen Limited RUQ  Result Date: 02/06/2020 CLINICAL DATA:  54 year old male with right upper quadrant abdominal pain for 5 days, increasing today. EXAM: ULTRASOUND ABDOMEN LIMITED RIGHT UPPER QUADRANT COMPARISON:  CTA chest 11/17/2019. FINDINGS: Gallbladder: The gallbladder is not distended, and wall thickness remains normal at 2 mm. However, there does appear to be Daniel Durham small volume of dependent sludge, and several tiny calculi are suspected on the order of 2 mm. No pericholecystic fluid. The technologist reports Shandell Giovanni positive sonographic Murphy sign. Common bile duct: Diameter: 4 mm, normal. Liver: Echogenic liver (image 27). No discrete liver lesion. No intrahepatic biliary ductal dilatation is evident. Portal vein is patent on color Doppler imaging with normal direction of blood flow towards the liver. Other: Negative visible right kidney. IMPRESSION: 1. Suspected tiny gallstones but no strong  evidence of acute cholecystitis despite Raykwon Hobbs reported sonographic Murphy sign. 2. No evidence of acute bile duct obstruction. 3. Hepatic steatosis. Electronically Signed   By: Genevie Ann M.D.   On: 02/06/2020 21:41    Microbiology: Recent Results (from the past 240 hour(s))  Surgical pcr screen     Status: Abnormal   Collection Time: 02/23/20  5:59 AM   Specimen: Nasal Mucosa; Nasal Swab  Result Value Ref Range Status   MRSA, PCR NEGATIVE NEGATIVE Final   Staphylococcus aureus POSITIVE (Taj Nevins) NEGATIVE Final    Comment: (NOTE) The Xpert SA Assay (FDA approved for NASAL specimens in patients 12 years of age and older), is one component of Tiersa Dayley comprehensive surveillance program. It is not intended to diagnose infection nor to guide or monitor treatment. Performed at [facility], [place] [address] [place], [place] [address]      Labs: Basic Metabolic Panel: Recent Labs  Lab 02/24/20 [phone] 02/24/20 [id number] 02/24/20 1417 02/25/20 0421 02/26/20 0705 02/27/20 0358 02/28/20 0404  NA 136  --   --  138 139 139 136  K 5.3*   < > 4.7 4.2 3.9 4.3 4.2  CL 105  --   --  107 100 100 99  CO2 23  --   --  24 29 30 27   GLUCOSE 255*  --   --  173* 131* 144* 137*  BUN 20  --   --  17 15 15 14   CREATININE 1.08  --   --  0.90 0.96 1.11 1.04  CALCIUM 8.9  --   --  8.8* 8.9 9.2 9.1  MG 2.3  --   --  2.2 2.2 2.3 2.2  PHOS 1.1*  --   --  2.5 3.7 4.9* 4.0   < > = values in this interval not displayed.   Liver Function Tests: Recent Labs  Lab 02/24/20 0437 02/25/20 0421 02/26/20 0705 02/27/20 0358 02/28/20 0404  AST 43* 30 30 37 40  ALT 60* 47* 46* 51* 54*  ALKPHOS 76 66 78 85 83  BILITOT 0.6 0.3 0.3 0.4 0.5  PROT 6.5 6.5 6.7 7.0 7.1  ALBUMIN 3.4* 3.3* 3.5 3.6 3.6   No results for input(s): LIPASE, AMYLASE in the last 168 hours. No results for input(s): AMMONIA in the last 168 hours. CBC: Recent Labs  Lab 02/24/20 0437 02/25/20 0421 02/26/20 0705 02/27/20 0358 02/28/20 0404  WBC 14.6*  10.5 8.5 8.3 8.2  NEUTROABS 12.8* 6.4 3.9 4.2 4.5  HGB 13.7 12.8* 14.0 14.5 14.6  HCT 40.9 39.2 43.1 43.2 43.5  MCV 102.5* 102.9* 103.9* 101.6* 99.8  PLT 214 196 239 259 270   Cardiac Enzymes: No results for input(s): CKTOTAL, CKMB, CKMBINDEX, TROPONINI in the last 168 hours. BNP: BNP (last 3 results) Recent Labs    11/17/19 1750  BNP 16.2    ProBNP (last 3 results) No results for input(s): PROBNP in the last 8760 hours.  CBG: No results for input(s): GLUCAP in the last 168 hours.     Signed:  Fayrene Helper MD.  [facility] 02/28/2020, 7:31 PM

## 2020-02-28 NOTE — TOC Transition Note (Signed)
Transition of Care [facility]) - CM/SW Discharge Note   Patient Details  Name: Daniel Durham MRN: [id number] Date of Birth: Nov 05, 1965  [facility]) CM/SW Contact:  Sharin Mons, RN Phone Number:[phone] 02/28/2020, 12:42 PM   Clinical Narrative:    Pt will transition to home today with wife. [facility] to provide [facility] services. SOC within 48 hrs. DME: platform walker @ bedside to go home with. No affordable issues or concerns with Rx meds.  Wife to provide transportation to home.   Final next level of care: [place] Barriers to Discharge: No Barriers Identified   Patient Goals and CMS Choice     Choice offered to / list presented to : Patient  Discharge Placement     Discharge Plan and Services   Discharge Planning Services: CM Consult            DME Arranged: Gilford Rile rolling(Plat form rolling walker) DME Agency: [employer] Date DME Agency Contacted: 02/27/20 Time DME Agency Contacted: [phone] Representative spoke with at DME Agency: [place]: PT Branch: [facility] (formerly [employer]) Date [place]: 02/27/20 Time [place]: [place] Representative spoke with at [place]: [place] ([place]) Interventions     Readmission Risk Interventions No flowsheet data found.

## 2020-02-28 NOTE — Progress Notes (Signed)
Patient discharging home. Discharge instructions explained to patient and patients wife including administering Lovenox injection with return demonstration. Took all personal belongings. No further questions or concerns voiced.

## 2020-02-28 NOTE — Progress Notes (Signed)
Physical Therapy Treatment Patient Details Name: Daniel Durham MRN: [id number] DOB: October 03, 1966 Today's Date: 02/28/2020    History of Present Illness Pt is a 54 y/o male admitted 02/15/20 after fall sustaining L trimalleolar fx, pt reportedly slip and hit his head with brief LOC. S/p L ankle reduction and splinting in ED. Plan for potential sx 4/22 for fixation. PMH includes alcohol use, COVID, HTN, traumatic L forearm amputation.    PT Comments    Pt seated edge of bed on arrival.  PTA reviewed use of PFRW and tightened platform for improved fit and safety at home.  Pt eager to d/c.  Continue to recommend HHPT for home safety eval.  No order for d/c yet but patient hopeful to d/c home today and he is ready for d/c from a mobility stand point.     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  Transfers Overall transfer level: Modified independent Equipment used: Left platform walker Transfers: Sit to/from Stand Sit to Stand: Modified independent (Device/Increase time)            Ambulation/Gait Ambulation/Gait assistance: Supervision Gait Distance (Feet): 25 Feet Assistive device: Left platform walker Gait Pattern/deviations: Step-to pattern     General Gait Details: Pt able to perform short bout of gt training and good safety with turns and negotiating tight spaces.  Post gt session performed additional trial of 5 ft after tightening platform for  improved fit/use.   Stairs             Wheelchair Mobility    Modified Rankin (Stroke Patients Only)       Balance Overall balance assessment: Needs assistance Sitting-balance support: Single extremity supported Sitting balance-Leahy Scale: Fair       Standing balance-Leahy Scale: Fair Standing balance comment: Reliant on UE support in L platform RW, poor balance without                            Cognition Arousal/Alertness: Awake/alert Behavior During Therapy: WFL for tasks assessed/performed;Anxious Overall Cognitive Status: Within Functional Limits for tasks assessed                                        Exercises      General Comments        Pertinent Vitals/Pain Pain Assessment: Faces Faces Pain Scale: Hurts little more Pain Location: L LE in depedent position Pain Descriptors / Indicators: Sharp;Heaviness;Tingling Pain Intervention(s): Monitored during session;Repositioned    Home Living                      Prior Function            PT Goals (current goals can now be found in the care plan section) Acute Rehab PT Goals Patient Stated Goal: To go home Potential to  Achieve Goals: Good Progress towards PT goals: Progressing toward goals    Frequency    Min 3X/week      PT Plan Current plan remains appropriate    Co-evaluation              AM-PAC PT "6 Clicks" Mobility   Outcome Measure  Help needed turning from your back to your side while in a flat bed without using bedrails?: None Help needed moving from lying on your back to sitting on the side of a flat bed without using bedrails?: None Help needed moving to and from a bed to a chair (including a wheelchair)?: None Help needed standing up from a chair using your arms (e.g., wheelchair or bedside chair)?: None Help needed to walk in hospital room?: A Little Help needed climbing 3-5 steps with a railing? : A Little 6 Click Score:  22    End of Session   Activity Tolerance: Patient tolerated treatment well Patient left: in bed;with call bell/phone within reach(refused bed alarm) Nurse Communication: Mobility status(informed RN he is ready for d/c home.) PT Visit Diagnosis: Difficulty in walking, not elsewhere classified (R26.2);Pain Pain - Right/Left: Left Pain - part of body: Ankle and joints of foot     Time: 1106-1120 PT Time Calculation (min) (ACUTE ONLY): 14 min  Charges:  $Gait Training: 8-22 mins                     Erasmo Leventhal , PTA Acute Rehabilitation Services Pager [phone] Office [phone]     Inna Tisdell Eli Hose 02/28/2020, 11:25 AM

## 2020-03-01 DIAGNOSIS — M47816 Spondylosis without myelopathy or radiculopathy, lumbar region: Secondary | ICD-10-CM | POA: Diagnosis not present

## 2020-03-01 DIAGNOSIS — M5010 Cervical disc disorder with radiculopathy, unspecified cervical region: Secondary | ICD-10-CM | POA: Diagnosis not present

## 2020-03-01 DIAGNOSIS — I10 Essential (primary) hypertension: Secondary | ICD-10-CM | POA: Diagnosis not present

## 2020-03-01 DIAGNOSIS — S82852D Displaced trimalleolar fracture of left lower leg, subsequent encounter for closed fracture with routine healing: Secondary | ICD-10-CM | POA: Diagnosis not present

## 2020-03-01 DIAGNOSIS — J45909 Unspecified asthma, uncomplicated: Secondary | ICD-10-CM | POA: Diagnosis not present

## 2020-03-01 DIAGNOSIS — F329 Major depressive disorder, single episode, unspecified: Secondary | ICD-10-CM | POA: Diagnosis not present

## 2020-03-01 DIAGNOSIS — G894 Chronic pain syndrome: Secondary | ICD-10-CM | POA: Diagnosis not present

## 2020-03-01 DIAGNOSIS — F419 Anxiety disorder, unspecified: Secondary | ICD-10-CM | POA: Diagnosis not present

## 2020-03-01 DIAGNOSIS — F1010 Alcohol abuse, uncomplicated: Secondary | ICD-10-CM | POA: Diagnosis not present

## 2020-03-02 ENCOUNTER — Telehealth (INDEPENDENT_AMBULATORY_CARE_PROVIDER_SITE_OTHER): Payer: Medicare HMO | Admitting: Physician Assistant

## 2020-03-02 ENCOUNTER — Other Ambulatory Visit: Payer: Self-pay

## 2020-03-02 ENCOUNTER — Encounter: Payer: Self-pay | Admitting: Physician Assistant

## 2020-03-02 DIAGNOSIS — R7989 Other specified abnormal findings of blood chemistry: Secondary | ICD-10-CM | POA: Diagnosis not present

## 2020-03-02 DIAGNOSIS — G894 Chronic pain syndrome: Secondary | ICD-10-CM

## 2020-03-02 DIAGNOSIS — S82842D Displaced bimalleolar fracture of left lower leg, subsequent encounter for closed fracture with routine healing: Secondary | ICD-10-CM

## 2020-03-02 MED ORDER — OXYCODONE HCL 5 MG PO TABS: 5.0000 mg | ORAL_TABLET | ORAL | 0 refills | Status: DC | PRN

## 2020-03-02 MED ORDER — LISINOPRIL 20 MG PO TABS: ORAL_TABLET | ORAL | 1 refills | Status: DC

## 2020-03-02 MED ORDER — RIVAROXABAN 10 MG PO TABS
10.0000 mg | ORAL_TABLET | Freq: Every day | ORAL | 0 refills | Status: DC
Start: 2020-03-02 — End: 2020-03-09

## 2020-03-02 NOTE — Progress Notes (Signed)
I have discussed the procedure for the virtual visit with the patient who has given consent to proceed with assessment and treatment.   Jemaine Prokop S Yasmine Kilbourne, CMA     

## 2020-03-02 NOTE — Progress Notes (Signed)
Virtual Visit via Video   I connected with patient on 03/02/20 at  8:00 AM EDT by a video enabled telemedicine application and verified that I am speaking with the correct person using two identifiers.  Location patient: Home Location provider: [facility], Office Persons participating in the virtual visit: Patient, Provider, [place] (Patina Moore)  I discussed the limitations of evaluation and management by telemedicine and the availability of in person appointments. The patient expressed understanding and agreed to proceed.  Subjective:   HPI:   Patient presents via [place] today for hospital follow-up.  Patient presented to the ER on 02/15/2020 after having a fall at home with loss of consciousness and significant left ankle pain.  ER work-up included x-ray of left ankle/foot revealing bimalleolar fracture.  Given pain extremities high fall risk, patient was subsequently admitted to the hospital for further management and monitoring.  Patient underwent surgical treatment on 422 with open reduction and internal fixation of left syndesmotic disruption and fracture.  Patient also was placed on CIWA protocol due to history of alcohol use.  Patient did well throughout hospitalization.  Pain adequately controlled during hospitalization as well.  Patient recommended to continue DVT prophylaxis at discharge and was started on Lovenox to complete a 28-day course given his allergy to aspirin.  Was discharged home on 02/28/2020 with home health physical therapy.  Since discharge patient endorses having significant breakthrough pain.  Notes his pain level rises to a 10 out of 10 with movement.  This is despite his gabapentin and Percocet.  Patient is out of short acting oxycodone that he was given on discharge from the hospital.  Will remain nonweightbearing for 5 weeks.  Has noted his appetite is significantly decreased secondary to pain.  Does have scheduled follow-up with orthopedic surgeon on  03/08/2020.  Was told at that time that we will likely remove his soft cast and placed him in a hard cast and will consider starting weightbearing exercises.  In regards to DVT prophylaxis, patient states he was unable to afford the Lovenox.  As such has not been taking anything since being home.  Notes home health physical therapy through [facility] has already started.  States physical therapist notes he is doing well thus far.  ROS:   See pertinent positives and negatives per HPI.  Patient Active Problem List   Diagnosis Date Noted  . Closed left ankle fracture 02/16/2020  . Ankle fracture, bimalleolar, closed, left, initial encounter 02/15/2020  . Morbid obesity ([place]) 12/27/2019  . HTN (hypertension) 11/18/2019  . Acute respiratory failure with hypoxia ([place]) 11/17/2019  . Colon cancer screening 05/25/2018  . Anxiety and depression 03/03/2017  . Cervical disc disorder with radiculopathy of cervical region 07/24/2015  . 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 Visit for preventive health examination 02/04/2015  . Chronic back pain greater than 3 months duration 01/23/2015  . 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 Amputation stump complication ([place]) [id number]    Social History   Tobacco Use  . Smoking status: Never Smoker  . Smokeless tobacco: Never Used  Substance Use Topics  . Alcohol use: Yes    Alcohol/week: 0.0 standard drinks    Comment: occ    Current Outpatient Medications:  .  ALPRAZolam (XANAX) 0.25 MG tablet, Take 1 tablet (0.25 mg total) by mouth 2 (two) times daily as needed for anxiety. (Patient taking differently: Take 0.25 mg by  mouth daily as needed for anxiety. ), Disp: 30 tablet, Rfl: 0 .  atorvastatin (LIPITOR) 10 MG tablet, TAKE 1 TABLET(10 MG) BY MOUTH DAILY (Patient taking differently: Take 10 mg by mouth daily. ), Disp: 90 tablet, Rfl: 1 .   cyclobenzaprine (FLEXERIL) 10 MG tablet, Take 1 tablet (10 mg total) by mouth at bedtime. (Patient taking differently: Take 10 mg by mouth daily as needed for muscle spasms. ), Disp: 30 tablet, Rfl: 0 .  docusate sodium (COLACE) 100 MG capsule, Take 1 capsule (100 mg total) by mouth 2 (two) times daily. (Patient not taking: Reported on 02/15/2020), Disp: 60 capsule, Rfl: 11 .  enoxaparin (LOVENOX) 40 MG/0.4ML injection, Inject 0.4 mLs (40 mg total) into the skin daily for 28 days., Disp: 11.2 mL, Rfl: 0 .  gabapentin (NEURONTIN) 300 MG capsule, Take 1 capsule each morning and midday along with your 600 mg dose to make a total of 900 mg (Patient taking differently: Take 900 mg by mouth in the morning and at bedtime. ), Disp: 60 capsule, Rfl: 3 .  gabapentin (NEURONTIN) 600 MG tablet, Take 1 tablet (600 mg total) by mouth 3 (three) times daily. (Patient taking differently: Take 600 mg by mouth daily. In the afternoon.), Disp: 270 tablet, Rfl: 1 .  ipratropium-albuterol (DUONEB) 0.5-2.5 (3) MG/3ML SOLN, Take 3 mLs by nebulization 3 (three) times daily., Disp: 360 mL, Rfl: 0 .  lisinopril (ZESTRIL) 20 MG tablet, TAKE 1 TABLET(20 MG) BY MOUTH DAILY (Patient taking differently: Take 20 mg by mouth daily. ), Disp: 90 tablet, Rfl: 1 .  montelukast (SINGULAIR) 10 MG tablet, Take 10 mg by mouth daily. , Disp: , Rfl:  .  oxyCODONE (ROXICODONE) 5 MG immediate release tablet, Take 1 tablet (5 mg total) by mouth every 6 (six) hours as needed for up to 5 days. (use in addition to your 10 mg tablets for increased pain as needed), Disp: 20 tablet, Rfl: 0 .  oxyCODONE-acetaminophen (PERCOCET) 10-325 MG tablet, TK 1 T PO Q 6 H PRN (Patient taking differently: Take 1 tablet by mouth every 6 (six) hours as needed. ), Disp: 120 tablet, Rfl: 0 .  polyethylene glycol (MIRALAX / GLYCOLAX) 17 g packet, Take 17 g by mouth daily., Disp: 14 each, Rfl: 0 .  sertraline (ZOLOFT) 50 MG tablet, Take 1 tablet (50 mg total) by mouth daily.,  Disp: 90 tablet, Rfl: 1 .  tiZANidine (ZANAFLEX) 2 MG tablet, Take 2 mg by mouth daily as needed for muscle spasms. , Disp: , Rfl: 1  Allergies  Allergen Reactions  . Aspirin Swelling  . Hydrocodone Itching    Objective:   There were no vitals taken for this visit.  Patient is well-developed, well-nourished in no acute distress.  Resting on his bed. Head is normocephalic, atraumatic.  No labored breathing.  Speech is clear and coherent with logical content.  Patient is alert and oriented at baseline.   Assessment and Plan:   1. Closed bimalleolar fracture of left ankle with routine healing, subsequent encounter Orthopedic follow-up and home health physical therapy sessions in place.  Patient to remain nonweightbearing for a total of 5 weeks.  Encouraged him to continue care as directed by the orthopedist.  Given severity of pain do feel it is reasonable to continue the short acting oxycodone in addition to his chronic pain management regimen.  We will give 2 weeks worth of medication and reassess at that time.  Patient unable to afford Lovenox for DVT prophylaxis.  Is  allergic to aspirin.  Discussed Xarelto 10 mg as recommended prophylactic dose.  Checked with pharmacy and this will be $45 for 30-day supply.  Patient states he cannot afford this.  Do have samples of Xarelto 15 mg once daily that we can give him.  Discussed risk versus benefit of slight increased dose.  Patient voiced understanding and agreement.  Sample was will be provided for his wife to pick up, to take once daily.  2. Chronic pain syndrome Continue current medications, adding on short acting oxycodone 5 mg as directed to help with breakthrough pain.  Once he is back to his baseline after surgery, we will resend referral to pain management as his appointment had to be canceled due to hospitalization.  3. Elevated LFTs We will try to send home health RN out to update labs getting repeat CBC and CMP.  If unable to do  so, we will have him come into the office for lab draw.  Notes he has not had any alcohol since coming home.  Discussed with medications he is on it is important that he did not restart alcohol.  We will monitor this.    Leeanne Rio, PA-C 03/02/2020

## 2020-03-05 DIAGNOSIS — M47816 Spondylosis without myelopathy or radiculopathy, lumbar region: Secondary | ICD-10-CM | POA: Diagnosis not present

## 2020-03-05 DIAGNOSIS — S82852D Displaced trimalleolar fracture of left lower leg, subsequent encounter for closed fracture with routine healing: Secondary | ICD-10-CM | POA: Diagnosis not present

## 2020-03-05 DIAGNOSIS — G894 Chronic pain syndrome: Secondary | ICD-10-CM | POA: Diagnosis not present

## 2020-03-05 DIAGNOSIS — F329 Major depressive disorder, single episode, unspecified: Secondary | ICD-10-CM | POA: Diagnosis not present

## 2020-03-05 DIAGNOSIS — M5010 Cervical disc disorder with radiculopathy, unspecified cervical region: Secondary | ICD-10-CM | POA: Diagnosis not present

## 2020-03-05 DIAGNOSIS — I10 Essential (primary) hypertension: Secondary | ICD-10-CM | POA: Diagnosis not present

## 2020-03-05 DIAGNOSIS — J45909 Unspecified asthma, uncomplicated: Secondary | ICD-10-CM | POA: Diagnosis not present

## 2020-03-05 DIAGNOSIS — F419 Anxiety disorder, unspecified: Secondary | ICD-10-CM | POA: Diagnosis not present

## 2020-03-05 DIAGNOSIS — F1010 Alcohol abuse, uncomplicated: Secondary | ICD-10-CM | POA: Diagnosis not present

## 2020-03-06 ENCOUNTER — Encounter: Payer: Self-pay | Admitting: Physician Assistant

## 2020-03-06 DIAGNOSIS — J45909 Unspecified asthma, uncomplicated: Secondary | ICD-10-CM | POA: Diagnosis not present

## 2020-03-06 DIAGNOSIS — G894 Chronic pain syndrome: Secondary | ICD-10-CM | POA: Diagnosis not present

## 2020-03-06 DIAGNOSIS — M5010 Cervical disc disorder with radiculopathy, unspecified cervical region: Secondary | ICD-10-CM | POA: Diagnosis not present

## 2020-03-06 DIAGNOSIS — F1010 Alcohol abuse, uncomplicated: Secondary | ICD-10-CM | POA: Diagnosis not present

## 2020-03-06 DIAGNOSIS — F329 Major depressive disorder, single episode, unspecified: Secondary | ICD-10-CM | POA: Diagnosis not present

## 2020-03-06 DIAGNOSIS — I10 Essential (primary) hypertension: Secondary | ICD-10-CM | POA: Diagnosis not present

## 2020-03-06 DIAGNOSIS — S82852D Displaced trimalleolar fracture of left lower leg, subsequent encounter for closed fracture with routine healing: Secondary | ICD-10-CM | POA: Diagnosis not present

## 2020-03-06 DIAGNOSIS — F419 Anxiety disorder, unspecified: Secondary | ICD-10-CM | POA: Diagnosis not present

## 2020-03-06 DIAGNOSIS — M47816 Spondylosis without myelopathy or radiculopathy, lumbar region: Secondary | ICD-10-CM | POA: Diagnosis not present

## 2020-03-08 ENCOUNTER — Other Ambulatory Visit: Payer: Self-pay | Admitting: Physician Assistant

## 2020-03-08 DIAGNOSIS — M5010 Cervical disc disorder with radiculopathy, unspecified cervical region: Secondary | ICD-10-CM

## 2020-03-08 DIAGNOSIS — G8929 Other chronic pain: Secondary | ICD-10-CM

## 2020-03-08 DIAGNOSIS — S8263XA Displaced fracture of lateral malleolus of unspecified fibula, initial encounter for closed fracture: Secondary | ICD-10-CM | POA: Insufficient documentation

## 2020-03-08 DIAGNOSIS — M549 Dorsalgia, unspecified: Secondary | ICD-10-CM

## 2020-03-08 DIAGNOSIS — F419 Anxiety disorder, unspecified: Secondary | ICD-10-CM

## 2020-03-08 DIAGNOSIS — F32A Depression, unspecified: Secondary | ICD-10-CM

## 2020-03-08 DIAGNOSIS — S8262XA Displaced fracture of lateral malleolus of left fibula, initial encounter for closed fracture: Secondary | ICD-10-CM | POA: Diagnosis not present

## 2020-03-08 MED ORDER — OXYCODONE-ACETAMINOPHEN 10-325 MG PO TABS: ORAL_TABLET | ORAL | 0 refills | Status: DC

## 2020-03-08 MED ORDER — ALPRAZOLAM 0.25 MG PO TABS: 0.2500 mg | ORAL_TABLET | Freq: Two times a day (BID) | ORAL | 0 refills | Status: DC | PRN

## 2020-03-08 NOTE — Telephone Encounter (Signed)
Alprazolam #30 , 0 refills, last filled 02/09/20 Oxycodone #120, 0 refills, last filled 02/09/20 Last office visit 03/02/20 No future visit scheduled

## 2020-03-09 ENCOUNTER — Telehealth (INDEPENDENT_AMBULATORY_CARE_PROVIDER_SITE_OTHER): Payer: Medicare HMO | Admitting: Physician Assistant

## 2020-03-09 ENCOUNTER — Other Ambulatory Visit: Payer: Self-pay

## 2020-03-09 ENCOUNTER — Encounter: Payer: Self-pay | Admitting: Physician Assistant

## 2020-03-09 DIAGNOSIS — Z299 Encounter for prophylactic measures, unspecified: Secondary | ICD-10-CM | POA: Diagnosis not present

## 2020-03-09 DIAGNOSIS — S82842A Displaced bimalleolar fracture of left lower leg, initial encounter for closed fracture: Secondary | ICD-10-CM | POA: Diagnosis not present

## 2020-03-09 MED ORDER — RIVAROXABAN 15 MG PO TABS
15.0000 mg | ORAL_TABLET | Freq: Every day | ORAL | 0 refills | Status: DC
Start: 2020-03-09 — End: 2020-04-23

## 2020-03-09 MED ORDER — OXYCODONE HCL 5 MG PO TABS: 5.0000 mg | ORAL_TABLET | ORAL | 0 refills | Status: AC | PRN

## 2020-03-09 NOTE — Progress Notes (Signed)
I have discussed the procedure for the virtual visit with the patient who has given consent to proceed with assessment and treatment. Patient unable to obtain vital signs.  Fritz Pickerel, LPN

## 2020-03-09 NOTE — Patient Instructions (Signed)
Instructions sent to MyChart

## 2020-03-09 NOTE — Progress Notes (Signed)
Virtual Visit via Video   I connected with patient on 03/09/20 at  8:00 AM EDT by a video enabled telemedicine application and verified that I am speaking with the correct person using two identifiers.  Location patient: Home Location provider: [facility], Office Persons participating in the virtual visit: Patient, Provider, [place] (Patina Moore)  I discussed the limitations of evaluation and management by telemedicine and the availability of in person appointments. The patient expressed understanding and agreed to proceed.  Subjective:   HPI:   Patient presents via [place] today to discuss ongoing DVT prophylaxis.  Patient had follow-up yesterday with orthopedic surgery in regards to his bimalleolar fracture of left ankle status post ORIF.  Notes soft cast was removed, staples removed, area was cleaned and examined and a hard cast was applied.  He is to wear this for the next 4 weeks before his next follow-up, at which time they plan to place him in a boot and start weightbearing physical therapy.  Currently has home health physical therapy 3 times weekly.  Is doing well with this.  Is still noting a quite severe level of pain.  States orthopedic told him that it is likely from them having to cut some nerves.  They did recommend that he continue his current regimen.  Patient stated he discussed with his orthopedic surgeon that he would prefer to get medicine from one provider and orthopedic surgery said this would be okay if PCP in agreement.  Is also recommended to continue 3 more weeks (a total of 4 weeks) of the Xarelto.  They were unable to provide him with samples and patient would like to see if we have more available to him.  Was unable to afford the Lovenox initially prescribed on hospital discharge.  Was also unable to afford Xarelto as a prescription.  ROS:   See pertinent positives and negatives per HPI.  Patient Active Problem List   Diagnosis Date Noted  . Closed  left ankle fracture 02/16/2020  . Ankle fracture, bimalleolar, closed, left, initial encounter 02/15/2020  . Morbid obesity ([place]) 12/27/2019  . HTN (hypertension) 11/18/2019  . Colon cancer screening 05/25/2018  . Anxiety and depression 03/03/2017  . Cervical disc disorder with radiculopathy of cervical region 07/24/2015  . 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 Visit for preventive health examination 02/04/2015  . Chronic back pain greater than 3 months duration 01/23/2015  . 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 Amputation stump complication ([place]) [id number]    Social History   Tobacco Use  . Smoking status: Never Smoker  . Smokeless tobacco: Never Used  Substance Use Topics  . Alcohol use: Yes    Alcohol/week: 0.0 standard drinks    Comment: occ    Current Outpatient Medications:  .  ALPRAZolam (XANAX) 0.25 MG tablet, Take 1 tablet (0.25 mg total) by mouth 2 (two) times daily as needed for anxiety., Disp: 30 tablet, Rfl: 0 .  atorvastatin (LIPITOR) 10 MG tablet, TAKE 1 TABLET(10 MG) BY MOUTH DAILY (Patient taking differently: Take 10 mg by mouth daily. ), Disp: 90 tablet, Rfl: 1 .  cyclobenzaprine (FLEXERIL) 10 MG tablet, Take 1 tablet (10 mg total) by mouth at bedtime. (Patient taking differently: Take 10 mg by mouth daily as needed for muscle spasms. ), Disp: 30 tablet, Rfl: 0 .  gabapentin (NEURONTIN) 300 MG capsule, Take 1 capsule each morning and midday along  with your 600 mg dose to make a total of 900 mg (Patient taking differently: Take 900 mg by mouth in the morning and at bedtime. ), Disp: 60 capsule, Rfl: 3 .  gabapentin (NEURONTIN) 600 MG tablet, Take 1 tablet (600 mg total) by mouth 3 (three) times daily. (Patient taking differently: Take 600 mg by mouth daily. In the afternoon.), Disp: 270 tablet, Rfl: 1 .  ipratropium-albuterol (DUONEB) 0.5-2.5 (3) MG/3ML  SOLN, Take 3 mLs by nebulization 3 (three) times daily., Disp: 360 mL, Rfl: 0 .  lisinopril (ZESTRIL) 20 MG tablet, TAKE 1 TABLET(20 MG) BY MOUTH DAILY, Disp: 90 tablet, Rfl: 1 .  montelukast (SINGULAIR) 10 MG tablet, Take 10 mg by mouth daily. , Disp: , Rfl:  .  oxyCODONE-acetaminophen (PERCOCET) 10-325 MG tablet, TK 1 T PO Q 6 H PRN, Disp: 120 tablet, Rfl: 0 .  polyethylene glycol (MIRALAX / GLYCOLAX) 17 g packet, Take 17 g by mouth daily., Disp: 14 each, Rfl: 0 .  rivaroxaban (XARELTO) 10 MG TABS tablet, Take 1 tablet (10 mg total) by mouth daily., Disp: 30 tablet, Rfl: 0 .  sertraline (ZOLOFT) 50 MG tablet, Take 1 tablet (50 mg total) by mouth daily., Disp: 90 tablet, Rfl: 1 .  tiZANidine (ZANAFLEX) 2 MG tablet, Take 2 mg by mouth daily as needed for muscle spasms. , Disp: , Rfl: 1  Allergies  Allergen Reactions  . Aspirin Swelling  . Hydrocodone Itching    Objective:   There were no vitals taken for this visit.  Patient is well-developed, well-nourished in no acute distress.  Resting comfortably at home.  Head is normocephalic, atraumatic.  No labored breathing.  Speech is clear and coherent with logical content.  Patient is alert and oriented at baseline.   Assessment and Plan:   1. Ankle fracture, bimalleolar, closed, left, initial encounter Continue care as directed by specialist.  Follow-up as scheduled.  Will agree to continue his short acting oxycodone over the next 2 weeks in addition to his chronic pain medication regimen.  Refill sent to the pharmacy.  Follow-up via phone in 2 weeks.  2. DVT prophylaxis 3 more weeks of Xarelto samples provided.  Only able to give him 15 mg dose as we do not have samples of 10 mg and he is unable to afford 10 mg Xarelto as a prescription.  Discussed benefit versus risk of medicine and he agrees he would like to continue.  Thankfully after the next 3 weeks he will be weightbearing and there will be no further need for  Xarelto.    Leeanne Rio, PA-C 03/09/2020

## 2020-03-12 ENCOUNTER — Encounter: Payer: Self-pay | Admitting: Physician Assistant

## 2020-03-12 DIAGNOSIS — J45909 Unspecified asthma, uncomplicated: Secondary | ICD-10-CM | POA: Diagnosis not present

## 2020-03-12 DIAGNOSIS — G894 Chronic pain syndrome: Secondary | ICD-10-CM | POA: Diagnosis not present

## 2020-03-12 DIAGNOSIS — I10 Essential (primary) hypertension: Secondary | ICD-10-CM | POA: Diagnosis not present

## 2020-03-12 DIAGNOSIS — F1010 Alcohol abuse, uncomplicated: Secondary | ICD-10-CM | POA: Diagnosis not present

## 2020-03-12 DIAGNOSIS — M5010 Cervical disc disorder with radiculopathy, unspecified cervical region: Secondary | ICD-10-CM | POA: Diagnosis not present

## 2020-03-12 DIAGNOSIS — M47816 Spondylosis without myelopathy or radiculopathy, lumbar region: Secondary | ICD-10-CM | POA: Diagnosis not present

## 2020-03-12 DIAGNOSIS — S82852D Displaced trimalleolar fracture of left lower leg, subsequent encounter for closed fracture with routine healing: Secondary | ICD-10-CM | POA: Diagnosis not present

## 2020-03-12 DIAGNOSIS — F329 Major depressive disorder, single episode, unspecified: Secondary | ICD-10-CM | POA: Diagnosis not present

## 2020-03-12 DIAGNOSIS — F419 Anxiety disorder, unspecified: Secondary | ICD-10-CM | POA: Diagnosis not present

## 2020-03-13 ENCOUNTER — Encounter: Payer: Medicare HMO | Admitting: Physical Medicine and Rehabilitation

## 2020-03-21 ENCOUNTER — Encounter: Payer: Self-pay | Admitting: Physician Assistant

## 2020-03-21 ENCOUNTER — Other Ambulatory Visit: Payer: Self-pay

## 2020-03-21 ENCOUNTER — Ambulatory Visit (INDEPENDENT_AMBULATORY_CARE_PROVIDER_SITE_OTHER): Payer: Medicare HMO

## 2020-03-21 DIAGNOSIS — R7989 Other specified abnormal findings of blood chemistry: Secondary | ICD-10-CM

## 2020-03-21 DIAGNOSIS — D72829 Elevated white blood cell count, unspecified: Secondary | ICD-10-CM

## 2020-03-21 LAB — CBC WITH DIFFERENTIAL/PLATELET
Basophils Absolute: 0 10*3/uL (ref 0.0–0.1)
Basophils Relative: 0.2 % (ref 0.0–3.0)
Eosinophils Absolute: 0.4 10*3/uL (ref 0.0–0.7)
Eosinophils Relative: 4.9 % (ref 0.0–5.0)
HCT: 42.8 % (ref 39.0–52.0)
Hemoglobin: 14.9 g/dL (ref 13.0–17.0)
Lymphocytes Relative: 34.4 % (ref 12.0–46.0)
Lymphs Abs: 2.5 10*3/uL (ref 0.7–4.0)
MCHC: 34.8 g/dL (ref 30.0–36.0)
MCV: 96.7 fl (ref 78.0–100.0)
Monocytes Absolute: 0.9 10*3/uL (ref 0.1–1.0)
Monocytes Relative: 13.1 % — ABNORMAL HIGH (ref 3.0–12.0)
Neutro Abs: 3.4 10*3/uL (ref 1.4–7.7)
Neutrophils Relative %: 47.4 % (ref 43.0–77.0)
Platelets: 164 10*3/uL (ref 150.0–400.0)
RBC: 4.43 Mil/uL (ref 4.22–5.81)
RDW: 12.7 % (ref 11.5–15.5)
WBC: 7.2 10*3/uL (ref 4.0–10.5)

## 2020-03-21 LAB — COMPREHENSIVE METABOLIC PANEL
ALT: 41 U/L (ref 0–53)
AST: 29 U/L (ref 0–37)
Albumin: 4.2 g/dL (ref 3.5–5.2)
Alkaline Phosphatase: 90 U/L (ref 39–117)
BUN: 12 mg/dL (ref 6–23)
CO2: 24 mEq/L (ref 19–32)
Calcium: 9 mg/dL (ref 8.4–10.5)
Chloride: 103 mEq/L (ref 96–112)
Creatinine, Ser: 0.98 mg/dL (ref 0.40–1.50)
GFR: 80.02 mL/min (ref >60.00)
Glucose, Bld: 108 mg/dL — ABNORMAL HIGH (ref 70–99)
Potassium: 3.9 mEq/L (ref 3.5–5.1)
Sodium: 136 mEq/L (ref 135–145)
Total Bilirubin: 0.4 mg/dL (ref 0.2–1.2)
Total Protein: 6.8 g/dL (ref 6.0–8.3)

## 2020-03-21 NOTE — Telephone Encounter (Signed)
Pt calling back to checking on this

## 2020-03-21 NOTE — Telephone Encounter (Signed)
Please advise on the refill

## 2020-03-22 MED ORDER — OXYCODONE HCL 5 MG PO TABS: 5.0000 mg | ORAL_TABLET | ORAL | 0 refills | Status: AC | PRN

## 2020-03-27 DIAGNOSIS — Z4789 Encounter for other orthopedic aftercare: Secondary | ICD-10-CM | POA: Diagnosis not present

## 2020-03-28 ENCOUNTER — Encounter: Payer: Self-pay | Admitting: Physician Assistant

## 2020-03-28 ENCOUNTER — Telehealth (INDEPENDENT_AMBULATORY_CARE_PROVIDER_SITE_OTHER): Payer: Medicare HMO | Admitting: Physician Assistant

## 2020-03-28 ENCOUNTER — Other Ambulatory Visit: Payer: Self-pay

## 2020-03-28 DIAGNOSIS — S82892D Other fracture of left lower leg, subsequent encounter for closed fracture with routine healing: Secondary | ICD-10-CM

## 2020-03-28 DIAGNOSIS — M47816 Spondylosis without myelopathy or radiculopathy, lumbar region: Secondary | ICD-10-CM | POA: Diagnosis not present

## 2020-03-28 DIAGNOSIS — G894 Chronic pain syndrome: Secondary | ICD-10-CM | POA: Diagnosis not present

## 2020-03-28 DIAGNOSIS — M5010 Cervical disc disorder with radiculopathy, unspecified cervical region: Secondary | ICD-10-CM | POA: Diagnosis not present

## 2020-03-28 DIAGNOSIS — F1010 Alcohol abuse, uncomplicated: Secondary | ICD-10-CM | POA: Diagnosis not present

## 2020-03-28 DIAGNOSIS — I10 Essential (primary) hypertension: Secondary | ICD-10-CM | POA: Diagnosis not present

## 2020-03-28 DIAGNOSIS — S82852D Displaced trimalleolar fracture of left lower leg, subsequent encounter for closed fracture with routine healing: Secondary | ICD-10-CM | POA: Diagnosis not present

## 2020-03-28 DIAGNOSIS — F329 Major depressive disorder, single episode, unspecified: Secondary | ICD-10-CM | POA: Diagnosis not present

## 2020-03-28 DIAGNOSIS — J45909 Unspecified asthma, uncomplicated: Secondary | ICD-10-CM | POA: Diagnosis not present

## 2020-03-28 DIAGNOSIS — F419 Anxiety disorder, unspecified: Secondary | ICD-10-CM | POA: Diagnosis not present

## 2020-03-28 MED ORDER — OXYCODONE HCL 5 MG PO TABS: 5.0000 mg | ORAL_TABLET | ORAL | 0 refills | Status: AC | PRN

## 2020-03-28 NOTE — Progress Notes (Signed)
I have discussed the procedure for the virtual visit with the patient who has given consent to proceed with assessment and treatment.   Shonta Phillis S Nare Gaspari, CMA     

## 2020-03-28 NOTE — Progress Notes (Signed)
Virtual Visit via Video   I connected with patient on 03/28/20 at 11:00 AM EDT by a video enabled telemedicine application and verified that I am speaking with the correct person using two identifiers.  Location patient: Home Location provider: [facility], [facility] Persons participating in the virtual visit: Patient, Provider, [place] (Patina Moore)  I discussed the limitations of evaluation and management by telemedicine and the availability of in person appointments. The patient expressed understanding and agreed to proceed.  Subjective:   HPI:   Patient presents via [place] today to follow-up regarding current pain levels. Patient is being managed by Orthopedics for a bimalleolar fracture of L ankle. Notes seeing his specialist yesterday at which time they removed final casting and placed him in a ortho boot. He is to continue PT at home 3 x weekly for now. Is still to remain non weightbearing for another 2 weeks. Notes pain since cast removal is quite severe. States feels like a sharp pain shooting through his foot, especially with movement. Was told by specialist that pain may initially increase without the constant compression of the case present. States he is taking his medications no more than as directed. Is only taking his short-acting oxycodone in between doses of chronic pain medication instead of every 6 hours as directed. Pain at present is around 8/10. Denies fever, chills, leg swelling, chest pain or SOB. Has completed his course of Xarelto for DVT prophylaxis.   ROS:   See pertinent positives and negatives per HPI.  Patient Active Problem List   Diagnosis Date Noted  . Closed fracture of lateral malleolus 03/08/2020  . Closed left ankle fracture 02/16/2020  . Ankle fracture, bimalleolar, closed, left, initial encounter 02/15/2020  . Morbid obesity ([place]) 12/27/2019  . HTN (hypertension) 11/18/2019  . Colon cancer screening 05/25/2018  . Anxiety and depression  03/03/2017  . Cervical disc disorder with radiculopathy of cervical region 07/24/2015  . 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 Visit for preventive health examination 02/04/2015  . Chronic back pain greater than 3 months duration 01/23/2015  . 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 Smoking status: Never Smoker  . Smokeless tobacco: Never Used  Substance Use Topics  . Alcohol use: Yes    Alcohol/week: 0.0 standard drinks    Comment: occ    Current Outpatient Medications:  .  ALPRAZolam (XANAX) 0.25 MG tablet, Take 1 tablet (0.25 mg total) by mouth 2 (two) times daily as needed for anxiety., Disp: 30 tablet, Rfl: 0 .  atorvastatin (LIPITOR) 10 MG tablet, TAKE 1 TABLET(10 MG) BY MOUTH DAILY (Patient taking differently: Take 10 mg by mouth daily. ), Disp: 90 tablet, Rfl: 1 .  cyclobenzaprine (FLEXERIL) 10 MG tablet, Take 1 tablet (10 mg total) by mouth at bedtime. (Patient taking differently: Take 10 mg by mouth daily as needed for muscle spasms. ), Disp: 30 tablet, Rfl: 0 .  gabapentin (NEURONTIN) 300 MG capsule, Take 1 capsule each morning and midday along with your 600 mg dose to make a total of 900 mg (Patient taking differently: Take 900 mg by mouth in the morning and at bedtime. ), Disp: 60 capsule, Rfl: 3 .  gabapentin (NEURONTIN) 600 MG tablet, Take 1 tablet (600 mg total) by mouth 3 (three) times daily. (Patient taking differently: Take 600 mg by  mouth daily. In the afternoon.), Disp: 270 tablet, Rfl: 1 .  ipratropium-albuterol (DUONEB) 0.5-2.5 (3) MG/3ML SOLN, Take 3 mLs by nebulization 3 (three) times daily., Disp: 360 mL, Rfl: 0 .  lisinopril (ZESTRIL) 20 MG tablet, TAKE 1 TABLET(20 MG) BY MOUTH DAILY, Disp: 90 tablet, Rfl: 1 .  montelukast (SINGULAIR) 10 MG tablet, Take 10 mg by mouth  daily. , Disp: , Rfl:  .  oxyCODONE-acetaminophen (PERCOCET) 10-325 MG tablet, TK 1 T PO Q 6 H PRN, Disp: 120 tablet, Rfl: 0 .  polyethylene glycol (MIRALAX / GLYCOLAX) 17 g packet, Take 17 g by mouth daily., Disp: 14 each, Rfl: 0 .  Rivaroxaban (XARELTO) 15 MG TABS tablet, Take 1 tablet (15 mg total) by mouth daily with supper., Disp: 21 tablet, Rfl: 0 .  sertraline (ZOLOFT) 50 MG tablet, Take 1 tablet (50 mg total) by mouth daily., Disp: 90 tablet, Rfl: 1 .  tiZANidine (ZANAFLEX) 2 MG tablet, Take 2 mg by mouth daily as needed for muscle spasms. , Disp: , Rfl: 1  Allergies  Allergen Reactions  . Aspirin Swelling  . Hydrocodone Itching    Objective:   There were no vitals taken for this visit.  Patient is well-developed, well-nourished in no acute distress but can tell pain is in pain.  Resting in recliner chair at home.  Head is normocephalic, atraumatic.  No labored breathing.  Speech is clear and coherent with logical content.  Patient is alert and oriented at baseline.   Assessment and Plan:   1. Closed fracture of left ankle with routine healing, subsequent encounter Will continue current pain medication regimen. Discussed with patient ok to take medication up to as directed for now, especially giving recent changes and that PT is becoming more intense. Follow-up 2 weeks. Will wean back down as soon as possible.     Leeanne Rio, [place] 03/28/2020

## 2020-03-30 DIAGNOSIS — G894 Chronic pain syndrome: Secondary | ICD-10-CM | POA: Diagnosis not present

## 2020-03-30 DIAGNOSIS — S82852D Displaced trimalleolar fracture of left lower leg, subsequent encounter for closed fracture with routine healing: Secondary | ICD-10-CM | POA: Diagnosis not present

## 2020-03-30 DIAGNOSIS — I10 Essential (primary) hypertension: Secondary | ICD-10-CM | POA: Diagnosis not present

## 2020-03-30 DIAGNOSIS — F329 Major depressive disorder, single episode, unspecified: Secondary | ICD-10-CM | POA: Diagnosis not present

## 2020-03-30 DIAGNOSIS — F1010 Alcohol abuse, uncomplicated: Secondary | ICD-10-CM | POA: Diagnosis not present

## 2020-03-30 DIAGNOSIS — F419 Anxiety disorder, unspecified: Secondary | ICD-10-CM | POA: Diagnosis not present

## 2020-03-30 DIAGNOSIS — M5010 Cervical disc disorder with radiculopathy, unspecified cervical region: Secondary | ICD-10-CM | POA: Diagnosis not present

## 2020-03-30 DIAGNOSIS — M47816 Spondylosis without myelopathy or radiculopathy, lumbar region: Secondary | ICD-10-CM | POA: Diagnosis not present

## 2020-03-30 DIAGNOSIS — J45909 Unspecified asthma, uncomplicated: Secondary | ICD-10-CM | POA: Diagnosis not present

## 2020-03-31 DIAGNOSIS — M5010 Cervical disc disorder with radiculopathy, unspecified cervical region: Secondary | ICD-10-CM | POA: Diagnosis not present

## 2020-03-31 DIAGNOSIS — G894 Chronic pain syndrome: Secondary | ICD-10-CM | POA: Diagnosis not present

## 2020-03-31 DIAGNOSIS — F329 Major depressive disorder, single episode, unspecified: Secondary | ICD-10-CM | POA: Diagnosis not present

## 2020-03-31 DIAGNOSIS — F419 Anxiety disorder, unspecified: Secondary | ICD-10-CM | POA: Diagnosis not present

## 2020-03-31 DIAGNOSIS — M47816 Spondylosis without myelopathy or radiculopathy, lumbar region: Secondary | ICD-10-CM | POA: Diagnosis not present

## 2020-03-31 DIAGNOSIS — I10 Essential (primary) hypertension: Secondary | ICD-10-CM | POA: Diagnosis not present

## 2020-03-31 DIAGNOSIS — J45909 Unspecified asthma, uncomplicated: Secondary | ICD-10-CM | POA: Diagnosis not present

## 2020-03-31 DIAGNOSIS — S82852D Displaced trimalleolar fracture of left lower leg, subsequent encounter for closed fracture with routine healing: Secondary | ICD-10-CM | POA: Diagnosis not present

## 2020-03-31 DIAGNOSIS — F1010 Alcohol abuse, uncomplicated: Secondary | ICD-10-CM | POA: Diagnosis not present

## 2020-04-04 ENCOUNTER — Encounter: Payer: Self-pay | Admitting: Physician Assistant

## 2020-04-04 ENCOUNTER — Other Ambulatory Visit: Payer: Self-pay | Admitting: Physician Assistant

## 2020-04-04 DIAGNOSIS — M5010 Cervical disc disorder with radiculopathy, unspecified cervical region: Secondary | ICD-10-CM

## 2020-04-04 DIAGNOSIS — F32A Depression, unspecified: Secondary | ICD-10-CM

## 2020-04-04 DIAGNOSIS — G8929 Other chronic pain: Secondary | ICD-10-CM

## 2020-04-04 DIAGNOSIS — M549 Dorsalgia, unspecified: Secondary | ICD-10-CM

## 2020-04-04 DIAGNOSIS — F419 Anxiety disorder, unspecified: Secondary | ICD-10-CM

## 2020-04-04 NOTE — Telephone Encounter (Signed)
Alprazolam LFD 03/08/20 #30 with 0 refills Oxycodone LFD 03/08/20 #120 with 0 refills LOV 03/28/20 NOV none

## 2020-04-05 ENCOUNTER — Other Ambulatory Visit: Payer: Self-pay | Admitting: Physician Assistant

## 2020-04-05 MED ORDER — OXYCODONE-ACETAMINOPHEN 10-325 MG PO TABS: ORAL_TABLET | ORAL | 0 refills | Status: DC

## 2020-04-05 MED ORDER — ALPRAZOLAM 0.25 MG PO TABS: 0.2500 mg | ORAL_TABLET | Freq: Two times a day (BID) | ORAL | 0 refills | Status: DC | PRN

## 2020-04-05 MED ORDER — OXYCODONE HCL 5 MG PO TABS: 5.0000 mg | ORAL_TABLET | ORAL | 0 refills | Status: DC | PRN

## 2020-04-06 ENCOUNTER — Other Ambulatory Visit: Payer: Self-pay | Admitting: Physician Assistant

## 2020-04-06 MED ORDER — MONTELUKAST SODIUM 10 MG PO TABS: 10.0000 mg | ORAL_TABLET | Freq: Every day | ORAL | 1 refills | Status: DC

## 2020-04-10 DIAGNOSIS — I10 Essential (primary) hypertension: Secondary | ICD-10-CM | POA: Diagnosis not present

## 2020-04-10 DIAGNOSIS — M47816 Spondylosis without myelopathy or radiculopathy, lumbar region: Secondary | ICD-10-CM | POA: Diagnosis not present

## 2020-04-10 DIAGNOSIS — S82852D Displaced trimalleolar fracture of left lower leg, subsequent encounter for closed fracture with routine healing: Secondary | ICD-10-CM | POA: Diagnosis not present

## 2020-04-10 DIAGNOSIS — F1010 Alcohol abuse, uncomplicated: Secondary | ICD-10-CM | POA: Diagnosis not present

## 2020-04-10 DIAGNOSIS — F419 Anxiety disorder, unspecified: Secondary | ICD-10-CM | POA: Diagnosis not present

## 2020-04-10 DIAGNOSIS — F329 Major depressive disorder, single episode, unspecified: Secondary | ICD-10-CM | POA: Diagnosis not present

## 2020-04-10 DIAGNOSIS — G894 Chronic pain syndrome: Secondary | ICD-10-CM | POA: Diagnosis not present

## 2020-04-10 DIAGNOSIS — M5010 Cervical disc disorder with radiculopathy, unspecified cervical region: Secondary | ICD-10-CM | POA: Diagnosis not present

## 2020-04-10 DIAGNOSIS — J45909 Unspecified asthma, uncomplicated: Secondary | ICD-10-CM | POA: Diagnosis not present

## 2020-04-12 DIAGNOSIS — F1010 Alcohol abuse, uncomplicated: Secondary | ICD-10-CM | POA: Diagnosis not present

## 2020-04-12 DIAGNOSIS — I10 Essential (primary) hypertension: Secondary | ICD-10-CM | POA: Diagnosis not present

## 2020-04-12 DIAGNOSIS — G894 Chronic pain syndrome: Secondary | ICD-10-CM | POA: Diagnosis not present

## 2020-04-12 DIAGNOSIS — M5010 Cervical disc disorder with radiculopathy, unspecified cervical region: Secondary | ICD-10-CM | POA: Diagnosis not present

## 2020-04-12 DIAGNOSIS — F419 Anxiety disorder, unspecified: Secondary | ICD-10-CM | POA: Diagnosis not present

## 2020-04-12 DIAGNOSIS — F329 Major depressive disorder, single episode, unspecified: Secondary | ICD-10-CM | POA: Diagnosis not present

## 2020-04-12 DIAGNOSIS — J45909 Unspecified asthma, uncomplicated: Secondary | ICD-10-CM | POA: Diagnosis not present

## 2020-04-12 DIAGNOSIS — M47816 Spondylosis without myelopathy or radiculopathy, lumbar region: Secondary | ICD-10-CM | POA: Diagnosis not present

## 2020-04-12 DIAGNOSIS — S82852D Displaced trimalleolar fracture of left lower leg, subsequent encounter for closed fracture with routine healing: Secondary | ICD-10-CM | POA: Diagnosis not present

## 2020-04-13 ENCOUNTER — Encounter: Payer: Self-pay | Admitting: Physician Assistant

## 2020-04-13 MED ORDER — OXYCODONE HCL 5 MG PO TABS: 5.0000 mg | ORAL_TABLET | ORAL | 0 refills | Status: AC | PRN

## 2020-04-16 DIAGNOSIS — F329 Major depressive disorder, single episode, unspecified: Secondary | ICD-10-CM | POA: Diagnosis not present

## 2020-04-16 DIAGNOSIS — G894 Chronic pain syndrome: Secondary | ICD-10-CM | POA: Diagnosis not present

## 2020-04-16 DIAGNOSIS — F419 Anxiety disorder, unspecified: Secondary | ICD-10-CM | POA: Diagnosis not present

## 2020-04-16 DIAGNOSIS — F1010 Alcohol abuse, uncomplicated: Secondary | ICD-10-CM | POA: Diagnosis not present

## 2020-04-16 DIAGNOSIS — S82852D Displaced trimalleolar fracture of left lower leg, subsequent encounter for closed fracture with routine healing: Secondary | ICD-10-CM | POA: Diagnosis not present

## 2020-04-16 DIAGNOSIS — M79675 Pain in left toe(s): Secondary | ICD-10-CM | POA: Diagnosis not present

## 2020-04-16 DIAGNOSIS — M47816 Spondylosis without myelopathy or radiculopathy, lumbar region: Secondary | ICD-10-CM | POA: Diagnosis not present

## 2020-04-16 DIAGNOSIS — I10 Essential (primary) hypertension: Secondary | ICD-10-CM | POA: Diagnosis not present

## 2020-04-16 DIAGNOSIS — J45909 Unspecified asthma, uncomplicated: Secondary | ICD-10-CM | POA: Diagnosis not present

## 2020-04-16 DIAGNOSIS — M109 Gout, unspecified: Secondary | ICD-10-CM | POA: Insufficient documentation

## 2020-04-16 DIAGNOSIS — M5010 Cervical disc disorder with radiculopathy, unspecified cervical region: Secondary | ICD-10-CM | POA: Diagnosis not present

## 2020-04-18 ENCOUNTER — Encounter: Payer: Self-pay | Admitting: Physician Assistant

## 2020-04-18 DIAGNOSIS — J45909 Unspecified asthma, uncomplicated: Secondary | ICD-10-CM | POA: Diagnosis not present

## 2020-04-18 DIAGNOSIS — M5010 Cervical disc disorder with radiculopathy, unspecified cervical region: Secondary | ICD-10-CM | POA: Diagnosis not present

## 2020-04-18 DIAGNOSIS — F419 Anxiety disorder, unspecified: Secondary | ICD-10-CM | POA: Diagnosis not present

## 2020-04-18 DIAGNOSIS — G894 Chronic pain syndrome: Secondary | ICD-10-CM | POA: Diagnosis not present

## 2020-04-18 DIAGNOSIS — F1010 Alcohol abuse, uncomplicated: Secondary | ICD-10-CM | POA: Diagnosis not present

## 2020-04-18 DIAGNOSIS — S82852D Displaced trimalleolar fracture of left lower leg, subsequent encounter for closed fracture with routine healing: Secondary | ICD-10-CM | POA: Diagnosis not present

## 2020-04-18 DIAGNOSIS — M47816 Spondylosis without myelopathy or radiculopathy, lumbar region: Secondary | ICD-10-CM | POA: Diagnosis not present

## 2020-04-18 DIAGNOSIS — I10 Essential (primary) hypertension: Secondary | ICD-10-CM | POA: Diagnosis not present

## 2020-04-18 DIAGNOSIS — F329 Major depressive disorder, single episode, unspecified: Secondary | ICD-10-CM | POA: Diagnosis not present

## 2020-04-20 ENCOUNTER — Ambulatory Visit: Payer: Medicare HMO | Admitting: Physician Assistant

## 2020-04-23 ENCOUNTER — Other Ambulatory Visit: Payer: Self-pay

## 2020-04-23 ENCOUNTER — Ambulatory Visit (INDEPENDENT_AMBULATORY_CARE_PROVIDER_SITE_OTHER): Payer: Medicare HMO | Admitting: Physician Assistant

## 2020-04-23 ENCOUNTER — Encounter: Payer: Self-pay | Admitting: Physician Assistant

## 2020-04-23 VITALS — BP 136/84 | HR 83 | Temp 97.7°F | Ht 67.0 in | Wt 270.0 lb

## 2020-04-23 DIAGNOSIS — F329 Major depressive disorder, single episode, unspecified: Secondary | ICD-10-CM | POA: Diagnosis not present

## 2020-04-23 DIAGNOSIS — F419 Anxiety disorder, unspecified: Secondary | ICD-10-CM | POA: Diagnosis not present

## 2020-04-23 DIAGNOSIS — M109 Gout, unspecified: Secondary | ICD-10-CM

## 2020-04-23 DIAGNOSIS — J45909 Unspecified asthma, uncomplicated: Secondary | ICD-10-CM | POA: Diagnosis not present

## 2020-04-23 DIAGNOSIS — M5010 Cervical disc disorder with radiculopathy, unspecified cervical region: Secondary | ICD-10-CM | POA: Diagnosis not present

## 2020-04-23 DIAGNOSIS — S82852D Displaced trimalleolar fracture of left lower leg, subsequent encounter for closed fracture with routine healing: Secondary | ICD-10-CM | POA: Diagnosis not present

## 2020-04-23 DIAGNOSIS — F1010 Alcohol abuse, uncomplicated: Secondary | ICD-10-CM | POA: Diagnosis not present

## 2020-04-23 DIAGNOSIS — G894 Chronic pain syndrome: Secondary | ICD-10-CM | POA: Diagnosis not present

## 2020-04-23 DIAGNOSIS — M47816 Spondylosis without myelopathy or radiculopathy, lumbar region: Secondary | ICD-10-CM | POA: Diagnosis not present

## 2020-04-23 DIAGNOSIS — I10 Essential (primary) hypertension: Secondary | ICD-10-CM | POA: Diagnosis not present

## 2020-04-23 LAB — CBC WITH DIFFERENTIAL/PLATELET
Basophils Absolute: 0 10*3/uL (ref 0.0–0.1)
Basophils Relative: 0.3 % (ref 0.0–3.0)
Eosinophils Absolute: 0.1 10*3/uL (ref 0.0–0.7)
Eosinophils Relative: 1 % (ref 0.0–5.0)
HCT: 44.2 % (ref 39.0–52.0)
Hemoglobin: 15.3 g/dL (ref 13.0–17.0)
Lymphocytes Relative: 24.9 % (ref 12.0–46.0)
Lymphs Abs: 2.1 10*3/uL (ref 0.7–4.0)
MCHC: 34.5 g/dL (ref 30.0–36.0)
MCV: 96.3 fl (ref 78.0–100.0)
Monocytes Absolute: 0.8 10*3/uL (ref 0.1–1.0)
Monocytes Relative: 9.2 % (ref 3.0–12.0)
Neutro Abs: 5.4 10*3/uL (ref 1.4–7.7)
Neutrophils Relative %: 64.6 % (ref 43.0–77.0)
Platelets: 205 10*3/uL (ref 150.0–400.0)
RBC: 4.59 Mil/uL (ref 4.22–5.81)
RDW: 14.2 % (ref 11.5–15.5)
WBC: 8.4 10*3/uL (ref 4.0–10.5)

## 2020-04-23 LAB — COMPREHENSIVE METABOLIC PANEL
ALT: 37 U/L (ref 0–53)
AST: 25 U/L (ref 0–37)
Albumin: 4.4 g/dL (ref 3.5–5.2)
Alkaline Phosphatase: 83 U/L (ref 39–117)
BUN: 17 mg/dL (ref 6–23)
CO2: 26 mEq/L (ref 19–32)
Calcium: 9.3 mg/dL (ref 8.4–10.5)
Chloride: 99 mEq/L (ref 96–112)
Creatinine, Ser: 0.93 mg/dL (ref 0.40–1.50)
GFR: 84.98 mL/min (ref >60.00)
Glucose, Bld: 104 mg/dL — ABNORMAL HIGH (ref 70–99)
Potassium: 4.2 mEq/L (ref 3.5–5.1)
Sodium: 136 mEq/L (ref 135–145)
Total Bilirubin: 0.7 mg/dL (ref 0.2–1.2)
Total Protein: 7.1 g/dL (ref 6.0–8.3)

## 2020-04-23 LAB — URIC ACID: Uric Acid, Serum: 9 mg/dL — ABNORMAL HIGH (ref 4.0–7.8)

## 2020-04-23 NOTE — Patient Instructions (Signed)
Please go to the lab today for blood work.  I will call you with your results. We will alter treatment regimen(s) if indicated by your results.   I am making sure kidney function is stable before sending in the medicine to have on hand in case of another gout flare. I am also checking with insurance to see what will be covered the best.   Hang in there!   Gout  Gout is painful swelling of your joints. Gout is a type of arthritis. It is caused by having too much uric acid in your body. Uric acid is a chemical that is made when your body breaks down substances called purines. If your body has too much uric acid, sharp crystals can form and build up in your joints. This causes pain and swelling. Gout attacks can happen quickly and be very painful (acute gout). Over time, the attacks can affect more joints and happen more often (chronic gout). What are the causes?  Too much uric acid in your blood. This can happen because: ? Your kidneys do not remove enough uric acid from your blood. ? Your body makes too much uric acid. ? You eat too many foods that are high in purines. These foods include organ meats, some seafood, and beer.  Trauma or stress. What increases the risk?  Having a family history of gout.  Being male and middle-aged.  Being male and having gone through menopause.  Being very overweight (obese).  Drinking alcohol, especially beer.  Not having enough water in the body (being dehydrated).  Losing weight too quickly.  Having an organ transplant.  Having lead poisoning.  Taking certain medicines.  Having kidney disease.  Having a skin condition called psoriasis. What are the signs or symptoms? An attack of acute gout usually happens in just one joint. The most common place is the big toe. Attacks often start at night. Other joints that may be affected include joints of the feet, ankle, knee, fingers, wrist, or elbow. Symptoms of an attack may include:  Very  bad pain.  Warmth.  Swelling.  Stiffness.  Shiny, red, or purple skin.  Tenderness. The affected joint may be very painful to touch.  Chills and fever. Chronic gout may cause symptoms more often. More joints may be involved. You may also have white or yellow lumps (tophi) on your hands or feet or in other areas near your joints. How is this treated?  Treatment for this condition has two phases: treating an acute attack and preventing future attacks.  Acute gout treatment may include: ? NSAIDs. ? Steroids. These are taken by mouth or injected into a joint. ? Colchicine. This medicine relieves pain and swelling. It can be given by mouth or through an IV tube.  Preventive treatment may include: ? Taking small doses of NSAIDs or colchicine daily. ? Using a medicine that reduces uric acid levels in your blood. ? Making changes to your diet. You may need to see a food expert (dietitian) about what to eat and drink to prevent gout. Follow these instructions at home: During a gout attack   If told, put ice on the painful area: ? Put ice in a plastic bag. ? Place a towel between your skin and the bag. ? Leave the ice on for 20 minutes, 2-3 times a day.  Raise (elevate) the painful joint above the level of your heart as often as you can.  Rest the joint as much as possible. If the joint  is in your leg, you may be given crutches.  Follow instructions from your doctor about what you cannot eat or drink. Avoiding future gout attacks  Eat a low-purine diet. Avoid foods and drinks such as: ? Liver. ? Kidney. ? Anchovies. ? Asparagus. ? Herring. ? Mushrooms. ? Mussels. ? Beer.  Stay at a healthy weight. If you want to lose weight, talk with your doctor. Do not lose weight too fast.  Start or continue an exercise plan as told by your doctor. Eating and drinking  Drink enough fluids to keep your pee (urine) pale yellow.  If you drink alcohol: ? Limit how much you use  to:  0-1 drink a day for women.  0-2 drinks a day for men. ? Be aware of how much alcohol is in your drink. In the [place] one drink equals one 12 oz bottle of beer (355 mL), one 5 oz glass of wine (148 mL), or one 1 oz glass of hard liquor (44 mL). General instructions  Take over-the-counter and prescription medicines only as told by your doctor.  Do not drive or use heavy machinery while taking prescription pain medicine.  Return to your normal activities as told by your doctor. Ask your doctor what activities are safe for you.  Keep all follow-up visits as told by your doctor. This is important. Contact a doctor if:  You have another gout attack.  You still have symptoms of a gout attack after 10 days of treatment.  You have problems (side effects) because of your medicines.  You have chills or a fever.  You have burning pain when you pee (urinate).  You have pain in your lower back or belly. Get help right away if:  You have very bad pain.  Your pain cannot be controlled.  You cannot pee. Summary  Gout is painful swelling of the joints.  The most common site of pain is the big toe, but it can affect other joints.  Medicines and avoiding some foods can help to prevent and treat gout attacks. This information is not intended to replace advice given to you by your health care provider. Make sure you discuss any questions you have with your health care provider. Document Revised: 05/12/2018 Document Reviewed: 05/12/2018 Elsevier Patient Education  Daniel [employer].

## 2020-04-23 NOTE — Progress Notes (Signed)
Patient presents to clinic today for reassessment after recent gout attack of left first MTP joint.  Patient is status post treatment with steroid taper by his orthopedic provider.  Was recommended that he have follow-up labs including uric acid levels.  Patient notes pain and swelling are much improved.  Still having his chronic pain, of course, but notes all the other pain has resolved.  Is weightbearing.  Has follow-up scheduled with orthopedist on July 14, at which time they will likely be removing his leg brace.  Patient notes keeping well hydrated.  Denies shellfish consumption.  Does have a few beers from time to time.  Denies prior history of gout attack.  Past Medical History:  Diagnosis Date  . Allergy   . Anxiety   . Asthma    SEASONAL   . Blood transfusion without reported diagnosis   . Chronic back pain    Neurostimulator  . Depression   . Environmental allergies   . Hypertension   . Neuromuscular disorder ([place])   . Neuropathy   . Seasonal allergic conjunctivitis     Current Outpatient Medications on File Prior to Visit  Medication Sig Dispense Refill  . ALPRAZolam (XANAX) 0.25 MG tablet Take 1 tablet (0.25 mg total) by mouth 2 (two) times daily as needed for anxiety. 30 tablet 0  . atorvastatin (LIPITOR) 10 MG tablet TAKE 1 TABLET(10 MG) BY MOUTH DAILY (Patient taking differently: Take 10 mg by mouth daily. ) 90 tablet 1  . cyclobenzaprine (FLEXERIL) 10 MG tablet Take 1 tablet (10 mg total) by mouth at bedtime. (Patient taking differently: Take 10 mg by mouth daily as needed for muscle spasms. ) 30 tablet 0  . gabapentin (NEURONTIN) 300 MG capsule Take 1 capsule each morning and midday along with your 600 mg dose to make a total of 900 mg (Patient taking differently: Take 900 mg by mouth in the morning and at bedtime. ) 60 capsule 3  . gabapentin (NEURONTIN) 600 MG tablet Take 1 tablet (600 mg total) by mouth 3 (three) times daily. (Patient taking differently: Take 600 mg  by mouth daily. In the afternoon.) 270 tablet 1  . ipratropium-albuterol (DUONEB) 0.5-2.5 (3) MG/3ML SOLN Take 3 mLs by nebulization 3 (three) times daily. 360 mL 0  . lisinopril (ZESTRIL) 20 MG tablet TAKE 1 TABLET(20 MG) BY MOUTH DAILY 90 tablet 1  . montelukast (SINGULAIR) 10 MG tablet Take 1 tablet (10 mg total) by mouth daily. 90 tablet 1  . oxyCODONE-acetaminophen (PERCOCET) 10-325 MG tablet TK 1 T PO Q 6 H PRN 120 tablet 0  . sertraline (ZOLOFT) 50 MG tablet Take 1 tablet (50 mg total) by mouth daily. 90 tablet 1  . tiZANidine (ZANAFLEX) 2 MG tablet Take 2 mg by mouth daily as needed for muscle spasms.   1   No current facility-administered medications on file prior to visit.    Allergies  Allergen Reactions  . Aspirin Swelling  . Hydrocodone Itching    Family History  Problem Relation Age of Onset  . Diabetes Mother        Living  . Sleep apnea Mother   . Diabetes Father 108       Deceased  . Hypertension Father   . Jaundice Father   . Hemophilia Father   . Alcoholism Father   . Cancer Maternal Grandfather        Throat  . Esophageal cancer Maternal Grandfather   . Cancer Paternal Uncle  Throat  . Esophageal cancer Paternal Uncle   . Asthma Sister   . Diabetes Sister   . Healthy Son        X1  . Healthy Daughter        X1  . Colon polyps Neg Hx   . Colon cancer Neg Hx   . Rectal cancer Neg Hx   . Stomach cancer Neg Hx     Social History   Socioeconomic History  . Marital status: Divorced    Spouse name: Not on file  . Number of children: Not on file  . Years of education: Not on file  . Highest education level: Not on file  Occupational History  . Not on file  Tobacco Use  . Smoking status: Never Smoker  . Smokeless tobacco: Never Used  Vaping Use  . Vaping Use: Never used  Substance and Sexual Activity  . Alcohol use: Yes    Alcohol/week: 0.0 standard drinks    Comment: occ  . Drug use: No  . Sexual activity: Yes  Other Topics Concern    . Not on file  Social History Narrative  . Not on file   Social Determinants of Health   Financial Resource Strain:   . Difficulty of Paying Living Expenses:   Food Insecurity:   . Worried About Charity fundraiser in the Last Year:   . Arboriculturist in the Last Year:   Transportation Needs:   . Film/video editor (Medical):   Marland Kitchen Lack of Transportation (Non-Medical):   Physical Activity:   . Days of Exercise per Week:   . Minutes of Exercise per Session:   Stress:   . Feeling of Stress :   Social Connections:   . Frequency of Communication with Friends and Family:   . Frequency of Social Gatherings with Friends and Family:   . Attends Religious Services:   . Active Member of Clubs or Organizations:   . Attends Archivist Meetings:   Marland Kitchen Marital Status:     Review of Systems - See HPI.  All other ROS are negative.  BP 136/84   Pulse 83   Temp 97.7 F (36.5 C)   Ht 5\' 7"  (1.702 m)   Wt 270 lb (122.5 kg)   SpO2 96%   BMI 42.29 kg/m   Physical Exam Vitals reviewed.  Constitutional:      Appearance: Normal appearance.  HENT:     Head: Normocephalic and atraumatic.  Cardiovascular:     Rate and Rhythm: Normal rate and regular rhythm.     Heart sounds: Normal heart sounds.  Pulmonary:     Breath sounds: Normal breath sounds.  Musculoskeletal:     Cervical back: Neck supple.  Feet:     Comments: Wrapped per Ortho and in orthopedic brace. Patient declines removal. Neurological:     Mental Status: He is alert.     Recent Results (from the past 2160 hour(s))  Lipase, blood     Status: None   Collection Time: 02/06/20  8:39 PM  Result Value Ref Range   Lipase 24 11 - 51 U/L    Comment: Performed at [facility], [address] [place], [place] [address]  Comprehensive metabolic panel     Status: Abnormal   Collection Time: 02/06/20  8:39 PM  Result Value Ref Range   Sodium 137 135 - 145 mmol/L   Potassium 3.7 3.5 - 5.1 mmol/L    Chloride  101 98 - 111 mmol/L   CO2 22 22 - 32 mmol/L   Glucose, Bld 100 (H) 70 - 99 mg/dL    Comment: Glucose reference range applies only to samples taken after fasting for at least 8 hours.   BUN 10 6 - 20 mg/dL   Creatinine, Ser 1.00 0.61 - 1.24 mg/dL   Calcium 9.0 8.9 - 10.3 mg/dL   Total Protein 7.5 6.5 - 8.1 g/dL   Albumin 4.2 3.5 - 5.0 g/dL   AST 39 15 - 41 U/L   ALT 46 (H) 0 - 44 U/L   Alkaline Phosphatase 60 38 - 126 U/L   Total Bilirubin 0.5 0.3 - 1.2 mg/dL   GFR calc non Af Amer >60 >60 mL/min   GFR calc Af Amer >60 >60 mL/min   Anion gap 14 5 - 15    Comment: Performed at [facility], [address] [place], [place] [address]  CBC     Status: Abnormal   Collection Time: 02/06/20  8:39 PM  Result Value Ref Range   WBC 7.5 4.0 - 10.5 K/uL   RBC 4.50 4.22 - 5.81 MIL/uL   Hemoglobin 15.5 13.0 - 17.0 g/dL   HCT 44.9 39 - 52 %   MCV 99.8 80.0 - 100.0 fL   MCH 34.4 (H) 26.0 - 34.0 pg   MCHC 34.5 30.0 - 36.0 g/dL   RDW 13.0 11.5 - 15.5 %   Platelets 203 150 - 400 K/uL   nRBC 0.0 0.0 - 0.2 %    Comment: Performed at [facility], [address] [place], [place] [address]  Urinalysis, Routine w reflex microscopic     Status: None   Collection Time: 02/06/20  8:39 PM  Result Value Ref Range   Color, Urine YELLOW YELLOW   APPearance CLEAR CLEAR   Specific Gravity, Urine 1.015 1.005 - 1.030   pH 6.0 5.0 - 8.0   Glucose, UA NEGATIVE NEGATIVE mg/dL   Hgb urine dipstick NEGATIVE NEGATIVE   Bilirubin Urine NEGATIVE NEGATIVE   Ketones, ur NEGATIVE NEGATIVE mg/dL   Protein, ur NEGATIVE NEGATIVE mg/dL   Nitrite NEGATIVE NEGATIVE   Leukocytes,Ua NEGATIVE NEGATIVE    Comment: Microscopic not done on urines with negative protein, blood, leukocytes, nitrite, or glucose < 500 mg/dL. Performed at [facility], [address] [place], [place] [address]   Urine culture     Status: None   Collection Time: 02/06/20  8:39 PM   Specimen: Urine,  Random  Result Value Ref Range   Specimen Description      URINE, RANDOM Performed at [facility], [address] [place], [place] [address]    Special Requests      NONE Performed at [facility], [address] [place], [place] [address]    Culture      NO GROWTH Performed at [facility], [place] [address] [place], [place] [address]    Report Status 02/08/2020 FINAL   Lipase, blood     Status: None   Collection Time: 02/09/20  3:35 PM  Result Value Ref Range   Lipase 25 11 - 51 U/L    Comment: Performed at [facility], [place] [address], [place], [place] [address]  Comprehensive metabolic panel     Status: Abnormal   Collection Time: 02/09/20  3:35 PM  Result Value Ref Range   Sodium 139 135 -  145 mmol/L   Potassium 3.7 3.5 - 5.1 mmol/L   Chloride 101 98 - 111 mmol/L   CO2 25 22 - 32 mmol/L   Glucose, Bld 112 (H) 70 - 99 mg/dL    Comment: Glucose reference range applies only to samples taken after fasting for at least 8 hours.   BUN 11 6 - 20 mg/dL   Creatinine, Ser 1.08 0.61 - 1.24 mg/dL   Calcium 9.2 8.9 - 10.3 mg/dL   Total Protein 7.1 6.5 - 8.1 g/dL   Albumin 4.2 3.5 - 5.0 g/dL   AST 41 15 - 41 U/L   ALT 45 (H) 0 - 44 U/L   Alkaline Phosphatase 68 38 - 126 U/L   Total Bilirubin 0.7 0.3 - 1.2 mg/dL   GFR calc non Af Amer >60 >60 mL/min   GFR calc Af Amer >60 >60 mL/min   Anion gap 13 5 - 15    Comment: Performed at [facility], [place] [address], [place] [address]  CBC     Status: None   Collection Time: 02/09/20  3:35 PM  Result Value Ref Range   WBC 8.2 4.0 - 10.5 K/uL   RBC 4.59 4.22 - 5.81 MIL/uL   Hemoglobin 15.5 13.0 - 17.0 g/dL   HCT 45.9 39 - 52 %   MCV 100.0 80.0 - 100.0 fL   MCH 33.8 26.0 - 34.0 pg   MCHC 33.8 30.0 - 36.0 g/dL   RDW 12.6 11.5 - 15.5 %   Platelets 209 150 - 400 K/uL   nRBC 0.0 0.0 - 0.2 %    Comment: Performed at [facility], [place] [address], [place], [place] [address]  CBC with  Differential     Status: Abnormal   Collection Time: 02/15/20  4:54 PM  Result Value Ref Range   WBC 5.4 4.0 - 10.5 K/uL   RBC 4.71 4.22 - 5.81 MIL/uL   Hemoglobin 16.1 13.0 - 17.0 g/dL   HCT 46.4 39 - 52 %   MCV 98.5 80.0 - 100.0 fL   MCH 34.2 (H) 26.0 - 34.0 pg   MCHC 34.7 30.0 - 36.0 g/dL   RDW 12.4 11.5 - 15.5 %   Platelets 183 150 - 400 K/uL   nRBC 0.0 0.0 - 0.2 %   Neutrophils Relative % 54 %   Neutro Abs 2.9 1.7 - 7.7 K/uL   Lymphocytes Relative 34 %   Lymphs Abs 1.8 0.7 - 4.0 K/uL   Monocytes Relative 10 %   Monocytes Absolute 0.5 0 - 1 K/uL   Eosinophils Relative 1 %   Eosinophils Absolute 0.1 0 - 0 K/uL   Basophils Relative 1 %   Basophils Absolute 0.0 0 - 0 K/uL   Immature Granulocytes 0 %   Abs Immature Granulocytes 0.02 0.00 - 0.07 K/uL    Comment: Performed at [facility] [address], [place], [place] [address]  Comprehensive metabolic panel     Status: Abnormal   Collection Time: 02/15/20  4:54 PM  Result Value Ref Range   Sodium 139 135 - 145 mmol/L   Potassium 4.1 3.5 - 5.1 mmol/L   Chloride 104 98 - 111 mmol/L   CO2 19 (L) 22 - 32 mmol/L   Glucose, Bld 104 (H) 70 - 99 mg/dL    Comment: Glucose reference range applies only to samples taken after fasting for at least 8 hours.   BUN 10 6 - 20  mg/dL   Creatinine, Ser 0.95 0.61 - 1.24 mg/dL   Calcium 8.8 (L) 8.9 - 10.3 mg/dL   Total Protein 7.0 6.5 - 8.1 g/dL   Albumin 3.9 3.5 - 5.0 g/dL   AST 72 (H) 15 - 41 U/L   ALT 61 (H) 0 - 44 U/L   Alkaline Phosphatase 74 38 - 126 U/L   Total Bilirubin 0.8 0.3 - 1.2 mg/dL   GFR calc non Af Amer >60 >60 mL/min   GFR calc Af Amer >60 >60 mL/min   Anion gap 16 (H) 5 - 15    Comment: Performed at [facility] [address], [place], [place] [address]  Ethanol     Status: Abnormal   Collection Time: 02/15/20  4:54 PM  Result Value Ref Range   Alcohol, Ethyl (B) 193 (H) <10 mg/dL    Comment: (NOTE) Lowest detectable limit for serum alcohol is 10  mg/dL. For medical purposes only. Performed at [facility], [place] [address], [place], [place] [address]   SARS Coronavirus 2 by RT PCR     Status: None   Collection Time: 02/16/20  4:37 AM  Result Value Ref Range   SARS Coronavirus 2 NEGATIVE NEGATIVE    Comment: (NOTE) Result indicates the ABSENCE of SARS-CoV-2 RNA in the patient specimen.  The lowest concentration of SARS-CoV-2 viral copies this assay can detect in nasopharyngeal swab specimens is 500 copies / mL.  A negative result does not preclude SARS-CoV-2 infection and should not be used as the sole basis for patient management decisions. A negative result may occur with improper specimen collection / handling, submission of a specimen other than nasopharyngeal swab, presence of viral mutation(s) within the areas targeted by this assay, and inadequate number of viral copies (<500 copies / mL) present.  Negative results must be combined with clinical observations, patient history, and epidemiological information.  The expected result is NEGATIVE.  Patient Fact Sheet:  BlogSelections.co.uk   Provider Fact Sheet:  https://lucas.com/   This test is not yet approved or cleared by the [place] FDA and  has been authorized for  detection and/or diagnosis of SARS-CoV-2 by FDA under an Emergency Use Authorization (EUA).  This EUA will remain in effect (meaning this test can be used) for the duration of  the COVID-19 declaration under Section 564(b)(1) of the Act, 21 U.S.C. section 360bbb-3(b)(1), unless the authorization is terminated or revoked sooner Performed at [facility], [place] [address] [place], [place] [address]   CBC     Status: Abnormal   Collection Time: 02/16/20  5:08 AM  Result Value Ref Range   WBC 9.6 4.0 - 10.5 K/uL   RBC 4.23 4.22 - 5.81 MIL/uL   Hemoglobin 14.5 13.0 - 17.0 g/dL   HCT 42.7 39 - 52 %   MCV 100.9 (H) 80.0 - 100.0 fL   MCH 34.3 (H)  26.0 - 34.0 pg   MCHC 34.0 30.0 - 36.0 g/dL   RDW 12.8 11.5 - 15.5 %   Platelets 160 150 - 400 K/uL   nRBC 0.0 0.0 - 0.2 %    Comment: Performed at [facility], [place] [address], [place], [place] [address]  Comprehensive metabolic panel     Status: Abnormal   Collection Time: 02/16/20  5:08 AM  Result Value Ref Range   Sodium 136 135 - 145 mmol/L   Potassium 4.1 3.5 - 5.1 mmol/L   Chloride 103 98 -  111 mmol/L   CO2 22 22 - 32 mmol/L   Glucose, Bld 117 (H) 70 - 99 mg/dL    Comment: Glucose reference range applies only to samples taken after fasting for at least 8 hours.   BUN 9 6 - 20 mg/dL   Creatinine, Ser 0.95 0.61 - 1.24 mg/dL   Calcium 8.2 (L) 8.9 - 10.3 mg/dL   Total Protein 6.6 6.5 - 8.1 g/dL   Albumin 4.0 3.5 - 5.0 g/dL   AST 61 (H) 15 - 41 U/L   ALT 59 (H) 0 - 44 U/L   Alkaline Phosphatase 72 38 - 126 U/L   Total Bilirubin 0.8 0.3 - 1.2 mg/dL   GFR calc non Af Amer >60 >60 mL/min   GFR calc Af Amer >60 >60 mL/min   Anion gap 11 5 - 15    Comment: Performed at [facility] [address], [place], [place] [address]  Magnesium     Status: None   Collection Time: 02/16/20  5:08 AM  Result Value Ref Range   Magnesium 1.9 1.7 - 2.4 mg/dL    Comment: Performed at [facility] [address], [place], [place] [address]  Phosphorus     Status: None   Collection Time: 02/16/20  5:08 AM  Result Value Ref Range   Phosphorus 3.0 2.5 - 4.6 mg/dL    Comment: Performed at [facility] [address], [place], [place] [address]  Glucose, capillary     Status: Abnormal   Collection Time: 02/16/20  6:46 AM  Result Value Ref Range   Glucose-Capillary 112 (H) 70 - 99 mg/dL    Comment: Glucose reference range applies only to samples taken after fasting for at least 8 hours.  Glucose, capillary     Status: Abnormal   Collection Time: 02/16/20 11:39 AM  Result Value Ref Range   Glucose-Capillary 101 (H) 70 - 99 mg/dL    Comment: Glucose reference range  applies only to samples taken after fasting for at least 8 hours.  Lipase, blood     Status: None   Collection Time: 02/16/20  2:14 PM  Result Value Ref Range   Lipase 23 11 - 51 U/L    Comment: Performed at [facility], [place] [address] [place], [place] [address]  Glucose, capillary     Status: None   Collection Time: 02/16/20  4:46 PM  Result Value Ref Range   Glucose-Capillary 96 70 - 99 mg/dL    Comment: Glucose reference range applies only to samples taken after fasting for at least 8 hours.  CBC     Status: Abnormal   Collection Time: 02/17/20  5:05 AM  Result Value Ref Range   WBC 7.1 4.0 - 10.5 K/uL   RBC 4.04 (L) 4.22 - 5.81 MIL/uL   Hemoglobin 13.9 13.0 - 17.0 g/dL   HCT 41.9 39 - 52 %   MCV 103.7 (H) 80.0 - 100.0 fL   MCH 34.4 (H) 26.0 - 34.0 pg   MCHC 33.2 30.0 - 36.0 g/dL   RDW 12.7 11.5 - 15.5 %   Platelets 146 (L) 150 - 400 K/uL   nRBC 0.0 0.0 - 0.2 %    Comment: Performed at [facility] [address] [place], [place] [address]  Comprehensive metabolic panel     Status: Abnormal   Collection Time: 02/17/20  5:05 AM  Result Value Ref Range   Sodium 135 135 -  145 mmol/L   Potassium 4.2 3.5 - 5.1 mmol/L   Chloride 99 98 - 111 mmol/L   CO2 25 22 - 32 mmol/L   Glucose, Bld 116 (H) 70 - 99 mg/dL    Comment: Glucose reference range applies only to samples taken after fasting for at least 8 hours.   BUN 9 6 - 20 mg/dL   Creatinine, Ser 1.09 0.61 - 1.24 mg/dL   Calcium 8.7 (L) 8.9 - 10.3 mg/dL   Total Protein 6.3 (L) 6.5 - 8.1 g/dL   Albumin 3.6 3.5 - 5.0 g/dL   AST 35 15 - 41 U/L   ALT 46 (H) 0 - 44 U/L   Alkaline Phosphatase 59 38 - 126 U/L   Total Bilirubin 0.9 0.3 - 1.2 mg/dL   GFR calc non Af Amer >60 >60 mL/min   GFR calc Af Amer >60 >60 mL/min   Anion gap 11 5 - 15    Comment: Performed at [facility] [address] [place], [place] [address]  Hemoglobin A1c     Status: None   Collection Time: 02/21/20  2:14 AM  Result Value Ref  Range   Hgb A1c MFr Bld 5.3 4.8 - 5.6 %    Comment: (NOTE) Pre diabetes:          5.7%-6.4% Diabetes:              >6.4% Glycemic control for   <7.0% adults with diabetes    Mean Plasma Glucose 105.41 mg/dL    Comment: Performed at [facility] [address], [place], [place] [address]  CBC with Differential/Platelet     Status: Abnormal   Collection Time: 02/22/20  8:24 AM  Result Value Ref Range   WBC 5.6 4.0 - 10.5 K/uL   RBC 4.38 4.22 - 5.81 MIL/uL   Hemoglobin 14.9 13.0 - 17.0 g/dL   HCT 44.3 39 - 52 %   MCV 101.1 (H) 80.0 - 100.0 fL   MCH 34.0 26.0 - 34.0 pg   MCHC 33.6 30.0 - 36.0 g/dL   RDW 12.1 11.5 - 15.5 %   Platelets 188 150 - 400 K/uL   nRBC 0.0 0.0 - 0.2 %   Neutrophils Relative % 52 %   Neutro Abs 2.9 1.7 - 7.7 K/uL   Lymphocytes Relative 26 %   Lymphs Abs 1.5 0.7 - 4.0 K/uL   Monocytes Relative 17 %   Monocytes Absolute 1.0 0 - 1 K/uL   Eosinophils Relative 3 %   Eosinophils Absolute 0.2 0 - 0 K/uL   Basophils Relative 1 %   Basophils Absolute 0.1 0 - 0 K/uL   Immature Granulocytes 1 %   Abs Immature Granulocytes 0.03 0.00 - 0.07 K/uL    Comment: Performed at [facility] [address] [place],  [address]  Comprehensive metabolic panel     Status: Abnormal   Collection Time: 02/22/20  8:24 AM  Result Value Ref Range   Sodium 133 (L) 135 - 145 mmol/L   Potassium 4.3 3.5 - 5.1 mmol/L   Chloride 102 98 - 111 mmol/L   CO2 20 (L) 22 - 32 mmol/L   Glucose, Bld 129 (H) 70 - 99 mg/dL    Comment: Glucose reference range applies only to samples taken after fasting for at least 8 hours.   BUN 13 6 - 20 mg/dL   Creatinine, Ser 0.98 0.61 - 1.24 mg/dL   Calcium 8.7 (L) 8.9 -  10.3 mg/dL   Total Protein 6.8 6.5 - 8.1 g/dL   Albumin 3.6 3.5 - 5.0 g/dL   AST 63 (H) 15 - 41 U/L   ALT 74 (H) 0 - 44 U/L   Alkaline Phosphatase 76 38 - 126 U/L   Total Bilirubin 0.5 0.3 - 1.2 mg/dL   GFR calc non Af Amer >60 >60 mL/min   GFR calc Af Amer >60 >60  mL/min   Anion gap 11 5 - 15    Comment: Performed at [facility] [address] [place], [place] [address]  CBC with Differential/Platelet     Status: Abnormal   Collection Time: 02/23/20  3:48 AM  Result Value Ref Range   WBC 6.0 4.0 - 10.5 K/uL   RBC 4.07 (L) 4.22 - 5.81 MIL/uL   Hemoglobin 13.9 13.0 - 17.0 g/dL   HCT 41.8 39 - 52 %   MCV 102.7 (H) 80.0 - 100.0 fL   MCH 34.2 (H) 26.0 - 34.0 pg   MCHC 33.3 30.0 - 36.0 g/dL   RDW 12.2 11.5 - 15.5 %   Platelets 203 150 - 400 K/uL   nRBC 0.0 0.0 - 0.2 %   Neutrophils Relative % 44 %   Neutro Abs 2.7 1.7 - 7.7 K/uL   Lymphocytes Relative 28 %   Lymphs Abs 1.7 0.7 - 4.0 K/uL   Monocytes Relative 22 %   Monocytes Absolute 1.4 (H) 0 - 1 K/uL   Eosinophils Relative 4 %   Eosinophils Absolute 0.3 0 - 0 K/uL   Basophils Relative 1 %   Basophils Absolute 0.0 0 - 0 K/uL   Immature Granulocytes 1 %   Abs Immature Granulocytes 0.03 0.00 - 0.07 K/uL    Comment: Performed at [facility], [place] [address], [place], [place] [address]  Comprehensive metabolic panel     Status: Abnormal   Collection Time: 02/23/20  3:48 AM  Result Value Ref Range   Sodium 134 (L) 135 - 145 mmol/L   Potassium 4.5 3.5 - 5.1 mmol/L   Chloride 101 98 - 111 mmol/L   CO2 24 22 - 32 mmol/L   Glucose, Bld 120 (H) 70 - 99 mg/dL    Comment: Glucose reference range applies only to samples taken after fasting for at least 8 hours.   BUN 24 (H) 6 - 20 mg/dL   Creatinine, Ser 1.32 (H) 0.61 - 1.24 mg/dL   Calcium 8.8 (L) 8.9 - 10.3 mg/dL   Total Protein 6.3 (L) 6.5 - 8.1 g/dL   Albumin 3.4 (L) 3.5 - 5.0 g/dL   AST 54 (H) 15 - 41 U/L   ALT 68 (H) 0 - 44 U/L   Alkaline Phosphatase 79 38 - 126 U/L   Total Bilirubin 0.6 0.3 - 1.2 mg/dL   GFR calc non Af Amer >60 >60 mL/min   GFR calc Af Amer >60 >60 mL/min   Anion gap 9 5 - 15    Comment: Performed at [facility] [address] [place], [place] [address]  Magnesium     Status: Abnormal    Collection Time: 02/23/20  3:48 AM  Result Value Ref Range   Magnesium 2.5 (H) 1.7 - 2.4 mg/dL    Comment: Performed at [facility] [address], [place], [place] [address]  Phosphorus     Status: None   Collection Time: 02/23/20  3:48 AM  Result Value Ref Range   Phosphorus  3.8 2.5 - 4.6 mg/dL    Comment: Performed at [facility], [place] [address] [place], [place] [address]  Surgical pcr screen     Status: Abnormal   Collection Time: 02/23/20  5:59 AM   Specimen: Nasal Mucosa; Nasal Swab  Result Value Ref Range   MRSA, PCR NEGATIVE NEGATIVE   Staphylococcus aureus POSITIVE (A) NEGATIVE    Comment: (NOTE) The Xpert SA Assay (FDA approved for NASAL specimens in patients 69 years of age and older), is one component of a comprehensive surveillance program. It is not intended to diagnose infection nor to guide or monitor treatment. Performed at [facility], [place] [address], [place], [place] [address]   CBC with Differential/Platelet     Status: Abnormal   Collection Time: 02/24/20  4:37 AM  Result Value Ref Range   WBC 14.6 (H) 4.0 - 10.5 K/uL   RBC 3.99 (L) 4.22 - 5.81 MIL/uL   Hemoglobin 13.7 13.0 - 17.0 g/dL   HCT 40.9 39 - 52 %   MCV 102.5 (H) 80.0 - 100.0 fL   MCH 34.3 (H) 26.0 - 34.0 pg   MCHC 33.5 30.0 - 36.0 g/dL   RDW 12.0 11.5 - 15.5 %   Platelets 214 150 - 400 K/uL   nRBC 0.0 0.0 - 0.2 %   Neutrophils Relative % 88 %   Neutro Abs 12.8 (H) 1.7 - 7.7 K/uL   Lymphocytes Relative 4 %   Lymphs Abs 0.6 (L) 0.7 - 4.0 K/uL   Monocytes Relative 7 %   Monocytes Absolute 1.0 0 - 1 K/uL   Eosinophils Relative 0 %   Eosinophils Absolute 0.0 0 - 0 K/uL   Basophils Relative 0 %   Basophils Absolute 0.0 0 - 0 K/uL   Immature Granulocytes 1 %   Abs Immature Granulocytes 0.07 0.00 - 0.07 K/uL    Comment: Performed at [facility], [place] [address], [place], [place] [address]  Comprehensive metabolic panel     Status: Abnormal   Collection Time:  02/24/20  4:37 AM  Result Value Ref Range   Sodium 136 135 - 145 mmol/L   Potassium 5.3 (H) 3.5 - 5.1 mmol/L   Chloride 105 98 - 111 mmol/L   CO2 23 22 - 32 mmol/L   Glucose, Bld 255 (H) 70 - 99 mg/dL    Comment: Glucose reference range applies only to samples taken after fasting for at least 8 hours.   BUN 20 6 - 20 mg/dL   Creatinine, Ser 1.08 0.61 - 1.24 mg/dL   Calcium 8.9 8.9 - 10.3 mg/dL   Total Protein 6.5 6.5 - 8.1 g/dL   Albumin 3.4 (L) 3.5 - 5.0 g/dL   AST 43 (H) 15 - 41 U/L   ALT 60 (H) 0 - 44 U/L   Alkaline Phosphatase 76 38 - 126 U/L   Total Bilirubin 0.6 0.3 - 1.2 mg/dL   GFR calc non Af Amer >60 >60 mL/min   GFR calc Af Amer >60 >60 mL/min   Anion gap 8 5 - 15    Comment: Performed at [facility] [address], [place], [place] [address]  Magnesium     Status: None   Collection Time: 02/24/20  4:37 AM  Result Value Ref Range   Magnesium 2.3 1.7 - 2.4 mg/dL    Comment: Performed at [facility] [address], [place], [place] [address]  Phosphorus  Status: Abnormal   Collection Time: 02/24/20  4:37 AM  Result Value Ref Range   Phosphorus 1.1 (L) 2.5 - 4.6 mg/dL    Comment: Performed at [facility] [address], [place], [place] [address]  Potassium     Status: None   Collection Time: 02/24/20  2:17 PM  Result Value Ref Range   Potassium 4.7 3.5 - 5.1 mmol/L    Comment: Performed at [facility] [address] [place], [place] [address]  CBC with Differential/Platelet     Status: Abnormal   Collection Time: 02/25/20  4:21 AM  Result Value Ref Range   WBC 10.5 4.0 - 10.5 K/uL   RBC 3.81 (L) 4.22 - 5.81 MIL/uL   Hemoglobin 12.8 (L) 13.0 - 17.0 g/dL   HCT 39.2 39 - 52 %   MCV 102.9 (H) 80.0 - 100.0 fL   MCH 33.6 26.0 - 34.0 pg   MCHC 32.7 30.0 - 36.0 g/dL   RDW 12.1 11.5 - 15.5 %   Platelets 196 150 - 400 K/uL   nRBC 0.0 0.0 - 0.2 %   Neutrophils Relative % 60 %   Neutro Abs 6.4 1.7 - 7.7 K/uL   Lymphocytes Relative 26  %   Lymphs Abs 2.7 0.7 - 4.0 K/uL   Monocytes Relative 11 %   Monocytes Absolute 1.2 (H) 0 - 1 K/uL   Eosinophils Relative 1 %   Eosinophils Absolute 0.1 0 - 0 K/uL   Basophils Relative 1 %   Basophils Absolute 0.1 0 - 0 K/uL   Immature Granulocytes 1 %   Abs Immature Granulocytes 0.05 0.00 - 0.07 K/uL    Comment: Performed at [facility], [place] [address] [place], [place] [address]  Comprehensive metabolic panel     Status: Abnormal   Collection Time: 02/25/20  4:21 AM  Result Value Ref Range   Sodium 138 135 - 145 mmol/L   Potassium 4.2 3.5 - 5.1 mmol/L   Chloride 107 98 - 111 mmol/L   CO2 24 22 - 32 mmol/L   Glucose, Bld 173 (H) 70 - 99 mg/dL    Comment: Glucose reference range applies only to samples taken after fasting for at least 8 hours.   BUN 17 6 - 20 mg/dL   Creatinine, Ser 0.90 0.61 - 1.24 mg/dL   Calcium 8.8 (L) 8.9 - 10.3 mg/dL   Total Protein 6.5 6.5 - 8.1 g/dL   Albumin 3.3 (L) 3.5 - 5.0 g/dL   AST 30 15 - 41 U/L   ALT 47 (H) 0 - 44 U/L   Alkaline Phosphatase 66 38 - 126 U/L   Total Bilirubin 0.3 0.3 - 1.2 mg/dL   GFR calc non Af Amer >60 >60 mL/min   GFR calc Af Amer >60 >60 mL/min   Anion gap 7 5 - 15    Comment: Performed at [facility] [address], [place], [place] [address]  Magnesium     Status: None   Collection Time: 02/25/20  4:21 AM  Result Value Ref Range   Magnesium 2.2 1.7 - 2.4 mg/dL    Comment: Performed at [facility] [address], [place], [place] [address]  Phosphorus     Status: None   Collection Time: 02/25/20  4:21 AM  Result Value Ref Range   Phosphorus 2.5 2.5 - 4.6 mg/dL    Comment: Performed at [facility] [address]  [place], [place] [address]  CBC with Differential/Platelet     Status: Abnormal   Collection Time: 02/26/20  7:05 AM  Result Value Ref Range   WBC 8.5 4.0 - 10.5 K/uL   RBC 4.15 (L) 4.22 - 5.81 MIL/uL   Hemoglobin 14.0 13.0 - 17.0 g/dL   HCT 43.1 39 - 52 %   MCV 103.9 (H)  80.0 - 100.0 fL   MCH 33.7 26.0 - 34.0 pg   MCHC 32.5 30.0 - 36.0 g/dL   RDW 12.1 11.5 - 15.5 %   Platelets 239 150 - 400 K/uL   nRBC 0.0 0.0 - 0.2 %   Neutrophils Relative % 45 %   Neutro Abs 3.9 1.7 - 7.7 K/uL   Lymphocytes Relative 36 %   Lymphs Abs 3.0 0.7 - 4.0 K/uL   Monocytes Relative 14 %   Monocytes Absolute 1.2 (H) 0 - 1 K/uL   Eosinophils Relative 3 %   Eosinophils Absolute 0.2 0 - 0 K/uL   Basophils Relative 1 %   Basophils Absolute 0.1 0 - 0 K/uL   Immature Granulocytes 1 %   Abs Immature Granulocytes 0.05 0.00 - 0.07 K/uL    Comment: Performed at [facility], [place] [address], [place], [place] [address]  Comprehensive metabolic panel     Status: Abnormal   Collection Time: 02/26/20  7:05 AM  Result Value Ref Range   Sodium 139 135 - 145 mmol/L   Potassium 3.9 3.5 - 5.1 mmol/L   Chloride 100 98 - 111 mmol/L   CO2 29 22 - 32 mmol/L   Glucose, Bld 131 (H) 70 - 99 mg/dL    Comment: Glucose reference range applies only to samples taken after fasting for at least 8 hours.   BUN 15 6 - 20 mg/dL   Creatinine, Ser 0.96 0.61 - 1.24 mg/dL   Calcium 8.9 8.9 - 10.3 mg/dL   Total Protein 6.7 6.5 - 8.1 g/dL   Albumin 3.5 3.5 - 5.0 g/dL   AST 30 15 - 41 U/L   ALT 46 (H) 0 - 44 U/L   Alkaline Phosphatase 78 38 - 126 U/L   Total Bilirubin 0.3 0.3 - 1.2 mg/dL   GFR calc non Af Amer >60 >60 mL/min   GFR calc Af Amer >60 >60 mL/min   Anion gap 10 5 - 15    Comment: Performed at [facility], [place] [address], [place], [place] [address]  Magnesium     Status: None   Collection Time: 02/26/20  7:05 AM  Result Value Ref Range   Magnesium 2.2 1.7 - 2.4 mg/dL    Comment: Performed at [facility] [address] [place], [place] [address]  Phosphorus     Status: None   Collection Time: 02/26/20  7:05 AM  Result Value Ref Range   Phosphorus 3.7 2.5 - 4.6 mg/dL    Comment: Performed at [facility] [address], [place], [place] [address]  CBC with  Differential/Platelet     Status: Abnormal   Collection Time: 02/27/20  3:58 AM  Result Value Ref Range   WBC 8.3 4.0 - 10.5 K/uL   RBC 4.25 4.22 - 5.81 MIL/uL   Hemoglobin 14.5 13.0 - 17.0 g/dL   HCT 43.2 39 - 52 %   MCV 101.6 (H) 80.0 - 100.0 fL   MCH 34.1 (H) 26.0 - 34.0 pg   MCHC 33.6 30.0 - 36.0 g/dL  RDW 12.0 11.5 - 15.5 %   Platelets 259 150 - 400 K/uL   nRBC 0.0 0.0 - 0.2 %   Neutrophils Relative % 49 %   Neutro Abs 4.2 1.7 - 7.7 K/uL   Lymphocytes Relative 33 %   Lymphs Abs 2.7 0.7 - 4.0 K/uL   Monocytes Relative 12 %   Monocytes Absolute 1.0 0 - 1 K/uL   Eosinophils Relative 4 %   Eosinophils Absolute 0.3 0 - 0 K/uL   Basophils Relative 1 %   Basophils Absolute 0.1 0 - 0 K/uL   Immature Granulocytes 1 %   Abs Immature Granulocytes 0.05 0.00 - 0.07 K/uL    Comment: Performed at [facility] [address], [place], [place] [address]  Comprehensive metabolic panel     Status: Abnormal   Collection Time: 02/27/20  3:58 AM  Result Value Ref Range   Sodium 139 135 - 145 mmol/L   Potassium 4.3 3.5 - 5.1 mmol/L   Chloride 100 98 - 111 mmol/L   CO2 30 22 - 32 mmol/L   Glucose, Bld 144 (H) 70 - 99 mg/dL    Comment: Glucose reference range applies only to samples taken after fasting for at least 8 hours.   BUN 15 6 - 20 mg/dL   Creatinine, Ser 1.11 0.61 - 1.24 mg/dL   Calcium 9.2 8.9 - 10.3 mg/dL   Total Protein 7.0 6.5 - 8.1 g/dL   Albumin 3.6 3.5 - 5.0 g/dL   AST 37 15 - 41 U/L   ALT 51 (H) 0 - 44 U/L   Alkaline Phosphatase 85 38 - 126 U/L   Total Bilirubin 0.4 0.3 - 1.2 mg/dL   GFR calc non Af Amer >60 >60 mL/min   GFR calc Af Amer >60 >60 mL/min   Anion gap 9 5 - 15    Comment: Performed at [facility], [place] [address], [place], [place] [address]  Magnesium     Status: None   Collection Time: 02/27/20  3:58 AM  Result Value Ref Range   Magnesium 2.3 1.7 - 2.4 mg/dL    Comment: Performed at [facility] [address] [place], [place]  [address]  Phosphorus     Status: Abnormal   Collection Time: 02/27/20  3:58 AM  Result Value Ref Range   Phosphorus 4.9 (H) 2.5 - 4.6 mg/dL    Comment: Performed at [facility] [address] [place], [place] [address]  CBC with Differential/Platelet     Status: Abnormal   Collection Time: 02/28/20  4:04 AM  Result Value Ref Range   WBC 8.2 4.0 - 10.5 K/uL   RBC 4.36 4.22 - 5.81 MIL/uL   Hemoglobin 14.6 13.0 - 17.0 g/dL   HCT 43.5 39 - 52 %   MCV 99.8 80.0 - 100.0 fL   MCH 33.5 26.0 - 34.0 pg   MCHC 33.6 30.0 - 36.0 g/dL   RDW 11.9 11.5 - 15.5 %   Platelets 270 150 - 400 K/uL   nRBC 0.0 0.0 - 0.2 %   Neutrophils Relative % 54 %   Neutro Abs 4.5 1.7 - 7.7 K/uL   Lymphocytes Relative 30 %   Lymphs Abs 2.5 0.7 - 4.0 K/uL   Monocytes Relative 10 %   Monocytes Absolute 0.8 0 - 1 K/uL   Eosinophils Relative 4 %   Eosinophils Absolute 0.4 0 - 0 K/uL   Basophils Relative 1 %  Basophils Absolute 0.0 0 - 0 K/uL   Immature Granulocytes 1 %   Abs Immature Granulocytes 0.08 (H) 0.00 - 0.07 K/uL    Comment: Performed at [facility], [place] [address] [place], [place] [address]  Comprehensive metabolic panel     Status: Abnormal   Collection Time: 02/28/20  4:04 AM  Result Value Ref Range   Sodium 136 135 - 145 mmol/L   Potassium 4.2 3.5 - 5.1 mmol/L   Chloride 99 98 - 111 mmol/L   CO2 27 22 - 32 mmol/L   Glucose, Bld 137 (H) 70 - 99 mg/dL    Comment: Glucose reference range applies only to samples taken after fasting for at least 8 hours.   BUN 14 6 - 20 mg/dL   Creatinine, Ser 1.04 0.61 - 1.24 mg/dL   Calcium 9.1 8.9 - 10.3 mg/dL   Total Protein 7.1 6.5 - 8.1 g/dL   Albumin 3.6 3.5 - 5.0 g/dL   AST 40 15 - 41 U/L   ALT 54 (H) 0 - 44 U/L   Alkaline Phosphatase 83 38 - 126 U/L   Total Bilirubin 0.5 0.3 - 1.2 mg/dL   GFR calc non Af Amer >60 >60 mL/min   GFR calc Af Amer >60 >60 mL/min   Anion gap 10 5 - 15    Comment: Performed at [facility], [place] [address], [place], [place] [address]  Magnesium     Status: None   Collection Time: 02/28/20  4:04 AM  Result Value Ref Range   Magnesium 2.2 1.7 - 2.4 mg/dL    Comment: Performed at [facility] [address] [place], [place] [address]  Phosphorus     Status: None   Collection Time: 02/28/20  4:04 AM  Result Value Ref Range   Phosphorus 4.0 2.5 - 4.6 mg/dL    Comment: Performed at [facility] [address] [place], [place] [address]  CBC with Differential/Platelet     Status: Abnormal   Collection Time: 03/21/20  8:32 AM  Result Value Ref Range   WBC 7.2 4.0 - 10.5 K/uL   RBC 4.43 4.22 - 5.81 Mil/uL   Hemoglobin 14.9 13.0 - 17.0 g/dL   HCT 42.8 39 - 52 %   MCV 96.7 78.0 - 100.0 fl   MCHC 34.8 30.0 - 36.0 g/dL   RDW 12.7 11.5 - 15.5 %   Platelets 164.0 150 - 400 K/uL   Neutrophils Relative % 47.4 43 - 77 %   Lymphocytes Relative 34.4 12 - 46 %   Monocytes Relative 13.1 (H) 3 - 12 %   Eosinophils Relative 4.9 0 - 5 %   Basophils Relative 0.2 0 - 3 %   Neutro Abs 3.4 1.4 - 7.7 K/uL   Lymphs Abs 2.5 0.7 - 4.0 K/uL   Monocytes Absolute 0.9 0 - 1 K/uL   Eosinophils Absolute 0.4 0 - 0 K/uL   Basophils Absolute 0.0 0 - 0 K/uL  Comprehensive metabolic panel     Status: Abnormal   Collection Time: 03/21/20  8:32 AM  Result Value Ref Range   Sodium 136 135 - 145 mEq/L   Potassium 3.9 3.5 - 5.1 mEq/L   Chloride 103 96 - 112 mEq/L   CO2 24 19 - 32 mEq/L   Glucose, Bld 108 (H) 70 - 99 mg/dL   BUN 12 6 - 23 mg/dL   Creatinine, Ser 0.98 0.40 - 1.50 mg/dL  Total Bilirubin 0.4 0.2 - 1.2 mg/dL   Alkaline Phosphatase 90 39 - 117 U/L   AST 29 0 - 37 U/L   ALT 41 0 - 53 U/L   Total Protein 6.8 6.0 - 8.3 g/dL   Albumin 4.2 3.5 - 5.2 g/dL   GFR 80.02 >60.00 mL/min   Calcium 9.0 8.4 - 10.5 mg/dL    Assessment/Plan: 1. Acute gout of left foot, unspecified cause First episode.  No prior history.  Proper diet reviewed with patient.  Increase fluids.  Will check repeat labs today  including uric acid level to further assess. - Comprehensive metabolic panel - CBC with Differential/Platelet - Uric acid    Leeanne Rio, PA-C

## 2020-04-24 ENCOUNTER — Other Ambulatory Visit: Payer: Self-pay

## 2020-04-24 DIAGNOSIS — R7989 Other specified abnormal findings of blood chemistry: Secondary | ICD-10-CM

## 2020-04-24 DIAGNOSIS — M109 Gout, unspecified: Secondary | ICD-10-CM

## 2020-04-24 MED ORDER — ALLOPURINOL 100 MG PO TABS
100.0000 mg | ORAL_TABLET | Freq: Every day | ORAL | 1 refills | Status: DC
Start: 2020-04-24 — End: 2020-10-09

## 2020-04-30 ENCOUNTER — Other Ambulatory Visit: Payer: Self-pay | Admitting: Physician Assistant

## 2020-04-30 DIAGNOSIS — F32A Depression, unspecified: Secondary | ICD-10-CM

## 2020-04-30 DIAGNOSIS — F419 Anxiety disorder, unspecified: Secondary | ICD-10-CM

## 2020-04-30 DIAGNOSIS — M5010 Cervical disc disorder with radiculopathy, unspecified cervical region: Secondary | ICD-10-CM

## 2020-04-30 DIAGNOSIS — M549 Dorsalgia, unspecified: Secondary | ICD-10-CM

## 2020-04-30 DIAGNOSIS — G8929 Other chronic pain: Secondary | ICD-10-CM

## 2020-04-30 NOTE — Telephone Encounter (Signed)
Last OV 04/23/20 Alprazolam last filled 04/05/20 #30 with 0 Oxycodone last filled 04/05/20 #120 with 0

## 2020-05-01 MED ORDER — CYCLOBENZAPRINE HCL 10 MG PO TABS: 10.0000 mg | ORAL_TABLET | Freq: Every day | ORAL | 0 refills | Status: DC

## 2020-05-01 MED ORDER — OXYCODONE-ACETAMINOPHEN 10-325 MG PO TABS: ORAL_TABLET | ORAL | 0 refills | Status: DC

## 2020-05-01 MED ORDER — ALPRAZOLAM 0.25 MG PO TABS: 0.2500 mg | ORAL_TABLET | Freq: Two times a day (BID) | ORAL | 0 refills | Status: DC | PRN

## 2020-05-16 ENCOUNTER — Encounter: Payer: Self-pay | Admitting: Physician Assistant

## 2020-05-16 DIAGNOSIS — Z4789 Encounter for other orthopedic aftercare: Secondary | ICD-10-CM | POA: Diagnosis not present

## 2020-05-16 DIAGNOSIS — M25572 Pain in left ankle and joints of left foot: Secondary | ICD-10-CM | POA: Diagnosis not present

## 2020-05-24 ENCOUNTER — Encounter: Payer: Self-pay | Admitting: Physician Assistant

## 2020-05-24 DIAGNOSIS — G8929 Other chronic pain: Secondary | ICD-10-CM

## 2020-05-24 DIAGNOSIS — M5010 Cervical disc disorder with radiculopathy, unspecified cervical region: Secondary | ICD-10-CM

## 2020-05-24 DIAGNOSIS — F32A Depression, unspecified: Secondary | ICD-10-CM

## 2020-05-24 NOTE — Telephone Encounter (Signed)
Indication for chronic opioid: Chronic back pain, spinal stenosis, phantom limb pain Medication and dose: Oxycodone-APAP 10/325 # pills per month: 120 on 05/01/20 Last UDS date: 12/24/18 Opioid Treatment Agreement signed (Y/N): Yes on 10/07/18 Opioid Treatment Agreement last reviewed with patient:   NCCSRS reviewed this encounter (include red flags):     Xanax last rx 05/01/20 #30 LOV: 04/23/20 Acute gout of left foot  Patient requesting refill of medications early on 05/30/20

## 2020-05-27 MED ORDER — OXYCODONE-ACETAMINOPHEN 10-325 MG PO TABS: ORAL_TABLET | ORAL | 0 refills | Status: DC

## 2020-05-27 MED ORDER — ALPRAZOLAM 0.25 MG PO TABS: 0.2500 mg | ORAL_TABLET | Freq: Two times a day (BID) | ORAL | 0 refills | Status: DC | PRN

## 2020-05-29 MED ORDER — ALPRAZOLAM 0.25 MG PO TABS: 0.2500 mg | ORAL_TABLET | Freq: Two times a day (BID) | ORAL | 0 refills | Status: DC | PRN

## 2020-05-29 MED ORDER — OXYCODONE-ACETAMINOPHEN 10-325 MG PO TABS: ORAL_TABLET | ORAL | 0 refills | Status: DC

## 2020-05-29 NOTE — Addendum Note (Signed)
Addended by: Brunetta Jeans on: 05/29/2020 09:07 AM   Modules accepted: Orders

## 2020-05-30 ENCOUNTER — Encounter: Payer: Self-pay | Admitting: Podiatry

## 2020-05-30 ENCOUNTER — Ambulatory Visit (INDEPENDENT_AMBULATORY_CARE_PROVIDER_SITE_OTHER): Payer: Medicare HMO | Admitting: Podiatry

## 2020-05-30 ENCOUNTER — Other Ambulatory Visit: Payer: Self-pay

## 2020-05-30 DIAGNOSIS — M1000 Idiopathic gout, unspecified site: Secondary | ICD-10-CM | POA: Diagnosis not present

## 2020-05-30 DIAGNOSIS — M79674 Pain in right toe(s): Secondary | ICD-10-CM

## 2020-05-30 DIAGNOSIS — M79675 Pain in left toe(s): Secondary | ICD-10-CM | POA: Diagnosis not present

## 2020-05-30 DIAGNOSIS — B351 Tinea unguium: Secondary | ICD-10-CM | POA: Diagnosis not present

## 2020-05-30 MED ORDER — TERBINAFINE HCL 250 MG PO TABS: 250.0000 mg | ORAL_TABLET | Freq: Every day | ORAL | 0 refills | Status: DC

## 2020-05-30 NOTE — Progress Notes (Signed)
Subjective:   Patient ID: Daniel Durham, male   DOB: 54 y.o.   MRN: [id number]   HPI patient presents with very thickened nailbeds 1-5 both feet that are very painful with incurvated beds noted bilateral with history of ingrown toenail removal left.  Patient has broken the left ankle recently and has probable gout bilateral.  Patient does not smoke likes to be active     Review of Systems  All other systems reviewed and are negative.       Objective:  Physical Exam Vitals and nursing note reviewed.  Constitutional:      Appearance: He is well-developed.  Pulmonary:     Effort: Pulmonary effort is normal.  Musculoskeletal:        General: Normal range of motion.  Skin:    General: Skin is warm.  Neurological:     Mental Status: He is alert.     Neurovascular status was found to be intact with patient found to have severely thickened nailbeds left over right with incurvation of the borders and pain with pressure.  Patient is found to have skin manifestations also fungus bilateral which is bothersome to him and has what appears to be ingrown toenails on the left big toe second toe.  Has good digital perfusion well oriented and has caregivers with him today     Assessment:  Significant mycotic nail infection 1-5 both feet with thickness and pain with pressure palpation     Plan:  H&P reviewed condition and recommended debridement of nailbeds 1-5 both feet with no iatrogenic bleeding discussed possible permanent procedure discussed gout and a fracture of his ankle.  He will be seen back routine care or if he decides to have permanent nail procedures done he will see [place] prior to that event

## 2020-05-31 NOTE — Telephone Encounter (Signed)
Can you look into previous referral. I cannot find which practice it was sent to. He just needs the name and number so he can call and reschedule his appt. Thank you.

## 2020-06-18 ENCOUNTER — Encounter: Payer: Self-pay | Admitting: Physical Medicine and Rehabilitation

## 2020-06-18 ENCOUNTER — Encounter: Payer: Self-pay | Admitting: Physician Assistant

## 2020-06-28 ENCOUNTER — Other Ambulatory Visit: Payer: Self-pay | Admitting: Physician Assistant

## 2020-06-28 DIAGNOSIS — F419 Anxiety disorder, unspecified: Secondary | ICD-10-CM

## 2020-06-28 DIAGNOSIS — F32A Depression, unspecified: Secondary | ICD-10-CM

## 2020-06-28 DIAGNOSIS — M5010 Cervical disc disorder with radiculopathy, unspecified cervical region: Secondary | ICD-10-CM

## 2020-06-28 DIAGNOSIS — G8929 Other chronic pain: Secondary | ICD-10-CM

## 2020-06-28 NOTE — Telephone Encounter (Signed)
Indication for chronic opioid: Chronic back pain, Cervical disc radiculopathy Medication and dose: Oxycodone-APAP 10/325 mg # pills per month: 120 on 05/29/20 Last UDS date: 12/24/18 Opioid Treatment Agreement signed (Y/N): Yes, 09/28/18 Opioid Treatment Agreement last reviewed with patient:   NCCSRS reviewed this encounter (include red flags):     Pain doctor appointment is Sept 15  Xanax last rx 05/29/20 #30 LOV: 04/23/20 Gout

## 2020-06-29 MED ORDER — OXYCODONE-ACETAMINOPHEN 10-325 MG PO TABS: ORAL_TABLET | ORAL | 0 refills | Status: DC

## 2020-06-29 MED ORDER — ALPRAZOLAM 0.25 MG PO TABS: 0.2500 mg | ORAL_TABLET | Freq: Two times a day (BID) | ORAL | 0 refills | Status: DC | PRN

## 2020-06-29 MED ORDER — CYCLOBENZAPRINE HCL 10 MG PO TABS: 10.0000 mg | ORAL_TABLET | Freq: Every day | ORAL | 0 refills | Status: DC

## 2020-07-04 ENCOUNTER — Other Ambulatory Visit: Payer: Self-pay | Admitting: Emergency Medicine

## 2020-07-04 MED ORDER — SERTRALINE HCL 50 MG PO TABS
50.0000 mg | ORAL_TABLET | Freq: Every day | ORAL | 1 refills | Status: DC
Start: 2020-07-04 — End: 2020-10-09

## 2020-07-17 ENCOUNTER — Ambulatory Visit: Payer: Medicare HMO

## 2020-07-18 ENCOUNTER — Other Ambulatory Visit: Payer: Self-pay

## 2020-07-18 ENCOUNTER — Encounter: Payer: Self-pay | Admitting: Physical Medicine and Rehabilitation

## 2020-07-18 ENCOUNTER — Encounter: Payer: Medicare HMO | Attending: Physical Medicine and Rehabilitation | Admitting: Physical Medicine and Rehabilitation

## 2020-07-18 ENCOUNTER — Encounter: Payer: Self-pay | Admitting: Physician Assistant

## 2020-07-18 VITALS — BP 163/102 | HR 82 | Temp 98.2°F | Ht 67.0 in | Wt 270.0 lb

## 2020-07-18 DIAGNOSIS — Z5181 Encounter for therapeutic drug level monitoring: Secondary | ICD-10-CM | POA: Insufficient documentation

## 2020-07-18 DIAGNOSIS — Z79891 Long term (current) use of opiate analgesic: Secondary | ICD-10-CM | POA: Diagnosis not present

## 2020-07-18 DIAGNOSIS — G894 Chronic pain syndrome: Secondary | ICD-10-CM | POA: Diagnosis not present

## 2020-07-18 NOTE — Progress Notes (Signed)
Subjective:    Patient ID: Daniel Durham, male    DOB: [birth year], 55 y.o.   MRN: [id number]  HPI Daniel Durham is a 54 yer old man who presents to establish care for left upper extremity amputation.   He is only able to walk 5 min. Uses a cane. His movement is most limited by pain.   He was hospitalized in December for [facility] for 6 weeks. After he was discharged he tripped and fracture his left ankle.   He just started trying to cut grass, do laundry, do dishes.   If he is tired he sits down and tries to get back up.   Average pain is 6/10.  He has a phenomenal wife. They love to cook together. He has gained about 25 lbs since December.   BP up to 163/102.   His Gabapentin was recently increased. He was been on the oxycodone for years. He lost his left arm in work-related accident. He was on a lot of narcotics at this time- morphine and oxycodone.   Pain Inventory Average Pain 6 Pain Right Now 8 My pain is sharp, stabbing and aching  In the last 24 hours, has pain interfered with the following? General activity 6 Relation with others 9 Enjoyment of life 7 What TIME of day is your pain at its worst? daytime and evening Sleep (in general) Fair  Pain is worse with: walking, bending, standing and some activites Pain improves with: pacing activities Relief from Meds: 9  walk with assistance use a cane ability to climb steps?  yes do you drive?  yes Do you have any goals in this area?  yes  disabled: date disabled . I need assistance with the following:  bathing and meal prep  weakness numbness tingling trouble walking anxiety  new  new    Family History  Problem Relation Age of Onset  . Diabetes Mother        Living  . Sleep apnea Mother   . Diabetes Father 54       Deceased  . Hypertension Father   . Jaundice Father   . Hemophilia Father   . Alcoholism Father   . Cancer Maternal Grandfather        Throat  . Esophageal cancer Maternal Grandfather    . Cancer Paternal Uncle        Throat  . Esophageal cancer Paternal Uncle   . Asthma Sister   . Diabetes Sister   . Healthy Son        X1  . Healthy Daughter        X1  . Colon polyps Neg Hx   . Colon cancer Neg Hx   . Rectal cancer Neg Hx   . Stomach cancer Neg Hx    Social History   Socioeconomic History  . Marital status: Divorced    Spouse name: Not on file  . Number of children: Not on file  . Years of education: Not on file  . Highest education level: Not on file  Occupational History  . Not on file  Tobacco Use  . Smoking status: Never Smoker  . Smokeless tobacco: Never Used  Vaping Use  . Vaping Use: Never used  Substance and Sexual Activity  . Alcohol use: Yes    Alcohol/week: 0.0 standard drinks    Comment: occ  . Drug use: No  . Sexual activity: Yes  Other Topics Concern  . Not on file  Social History Narrative  .  Not on file   Social Determinants of Health   Financial Resource Strain:   . Difficulty of Paying Living Expenses: Not on file  Food Insecurity:   . Worried About Charity fundraiser in the Last Year: Not on file  . Ran Out of Food in the Last Year: Not on file  Transportation Needs:   . Lack of Transportation (Medical): Not on file  . Lack of Transportation (Non-Medical): Not on file  Physical Activity:   . Days of Exercise per Week: Not on file  . Minutes of Exercise per Session: Not on file  Stress:   . Feeling of Stress : Not on file  Social Connections:   . Frequency of Communication with Friends and Family: Not on file  . Frequency of Social Gatherings with Friends and Family: Not on file  . Attends Religious Services: Not on file  . Active Member of Clubs or Organizations: Not on file  . Attends Archivist Meetings: Not on file  . Marital Status: Not on file   Past Surgical History:  Procedure Laterality Date  . BACK SURGERY    . CARDIAC CATHETERIZATION     11/05/10 [facility], [place]): [place] of mLAD,  no sign disease. EF 45% in RAO, 60% LAO (51% stress, 54% rest by NM stress; 57-59% echo 10/2010)  . CARPAL TUNNEL RELEASE     Bilateral  . CERVICAL DISCECTOMY  12/17/2017   titanium plates 12-17-17  . COLONOSCOPY    . HAND AMPUTATION  2007  . ORIF ANKLE FRACTURE Left 02/23/2020   Procedure: OPEN REDUCTION INTERNAL FIXATION (ORIF) ANKLE FRACTURE;  Surgeon: Nicholes Stairs, MD;  Location: [facility];  Service: Orthopedics;  Laterality: Left;  . POLYPECTOMY     pt states had colon in [place] and he had polyps- he has no idea what type   . ROTATOR CUFF REPAIR     Left  . SPINAL CORD STIMULATOR IMPLANT    . SPINAL CORD STIMULATOR REMOVAL N/A 10/26/2015   Procedure: LUMBAR SPINAL CORD STIMULATOR REMOVAL;  Surgeon: Clydell Hakim, MD;  Location: [facility];  Service: Neurosurgery;  Laterality: N/A;   Past Medical History:  Diagnosis Date  . Allergy   . Anxiety   . Asthma    SEASONAL   . Blood transfusion without reported diagnosis   . Chronic back pain    Neurostimulator  . Depression   . Environmental allergies   . Hypertension   . Neuromuscular disorder ([place])   . Neuropathy   . Seasonal allergic conjunctivitis    BP (!) 163/102   Pulse 82   Temp 98.2 F (36.8 C)   Ht 5\' 7"  (1.702 m)   Wt 270 lb (122.5 kg)   SpO2 94%   BMI 42.29 kg/m   Opioid Risk Score:   Fall Risk Score:  `1  Depression screen PHQ 2/9  Depression screen [facility] 2/9 07/18/2020 02/06/2020 09/21/2019 06/08/2019 03/16/2019 02/15/2019 09/09/2018  Decreased Interest 0 1 0 0 0 0 0  Down, Depressed, Hopeless 0 1 0 0 0 0 1  PHQ - 2 Score 0 2 0 0 0 0 1  Altered sleeping 2 1 0 0 - 0 1  Tired, decreased energy 0 1 0 0 - 0 0  Change in appetite 0 0 0 0 - 0 0  Feeling bad or failure about yourself  0 0 0 0 - 0 0  Trouble concentrating 1 0 0 0 - 0 0  Moving  slowly or fidgety/restless 0 0 0 0 - 0 0  Suicidal thoughts 0 0 0 0 - 0 0  PHQ-9 Score 3 4 0 0 - 0 2  Difficult doing work/chores Not difficult at all Somewhat  difficult Not difficult at all Not difficult at all - Not difficult at all Somewhat difficult  Some recent data might be hidden   Review of Systems  Constitutional: Positive for unexpected weight change.  HENT: Negative.   Eyes: Negative.   Respiratory: Positive for shortness of breath.   Cardiovascular: Negative.   Gastrointestinal: Negative.   Endocrine: Negative.   Genitourinary: Negative.   Musculoskeletal: Positive for gait problem.  Skin: Negative.   Allergic/Immunologic: Negative.   Neurological: Positive for weakness and numbness.       Tingling   Hematological: Negative.   Psychiatric/Behavioral: The patient is nervous/anxious.   All other systems reviewed and are negative.      Objective:   Physical Exam Gen: no distress, normal appearing HEENT: oral mucosa pink and moist, NCAT Cardio: Reg rate Chest: normal effort, normal rate of breathing Abd: soft, non-distended Ext: no edema Skin: intact Neuro: Alert and oriented x3.  Musculoskeletal: left arm amputation well healed Psych: pleasant, normal affect    Assessment & Plan:  Daniel Durham is a 54 year old man who presents to establish care for left sided post-amputation pain, headaches, lower back pain, neck pain.   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  -Discussed current symptoms of pain and history of pain.  -Discussed benefits of exercise in reducing pain. -Discussed following foods that may reduce pain: 1) Ginger 2) Blueberries 3) Salmon 4) Pumpkin seeds 5) dark chocolate 6) turmeric 7) tart cherries 8) virgin olive oil 9) chilli peppers 10) mint 11) red wine -Made goal to incorporate some of these foods into his diet. -Discussed [employer] PNS system as an option of pain treatment via neuromodulation. Provided following link for patient to learn more about the system: https://www.sprtherapeutics.com/.  PDMP reviewed.  Urine sample obtained today.   Impaired mobility and  ADLs: -Only able to walk about 15 minutes.   Post-COVID: -Continues to have shortness of breath.   Gout: Continue allopurinol. Advised regarding foods that can precipitate gout.   Obesity: BMI 42.29, weight is 270 lbs. Will monitor every visit.   Support: Has been together 12 years with his wife and says she is his rock. They also raise his granddaughter.   Migraines: He has headaches 15 days per month. Tylenol was helpful.    All questions answered. RTC in 4 weeks.

## 2020-07-18 NOTE — Patient Instructions (Addendum)
1) Ginger 2) Blueberries 3) Salmon 4) Pumpkin seeds 5) dark chocolate 6) turmeric 7) tart cherries 8) virgin olive oil 9) chilli peppers 10) mint 11) red wine   https://www.[employer].com/.

## 2020-07-24 LAB — DRUG TOX MONITOR 1 W/CONF, ORAL FLD
Amphetamines: NEGATIVE ng/mL (ref <10)
Barbiturates: NEGATIVE ng/mL (ref <10)
Benzodiazepines: NEGATIVE ng/mL (ref <0.50)
Buprenorphine: NEGATIVE ng/mL (ref <0.10)
Cocaine: NEGATIVE ng/mL (ref <5.0)
Codeine: NEGATIVE ng/mL (ref <2.5)
Dihydrocodeine: NEGATIVE ng/mL (ref <2.5)
Fentanyl: NEGATIVE ng/mL (ref <0.10)
Heroin Metabolite: NEGATIVE ng/mL (ref <1.0)
Hydrocodone: NEGATIVE ng/mL (ref <2.5)
Hydromorphone: NEGATIVE ng/mL (ref <2.5)
MARIJUANA: NEGATIVE ng/mL (ref <2.5)
MDMA: NEGATIVE ng/mL (ref <10)
Meprobamate: NEGATIVE ng/mL (ref <2.5)
Methadone: NEGATIVE ng/mL (ref <5.0)
Morphine: NEGATIVE ng/mL (ref <2.5)
Nicotine Metabolite: NEGATIVE ng/mL (ref <5.0)
Norhydrocodone: NEGATIVE ng/mL (ref <2.5)
Noroxycodone: NEGATIVE ng/mL (ref <2.5)
Opiates: POSITIVE ng/mL — AB (ref <2.5)
Oxycodone: 11.4 ng/mL — ABNORMAL HIGH (ref <2.5)
Oxymorphone: NEGATIVE ng/mL (ref <2.5)
Phencyclidine: NEGATIVE ng/mL (ref <10)
Tapentadol: NEGATIVE ng/mL (ref <5.0)
Tramadol: NEGATIVE ng/mL (ref <5.0)
Zolpidem: NEGATIVE ng/mL (ref <5.0)

## 2020-07-24 LAB — DRUG TOX ALC METAB W/CON, ORAL FLD: Alcohol Metabolite: NEGATIVE ng/mL (ref <25)

## 2020-07-26 ENCOUNTER — Telehealth: Payer: Self-pay | Admitting: *Deleted

## 2020-07-26 NOTE — Telephone Encounter (Signed)
Oral swab drug screen was consistent for prescribed medications.  ?

## 2020-07-27 ENCOUNTER — Other Ambulatory Visit: Payer: Self-pay | Admitting: Physician Assistant

## 2020-07-27 DIAGNOSIS — G8929 Other chronic pain: Secondary | ICD-10-CM

## 2020-07-27 DIAGNOSIS — F32A Depression, unspecified: Secondary | ICD-10-CM

## 2020-07-27 DIAGNOSIS — M5010 Cervical disc disorder with radiculopathy, unspecified cervical region: Secondary | ICD-10-CM

## 2020-07-27 DIAGNOSIS — M549 Dorsalgia, unspecified: Secondary | ICD-10-CM

## 2020-07-27 NOTE — Telephone Encounter (Signed)
Oxycodone LFD 06/29/20 #120 with no refills Alprazolam LFD 06/29/20 #30 with no refills LOV 04/23/20 NOV none

## 2020-07-29 ENCOUNTER — Other Ambulatory Visit: Payer: Self-pay | Admitting: Podiatry

## 2020-07-29 ENCOUNTER — Other Ambulatory Visit: Payer: Self-pay | Admitting: Physician Assistant

## 2020-07-29 DIAGNOSIS — F419 Anxiety disorder, unspecified: Secondary | ICD-10-CM

## 2020-07-29 DIAGNOSIS — M5010 Cervical disc disorder with radiculopathy, unspecified cervical region: Secondary | ICD-10-CM

## 2020-07-29 DIAGNOSIS — F32A Depression, unspecified: Secondary | ICD-10-CM

## 2020-07-29 DIAGNOSIS — G8929 Other chronic pain: Secondary | ICD-10-CM

## 2020-07-29 DIAGNOSIS — M549 Dorsalgia, unspecified: Secondary | ICD-10-CM

## 2020-07-29 NOTE — Telephone Encounter (Signed)
Please Advise

## 2020-07-30 ENCOUNTER — Other Ambulatory Visit: Payer: Self-pay | Admitting: Physician Assistant

## 2020-07-30 ENCOUNTER — Telehealth: Payer: Self-pay | Admitting: Physician Assistant

## 2020-07-30 ENCOUNTER — Other Ambulatory Visit: Payer: Self-pay | Admitting: Podiatry

## 2020-07-30 DIAGNOSIS — G8929 Other chronic pain: Secondary | ICD-10-CM

## 2020-07-30 DIAGNOSIS — M5010 Cervical disc disorder with radiculopathy, unspecified cervical region: Secondary | ICD-10-CM

## 2020-07-30 DIAGNOSIS — M549 Dorsalgia, unspecified: Secondary | ICD-10-CM

## 2020-07-30 DIAGNOSIS — F419 Anxiety disorder, unspecified: Secondary | ICD-10-CM

## 2020-07-30 DIAGNOSIS — F32A Depression, unspecified: Secondary | ICD-10-CM

## 2020-07-30 MED ORDER — OXYCODONE-ACETAMINOPHEN 10-325 MG PO TABS: ORAL_TABLET | ORAL | 0 refills | Status: DC

## 2020-07-30 MED ORDER — ALPRAZOLAM 0.25 MG PO TABS: 0.2500 mg | ORAL_TABLET | Freq: Two times a day (BID) | ORAL | 0 refills | Status: DC | PRN

## 2020-07-30 NOTE — Telephone Encounter (Signed)
Last OV 04/23/20 Oxycodone last filled 06/29/20 #120 with 0

## 2020-07-30 NOTE — Telephone Encounter (Signed)
We don't refill lamisil

## 2020-07-30 NOTE — Telephone Encounter (Signed)
Last OV 04/23/20 Alprazolam last filled 06/29/20 #30 with 0

## 2020-07-30 NOTE — Telephone Encounter (Signed)
Attempted to schedule AWV. Unable to LVM.  Will try at later time.  

## 2020-07-30 NOTE — Telephone Encounter (Signed)
Please Advise

## 2020-08-01 ENCOUNTER — Telehealth: Payer: Self-pay | Admitting: Physician Assistant

## 2020-08-01 NOTE — Chronic Care Management (AMB) (Signed)
  Chronic Care Management   Note  08/01/2020 Name: Daniel Durham MRN: [id number] DOB: May 28, 1966  Daniel Durham is a 54 y.o. year old male who is a primary care patient of Delorse Limber. I reached out to [employer] by phone today in response to a referral sent by Daniel Durham's PCP, Brunetta Jeans, PA-C.   Daniel Durham was given information about Chronic Care Management services today including:  1. CCM service includes personalized support from designated clinical staff supervised by his physician, including individualized plan of care and coordination with other care providers 2. 24/7 contact phone numbers for assistance for urgent and routine care needs. 3. Service will only be billed when office clinical staff spend 20 minutes or more in a month to coordinate care. 4. Only one practitioner may furnish and bill the service in a calendar month. 5. The patient may stop CCM services at any time (effective at the end of the month) by phone call to the office staff.   Patient agreed to services and verbal consent obtained.   Follow up plan:   Lauretta Grill [employer] Scheduler

## 2020-08-01 NOTE — Telephone Encounter (Signed)
Do not refill.  

## 2020-08-15 ENCOUNTER — Emergency Department (HOSPITAL_COMMUNITY): Payer: Medicare HMO

## 2020-08-15 ENCOUNTER — Emergency Department (HOSPITAL_COMMUNITY)
Admission: EM | Admit: 2020-08-15 | Discharge: 2020-08-15 | Disposition: A | Discharge status: Home / Self Care | Payer: Medicare HMO | Attending: Emergency Medicine | Admitting: Emergency Medicine

## 2020-08-15 DIAGNOSIS — R079 Chest pain, unspecified: Secondary | ICD-10-CM | POA: Diagnosis not present

## 2020-08-15 DIAGNOSIS — R202 Paresthesia of skin: Secondary | ICD-10-CM | POA: Diagnosis not present

## 2020-08-15 DIAGNOSIS — Z20822 Contact with and (suspected) exposure to covid-19: Secondary | ICD-10-CM | POA: Insufficient documentation

## 2020-08-15 DIAGNOSIS — M542 Cervicalgia: Secondary | ICD-10-CM | POA: Insufficient documentation

## 2020-08-15 DIAGNOSIS — R52 Pain, unspecified: Secondary | ICD-10-CM | POA: Diagnosis not present

## 2020-08-15 DIAGNOSIS — R064 Hyperventilation: Secondary | ICD-10-CM | POA: Insufficient documentation

## 2020-08-15 DIAGNOSIS — R0789 Other chest pain: Secondary | ICD-10-CM | POA: Diagnosis not present

## 2020-08-15 DIAGNOSIS — R0600 Dyspnea, unspecified: Secondary | ICD-10-CM | POA: Diagnosis not present

## 2020-08-15 DIAGNOSIS — J45909 Unspecified asthma, uncomplicated: Secondary | ICD-10-CM | POA: Diagnosis not present

## 2020-08-15 DIAGNOSIS — Z79899 Other long term (current) drug therapy: Secondary | ICD-10-CM | POA: Diagnosis not present

## 2020-08-15 DIAGNOSIS — I10 Essential (primary) hypertension: Secondary | ICD-10-CM | POA: Diagnosis not present

## 2020-08-15 DIAGNOSIS — R109 Unspecified abdominal pain: Secondary | ICD-10-CM | POA: Diagnosis not present

## 2020-08-15 DIAGNOSIS — I517 Cardiomegaly: Secondary | ICD-10-CM | POA: Diagnosis not present

## 2020-08-15 DIAGNOSIS — R0602 Shortness of breath: Secondary | ICD-10-CM | POA: Diagnosis not present

## 2020-08-15 LAB — TROPONIN I (HIGH SENSITIVITY)
Troponin I (High Sensitivity): 8 ng/L (ref <18)
Troponin I (High Sensitivity): 9 ng/L (ref <18)

## 2020-08-15 LAB — BASIC METABOLIC PANEL
Anion gap: 12 (ref 5–15)
BUN: 9 mg/dL (ref 6–20)
CO2: 21 mmol/L — ABNORMAL LOW (ref 22–32)
Calcium: 8.7 mg/dL — ABNORMAL LOW (ref 8.9–10.3)
Chloride: 103 mmol/L (ref 98–111)
Creatinine, Ser: 1.22 mg/dL (ref 0.61–1.24)
GFR, Estimated: >60 mL/min (ref >60)
Glucose, Bld: 104 mg/dL — ABNORMAL HIGH (ref 70–99)
Potassium: 6.2 mmol/L — ABNORMAL HIGH (ref 3.5–5.1)
Sodium: 136 mmol/L (ref 135–145)

## 2020-08-15 LAB — CBC
HCT: 45.9 % (ref 39.0–52.0)
Hemoglobin: 15.7 g/dL (ref 13.0–17.0)
MCH: 33.7 pg (ref 26.0–34.0)
MCHC: 34.2 g/dL (ref 30.0–36.0)
MCV: 98.5 fL (ref 80.0–100.0)
Platelets: 185 10*3/uL (ref 150–400)
RBC: 4.66 MIL/uL (ref 4.22–5.81)
RDW: 12.9 % (ref 11.5–15.5)
WBC: 7.8 10*3/uL (ref 4.0–10.5)
nRBC: 0 % (ref 0.0–0.2)

## 2020-08-15 LAB — RESP PANEL BY RT PCR (RSV, FLU A&B, COVID)
Influenza A by PCR: NEGATIVE
Influenza B by PCR: NEGATIVE
Respiratory Syncytial Virus by PCR: NEGATIVE
SARS Coronavirus 2 by RT PCR: NEGATIVE

## 2020-08-15 LAB — I-STAT CHEM 8, ED
BUN: 8 mg/dL (ref 6–20)
Calcium, Ion: 1.09 mmol/L — ABNORMAL LOW (ref 1.15–1.40)
Chloride: 104 mmol/L (ref 98–111)
Creatinine, Ser: 1 mg/dL (ref 0.61–1.24)
Glucose, Bld: 96 mg/dL (ref 70–99)
HCT: 45 % (ref 39.0–52.0)
Hemoglobin: 15.3 g/dL (ref 13.0–17.0)
Potassium: 4 mmol/L (ref 3.5–5.1)
Sodium: 138 mmol/L (ref 135–145)
TCO2: 24 mmol/L (ref 22–32)

## 2020-08-15 LAB — I-STAT CREATININE, ED: Creatinine, Ser: 1.1 mg/dL (ref 0.61–1.24)

## 2020-08-15 MED ORDER — LORAZEPAM 2 MG/ML IJ SOLN
0.5000 mg | Freq: Once | INTRAMUSCULAR | Status: AC
  Administered 2020-08-15: 0.5 mg via INTRAVENOUS
  Filled 2020-08-15: qty 1

## 2020-08-15 MED ORDER — IOHEXOL 350 MG/ML SOLN
75.0000 mL | Freq: Once | INTRAVENOUS | Status: AC | PRN
  Administered 2020-08-15: 75 mL via INTRAVENOUS

## 2020-08-15 NOTE — ED Notes (Signed)
Pt d/c home per MD order. Discharge summary reviewed, pt verbalizes understanding. Off unit via WC- Pt d/c home with his spouse.

## 2020-08-15 NOTE — ED Provider Notes (Signed)
[facility] Provider Note   CSN: [id number] Arrival date & time: 08/15/20  1157     History Chief Complaint  Patient presents with  . Chest Pain  . Flank Pain  . Shortness of Breath    [facility] is a 54 y.o. male who presents with cc of chest pain. The patient is admittedly very anxious. He c/o sharp anterior chest pain on the left that radiates up to his left neck. The pain has been constant for 3 days.  He states that the pain is worse when he moves or takes a deep breath.  He states that he was able to build a fire was out on his swing began feeling like he was very short of breath.  He was having trouble taking a deep breath because of the pain.  He denies any nausea, vomiting or diaphoresis.  He denies any recent heavy lifting.  He has had some numbness in his right hand.  He has a history of hypertension and a father who had heart attack prior to the age of 9.  He does not smoke, he has no history of hyperlipidemia for acute coronary syndrome.  He states that he has gained a lot of weight this year because he got Covid and was hospitalized for a long time about last December and then broke his ankle.  He denies urinary symptoms or abdominal pain.     HPI: A 54 year old patient with a history of hypertension and obesity presents for evaluation of chest pain. Initial onset of pain was less than one hour ago. The patient's chest pain is sharp and is not worse with exertion. The patient's chest pain is not middle- or left-sided, is not well-localized, is not described as heaviness/pressure/tightness and does radiate to the arms/jaw/neck. The patient does not complain of nausea and denies diaphoresis. The patient has a family history of coronary artery disease in a first-degree relative with onset less than age 66. The patient has no history of stroke, has no history of peripheral artery disease, has not smoked in the past 90 days, denies any  history of treated diabetes and has no history of hypercholesterolemia.   Past Medical History:  Diagnosis Date  . Allergy   . Anxiety   . Asthma    SEASONAL   . Blood transfusion without reported diagnosis   . Chronic back pain    Neurostimulator  . Depression   . Environmental allergies   . Hypertension   . Neuromuscular disorder ([place])   . Neuropathy   . Seasonal allergic conjunctivitis     Patient Active Problem List   Diagnosis Date Noted  . Pain of toe of left foot 04/16/2020  . Closed fracture of lateral malleolus 03/08/2020  . Closed left ankle fracture 02/16/2020  . Ankle fracture, bimalleolar, closed, left, initial encounter 02/15/2020  . Morbid obesity ([place]) 12/27/2019  . HTN (hypertension) 11/18/2019  . Colon cancer screening 05/25/2018  . Anxiety and depression 03/03/2017  . Cervical disc disorder with radiculopathy of cervical region 07/24/2015  . 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 Visit for preventive health examination 02/04/2015  . Chronic back pain greater than 3 months duration 01/23/2015  . 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 Amputation stump complication ([place]) 95/63/8756    Past Surgical History:  Procedure Laterality Date  . BACK SURGERY    . CARDIAC CATHETERIZATION  11/05/10 [facility], [place]): [place] of mLAD, no sign disease. EF 45% in RAO, 60% LAO (51% stress, 54% rest by NM stress; 57-59% echo 10/2010)  . CARPAL TUNNEL RELEASE     Bilateral  . CERVICAL DISCECTOMY  12/17/2017   titanium plates 12-17-17  . COLONOSCOPY    . HAND AMPUTATION  2007  . ORIF ANKLE FRACTURE Left 02/23/2020   Procedure: OPEN REDUCTION INTERNAL FIXATION (ORIF) ANKLE FRACTURE;  Surgeon: Nicholes Stairs, MD;  Location: [facility];  Service: Orthopedics;  Laterality: Left;  . POLYPECTOMY     pt states had colon in [place] and he had polyps- he  has no idea what type   . ROTATOR CUFF REPAIR     Left  . SPINAL CORD STIMULATOR IMPLANT    . SPINAL CORD STIMULATOR REMOVAL N/A 10/26/2015   Procedure: LUMBAR SPINAL CORD STIMULATOR REMOVAL;  Surgeon: Clydell Hakim, MD;  Location: [facility];  Service: Neurosurgery;  Laterality: N/A;       Family History  Problem Relation Age of Onset  . Diabetes Mother        Living  . Sleep apnea Mother   . Diabetes Father 38       Deceased  . Hypertension Father   . Jaundice Father   . Hemophilia Father   . Alcoholism Father   . Cancer Maternal Grandfather        Throat  . Esophageal cancer Maternal Grandfather   . Cancer Paternal Uncle        Throat  . Esophageal cancer Paternal Uncle   . Asthma Sister   . Diabetes Sister   . Healthy Son        X1  . Healthy Daughter        X1  . Colon polyps Neg Hx   . Colon cancer Neg Hx   . Rectal cancer Neg Hx   . Stomach cancer Neg Hx     Social History   Tobacco Use  . Smoking status: Never Smoker  . Smokeless tobacco: Never Used  Vaping Use  . Vaping Use: Never used  Substance Use Topics  . Alcohol use: Yes    Alcohol/week: 0.0 standard drinks    Comment: occ  . Drug use: No    Home Medications Prior to Admission medications   Medication Sig Start Date End Date Taking? Authorizing Provider  allopurinol (ZYLOPRIM) 100 MG tablet Take 1 tablet (100 mg total) by mouth daily. 04/24/20  Yes Brunetta Jeans, PA-C  ALPRAZolam Duanne Moron) 0.25 MG tablet Take 1 tablet (0.25 mg total) by mouth 2 (two) times daily as needed for anxiety. 07/30/20  Yes Midge Minium, MD  atorvastatin (LIPITOR) 10 MG tablet TAKE 1 TABLET(10 MG) BY MOUTH DAILY Patient taking differently: Take 10 mg by mouth daily.  09/23/19  Yes Brunetta Jeans, PA-C  cyclobenzaprine (FLEXERIL) 10 MG tablet Take 1 tablet (10 mg total) by mouth at bedtime. Patient taking differently: Take 10 mg by mouth at bedtime as needed for muscle spasms.  06/29/20  Yes Brunetta Jeans, PA-C  gabapentin (NEURONTIN) 300 MG capsule Take 1 capsule each morning and midday along with your 600 mg dose to make a total of 900 mg Patient taking differently: Take 300 mg by mouth daily. Taking daily at noon along with 600mg  = 900mg  02/06/20  Yes Brunetta Jeans, PA-C  gabapentin (NEURONTIN) 600 MG tablet Take 1 tablet (600 mg total) by mouth 3 (three) times daily. 02/06/20  Yes Brunetta Jeans, PA-C  ipratropium-albuterol (DUONEB) 0.5-2.5 (3) MG/3ML SOLN Take 3 mLs by nebulization 3 (three) times daily. Patient taking differently: Take 3 mLs by nebulization every 8 (eight) hours as needed (shortness of breath, wheezing).  11/21/19  Yes Vann, Jessica U, DO  lisinopril (ZESTRIL) 20 MG tablet TAKE 1 TABLET(20 MG) BY MOUTH DAILY Patient taking differently: Take 20 mg by mouth daily. TAKE 1 TABLET(20 MG) BY MOUTH DAILY 03/02/20  Yes Brunetta Jeans, PA-C  montelukast (SINGULAIR) 10 MG tablet Take 1 tablet (10 mg total) by mouth daily. 04/06/20  Yes Brunetta Jeans, PA-C  oxyCODONE-acetaminophen (PERCOCET) 10-325 MG tablet TK 1 T PO Q 6 H PRN Patient taking differently: Take 1 tablet by mouth every 6 (six) hours as needed for pain. TK 1 T PO Q 6 H PRN 07/30/20  Yes Midge Minium, MD  sertraline (ZOLOFT) 50 MG tablet Take 1 tablet (50 mg total) by mouth daily. Patient taking differently: Take 50 mg by mouth at bedtime.  07/04/20  Yes Brunetta Jeans, PA-C  tiZANidine (ZANAFLEX) 2 MG tablet Take 2 mg by mouth daily as needed for muscle spasms.    Yes [provider]  terbinafine (LAMISIL) 250 MG tablet Take 1 tablet (250 mg total) by mouth daily. Patient not taking: Reported on 08/15/2020 05/30/20   Wallene Huh, DPM    Allergies    Aspirin and Hydrocodone  Review of Systems   Review of Systems Ten systems reviewed and are negative for acute change, except as noted in the HPI.   Physical Exam Updated Vital Signs BP 140/85   Pulse 88   Temp 97.9 F (36.6 C) (Oral)    Resp 17   Ht 5\' 7"  (1.702 m)   Wt 122.5 kg   SpO2 96%   BMI 42.29 kg/m   Physical Exam Vitals and nursing note reviewed.  Constitutional:      General: He is not in acute distress.    Appearance: He is well-developed. He is not diaphoretic.  HENT:     Head: Normocephalic and atraumatic.  Eyes:     General: No scleral icterus.    Conjunctiva/sclera: Conjunctivae normal.  Cardiovascular:     Rate and Rhythm: Normal rate and regular rhythm.     Heart sounds: Normal heart sounds.  Pulmonary:     Effort: Pulmonary effort is normal. No respiratory distress.     Breath sounds: Normal breath sounds.  Chest:     Chest wall: Tenderness present.       Comments: Reproducible chest wall pain in the areas of complain.  Abdominal:     Palpations: Abdomen is soft.     Tenderness: There is no abdominal tenderness. There is no right CVA tenderness or left CVA tenderness.  Musculoskeletal:     Cervical back: Normal range of motion and neck supple.  Skin:    General: Skin is warm and dry.  Neurological:     Mental Status: He is alert.  Psychiatric:        Behavior: Behavior normal.      ED Results / Procedures / Treatments   Labs (all labs ordered are listed, but only abnormal results are displayed) Labs Reviewed  BASIC METABOLIC PANEL - Abnormal; Notable for the following components:      Result Value   Potassium 6.2 (*)    CO2 21 (*)    Glucose, Bld 104 (*)    Calcium 8.7 (*)    All  other components within normal limits  I-STAT CHEM 8, ED - Abnormal; Notable for the following components:   Calcium, Ion 1.09 (*)    All other components within normal limits  RESP PANEL BY RT PCR (RSV, FLU A&B, COVID)  CBC  I-STAT CREATININE, ED  TROPONIN I (HIGH SENSITIVITY)  TROPONIN I (HIGH SENSITIVITY)    EKG None   ED ECG REPORT   Date: 08/16/2020  Rate: 81  Rhythm: normal sinus rhythm  QRS Axis: right  Intervals: normal  ST/T Wave abnormalities: normal  Conduction  Disutrbances:none  Narrative Interpretation:    <ECG> Radiology CT Angio Chest PE W and/or Wo Contrast  Result Date: 08/15/2020 CLINICAL DATA:  Shortness of breath EXAM: CT ANGIOGRAPHY CHEST WITH CONTRAST TECHNIQUE: Multidetector CT imaging of the chest was performed using the standard protocol during bolus administration of intravenous contrast. Multiplanar CT image reconstructions and MIPs were obtained to evaluate the vascular anatomy. CONTRAST:  31mL OMNIPAQUE IOHEXOL 350 MG/ML SOLN COMPARISON:  CT angiogram chest November 17, 2019; chest radiograph August 15, 2020 FINDINGS: Cardiovascular: There is no demonstrable pulmonary embolus. There is no thoracic aortic aneurysm or dissection. Visualized great vessels appear normal. No evident pericardial effusion or pericardial thickening. Mediastinum/Nodes: Visualized thyroid appears normal. No evident thoracic adenopathy. No esophageal lesions are evident. Lungs/Pleura: There are scattered areas of mild atelectatic change. There are a few areas showing minimal ground-glass type opacity which may represent mild residua of previous known COVID 19 pneumonitis. There is much less opacity compared to the previous CT from January 2021. There is no appreciable pleural effusion. There is no edema or consolidation. Upper Abdomen: There is a degree of fatty infiltration in the pancreas. There is hepatic steatosis. Visualized upper abdominal structures otherwise appear unremarkable. Musculoskeletal: There are no blastic or lytic bone lesions. No chest wall lesions are evident. Review of the MIP images confirms the above findings. IMPRESSION: 1. No evident pulmonary embolus. No thoracic aortic aneurysm or dissection. 2. Scattered areas of mild atelectatic change. There are a few rather minimal ground-glass type opacities which likely represent residua from previous COVID 19 pneumonitis. No consolidation or pleural effusions evident. 3.  No evident adenopathy. 4.  Hepatic  steatosis. Electronically Signed   By: Lowella Grip III M.D.   On: 08/15/2020 14:44   DG Chest Port 1 View  Result Date: 08/15/2020 CLINICAL DATA:  Dyspnea EXAM: PORTABLE CHEST 1 VIEW COMPARISON:  02/06/2020 chest radiograph. FINDINGS: Stable cardiomediastinal silhouette with mild cardiomegaly. No pneumothorax. No pleural effusion. Lungs appear clear, with no acute consolidative airspace disease and no pulmonary edema. IMPRESSION: Stable mild cardiomegaly without pulmonary edema. No active pulmonary disease. Electronically Signed   By: Ilona Sorrel M.D.   On: 08/15/2020 13:02    Procedures Procedures (including critical care time)  Medications Ordered in ED Medications  LORazepam (ATIVAN) injection 0.5 mg (0.5 mg Intravenous Given 08/15/20 1306)  iohexol (OMNIPAQUE) 350 MG/ML injection 75 mL (75 mLs Intravenous Contrast Given 08/15/20 1414)    ED Course  I have reviewed the triage vital signs and the nursing notes.  Pertinent labs & imaging results that were available during my care of the patient were reviewed by me and considered in my medical decision making (see chart for details).    MDM Rules/Calculators/A&P HEAR Score: 3                        Given the large differential diagnosis for Daniel [facility], the decision making in  this case is of high complexity.  I ordered and reviewed labs which include CBC which is without abnormality, BMP initially with elevated potassium however this appears to be secondary to hemolysis, repeat Chem-8 shows a potassium of 4.0.  Troponin is negative, negative respiratory panel.  I ordered and reviewed images including a portable 1 view chest x-ray and CT angiogram of the chest. There are no acute abnormalities on either image.   After evaluating all of the data points in this case, the presentation of [facility] is NOT consistent with Acute Coronary Syndrome (ACS) and/or myocardial ischemia, pulmonary embolism, aortic dissection;  [facility], significant arrythmia, pneumothorax, cardiac tamponade, or other emergent cardiopulmonary condition.  Further, the presentation of [facility] is NOT consistent with pericarditis, myocarditis, cholecystitis, pancreatitis, mediastinitis, endocarditis, new valvular disease.  Additionally, the presentation of [facility] NOT consistent with flail chest, cardiac contusion, ARDS, or significant intra-thoracic or intra-abdominal bleeding.  Moreover, this presentation is NOT consistent with pneumonia, sepsis, or pyelonephritis.  The patient appears to have Chest wall pain.   Strict return and follow-up precautions have been given by me personally or by detailed written instruction given verbally by nursing staff using the teach back method to the patient/family/caregiver(s).  Data Reviewed/Counseling: I have reviewed the patient's vital signs, nursing notes, and other relevant tests/information. I had a detailed discussion regarding the historical points, exam findings, and any diagnostic results supporting the discharge diagnosis. I also discussed the need for outpatient follow-up and the need to return to the ED if symptoms worsen or if there are any questions or concerns that arise at home.   Eriel Doyon was evaluated in [facility] on 08/16/2020 for the symptoms described in the history of present illness. He was evaluated in the context of the global COVID-19 pandemic, which necessitated consideration that the patient might be at risk for infection with the SARS-CoV-2 virus that causes COVID-19. Institutional protocols and algorithms that pertain to the evaluation of patients at risk for COVID-19 are in a state of rapid change based on information released by regulatory bodies including the CDC and federal and state organizations. These policies and algorithms were followed during the patient's care in the ED.  Final Clinical Impression(s) / ED Diagnoses Final  diagnoses:  Chest wall pain  Hyperventilation    Rx / DC Orders ED Discharge Orders    None       Margarita Mail, PA-C 08/16/20 0454    [facility], MD 08/16/20 [phone]

## 2020-08-15 NOTE — ED Notes (Signed)
Pt transported to CT ?

## 2020-08-15 NOTE — ED Notes (Signed)
Patient wanted provider to know that he takes his oxy, ativan, and gabapentin usually about noon and if he will be here for while he wants to know if he can receive it so he remains on his regimen.

## 2020-08-15 NOTE — Discharge Instructions (Addendum)
You have been diagnosed by your caregiver as having chest wall pain. °SEEK IMMEDIATE MEDICAL ATTENTION IF: °You develop a fever.  °Your chest pains become severe or intolerable.  °You develop new, unexplained symptoms (problems).  °You develop shortness of breath, nausea, vomiting, sweating or feel light headed.  °You develop a new cough or you cough up blood. ° °

## 2020-08-15 NOTE — ED Triage Notes (Addendum)
Pt to ED via EMS from home c/o chest pain, flank pain, SHOB since Monday. Intermittent chest pain. #18 LAC-  1 nitro given by EMS , provided little relief.  No cardiac history. Medical hx : anxiety, PTSD,HTN Last VS: [id number], hr 90, 96%RA, 98.3 temp. Reports had COVID in December 2020

## 2020-08-23 ENCOUNTER — Other Ambulatory Visit: Payer: Self-pay | Admitting: Physician Assistant

## 2020-08-29 ENCOUNTER — Other Ambulatory Visit: Payer: Self-pay | Admitting: Family Medicine

## 2020-08-29 ENCOUNTER — Ambulatory Visit: Payer: Medicare HMO | Admitting: Podiatry

## 2020-08-29 ENCOUNTER — Other Ambulatory Visit: Payer: Self-pay

## 2020-08-29 ENCOUNTER — Encounter: Payer: Self-pay | Admitting: Physical Medicine and Rehabilitation

## 2020-08-29 ENCOUNTER — Encounter: Payer: Medicare HMO | Attending: Physical Medicine and Rehabilitation | Admitting: Physical Medicine and Rehabilitation

## 2020-08-29 VITALS — BP 105/79 | HR 80 | Temp 98.2°F | Ht 67.0 in | Wt 267.8 lb

## 2020-08-29 DIAGNOSIS — S8265XS Nondisplaced fracture of lateral malleolus of left fibula, sequela: Secondary | ICD-10-CM | POA: Insufficient documentation

## 2020-08-29 DIAGNOSIS — M47816 Spondylosis without myelopathy or radiculopathy, lumbar region: Secondary | ICD-10-CM | POA: Diagnosis not present

## 2020-08-29 DIAGNOSIS — S8265XD Nondisplaced fracture of lateral malleolus of left fibula, subsequent encounter for closed fracture with routine healing: Secondary | ICD-10-CM

## 2020-08-29 DIAGNOSIS — M5010 Cervical disc disorder with radiculopathy, unspecified cervical region: Secondary | ICD-10-CM | POA: Insufficient documentation

## 2020-08-29 DIAGNOSIS — M4807 Spinal stenosis, lumbosacral region: Secondary | ICD-10-CM | POA: Diagnosis not present

## 2020-08-29 DIAGNOSIS — F419 Anxiety disorder, unspecified: Secondary | ICD-10-CM

## 2020-08-29 DIAGNOSIS — Z89212 Acquired absence of left upper limb below elbow: Secondary | ICD-10-CM

## 2020-08-29 DIAGNOSIS — G8929 Other chronic pain: Secondary | ICD-10-CM | POA: Insufficient documentation

## 2020-08-29 DIAGNOSIS — F32A Depression, unspecified: Secondary | ICD-10-CM

## 2020-08-29 DIAGNOSIS — M549 Dorsalgia, unspecified: Secondary | ICD-10-CM | POA: Insufficient documentation

## 2020-08-29 MED ORDER — OXYCODONE-ACETAMINOPHEN 10-325 MG PO TABS: ORAL_TABLET | ORAL | 0 refills | Status: DC

## 2020-08-29 MED ORDER — ALPRAZOLAM 0.25 MG PO TABS: 0.2500 mg | ORAL_TABLET | Freq: Two times a day (BID) | ORAL | 0 refills | Status: DC | PRN

## 2020-08-29 NOTE — Telephone Encounter (Signed)
Last OV 04/23/20 Alprazolam last filled 07/30/20 #30 with 0

## 2020-08-29 NOTE — Progress Notes (Signed)
Subjective:    Patient ID: Daniel Durham, male    DOB: 1966/04/19, 54 y.o.   MRN: [id number]  HPI  Mr. 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   Average pain is 4, pain right now is 6, pain feels sharp, tingling, and aching and is worst in left foot.   He has weaned off morphine and oxycodone to lower dose of 4 tabs of 10mg  Percocet prn during the day.   BP has been high and he had an episode of chest pain and shortness of breath since last visit. He called EMS and was taken to the ED and discharged after 4-5 hours; hospital records reviewed. BP is well controlled in office today.  Memory continues to be impaired following COVID.   Pain Inventory Average Pain 4 Pain Right Now 6 My pain is sharp, tingling and aching  In the last 24 hours, has pain interfered with the following? General activity 2 Relation with others 5 Enjoyment of life 6 What TIME of day is your pain at its worst? daytime and evening Sleep (in general) Poor  Pain is worse with: walking, bending, standing and some activites Pain improves with: medication Relief from Meds: 8  Family History  Problem Relation Age of Onset  . Diabetes Mother        Living  . Sleep apnea Mother   . Diabetes Father 36       Deceased  . Hypertension Father   . Jaundice Father   . Hemophilia Father   . Alcoholism Father   . Cancer Maternal Grandfather        Throat  . Esophageal cancer Maternal Grandfather   . Cancer Paternal Uncle        Throat  . Esophageal cancer Paternal Uncle   . Asthma Sister   . Diabetes Sister   . Healthy Son        X1  . Healthy Daughter        X1  . Colon polyps Neg Hx   . Colon cancer Neg Hx   . Rectal cancer Neg Hx   . Stomach cancer Neg Hx    Social History   Socioeconomic History  . Marital status: Divorced    Spouse name: Not on file  . Number of children: Not on file  . Years of education: Not on file    . Highest education level: Not on file  Occupational History  . Not on file  Tobacco Use  . Smoking status: Never Smoker  . Smokeless tobacco: Never Used  Vaping Use  . Vaping Use: Never used  Substance and Sexual Activity  . Alcohol use: Yes    Alcohol/week: 0.0 standard drinks    Comment: occ  . Drug use: No  . Sexual activity: Yes  Other Topics Concern  . Not on file  Social History Narrative  . Not on file   Social Determinants of Health   Financial Resource Strain:   . Difficulty of Paying Living Expenses: Not on file  Food Insecurity:   . Worried About Charity fundraiser in the Last Year: Not on file  . Ran Out of Food in the Last Year: Not on file  Transportation Needs:   . Lack of Transportation (Medical): Not on file  . Lack of Transportation (Non-Medical): Not on file  Physical Activity:   . Days of Exercise per Week: Not on file  .  Minutes of Exercise per Session: Not on file  Stress:   . Feeling of Stress : Not on file  Social Connections:   . Frequency of Communication with Friends and Family: Not on file  . Frequency of Social Gatherings with Friends and Family: Not on file  . Attends Religious Services: Not on file  . Active Member of Clubs or Organizations: Not on file  . Attends Archivist Meetings: Not on file  . Marital Status: Not on file   Past Surgical History:  Procedure Laterality Date  . BACK SURGERY    . 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 EF 45% in RAO, 60% LAO (51% stress, 54% rest by NM stress; 57-59% echo 10/2010)  . CARPAL TUNNEL RELEASE     Bilateral  . CERVICAL DISCECTOMY  12/17/2017   titanium plates 12-17-17  . COLONOSCOPY    . HAND AMPUTATION  2007  . ORIF ANKLE FRACTURE Left 02/23/2020   Procedure: OPEN REDUCTION INTERNAL FIXATION (ORIF) ANKLE FRACTURE;  Surgeon: Nicholes Stairs, MD;  Location: [place];  Service: Orthopedics;  Laterality: Left;  .  POLYPECTOMY     pt states had colon in [place] and he had polyps- he has no idea what type   . ROTATOR CUFF REPAIR     Left  . SPINAL CORD STIMULATOR IMPLANT    . SPINAL CORD STIMULATOR REMOVAL N/A 10/26/2015   Procedure: LUMBAR SPINAL CORD STIMULATOR REMOVAL;  Surgeon: Clydell Hakim, MD;  Location: [place] [facility];  Service: Neurosurgery;  Laterality: N/A;   Past Surgical History:  Procedure Laterality Date  . BACK SURGERY    . 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 EF 45% in RAO, 60% LAO (51% stress, 54% rest by NM stress; 57-59% echo 10/2010)  . CARPAL TUNNEL RELEASE     Bilateral  . CERVICAL DISCECTOMY  12/17/2017   titanium plates 12-17-17  . COLONOSCOPY    . HAND AMPUTATION  2007  . ORIF ANKLE FRACTURE Left 02/23/2020   Procedure: OPEN REDUCTION INTERNAL FIXATION (ORIF) ANKLE FRACTURE;  Surgeon: Nicholes Stairs, MD;  Location: [place];  Service: Orthopedics;  Laterality: Left;  . POLYPECTOMY     pt states had colon in [place] and he had polyps- he has no idea what type   . ROTATOR CUFF REPAIR     Left  . SPINAL CORD STIMULATOR IMPLANT    . SPINAL CORD STIMULATOR REMOVAL N/A 10/26/2015   Procedure: LUMBAR SPINAL CORD STIMULATOR REMOVAL;  Surgeon: Clydell Hakim, MD;  Location: [place] [facility];  Service: Neurosurgery;  Laterality: N/A;   Past Medical History:  Diagnosis Date  . Allergy   . Anxiety   . Asthma    SEASONAL   . Blood transfusion without reported diagnosis   . Chronic back pain    Neurostimulator  . Depression   . Environmental allergies   . Hypertension   . Neuromuscular disorder ([place])   . Neuropathy   . Seasonal allergic conjunctivitis    BP 105/79   Pulse 80   Temp 98.2 F (36.8 C)   Ht 5\' 7"  (1.702 m)   Wt 267 lb 12.8 oz (121.5 kg)   SpO2 94%   BMI 41.94 kg/m   Opioid Risk Score:   Fall Risk Score:  `1  Depression screen PHQ 2/9  Depression screen [facility] 2/9 07/18/2020 02/06/2020  09/21/2019  06/08/2019 03/16/2019 02/15/2019 09/09/2018  Decreased Interest 0 1 0 0 0 0 0  Down, Depressed, Hopeless 0 1 0 0 0 0 1  PHQ - 2 Score 0 2 0 0 0 0 1  Altered sleeping 2 1 0 0 - 0 1  Tired, decreased energy 0 1 0 0 - 0 0  Change in appetite 0 0 0 0 - 0 0  Feeling bad or failure about yourself  0 0 0 0 - 0 0  Trouble concentrating 1 0 0 0 - 0 0  Moving slowly or fidgety/restless 0 0 0 0 - 0 0  Suicidal thoughts 0 0 0 0 - 0 0  PHQ-9 Score 3 4 0 0 - 0 2  Difficult doing work/chores Not difficult at all Somewhat difficult Not difficult at all Not difficult at all - Not difficult at all Somewhat difficult  Some recent data might be hidden    Review of Systems  Musculoskeletal: Positive for back pain and neck pain.       Whole left side  All other systems reviewed and are negative.      Objective:   Physical Exam Gen: no distress, normal appearing HEENT: oral mucosa pink and moist, NCAT Cardio: Reg rate Chest: normal effort, normal rate of breathing Abd: soft, non-distended Ext: no edema Skin: intact Neuro: Alert and oriented x3 Musculoskeletal: Left upper extremity forearm amputation, well healed. Left foot joint tenderness Psych: pleasant, normal affect, very friendly     Assessment & Plan:  Mr. Oboyle is a 54 year old man who presents for follow up of following issues:  1) Left upper extremity forearm amputation: -neuropathic pain.  -Gabapentin helps. He takes 600mg  TID. It does not make him sleepy.  2) Left foot pain after lateral malleolus fracture: -Prescribed Percocet 10mg -325mg  4 times per day prn. Discussed switching to 5mg  tabs up to 6 times per day to decrease risk of respiratory depression, especially when taking Xanax.  3) Lumbar facet arthropathy, L4-L5 -MRI ordered as he is interested in medial branch block injections. -Discussed [employer] PNS system as an option of pain treatment via neuromodulation. Provided following link for patient to learn more  about the system: https://www.spr[employer].com/.  -Advised turmeric to help decrease inflammation  40 minutes spent in discussion of pain, opioid dosing, risks of opioid use (especially concurrent with Xanax), not taking opioid and Xanax at the same time, calling me if he experiences respiratory depression, goal of weaning, medial branch block, getting lumbar MRI, [employer] PNS, support from wife, daily function

## 2020-09-04 ENCOUNTER — Telehealth: Payer: Self-pay

## 2020-09-04 NOTE — Progress Notes (Signed)
Chronic Care Management Pharmacy Assistant   Name: Zohaib Heeney  MRN: [id number] DOB: 28-Mar-1966  Reason for Encounter: Initial Call  Patient Questions:  1.  Have you seen any other providers since your last visit?  Patient stated he was seen for pain management.   2.  Any changes in your medicines or health?  Patient stated no changes in medications or health at this time.[name],  54 y.o. , male presents for their Initial CCM visit with the clinical pharmacist via telephone.  PCP : Brunetta Jeans, PA-C  Allergies:   Allergies  Allergen Reactions  . Aspirin Swelling  . Hydrocodone Itching    Medications: Outpatient Encounter Medications as of 09/04/2020  Medication Sig  . allopurinol (ZYLOPRIM) 100 MG tablet Take 1 tablet (100 mg total) by mouth daily.  Marland Kitchen ALPRAZolam (XANAX) 0.25 MG tablet Take 1 tablet (0.25 mg total) by mouth 2 (two) times daily as needed for anxiety.  Marland Kitchen atorvastatin (LIPITOR) 10 MG tablet TAKE 1 TABLET(10 MG) BY MOUTH DAILY (Patient taking differently: Take 10 mg by mouth daily. )  . cyclobenzaprine (FLEXERIL) 10 MG tablet Take 1 tablet (10 mg total) by mouth at bedtime. (Patient taking differently: Take 10 mg by mouth at bedtime as needed for muscle spasms. )  . gabapentin (NEURONTIN) 300 MG capsule Take 1 capsule each morning and midday along with your 600 mg dose to make a total of 900 mg (Patient taking differently: Take 300 mg by mouth daily. Taking daily at noon along with 600mg  = 900mg )  . gabapentin (NEURONTIN) 600 MG tablet Take 1 tablet (600 mg total) by mouth 3 (three) times daily.  Marland Kitchen ipratropium-albuterol (DUONEB) 0.5-2.5 (3) MG/3ML SOLN Take 3 mLs by nebulization 3 (three) times daily. (Patient taking differently: Take 3 mLs by nebulization every 8 (eight) hours as needed (shortness of breath, wheezing). )  . lisinopril (ZESTRIL) 20 MG tablet TAKE 1 TABLET(20 MG) BY MOUTH DAILY  . montelukast (SINGULAIR) 10 MG tablet Take 1  tablet (10 mg total) by mouth daily.  Marland Kitchen oxyCODONE-acetaminophen (PERCOCET) 10-325 MG tablet TK 1 T PO Q 6 H PRN  . sertraline (ZOLOFT) 50 MG tablet Take 1 tablet (50 mg total) by mouth daily. (Patient taking differently: Take 50 mg by mouth at bedtime. )  . terbinafine (LAMISIL) 250 MG tablet Take 1 tablet (250 mg total) by mouth daily.  Marland Kitchen tiZANidine (ZANAFLEX) 2 MG tablet Take 2 mg by mouth daily as needed for muscle spasms.    No facility-administered encounter medications on file as of 09/04/2020.    Current Diagnosis: Patient Active Problem List   Diagnosis Date Noted  . Pain of toe of left foot 04/16/2020  . Closed fracture of lateral malleolus 03/08/2020  . Closed left ankle fracture 02/16/2020  . Ankle fracture, bimalleolar, closed, left, initial encounter 02/15/2020  . Morbid obesity ([place]) 12/27/2019  . HTN (hypertension) 11/18/2019  . Colon cancer screening 05/25/2018  . Anxiety and depression 03/03/2017  . Cervical disc disorder with radiculopathy of cervical region 07/24/2015  . 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 Amputation of arm below elbow, left ([place]) 02/04/2015  . Visit for preventive health examination 02/04/2015  . Chronic back pain greater than 3 months duration 01/23/2015  . 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 Amputation stump complication ([place]) 15/17/6160   Have you seen any other providers since your last visit?  Patient stated he has not seen any  other providers.  Any changes in your medications or health?  Patient stated no changes in medications at this time.  Any side effects from any medications?  Patient stated he did not have any side effects from medications.  Do you have an symptoms or problems not managed by your medications?  Patient stated he did not have have any symptoms or problems from medications.  Any concerns about your health right now? Patient stated he does not have any  concerns at this time.   Has your provider asked that you check blood pressure, blood sugar, or follow special diet at home?  Patient stated he does not check blood pressure, blood sugar, and he is not on a special diet at this time.  Do you get any type of exercise on a regular basis? Patient stated he does not get any exercise due to limited ability from a broken ankle. Patient stated that he is learning how to walk again.  Can you think of a goal you would like to reach for your health?  Patient stated he would like to regain mobility in ankle.  Do you have any problems getting your medications?  Patient stated he does not have any problems getting his medications at this time.  Is there anything that you would like to discuss during the appointment?  Patient stated  he does not have anything to discuss at this moment.  Please bring medications and supplements to appointment  [name] ,[facility] Clinical Pharmacist Assistant [phone]  Follow-Up:  Pharmacist Review

## 2020-09-06 ENCOUNTER — Ambulatory Visit: Payer: Medicare HMO

## 2020-09-06 ENCOUNTER — Telehealth: Payer: Self-pay

## 2020-09-06 NOTE — Progress Notes (Signed)
Patient called and stated he missed his CCM initial visit over the phone with CPP Madelin Rear due to his phone being dead.  Informed patient that we could reschedule appointment and patient agreed .   Rescheduled patients appointment to 10-04-20 at [place] ,[place] Pharmacist Assistant [phone]

## 2020-09-06 NOTE — Progress Notes (Unsigned)
Chronic Care Management Pharmacy  Name: Daniel Durham MRN: [id number]   DOB: May 27, 1966  Chief Complaint/ HPI Daniel Durham, 54 y.o., male, presents for their initial CCM visit with the clinical pharmacist via telephone due to COVID-19 pandemic .  PCP : Brunetta Jeans, PA-C  Office Visits:  04/23/2020 (PCP): Acute gout. No prior event.   Consult Visit: 08/15/2020 (ED): early Lamonte Sakai in father. 07/18/2020 (Dr Martha Clan): chronic pain syndrome, only able to walk for 5 min, movement limited by pain. Hospitalized 6 weeks for COVID in December.  Patient Active Problem List   Diagnosis Date Noted  . Pain of toe of left foot 04/16/2020  . Closed fracture of lateral malleolus 03/08/2020  . Closed left ankle fracture 02/16/2020  . Ankle fracture, bimalleolar, closed, left, initial encounter 02/15/2020  . Morbid obesity ([place]) 12/27/2019  . HTN (hypertension) 11/18/2019  . Colon cancer screening 05/25/2018  . Anxiety and depression 03/03/2017  . Cervical disc disorder with radiculopathy of cervical region 07/24/2015  . 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 Visit for preventive health examination 02/04/2015  . Chronic back pain greater than 3 months duration 01/23/2015  . 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 Amputation stump complication ([place]) 42/68/3419   Past Surgical History:  Procedure Laterality Date  . BACK SURGERY    . CARDIAC CATHETERIZATION     11/05/10 [facility], [place]): [place] of mLAD, no sign disease. EF 45% in RAO, 60% LAO (51% stress, 54% rest by NM stress; 57-59% echo 10/2010)  . CARPAL TUNNEL RELEASE     Bilateral  . CERVICAL DISCECTOMY  12/17/2017   titanium plates 12-17-17  . COLONOSCOPY    . HAND AMPUTATION  2007  . ORIF ANKLE FRACTURE Left 02/23/2020   Procedure: OPEN REDUCTION INTERNAL FIXATION (ORIF) ANKLE FRACTURE;  Surgeon: Nicholes Stairs, MD;  Location: [facility];  Service: Orthopedics;  Laterality: Left;  . POLYPECTOMY     pt states had colon in [place] and he had polyps- he has no idea what type   . ROTATOR CUFF REPAIR     Left  . SPINAL CORD STIMULATOR IMPLANT    . SPINAL CORD STIMULATOR REMOVAL N/A 10/26/2015   Procedure: LUMBAR SPINAL CORD STIMULATOR REMOVAL;  Surgeon: Clydell Hakim, MD;  Location: [facility];  Service: Neurosurgery;  Laterality: N/A;   Family History  Problem Relation Age of Onset  . Diabetes Mother        Living  . Sleep apnea Mother   . Diabetes Father 27       Deceased  . Hypertension Father   . Jaundice Father   . Hemophilia Father   . Alcoholism Father   . Cancer Maternal Grandfather        Throat  . Esophageal cancer Maternal Grandfather   . Cancer Paternal Uncle        Throat  . Esophageal cancer Paternal Uncle   . Asthma Sister   . Diabetes Sister   . Healthy Son        X1  . Healthy Daughter        X1  . Colon polyps Neg Hx   . Colon cancer Neg Hx   . Rectal cancer Neg Hx   . Stomach cancer Neg Hx    Allergies  Allergen Reactions  . Aspirin Swelling  . Hydrocodone Itching   Outpatient Encounter Medications as of 09/06/2020  Medication Sig  . allopurinol (ZYLOPRIM) 100 MG tablet Take 1 tablet (100 mg total) by mouth daily.  Marland Kitchen ALPRAZolam (XANAX) 0.25 MG tablet Take 1 tablet (0.25 mg total) by mouth 2 (two) times daily as needed for anxiety.  Marland Kitchen atorvastatin (LIPITOR) 10 MG tablet TAKE 1 TABLET(10 MG) BY MOUTH DAILY (Patient taking differently: Take 10 mg by mouth daily. )  . cyclobenzaprine (FLEXERIL) 10 MG tablet Take 1 tablet (10 mg total) by mouth at bedtime. (Patient taking differently: Take 10 mg by mouth at bedtime as needed for muscle spasms. )  . gabapentin (NEURONTIN) 300 MG capsule Take 1 capsule each morning and midday along with your 600 mg dose to make a total of 900 mg (Patient taking differently: Take 300 mg by mouth daily. Taking daily at noon along  with 659m = 9051m  . gabapentin (NEURONTIN) 600 MG tablet Take 1 tablet (600 mg total) by mouth 3 (three) times daily.  . Marland Kitchenpratropium-albuterol (DUONEB) 0.5-2.5 (3) MG/3ML SOLN Take 3 mLs by nebulization 3 (three) times daily. (Patient taking differently: Take 3 mLs by nebulization every 8 (eight) hours as needed (shortness of breath, wheezing). )  . lisinopril (ZESTRIL) 20 MG tablet TAKE 1 TABLET(20 MG) BY MOUTH DAILY  . montelukast (SINGULAIR) 10 MG tablet Take 1 tablet (10 mg total) by mouth daily.  . Marland KitchenxyCODONE-acetaminophen (PERCOCET) 10-325 MG tablet TK 1 T PO Q 6 H PRN  . sertraline (ZOLOFT) 50 MG tablet Take 1 tablet (50 mg total) by mouth daily. (Patient taking differently: Take 50 mg by mouth at bedtime. )  . terbinafine (LAMISIL) 250 MG tablet Take 1 tablet (250 mg total) by mouth daily.  . Marland KitcheniZANidine (ZANAFLEX) 2 MG tablet Take 2 mg by mouth daily as needed for muscle spasms.    No facility-administered encounter medications on file as of 09/06/2020.   Patient Care Team    Relationship Specialty Notifications Start End  MaBrunetta JeansPAVermontCP - General Physician Assistant  01/23/15   HaClydell HakimMD Consulting Physician Anesthesiology  01/08/17   JeNewman PiesMD Consulting Physician Neurosurgery  01/08/17   XuLeandrew KoyanagiMD Attending Physician Orthopedic Surgery  12/14/17   PoMadelin RearRPKingsbrook Jewish Medical Centerharmacist Pharmacist  08/01/20    Comment: [phone] Current Diagnosis/Assessment: Goals Addressed   None    Hypertension   BP goal {CHL HP UPSTREAM Pharmacist BP [phone]}  BP Readings from Last 3 Encounters:  08/29/20 105/79  08/15/20 140/85  07/18/20 (!) 163/102   Previous medications: *** Patient checks BP at home {CHL HP BP Monitoring Frequency:2041275101}. Recent home readings: ***.  Patient is currently ***at goal on the following medications:  . ***  ***We discussed diet and exercise extensively. ***  Plan  ***Continue current  medications.  Diabetes   A1c goal < ***%  Lab Results  Component Value Date/Time   HGBA1C 5.3 02/21/2020 02:14 AM   HGBA1C 5.7 09/21/2019 09:57 AM    Checking BG: {CHL HP Blood Glucose Monitoring [phone]}. Recent FBG readings: ***  Previous medications: ***. Patient is currently ***at goal on the following medications:  . ***  ***We discussed: diet and exercise extensively. ***  Plan  ***Continue current medications.  Hyperlipidemia   LDL goal < ***  Lipid Panel     Component Value Date/Time   CHOL 241 (H) 09/21/2019 0957   TRIG 290 (H) 11/17/2019 1750   HDL 56.30 09/21/2019 0957   LDLCALC 148 (H) 09/21/2019 0957    Hepatic Function Latest  Ref Rng & Units 04/23/2020 03/21/2020 02/28/2020  Total Protein 6.0 - 8.3 g/dL 7.1 6.8 7.1  Albumin 3.5 - 5.2 g/dL 4.4 4.2 3.6  AST 0 - 37 U/L 25 29 40  ALT 0 - 53 U/L 37 41 54(H)  Alk Phosphatase 39 - 117 U/L 83 90 83  Total Bilirubin 0.2 - 1.2 mg/dL 0.7 0.4 0.5  Bilirubin, Direct 0.0 - 0.2 mg/dL - - -    The ASCVD Risk score ([place]., et al., 2013) failed to calculate for the following reasons:   The patient has a prior MI or stroke diagnosis   Patient has failed these meds in past: *** Patient is currently***at goal the following medications:  . ***  We discussed diet and exercise extensively. ***  Plan  ***Continue current medications.  AFIB   Pulse Readings from Last 3 Encounters:  08/29/20 80  08/15/20 88  07/18/20 82   ***Patient is currently rate controlled on the following medications:  . ***  Prevention of stroke in Afib. CHA2DS2/VAS Stroke Risk Points      N/A >= 2 Points: High Risk  1 - 1.99 Points: Medium Risk  0 Points: Low Risk    Last Change: N/A      This score determines the patient's risk of having a stroke if the  patient has atrial fibrillation.      This score is not applicable to this patient. Components are not  calculated.   . ***Denies any abnormal bruising,  bleeding from nose or gums or blood in urine or stool. Patient is currently ***controlled on the following medications:  . ***  Plan  ***Continue current medications.  Heart Failure   Type: {type of heart failure:30421350}. Last ejection fraction: ***. Previous medications: ***.  ***Patient is currently controlled on the following medications:  . ***  ***We discussed weighing daily; if you gain more than 3 pounds in one day or 5 pounds in one week call doctor.  Plan  Continue current medications.  ***COPD ***Tobacco Cessation     Last spirometry: ***.Gold Grade: {CHL HP Upstream Pharm COPD Gold EHUDJ:[id number]} Current COPD Classification:  {CHL HP Upstream Pharm COPD Classification:[phone]} Lab Results  Component Value Date/Time   EOSPCT 1.0 04/23/2020 02:45 PM   Lab Results  Component Value Date/Time   EOSABS 0.1 04/23/2020 02:45 PM    Previous medications: Using maintenance inhaler regularly? {yes/no:20286}. Frequency of rescue inhaler use:  {CHL HP Upstream Pharm Inhaler YIFO:2774128786} Patient is currently {CHL Controlled/Uncontrolled:7621716833} on the following medications: ***  Nicotine Abuse     Social History   Tobacco Use  Smoking Status Never Smoker  Smokeless Tobacco Never Used   Patient smokes {Time to first cigarette:23873} Patient triggers include: {Smoking Triggers:23882} On a scale of 1-10, reports MOTIVATION to quit is *** On a scale of 1-10, reports CONFIDENCE in quitting is *** Previous quit attempts included: *** Patient is currently on the following medications:  . ***  We discussed: {Smoking Cessation Counseling:23883}  Plan  ***Continue current medications.  Hypothyroidism   Lab Results  Component Value Date/Time   TSH 1.120 11/18/2019 11:03 AM   TSH 1.29 02/25/2019 10:32 AM   TSH 4.48 02/17/2018 09:43 AM   TSH is currently within range on the following medications:  . ***  We discussed:  {CHL HP Upstream Pharmacy  [phone]}.  Plan  ***Continue current medications.  GERD   ***Patient denies recent acid reflux.  Currently controlled on: . ***  We discussed: Avoidance of  potential triggers such as alcohol, fatty foods, lying down after eating, and tomato sauce.  Plan   ***Continue current medications.  Osteopenia***Osteoporosis   Last DEXA Scan: ***.  VITD  Date Value Ref Range Status  04/11/2015 19.23 (L) 30.00 - 100.00 ng/mL Final    Previous medications: ***. Patient {is;is not an osteoporosis candidate:23886}.  Current medications: . ***  We discussed: {Osteoporosis Counseling:23892}.  Plan  ***Continue current medications.  Depression   PHQ9 SCORE ONLY 07/18/2020 02/06/2020 09/21/2019  PHQ-9 Total Score 3 4 0   Previous medications: ***. ***Denies side effects, reports ongoing benefit of taking medication. ***Patient is currently controlled on the following medications:  . ***  Plan  ***Continue current medications.  ***   Previous medications: ***. Patient is currently {CHL Controlled/Uncontrolled:[phone]} on the following medications:  . ***  We discussed:  ***  Plan  Continue {CHL HP Upstream Pharmacy Plans:[phone]}.  Vaccines   Immunization History  Administered Date(s) Administered  . Influenza Inj Mdck Quad With Preservative 09/19/2017  . Influenza Split 09/10/2015  . Influenza,inj,Quad PF,6+ Mos 08/11/2016, 08/06/2017, 06/08/2018, 08/31/2019  . Influenza-Unspecified 09/23/2017  . Tdap 01/31/2015    Reviewed and discussed patient's vaccination history.    Plan  Recommended patient receive ***.   Medication Management / Care Coordination   Receives prescription medications from:  [facility], [address] [address] Phone: [phone] Fax: [phone]  [facility] - [place], [place] [address] Phone: [phone] Fax: [phone]    ***  Plan  {US Pharmacy EVOJ:50093}. ___________________________ SDOH (Social Determinants of Health) assessments performed: Yes.  Future Appointments  Date Time Provider [place]  09/06/2020  3:30 PM [facility] CCM PHARMACIST [facility]  10/03/2020 10:20 AM Raulkar, Clide Deutscher, MD [facility] CPR   Visit follow-up:  . CPA follow-up: ***. Marland Kitchen RPH follow-up: *** month *** visit.  Madelin Rear, Pharm.D., BCGP Clinical Pharmacist [facility] [phone]

## 2020-09-11 ENCOUNTER — Other Ambulatory Visit: Payer: Self-pay | Admitting: Physician Assistant

## 2020-09-11 DIAGNOSIS — M5010 Cervical disc disorder with radiculopathy, unspecified cervical region: Secondary | ICD-10-CM

## 2020-09-11 NOTE — Telephone Encounter (Signed)
Patient is requesting the following  Gabapentin 600mg    0 refills  Last refill 05/02/2020

## 2020-09-17 ENCOUNTER — Other Ambulatory Visit: Payer: Self-pay | Admitting: Physical Medicine and Rehabilitation

## 2020-09-17 DIAGNOSIS — M5010 Cervical disc disorder with radiculopathy, unspecified cervical region: Secondary | ICD-10-CM

## 2020-09-17 DIAGNOSIS — G8929 Other chronic pain: Secondary | ICD-10-CM

## 2020-09-17 DIAGNOSIS — M549 Dorsalgia, unspecified: Secondary | ICD-10-CM

## 2020-09-17 MED ORDER — OXYCODONE-ACETAMINOPHEN 10-325 MG PO TABS: ORAL_TABLET | ORAL | 0 refills | Status: DC

## 2020-09-25 ENCOUNTER — Other Ambulatory Visit: Payer: Self-pay | Admitting: Physician Assistant

## 2020-09-25 DIAGNOSIS — F419 Anxiety disorder, unspecified: Secondary | ICD-10-CM

## 2020-09-25 DIAGNOSIS — F32A Depression, unspecified: Secondary | ICD-10-CM

## 2020-09-25 MED ORDER — ALPRAZOLAM 0.25 MG PO TABS: 0.2500 mg | ORAL_TABLET | Freq: Two times a day (BID) | ORAL | 0 refills | Status: DC | PRN

## 2020-09-25 NOTE — Telephone Encounter (Signed)
LFD 08/29/20 #30 with no refills LOV 04/23/20 NOV none

## 2020-09-26 ENCOUNTER — Telehealth: Payer: Self-pay

## 2020-09-26 ENCOUNTER — Other Ambulatory Visit: Payer: Self-pay | Admitting: Emergency Medicine

## 2020-09-26 DIAGNOSIS — J4530 Mild persistent asthma, uncomplicated: Secondary | ICD-10-CM

## 2020-09-26 MED ORDER — IPRATROPIUM-ALBUTEROL 0.5-2.5 (3) MG/3ML IN SOLN: 3.0000 mL | Freq: Three times a day (TID) | RESPIRATORY_TRACT | 11 refills | Status: AC | PRN

## 2020-09-26 MED ORDER — ALBUTEROL SULFATE HFA 108 (90 BASE) MCG/ACT IN AERS: 1.0000 | INHALATION_SPRAY | Freq: Four times a day (QID) | RESPIRATORY_TRACT | 2 refills | Status: AC | PRN

## 2020-09-26 NOTE — Telephone Encounter (Signed)
Rx sent to the preferred patient pharmacy  

## 2020-09-26 NOTE — Telephone Encounter (Signed)
  LAST APPOINTMENT DATE: 04/24/2020  NEXT APPOINTMENT DATE:@12 /12/2019 - Pharmacy Call   MEDICATION: Albuterol   PHARMACY: [id number] - [place], [place] - [address] AT [place]  COMMENTS: Patient states he was prescribed this medication back last year when her had COVID and was in the hospital and only has a few puffs left in the inhaler. Wanted to know if he can get a refill.

## 2020-10-01 ENCOUNTER — Telehealth: Payer: Self-pay

## 2020-10-01 ENCOUNTER — Ambulatory Visit (HOSPITAL_COMMUNITY)
Admission: RE | Admit: 2020-10-01 | Discharge: 2020-10-01 | Disposition: A | Discharge status: Home / Self Care | Payer: Medicare HMO | Source: Ambulatory Visit | Attending: Physical Medicine and Rehabilitation | Admitting: Physical Medicine and Rehabilitation

## 2020-10-01 ENCOUNTER — Other Ambulatory Visit: Payer: Self-pay

## 2020-10-01 DIAGNOSIS — M545 Low back pain, unspecified: Secondary | ICD-10-CM | POA: Diagnosis not present

## 2020-10-01 DIAGNOSIS — M4807 Spinal stenosis, lumbosacral region: Secondary | ICD-10-CM | POA: Diagnosis not present

## 2020-10-01 NOTE — Progress Notes (Signed)
Chronic Care Management Pharmacy Assistant   Name: Daniel Durham  MRN: [id number] DOB: July 25, 1966  Reason for Encounter: Medication Review/ Initial Visit   Daniel Durham,  54 y.o. , male presents for their Initial CCM visit with the clinical pharmacist via telephone.  PCP : Brunetta Jeans, PA-C  Allergies:   Allergies  Allergen Reactions  . Aspirin Swelling  . Hydrocodone Itching    Medications: Outpatient Encounter Medications as of 10/01/2020  Medication Sig  . albuterol (VENTOLIN HFA) 108 (90 Base) MCG/ACT inhaler Inhale 1-2 puffs into the lungs every 6 (six) hours as needed for wheezing or shortness of breath.  . allopurinol (ZYLOPRIM) 100 MG tablet Take 1 tablet (100 mg total) by mouth daily.  Marland Kitchen ALPRAZolam (XANAX) 0.25 MG tablet Take 1 tablet (0.25 mg total) by mouth 2 (two) times daily as needed for anxiety.  Marland Kitchen atorvastatin (LIPITOR) 10 MG tablet TAKE 1 TABLET(10 MG) BY MOUTH DAILY (Patient taking differently: Take 10 mg by mouth daily. )  . cyclobenzaprine (FLEXERIL) 10 MG tablet Take 1 tablet (10 mg total) by mouth at bedtime. (Patient taking differently: Take 10 mg by mouth at bedtime as needed for muscle spasms. )  . gabapentin (NEURONTIN) 300 MG capsule Take 1 capsule each morning and midday along with your 600 mg dose to make a total of 900 mg (Patient taking differently: Take 300 mg by mouth daily. Taking daily at noon along with 600mg  = 900mg )  . gabapentin (NEURONTIN) 600 MG tablet TAKE 1 TABLET(600 MG) BY MOUTH THREE TIMES DAILY  . ipratropium-albuterol (DUONEB) 0.5-2.5 (3) MG/3ML SOLN Take 3 mLs by nebulization every 8 (eight) hours as needed (shortness of breath, wheezing).  Marland Kitchen lisinopril (ZESTRIL) 20 MG tablet TAKE 1 TABLET(20 MG) BY MOUTH DAILY  . montelukast (SINGULAIR) 10 MG tablet Take 1 tablet (10 mg total) by mouth daily.  Marland Kitchen oxyCODONE-acetaminophen (PERCOCET) 10-325 MG tablet TK 1 T PO Q 6 H PRN  . sertraline (ZOLOFT) 50 MG tablet Take 1 tablet (50  mg total) by mouth daily. (Patient taking differently: Take 50 mg by mouth at bedtime. )  . terbinafine (LAMISIL) 250 MG tablet Take 1 tablet (250 mg total) by mouth daily.  Marland Kitchen tiZANidine (ZANAFLEX) 2 MG tablet Take 2 mg by mouth daily as needed for muscle spasms.    No facility-administered encounter medications on file as of 10/01/2020.    Current Diagnosis: Patient Active Problem List   Diagnosis Date Noted  . Pain of toe of left foot 04/16/2020  . Closed fracture of lateral malleolus 03/08/2020  . Closed left ankle fracture 02/16/2020  . Ankle fracture, bimalleolar, closed, left, initial encounter 02/15/2020  . Morbid obesity ([place]) 12/27/2019  . HTN (hypertension) 11/18/2019  . Colon cancer screening 05/25/2018  . Anxiety and depression 03/03/2017  . Cervical disc disorder with radiculopathy of cervical region 07/24/2015  . 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 Visit for preventive health examination 02/04/2015  . Chronic back pain greater than 3 months duration 01/23/2015  . 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         Patient stated he has seen his pain doctor and stated he got an mri done this morning 10/01/20.  Any changes in your medications or health?  Patient stated he has no changes in medication or health at this time.  Any side effects from any medications?          Patient states he has no side effects from any medications.  Do you have an symptoms or problems not managed by your medications?           Patient stated he has no symptoms or problems not managed by medications at this time.  Any concerns about your health right now?           Patient states he has no concerns about his health at this time. Patient states he fell In the yard  again and was experiencing ankle pain.   Has your provider asked that you check blood pressure, blood sugar, or follow special diet at home?            Patient stated he does not check blood pressure , blood sugar or follow a diet at home.  Do you get any type of exercise on a regular basis?          Patient stated he is starting to get around better from his broke ankle and from having covid. Stated he was in the hospital due to covid for 6 weeks.  Can you think of a goal you would like to reach for your health?       Patient states he would like to lose weight. Patient states being in hospital and  braking his ankle has contributed to him not being active which has led him to gain weight  Do you have any problems getting your medications?         Patient states he has no problems getting medications.  Is there anything that you would like to discuss during the appointment?     Patient states not at this moment  Please bring medications and supplements to appointment    Daniel Durham ,[facility] Clinical Pharmacist Assistant [phone]    Follow-Up:  Pharmacist Review

## 2020-10-03 ENCOUNTER — Encounter: Payer: Medicare HMO | Attending: Physical Medicine and Rehabilitation | Admitting: Physical Medicine and Rehabilitation

## 2020-10-03 ENCOUNTER — Other Ambulatory Visit: Payer: Self-pay

## 2020-10-03 ENCOUNTER — Encounter: Payer: Self-pay | Admitting: Physical Medicine and Rehabilitation

## 2020-10-03 DIAGNOSIS — M549 Dorsalgia, unspecified: Secondary | ICD-10-CM

## 2020-10-03 DIAGNOSIS — M5010 Cervical disc disorder with radiculopathy, unspecified cervical region: Secondary | ICD-10-CM | POA: Diagnosis not present

## 2020-10-03 DIAGNOSIS — G8929 Other chronic pain: Secondary | ICD-10-CM

## 2020-10-03 MED ORDER — OXYCODONE-ACETAMINOPHEN 10-325 MG PO TABS: 1.0000 | ORAL_TABLET | Freq: Four times a day (QID) | ORAL | 0 refills | Status: DC | PRN

## 2020-10-03 MED ORDER — NORTRIPTYLINE HCL 10 MG PO CAPS: 10.0000 mg | ORAL_CAPSULE | Freq: Every day | ORAL | 1 refills | Status: DC

## 2020-10-03 NOTE — Progress Notes (Addendum)
Subjective:    Patient ID: Daniel Durham, male    DOB: January 05, 1966, 54 y.o.   MRN: [id number]  HPI  Daniel Durham is a 54 year old pleasant man who presents for follow-up of lumbar spinal stenosis.  Had a fall since last visit.  Daniel Durham has been spending time outside. Daniel Durham can go outside in shorts and T shirt as Daniel Durham is from [place]. Daniel Durham tries to spend as much time outdoors as possible and felt Daniel Durham has lost weight recently.  Daniel Durham got a lumbar spine MRI that shows the following: 1. Progressive canal stenosis at L2-L3 (now mild to moderate) and L3-L4 (now moderate), which appears to be largely due to increasing dorsal epidural lipomatosis with superimposed small disc bulge at L3-L4. 2. No substantial change in postoperative changes at L5-S1 with suspected granulation tissue in the left lateral recess. No convincing stenosis at this level.  In the last couple of weeks Daniel Durham has had a lot of nightmares. His wife notices that Daniel Durham is crying. This is not new for him, but it is happening more regularly. These have been preventing him from going back to sleep. Daniel Durham already does not sleep well.   Daniel Durham feels that the pain is inhibiting his ability to do his functional tasks.   Average pain is 7/10.    Pain Inventory Average Pain 7 Pain Right Now 5 My pain is sharp, stabbing, tingling and aching  In the last 24 hours, has pain interfered with the following? General activity 5 Relation with others 8 Enjoyment of life 8 What TIME of day is your pain at its worst? daytime and evening Sleep (in general) Poor  Pain is worse with: walking, bending, sitting, standing and some activites Pain improves with: rest and medication Relief from Meds: 8  Family History  Problem Relation Age of Onset  . Diabetes Mother        Living  . Sleep apnea Mother   . Diabetes Father 74       Deceased  . Hypertension Father   . Jaundice Father   . Hemophilia Father   . Alcoholism Father   . Cancer Maternal Grandfather         Throat  . Esophageal cancer Maternal Grandfather   . Cancer Paternal Uncle        Throat  . Esophageal cancer Paternal Uncle   . Asthma Sister   . Diabetes Sister   . Healthy Son        X1  . Healthy Daughter        X1  . Colon polyps Neg Hx   . Colon cancer Neg Hx   . Rectal cancer Neg Hx   . Stomach cancer Neg Hx    Social History   Socioeconomic History  . Marital status: Divorced    Spouse name: Not on file  . Number of children: Not on file  . Years of education: Not on file  . Highest education level: Not on file  Occupational History  . Not on file  Tobacco Use  . Smoking status: Never Smoker  . Smokeless tobacco: Never Used  Vaping Use  . Vaping Use: Never used  Substance and Sexual Activity  . Alcohol use: Yes    Alcohol/week: 0.0 standard drinks    Comment: occ  . Drug use: No  . Sexual activity: Yes  Other Topics Concern  . Not on file  Social History Narrative  . Not on file   Social Determinants of  Health   Financial Resource Strain:   . Difficulty of Paying Living Expenses: Not on file  Food Insecurity:   . Worried About Charity fundraiser in the Last Year: Not on file  . Ran Out of Food in the Last Year: Not on file  Transportation Needs:   . Lack of Transportation (Medical): Not on file  . Lack of Transportation (Non-Medical): Not on file  Physical Activity:   . Days of Exercise per Week: Not on file  . Minutes of Exercise per Session: Not on file  Stress:   . Feeling of Stress : Not on file  Social Connections:   . Frequency of Communication with Friends and Family: Not on file  . Frequency of Social Gatherings with Friends and Family: Not on file  . Attends Religious Services: Not on file  . Active Member of Clubs or Organizations: Not on file  . Attends Archivist Meetings: Not on file  . Marital Status: Not on file   Past Surgical History:  Procedure Laterality Date  . BACK SURGERY    . CARDIAC CATHETERIZATION      11/05/10 [facility], [place]): Briding of mLAD, no sign disease. EF 45% in RAO, 60% LAO (51% stress, 54% rest by NM stress; 57-59% echo 10/2010)  . CARPAL TUNNEL RELEASE     Bilateral  . CERVICAL DISCECTOMY  12/17/2017   titanium plates 12-17-17  . COLONOSCOPY    . HAND AMPUTATION  2007  . ORIF ANKLE FRACTURE Left 02/23/2020   Procedure: OPEN REDUCTION INTERNAL FIXATION (ORIF) ANKLE FRACTURE;  Surgeon: Nicholes Stairs, MD;  Location: [place];  Service: Orthopedics;  Laterality: Left;  . POLYPECTOMY     pt states had colon in [place] and Daniel Durham had polyps- Daniel Durham has no idea what type   . ROTATOR CUFF REPAIR     Left  . SPINAL CORD STIMULATOR IMPLANT    . SPINAL CORD STIMULATOR REMOVAL N/A 10/26/2015   Procedure: LUMBAR SPINAL CORD STIMULATOR REMOVAL;  Surgeon: Clydell Hakim, MD;  Location: [place] [facility];  Service: Neurosurgery;  Laterality: N/A;   Past Surgical History:  Procedure Laterality Date  . BACK SURGERY    . CARDIAC CATHETERIZATION     11/05/10 [facility], [place]): Briding of mLAD, no sign disease. EF 45% in RAO, 60% LAO (51% stress, 54% rest by NM stress; 57-59% echo 10/2010)  . CARPAL TUNNEL RELEASE     Bilateral  . CERVICAL DISCECTOMY  12/17/2017   titanium plates 12-17-17  . COLONOSCOPY    . HAND AMPUTATION  2007  . ORIF ANKLE FRACTURE Left 02/23/2020   Procedure: OPEN REDUCTION INTERNAL FIXATION (ORIF) ANKLE FRACTURE;  Surgeon: Nicholes Stairs, MD;  Location: [place];  Service: Orthopedics;  Laterality: Left;  . POLYPECTOMY     pt states had colon in [place] and Daniel Durham had polyps- Daniel Durham has no idea what type   . ROTATOR CUFF REPAIR     Left  . SPINAL CORD STIMULATOR IMPLANT    . SPINAL CORD STIMULATOR REMOVAL N/A 10/26/2015   Procedure: LUMBAR SPINAL CORD STIMULATOR REMOVAL;  Surgeon: Clydell Hakim, MD;  Location: [place] [facility];  Service: Neurosurgery;  Laterality: N/A;   Past Medical History:  Diagnosis Date  . Allergy   . Anxiety   . Asthma    SEASONAL    . Blood transfusion without reported diagnosis   . Chronic back pain    Neurostimulator  . Depression   . Environmental  allergies   . Hypertension   . Neuromuscular disorder ([place])   . Neuropathy   . Seasonal allergic conjunctivitis    BP (!) 160/97   Pulse 74   Temp 97.9 F (36.6 C)   Ht 5\' 7"  (1.702 m)   Wt 270 lb (122.5 kg)   SpO2 95%   BMI 42.29 kg/m   Opioid Risk Score:   Fall Risk Score:  `1  Depression screen PHQ 2/9  Depression screen [facility] 2/9 07/18/2020 02/06/2020 09/21/2019 06/08/2019 03/16/2019 02/15/2019 09/09/2018  Decreased Interest 0 1 0 0 0 0 0  Down, Depressed, Hopeless 0 1 0 0 0 0 1  PHQ - 2 Score 0 2 0 0 0 0 1  Altered sleeping 2 1 0 0 - 0 1  Tired, decreased energy 0 1 0 0 - 0 0  Change in appetite 0 0 0 0 - 0 0  Feeling bad or failure about yourself  0 0 0 0 - 0 0  Trouble concentrating 1 0 0 0 - 0 0  Moving slowly or fidgety/restless 0 0 0 0 - 0 0  Suicidal thoughts 0 0 0 0 - 0 0  PHQ-9 Score 3 4 0 0 - 0 2  Difficult doing work/chores Not difficult at all Somewhat difficult Not difficult at all Not difficult at all - Not difficult at all Somewhat difficult  Some recent data might be hidden    Review of Systems  Musculoskeletal: Positive for back pain and neck pain.       Shoulder pain Wrist pain   All other systems reviewed and are negative.      Objective:   Physical Exam Gen: no distress, normal appearing HEENT: oral mucosa pink and moist, NCAT Cardio: Reg rate Chest: normal effort, normal rate of breathing Abd: soft, non-distended Ext: no edema Skin: intact Neuro: Alert and oriented x3.  Musculoskeletal: Tenderness to palpation over lumbar spine and paraspinals. + Slump test bilaterally for pain that radiates in L2-L3-L4 distribution.  Psych: pleasant, normal affect     Assessment & Plan:  Mrs. Southwell is a 44 yar old man who presents for follow-up of the following issues:  1) Chronic Pain Syndrome secondary lumbar spinal stenosis.   -Discussed current symptoms of pain and history of pain.  -Discussed benefits of exercise in reducing pain. -Discussed following foods that may reduce pain: 1) Ginger 2) Blueberries 3) Salmon 4) Pumpkin seeds 5) dark chocolate 6) turmeric 7) tart cherries 8) virgin olive oil 9) chilli peppers 10) mint 11) red wine -Reviewed results from lumbar spine MRI:  IMPRESSION: 1. Progressive canal stenosis at L2-L3 (now mild to moderate) and L3-L4 (now moderate), which appears to be largely due to increasing dorsal epidural lipomatosis with superimposed small disc bulge at L3-L4. 2. No substantial change in postoperative changes at L5-S1 with suspected granulation tissue in the left lateral recess. No convincing stenosis at this level.  -The patient has low back pain; lower extremity pain; paresthesias; increased pain with movement changes; lower extremity weakness; and gait disturbance. The patient has no bowel or bladder dysfunction or saddle anesthesia. The patient has failed months of conservative care with physical therapy and HEP, medications, activity and lifestyle modification. MRI was performed and discussed above. The patient has had multiple ESIs before with last 2 years ago lasting 2 months.   -Daniel Durham is interested in epidural steroid injections. Will schedule with Dr. Letta Pate for bilateral L2-L3 and L3-L4 ESI. Advised that Daniel Durham will need a driver.  ADDENDUM: Discussed with Dr. Letta Pate- given MRI findings left sided L2-L3 and L3-L4 would be most appropriate- discussed with patient and Daniel Durham is scheduled for Jan 13th.   2) Nightmares secondary to PTSD: -Nortriptyline HS prescribed -Advised regarding risk of serotonin syndrome with sertraline- Daniel Durham will take latter in the morning.

## 2020-10-03 NOTE — Progress Notes (Unsigned)
Chronic Care Management Pharmacy Assistant   Name: Daniel Durham  MRN: [id number] DOB: 01-Aug-1966  Reason for Encounter: Medication Review / Initial Visit  Patient Questions:  1.  Have you seen any other providers since your last visit? {CHL THN UPSTREAM YES/NO:24149}  2.  Any changes in your medicines or health? {CHL THN UPSTREAM YES/NO:24149}   Daniel Durham,  54 y.o. , male presents for their {Initial/Follow-up:3041532} CCM visit with the clinical pharmacist {CHL HP Upstream Pharm visit KGSU:[id number]}.  PCP : Brunetta Jeans, PA-C  Allergies:   Allergies  Allergen Reactions  . Aspirin Swelling  . Hydrocodone Itching    Medications: Outpatient Encounter Medications as of 10/04/2020  Medication Sig  . albuterol (VENTOLIN HFA) 108 (90 Base) MCG/ACT inhaler Inhale 1-2 puffs into the lungs every 6 (six) hours as needed for wheezing or shortness of breath.  . allopurinol (ZYLOPRIM) 100 MG tablet Take 1 tablet (100 mg total) by mouth daily.  Marland Kitchen ALPRAZolam (XANAX) 0.25 MG tablet Take 1 tablet (0.25 mg total) by mouth 2 (two) times daily as needed for anxiety.  Marland Kitchen atorvastatin (LIPITOR) 10 MG tablet TAKE 1 TABLET(10 MG) BY MOUTH DAILY (Patient taking differently: Take 10 mg by mouth daily. )  . cyclobenzaprine (FLEXERIL) 10 MG tablet Take 1 tablet (10 mg total) by mouth at bedtime. (Patient taking differently: Take 10 mg by mouth at bedtime as needed for muscle spasms. )  . gabapentin (NEURONTIN) 300 MG capsule Take 1 capsule each morning and midday along with your 600 mg dose to make a total of 900 mg (Patient taking differently: Take 300 mg by mouth daily. Taking daily at noon along with 600mg  = 900mg )  . gabapentin (NEURONTIN) 600 MG tablet TAKE 1 TABLET(600 MG) BY MOUTH THREE TIMES DAILY  . ipratropium-albuterol (DUONEB) 0.5-2.5 (3) MG/3ML SOLN Take 3 mLs by nebulization every 8 (eight) hours as needed (shortness of breath, wheezing).  Marland Kitchen lisinopril (ZESTRIL) 20 MG tablet TAKE 1  TABLET(20 MG) BY MOUTH DAILY  . montelukast (SINGULAIR) 10 MG tablet Take 1 tablet (10 mg total) by mouth daily.  . nortriptyline (PAMELOR) 10 MG capsule Take 1 capsule (10 mg total) by mouth at bedtime.  Marland Kitchen oxyCODONE-acetaminophen (PERCOCET) 10-325 MG tablet Take 1 tablet by mouth every 6 (six) hours as needed for pain.  Marland Kitchen sertraline (ZOLOFT) 50 MG tablet Take 1 tablet (50 mg total) by mouth daily. (Patient taking differently: Take 50 mg by mouth at bedtime. )  . terbinafine (LAMISIL) 250 MG tablet Take 1 tablet (250 mg total) by mouth daily.  Marland Kitchen tiZANidine (ZANAFLEX) 2 MG tablet Take 2 mg by mouth daily as needed for muscle spasms.    No facility-administered encounter medications on file as of 10/04/2020.    Current Diagnosis: Patient Active Problem List   Diagnosis Date Noted  . Pain of toe of left foot 04/16/2020  . Closed fracture of lateral malleolus 03/08/2020  . Closed left ankle fracture 02/16/2020  . Ankle fracture, bimalleolar, closed, left, initial encounter 02/15/2020  . Morbid obesity ([place]) 12/27/2019  . HTN (hypertension) 11/18/2019  . Colon cancer screening 05/25/2018  . Anxiety and depression 03/03/2017  . Cervical disc disorder with radiculopathy of cervical region 07/24/2015  . 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 Visit for preventive health examination 02/04/2015  . Chronic back pain greater than 3 months duration 01/23/2015  . 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

## 2020-10-04 ENCOUNTER — Telehealth: Payer: Self-pay

## 2020-10-04 ENCOUNTER — Ambulatory Visit: Payer: Medicare HMO

## 2020-10-04 DIAGNOSIS — E785 Hyperlipidemia, unspecified: Secondary | ICD-10-CM

## 2020-10-04 DIAGNOSIS — I10 Essential (primary) hypertension: Secondary | ICD-10-CM

## 2020-10-04 NOTE — Progress Notes (Signed)
    Chronic Care Management Pharmacy Assistant   Name: Daniel Durham  MRN: [id number] DOB: Feb 21, 1966  Reason for Encounter: Patient Assistance Application   PCP : Brunetta Jeans, PA-C  Allergies:   Allergies  Allergen Reactions  . Aspirin Swelling  . Hydrocodone Itching    Medications: Outpatient Encounter Medications as of 10/04/2020  Medication Sig  . albuterol (VENTOLIN HFA) 108 (90 Base) MCG/ACT inhaler Inhale 1-2 puffs into the lungs every 6 (six) hours as needed for wheezing or shortness of breath.  . allopurinol (ZYLOPRIM) 100 MG tablet Take 1 tablet (100 mg total) by mouth daily.  Marland Kitchen ALPRAZolam (XANAX) 0.25 MG tablet Take 1 tablet (0.25 mg total) by mouth 2 (two) times daily as needed for anxiety.  Marland Kitchen atorvastatin (LIPITOR) 10 MG tablet TAKE 1 TABLET(10 MG) BY MOUTH DAILY (Patient taking differently: Take 10 mg by mouth daily. )  . cyclobenzaprine (FLEXERIL) 10 MG tablet Take 1 tablet (10 mg total) by mouth at bedtime. (Patient taking differently: Take 10 mg by mouth at bedtime as needed for muscle spasms. )  . gabapentin (NEURONTIN) 300 MG capsule Take 1 capsule each morning and midday along with your 600 mg dose to make a total of 900 mg (Patient taking differently: Take 300 mg by mouth daily. Taking daily at noon along with 600mg  = 900mg )  . gabapentin (NEURONTIN) 600 MG tablet TAKE 1 TABLET(600 MG) BY MOUTH THREE TIMES DAILY  . ipratropium-albuterol (DUONEB) 0.5-2.5 (3) MG/3ML SOLN Take 3 mLs by nebulization every 8 (eight) hours as needed (shortness of breath, wheezing).  Marland Kitchen lisinopril (ZESTRIL) 20 MG tablet TAKE 1 TABLET(20 MG) BY MOUTH DAILY  . montelukast (SINGULAIR) 10 MG tablet Take 1 tablet (10 mg total) by mouth daily.  . nortriptyline (PAMELOR) 10 MG capsule Take 1 capsule (10 mg total) by mouth at bedtime.  Marland Kitchen oxyCODONE-acetaminophen (PERCOCET) 10-325 MG tablet Take 1 tablet by mouth every 6 (six) hours as needed for pain.  Marland Kitchen sertraline (ZOLOFT) 50 MG tablet Take  1 tablet (50 mg total) by mouth daily. (Patient taking differently: Take 50 mg by mouth at bedtime. )   No facility-administered encounter medications on file as of 10/04/2020.    Current Diagnosis: Patient Active Problem List   Diagnosis Date Noted  . Pain of toe of left foot 04/16/2020  . Closed fracture of lateral malleolus 03/08/2020  . Closed left ankle fracture 02/16/2020  . Ankle fracture, bimalleolar, closed, left, initial encounter 02/15/2020  . Morbid obesity ([place]) 12/27/2019  . HTN (hypertension) 11/18/2019  . Colon cancer screening 05/25/2018  . Anxiety and depression 03/03/2017  . Cervical disc disorder with radiculopathy of cervical region 07/24/2015  . 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 Visit for preventive health examination 02/04/2015  . Chronic back pain greater than 3 months duration 01/23/2015  . 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 Spoke with patient regarding patient assistance application for [employer].   Informed patient I will be mailing out application for him to complete and to return to office once completed.   [name] ,[place] Pharmacist Assistant [phone]   Follow-Up:  Pharmacist Review

## 2020-10-04 NOTE — Patient Instructions (Addendum)
[name],  Thanks for reviewing your medications with me today. I will help with patient assistance application for the albuterol inhaler and request following medications to be sent to [facility]: -allopurinol 100 mg once daily -atorvastatin 10 mg once daily -lisinopril 20 mg once daily -montelukast 10 mg once daily  -sertraline 50 mg once daily  With some of the higher blood pressure readings at home, we will check on additional readings in a couple weeks. Check at different times each day throughout week and let us know if consistently >150/90 before 2 week check in.  I have also provided a "low purine" diet handout which can be helpful to minimize gout attacks.  Please review care plan below and call me at [phone] with any questions!  Thank you, Daniel Durham, Pharm.D., BCGP Clinical Pharmacist [facility] [phone]   Goals Addressed            This Visit's Progress   . PharmD Care Plan       CARE PLAN ENTRY (see longitudinal plan of care for additional care plan information)  Current Barriers:  . Chronic Disease Management support, education, and care coordination needs related to Hypertension, Hyperlipidemia, and Asthma   Hypertension BP Readings from Last 3 Encounters:  10/03/20 (!) 160/97  08/29/20 105/79  08/15/20 140/85   . Pharmacist Clinical Goal(s): o Over the next 90 days, patient will work with PharmD and providers to achieve BP goal <140/90 . Current regimen:  o Lisinopril 20 mg once daily . Interventions: o Reviewed recommendations for home monitoring. o Discussed exercise - maintain exercise through being active outside. . Patient self care activities - Over the next 90 days, patient will: o Check BP at different times throughout day, document, and provide at future appointments o Ensure daily salt intake < 2300 mg/day  Hyperlipidemia Lab Results  Component Value Date/Time    LDLCALC 148 (H) 09/21/2019 09:57 AM   . Pharmacist Clinical Goal(s): o Over the next 90 days, patient will work with PharmD and providers to achieve LDL goal < 100 . Current regimen:  o Atorvastatin 10 mg once daily  . Interventions: o Reviewed cardiovascular risk benefits of atorvastatin to help prevent heart attack and stroke. . Patient self care activities - Over the next 90 days, patient will: o Resume prescription with atorvastatin 10 mg once daily  Asthma . Pharmacist Clinical Goal(s) o Over the next 365 days, patient will work with PharmD and providers to ensure medication accessibility and safety . Current regimen:  o Duoneb as needed o Albuterol rescue inhaler as needed . Interventions: o Discussed patient assistance - agreed to pursue as appropriate for rescue/maintenance inhalers . Patient self care activities - Over the next 365 days, patient will: o Reach out to pharmacist with any questions  Medication management . Pharmacist Clinical Goal(s): o Over the next 365 days, patient will work with PharmD and providers to achieve optimal medication adherence . Current pharmacy: [facility] . Interventions o Comprehensive medication review performed. o Continue current medication management strategy . Patient self care activities - Over the next 365 days, patient will: o Take medications as prescribed o Report any questions or concerns to PharmD and/or provider(s) Initial goal documentation.      The patient verbalized understanding of instructions provided today and agreed to receive a mailed copy or copy through patient message of patient instruction and/or educational materials. Telephone follow up appointment with  pharmacy team member scheduled for: See next appointment with "Care Management Staff" under "What's Next" below.    Hypertension, Adult High blood pressure (hypertension) is when the force of blood pumping through the arteries is too  strong. The arteries are the blood vessels that carry blood from the heart throughout the body. Hypertension forces the heart to work harder to pump blood and may cause arteries to become narrow or stiff. Untreated or uncontrolled hypertension can cause a heart attack, heart failure, a stroke, kidney disease, and other problems. A blood pressure reading consists of a higher number over a lower number. Ideally, your blood pressure should be below 120/80. The first ("top") number is called the systolic pressure. It is a measure of the pressure in your arteries as your heart beats. The second ("bottom") number is called the diastolic pressure. It is a measure of the pressure in your arteries as the heart relaxes. What are the causes? The exact cause of this condition is not known. There are some conditions that result in or are related to high blood pressure. What increases the risk? Some risk factors for high blood pressure are under your control. The following factors may make you more likely to develop this condition:  Smoking.  Having type 2 diabetes mellitus, high cholesterol, or both.  Not getting enough exercise or physical activity.  Being overweight.  Having too much fat, sugar, calories, or salt (sodium) in your diet.  Drinking too much alcohol. Some risk factors for high blood pressure may be difficult or impossible to change. Some of these factors include:  Having chronic kidney disease.  Having a family history of high blood pressure.  Age. Risk increases with age.  Race. You may be at higher risk if you are African American.  Gender. Men are at higher risk than women before age 45. After age 62, women are at higher risk than men.  Having obstructive sleep apnea.  Stress. What are the signs or symptoms? High blood pressure may not cause symptoms. Very high blood pressure (hypertensive crisis) may cause:  Headache.  Anxiety.  Shortness of  breath.  Nosebleed.  Nausea and vomiting.  Vision changes.  Severe chest pain.  Seizures. How is this diagnosed? This condition is diagnosed by measuring your blood pressure while you are seated, with your arm resting on a flat surface, your legs uncrossed, and your feet flat on the floor. The cuff of the blood pressure monitor will be placed directly against the skin of your upper arm at the level of your heart. It should be measured at least twice using the same arm. Certain conditions can cause a difference in blood pressure between your right and left arms. Certain factors can cause blood pressure readings to be lower or higher than normal for a short period of time:  When your blood pressure is higher when you are in a health care provider's office than when you are at home, this is called white coat hypertension. Most people with this condition do not need medicines.  When your blood pressure is higher at home than when you are in a health care provider's office, this is called masked hypertension. Most people with this condition may need medicines to control blood pressure. If you have a high blood pressure reading during one visit or you have normal blood pressure with other risk factors, you may be asked to:  Return on a different day to have your blood pressure checked again.  Monitor your blood  pressure at home for 1 week or longer. If you are diagnosed with hypertension, you may have other blood or imaging tests to help your health care provider understand your overall risk for other conditions. How is this treated? This condition is treated by making healthy lifestyle changes, such as eating healthy foods, exercising more, and reducing your alcohol intake. Your health care provider may prescribe medicine if lifestyle changes are not enough to get your blood pressure under control, and if:  Your systolic blood pressure is above 130.  Your diastolic blood pressure is above  80. Your personal target blood pressure may vary depending on your medical conditions, your age, and other factors. Follow these instructions at home: Eating and drinking   Eat a diet that is high in fiber and potassium, and low in sodium, added sugar, and fat. An example eating plan is called the DASH (Dietary Approaches to Stop Hypertension) diet. To eat this way: ? Eat plenty of fresh fruits and vegetables. Try to fill one half of your plate at each meal with fruits and vegetables. ? Eat whole grains, such as whole-wheat pasta, brown rice, or whole-grain bread. Fill about one fourth of your plate with whole grains. ? Eat or drink low-fat dairy products, such as skim milk or low-fat yogurt. ? Avoid fatty cuts of meat, processed or cured meats, and poultry with skin. Fill about one fourth of your plate with lean proteins, such as fish, chicken without skin, beans, eggs, or tofu. ? Avoid pre-made and processed foods. These tend to be higher in sodium, added sugar, and fat.  Reduce your daily sodium intake. Most people with hypertension should eat less than 1,500 mg of sodium a day.  Do not drink alcohol if: ? Your health care provider tells you not to drink. ? You are pregnant, may be pregnant, or are planning to become pregnant.  If you drink alcohol: ? Limit how much you use to:  0-1 drink a day for women.  0-2 drinks a day for men. ? Be aware of how much alcohol is in your drink. In the [place] one drink equals one 12 oz bottle of beer (355 mL), one 5 oz glass of wine (148 mL), or one 1 oz glass of hard liquor (44 mL). Lifestyle   Work with your health care provider to maintain a healthy body weight or to lose weight. Ask what an ideal weight is for you.  Get at least 30 minutes of exercise most days of the week. Activities may include walking, swimming, or biking.  Include exercise to strengthen your muscles (resistance exercise), such as Pilates or lifting weights, as part of  your weekly exercise routine. Try to do these types of exercises for 30 minutes at least 3 days a week.  Do not use any products that contain nicotine or tobacco, such as cigarettes, e-cigarettes, and chewing tobacco. If you need help quitting, ask your health care provider.  Monitor your blood pressure at home as told by your health care provider.  Keep all follow-up visits as told by your health care provider. This is important. Medicines  Take over-the-counter and prescription medicines only as told by your health care provider. Follow directions carefully. Blood pressure medicines must be taken as prescribed.  Do not skip doses of blood pressure medicine. Doing this puts you at risk for problems and can make the medicine less effective.  Ask your health care provider about side effects or reactions to medicines that you should  watch for. Contact a health care provider if you:  Think you are having a reaction to a medicine you are taking.  Have headaches that keep coming back (recurring).  Feel dizzy.  Have swelling in your ankles.  Have trouble with your vision. Get help right away if you:  Develop a severe headache or confusion.  Have unusual weakness or numbness.  Feel faint.  Have severe pain in your chest or abdomen.  Vomit repeatedly.  Have trouble breathing. Summary  Hypertension is when the force of blood pumping through your arteries is too strong. If this condition is not controlled, it may put you at risk for serious complications.  Your personal target blood pressure may vary depending on your medical conditions, your age, and other factors. For most people, a normal blood pressure is less than 120/80.  Hypertension is treated with lifestyle changes, medicines, or a combination of both. Lifestyle changes include losing weight, eating a healthy, low-sodium diet, exercising more, and limiting alcohol. This information is not intended to replace advice given  to you by your health care provider. Make sure you discuss any questions you have with your health care provider. Document Revised: 06/30/2018 Document Reviewed: 06/30/2018 Elsevier Patient Education  [place] A low-purine eating plan involves making food choices to limit your intake of purine. Purine is a kind of uric acid. Too much uric acid in your blood can cause certain conditions, such as gout and kidney stones. Eating a low-purine diet can help control these conditions. What are tips for following this plan? Reading food labels   Avoid foods with saturated or Trans fat.  Check the ingredient list of grains-based foods, such as bread and cereal, to make sure that they contain whole grains.  Check the ingredient list of sauces or soups to make sure they do not contain meat or fish.  When choosing soft drinks, check the ingredient list to make sure they do not contain high-fructose corn syrup. Shopping  Buy plenty of fresh fruits and vegetables.  Avoid buying canned or fresh fish.  Buy dairy products labeled as low-fat or nonfat.  Avoid buying premade or processed foods. These foods are often high in fat, salt (sodium), and added sugar. Cooking  Use olive oil instead of butter when cooking. Oils like olive oil, canola oil, and sunflower oil contain healthy fats. Meal planning  Learn which foods do or do not affect you. If you find out that a food tends to cause your gout symptoms to flare up, avoid eating that food. You can enjoy foods that do not cause problems. If you have any questions about a food item, talk with your dietitian or health care provider.  Limit foods high in fat, especially saturated fat. Fat makes it harder for your body to get rid of uric acid.  Choose foods that are lower in fat and are lean sources of protein. General guidelines  Limit alcohol intake to no more than 1 drink a day for nonpregnant women and 2 drinks a  day for men. One drink equals 12 oz of beer, 5 oz of wine, or 1 oz of hard liquor. Alcohol can affect the way your body gets rid of uric acid.  Drink plenty of water to keep your urine clear or pale yellow. Fluids can help remove uric acid from your body.  If directed by your health care provider, take a vitamin C supplement.  Work with your health care provider  and dietitian to develop a plan to achieve or maintain a healthy weight. Losing weight can help reduce uric acid in your blood. What foods are recommended? The items listed may not be a complete list. Talk with your dietitian about what dietary choices are best for you. Foods low in purines Foods low in purines do not need to be limited. These include:  All fruits.  All low-purine vegetables, pickles, and olives.  Breads, pasta, rice, cornbread, and popcorn. Cake and other baked goods.  All dairy foods.  Eggs, nuts, and nut butters.  Spices and condiments, such as salt, herbs, and vinegar.  Plant oils, butter, and margarine.  Water, sugar-free soft drinks, tea, coffee, and cocoa.  Vegetable-based soups, broths, sauces, and gravies. Foods moderate in purines Foods moderate in purines should be limited to the amounts listed.   cup of asparagus, cauliflower, spinach, mushrooms, or green peas, each day.  2/3 cup uncooked oatmeal, each day.   cup dry wheat bran or wheat germ, each day.  2-3 ounces of meat or poultry, each day.  4-6 ounces of shellfish, such as crab, lobster, oysters, or shrimp, each day.  1 cup cooked beans, peas, or lentils, each day.  Soup, broths, or bouillon made from meat or fish. Limit these foods as much as possible. What foods are not recommended? The items listed may not be a complete list. Talk with your dietitian about what dietary choices are best for you. Limit your intake of foods high in purines, including:  Beer and other alcohol.  Meat-based gravy or sauce.  Canned or fresh  fish, such as: ? Anchovies, sardines, herring, and tuna. ? Mussels and scallops. ? Codfish, trout, and haddock.  Berniece Salines.  Organ meats, such as: ? Liver or kidney. ? Tripe. ? Sweetbreads (thymus gland or pancreas).  Wild Clinical biochemist.  Yeast or yeast extract supplements.  Drinks sweetened with high-fructose corn syrup. Summary  Eating a low-purine diet can help control conditions caused by too much uric acid in the body, such as gout or kidney stones.  Choose low-purine foods, limit alcohol, and limit foods high in fat.  You will learn over time which foods do or do not affect you. If you find out that a food tends to cause your gout symptoms to flare up, avoid eating that food. This information is not intended to replace advice given to you by your health care provider. Make sure you discuss any questions you have with your health care provider. Document Revised: 10/02/2017 Document Reviewed: 12/03/2016 Elsevier Patient Education  2020 [employer].

## 2020-10-04 NOTE — Progress Notes (Signed)
Chronic Care Management Pharmacy  Name: Daniel Durham MRN: [id number]   DOB: January 07, 1966  Chief Complaint/ HPI Daniel Durham, 54 y.o., male, presents for their initial CCM visit with the clinical pharmacist via telephone due to COVID-19 pandemic .  PCP : Brunetta Jeans, PA-C  Office Visits:  04/23/2020 (PCP): Acute gout. No prior event.   Consult Visit: 07/18/2020 (Dr Martha Clan): chronic pain syndrome, only able to walk for 5 min, movement limited by pain. Hospitalized 6 weeks for COVID in December.  Patient Active Problem List   Diagnosis Date Noted  . Pain of toe of left foot 04/16/2020  . Closed fracture of lateral malleolus 03/08/2020  . Closed left ankle fracture 02/16/2020  . Ankle fracture, bimalleolar, closed, left, initial encounter 02/15/2020  . Morbid obesity ([place]) 12/27/2019  . HTN (hypertension) 11/18/2019  . Colon cancer screening 05/25/2018  . Anxiety and depression 03/03/2017  . Cervical disc disorder with radiculopathy of cervical region 07/24/2015  . 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 Visit for preventive health examination 02/04/2015  . Chronic back pain greater than 3 months duration 01/23/2015  . 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 Amputation stump complication ([place]) 31/49/7026   Past Surgical History:  Procedure Laterality Date  . BACK SURGERY    . CARDIAC CATHETERIZATION     11/05/10 [facility] [place], [place]): [place] of mLAD, no sign disease. EF 45% in RAO, 60% LAO (51% stress, 54% rest by NM stress; 57-59% echo 10/2010)  . CARPAL TUNNEL RELEASE     Bilateral  . CERVICAL DISCECTOMY  12/17/2017   titanium plates 12-17-17  . COLONOSCOPY    . HAND AMPUTATION  2007  . ORIF ANKLE FRACTURE Left 02/23/2020   Procedure: OPEN REDUCTION INTERNAL FIXATION (ORIF) ANKLE FRACTURE;  Surgeon: Nicholes Stairs, MD;  Location: [place];  Service:  Orthopedics;  Laterality: Left;  . POLYPECTOMY     pt states had colon in [place] and he had polyps- he has no idea what type   . ROTATOR CUFF REPAIR     Left  . SPINAL CORD STIMULATOR IMPLANT    . SPINAL CORD STIMULATOR REMOVAL N/A 10/26/2015   Procedure: LUMBAR SPINAL CORD STIMULATOR REMOVAL;  Surgeon: Clydell Hakim, MD;  Location: [place] [facility];  Service: Neurosurgery;  Laterality: N/A;   Family History  Problem Relation Age of Onset  . Diabetes Mother        Living  . Sleep apnea Mother   . Diabetes Father 77       Deceased  . Hypertension Father   . Jaundice Father   . Hemophilia Father   . Alcoholism Father   . Cancer Maternal Grandfather        Throat  . Esophageal cancer Maternal Grandfather   . Cancer Paternal Uncle        Throat  . Esophageal cancer Paternal Uncle   . Asthma Sister   . Diabetes Sister   . Healthy Son        X1  . Healthy Daughter        X1  . Colon polyps Neg Hx   . Colon cancer Neg Hx   . Rectal cancer Neg Hx   . Stomach cancer Neg Hx    Allergies  Allergen Reactions  . Aspirin Swelling  . Hydrocodone Itching   Outpatient Encounter Medications as of 10/04/2020  Medication Sig  . allopurinol (ZYLOPRIM)  100 MG tablet Take 1 tablet (100 mg total) by mouth daily.  Marland Kitchen ALPRAZolam (XANAX) 0.25 MG tablet Take 1 tablet (0.25 mg total) by mouth 2 (two) times daily as needed for anxiety.  Marland Kitchen atorvastatin (LIPITOR) 10 MG tablet TAKE 1 TABLET(10 MG) BY MOUTH DAILY (Patient taking differently: Take 10 mg by mouth daily. )  . cyclobenzaprine (FLEXERIL) 10 MG tablet Take 1 tablet (10 mg total) by mouth at bedtime. (Patient taking differently: Take 10 mg by mouth at bedtime as needed for muscle spasms. )  . gabapentin (NEURONTIN) 300 MG capsule Take 1 capsule each morning and midday along with your 600 mg dose to make a total of 900 mg (Patient taking differently: Take 300 mg by mouth daily. Taking daily at noon along with 652m = 9035m  . gabapentin  (NEURONTIN) 600 MG tablet TAKE 1 TABLET(600 MG) BY MOUTH THREE TIMES DAILY  . lisinopril (ZESTRIL) 20 MG tablet TAKE 1 TABLET(20 MG) BY MOUTH DAILY  . montelukast (SINGULAIR) 10 MG tablet Take 1 tablet (10 mg total) by mouth daily.  . nortriptyline (PAMELOR) 10 MG capsule Take 1 capsule (10 mg total) by mouth at bedtime.  . sertraline (ZOLOFT) 50 MG tablet Take 1 tablet (50 mg total) by mouth daily. (Patient taking differently: Take 50 mg by mouth at bedtime. )  . albuterol (VENTOLIN HFA) 108 (90 Base) MCG/ACT inhaler Inhale 1-2 puffs into the lungs every 6 (six) hours as needed for wheezing or shortness of breath.  . Marland Kitchenpratropium-albuterol (DUONEB) 0.5-2.5 (3) MG/3ML SOLN Take 3 mLs by nebulization every 8 (eight) hours as needed (shortness of breath, wheezing).  . Marland KitchenxyCODONE-acetaminophen (PERCOCET) 10-325 MG tablet Take 1 tablet by mouth every 6 (six) hours as needed for pain.  . [DISCONTINUED] terbinafine (LAMISIL) 250 MG tablet Take 1 tablet (250 mg total) by mouth daily.  . [DISCONTINUED] tiZANidine (ZANAFLEX) 2 MG tablet Take 2 mg by mouth daily as needed for muscle spasms.    No facility-administered encounter medications on file as of 10/04/2020.   Patient Care Team    Relationship Specialty Notifications Start End  MaBrunetta JeansPAVermontCP - General Physician Assistant  01/23/15   HaClydell HakimMD Consulting Physician Anesthesiology  01/08/17   JeNewman PiesMD Consulting Physician Neurosurgery  01/08/17   XuLeandrew KoyanagiMD Attending Physician Orthopedic Surgery  12/14/17   PoMadelin RearRPRoanoke Surgery Center LPharmacist Pharmacist  08/01/20    Comment: [phone] Current Diagnosis/Assessment: Goals Addressed            This Visit's Progress   . PharmD Care Plan       CARE PLAN ENTRY (see longitudinal plan of care for additional care plan information)  Current Barriers:  . Chronic Disease Management support, education, and care coordination needs related to Hypertension, Hyperlipidemia,  and Asthma   Hypertension BP Readings from Last 3 Encounters:  10/03/20 (!) 160/97  08/29/20 105/79  08/15/20 140/85   . Pharmacist Clinical Goal(s): o Over the next 90 days, patient will work with PharmD and providers to achieve BP goal <140/90 . Current regimen:  o Lisinopril 20 mg once daily . Interventions: o Reviewed recommendations for home monitoring. o Discussed exercise - maintain exercise through being active outside. . Patient self care activities - Over the next 90 days, patient will: o Check BP at different times throughout day, document, and provide at future appointments o Ensure daily salt intake < 2300 mg/day  Hyperlipidemia Lab Results  Component Value Date/Time  [place] 148 (H) 09/21/2019 09:57 AM   . Pharmacist Clinical Goal(s): o Over the next 90 days, patient will work with PharmD and providers to achieve LDL goal < 100 . Current regimen:  o Atorvastatin 10 mg once daily  . Interventions: o Reviewed cardiovascular risk benefits of atorvastatin to help prevent heart attack and stroke. . Patient self care activities - Over the next 90 days, patient will: o Resume prescription with atorvastatin 10 mg once daily  Asthma . Pharmacist Clinical Goal(s) o Over the next 365 days, patient will work with PharmD and providers to ensure medication accessibility and safety . Current regimen:  o Duoneb as needed o Albuterol rescue inhaler as needed . Interventions: o Discussed patient assistance - agreed to pursue as appropriate for rescue/maintenance inhalers . Patient self care activities - Over the next 365 days, patient will: o Reach out to pharmacist with any questions  Medication management . Pharmacist Clinical Goal(s): o Over the next 365 days, patient will work with PharmD and providers to achieve optimal medication adherence . Current pharmacy: [facility] . Interventions o Comprehensive medication review performed. o Continue  current medication management strategy . Patient self care activities - Over the next 365 days, patient will: o Take medications as prescribed o Report any questions or concerns to PharmD and/or provider(s) Initial goal documentation.      Hypertension   BP goal <140/90 over next three months  BP Readings from Last 3 Encounters:  10/03/20 (!) 160/97  08/29/20 105/79  08/15/20 140/85   Patient checks BP at home. Reports variability in BP, some readings 983J+ systolic.   Denies dizziness or side effects. Chronic pain. Patient is currently on the following medications:  . Lisinopril 20 mg once daily  Reviewed recommendations for home monitoring. Discussed exercise - maintain exercise through being active outside.  Plan  Continue current medications. BP call 2 weeks  Hyperlipidemia   LDL goal < 70  Lipid Panel     Component Value Date/Time   CHOL 241 (H) 09/21/2019 0957   TRIG 290 (H) 11/17/2019 1750   HDL 56.30 09/21/2019 0957   LDLCALC 148 (H) 09/21/2019 0957    Hepatic Function Latest Ref Rng & Units 04/23/2020 03/21/2020 02/28/2020  Total Protein 6.0 - 8.3 g/dL 7.1 6.8 7.1  Albumin 3.5 - 5.2 g/dL 4.4 4.2 3.6  AST 0 - 37 U/L 25 29 40  ALT 0 - 53 U/L 37 41 54(H)  Alk Phosphatase 39 - 117 U/L 83 90 83  Total Bilirubin 0.2 - 1.2 mg/dL 0.7 0.4 0.5  Bilirubin, Direct 0.0 - 0.2 mg/dL - - -    Current Rx is atorvastatin, reports not taking recently. Patient is currently not goal the following medications:  . Atorvastatin 10 mg once daily   Reviewed cardiovascular risk benefits of atorvastatin to help prevent heart attack and stroke.  Plan  Agreed to renew atorvastatin, will request to be sent through [facility] mail order. Continue current medications.   Hx of Asthma    Using maintenance inhaler regularly? No. Frequency of rescue inhaler use:  multiple times per day Patient is currently on the following medications:   Ipratropium-albuterol (DUONEB) 0.5-2.5 mg/mL - 3  mLs by nebulization every 8 hours as needed  Montelukast 10 mg once daily . Albuterol rescue inhaler as needed  Reviewed potential options for patient assistance.  Plan  Continue current medications. Albuterol pap.  Depression and anxiety    PHQ9 SCORE ONLY 07/18/2020 02/06/2020 09/21/2019  PHQ-9 Total Score 3 4 0   Denies side effects, reports ongoing benefit of taking medication. Patient is currently controlled on the following medications:  . Sertraline 50 mg once daily  . Alprazolam 0.25 mg twice daily   Plan  Continue current medications.  Gout   Reports continued gout pain in big toe. Not currently using allopurinol, requesting renewal.  Patient is currently uncontrolled on the following medications:  . Allopurinol 100 mg once daily  We discussed:  Low purine diet - handout provided.  Plan  Continue current medications.  Vaccines   Immunization History  Administered Date(s) Administered  . Influenza Inj Mdck Quad With Preservative 09/19/2017  . Influenza Split 09/10/2015  . Influenza,inj,Quad PF,6+ Mos 08/11/2016, 08/06/2017, 06/08/2018, 08/31/2019  . Influenza-Unspecified 09/23/2017  . Tdap 01/31/2015   Declined flu and covid vaccine for time being.   Plan  Recommended patient receive annual flu shot and covid vaccines if wanting to reconsider.   Medication Management / Care Coordination   Receives prescription medications from:  [facility], [address] [place] [address] Phone: [phone] Fax: [phone]  [facility] - [place], [address] [place] [address] Phone: [phone] Fax: [phone]   Requesting following maintenance medications be sent to [facility] -allopurinol 100 mg once daily -atorvastatin 10 mg once daily -lisinopril 20 mg once daily -montelukast 10 mg once daily  -sertraline 50 mg once  daily  Would like to pursue PAP for rescue inhaler - proair respiclick   Plan  Continue current medication management strategy. ___________________________ SDOH (Social Determinants of Health) assessments performed: Yes.  Future Appointments  Date Time Provider [place]  10/05/2020 10:45 AM Kirsteins, Luanna Salk, MD CPR-[facility] CPR  11/06/2020  9:20 AM Raulkar, Clide Deutscher, MD [facility] CPR   Visit follow-up:  . CPA follow-up: 2 week BP call. PAP for proair respiclick. Schedule 6 month [place] telephone f/u visit . RPH follow-up: 6 month telephone visit. RPH to discuss alternative inhalers with provider.  Madelin Rear, Pharm.D., BCGP Clinical Pharmacist [facility] [phone]

## 2020-10-05 ENCOUNTER — Other Ambulatory Visit: Payer: Self-pay | Admitting: Physician Assistant

## 2020-10-05 ENCOUNTER — Ambulatory Visit: Payer: Medicare HMO | Admitting: Physical Medicine & Rehabilitation

## 2020-10-09 ENCOUNTER — Telehealth: Payer: Self-pay | Admitting: Emergency Medicine

## 2020-10-09 DIAGNOSIS — J4530 Mild persistent asthma, uncomplicated: Secondary | ICD-10-CM

## 2020-10-09 DIAGNOSIS — F32A Depression, unspecified: Secondary | ICD-10-CM

## 2020-10-09 DIAGNOSIS — E785 Hyperlipidemia, unspecified: Secondary | ICD-10-CM

## 2020-10-09 DIAGNOSIS — I10 Essential (primary) hypertension: Secondary | ICD-10-CM

## 2020-10-09 DIAGNOSIS — F419 Anxiety disorder, unspecified: Secondary | ICD-10-CM

## 2020-10-09 DIAGNOSIS — M109 Gout, unspecified: Secondary | ICD-10-CM

## 2020-10-09 DIAGNOSIS — E782 Mixed hyperlipidemia: Secondary | ICD-10-CM

## 2020-10-09 MED ORDER — MONTELUKAST SODIUM 10 MG PO TABS: 10.0000 mg | ORAL_TABLET | Freq: Every day | ORAL | 1 refills | Status: AC

## 2020-10-09 MED ORDER — LISINOPRIL 20 MG PO TABS: 20.0000 mg | ORAL_TABLET | Freq: Every day | ORAL | 1 refills | Status: AC

## 2020-10-09 MED ORDER — ATORVASTATIN CALCIUM 10 MG PO TABS: ORAL_TABLET | ORAL | 1 refills | Status: DC

## 2020-10-09 MED ORDER — ALLOPURINOL 100 MG PO TABS: 100.0000 mg | ORAL_TABLET | Freq: Every day | ORAL | 1 refills | Status: AC

## 2020-10-09 MED ORDER — SERTRALINE HCL 50 MG PO TABS: 50.0000 mg | ORAL_TABLET | Freq: Every day | ORAL | 1 refills | Status: DC

## 2020-10-09 NOTE — Telephone Encounter (Signed)
Rx sent to the preferred patient pharmacy  

## 2020-10-09 NOTE — Telephone Encounter (Signed)
-----   Message from Madelin Rear, [facility] sent at 10/04/2020  1:55 PM EST ----- Hi Daniel Durham,Patient is asking for the following meds to be sent to [facility] mail order pharmacy due to lower cost. Would you be able to send these in when you have a chance please?-allopurinol 100 mg once daily-atorvastatin 10 mg once daily-lisinopril 20 mg once daily-montelukast 10 mg once daily -sertraline 50 mg once dailyThanks!Edison Nasuti

## 2020-10-17 ENCOUNTER — Other Ambulatory Visit: Payer: Self-pay | Admitting: Physician Assistant

## 2020-10-17 DIAGNOSIS — J4530 Mild persistent asthma, uncomplicated: Secondary | ICD-10-CM

## 2020-10-19 ENCOUNTER — Other Ambulatory Visit: Payer: Self-pay | Admitting: Physical Medicine and Rehabilitation

## 2020-10-19 ENCOUNTER — Other Ambulatory Visit: Payer: Self-pay | Admitting: Emergency Medicine

## 2020-10-19 ENCOUNTER — Other Ambulatory Visit: Payer: Self-pay | Admitting: Physician Assistant

## 2020-10-19 ENCOUNTER — Telehealth: Payer: Self-pay

## 2020-10-19 DIAGNOSIS — M5010 Cervical disc disorder with radiculopathy, unspecified cervical region: Secondary | ICD-10-CM

## 2020-10-19 DIAGNOSIS — G8929 Other chronic pain: Secondary | ICD-10-CM

## 2020-10-19 DIAGNOSIS — F32A Depression, unspecified: Secondary | ICD-10-CM

## 2020-10-19 DIAGNOSIS — M549 Dorsalgia, unspecified: Secondary | ICD-10-CM

## 2020-10-19 DIAGNOSIS — J4530 Mild persistent asthma, uncomplicated: Secondary | ICD-10-CM

## 2020-10-19 MED ORDER — ALPRAZOLAM 0.25 MG PO TABS: 0.2500 mg | ORAL_TABLET | Freq: Two times a day (BID) | ORAL | 0 refills | Status: DC | PRN

## 2020-10-19 MED ORDER — OXYCODONE-ACETAMINOPHEN 10-325 MG PO TABS: 1.0000 | ORAL_TABLET | Freq: Four times a day (QID) | ORAL | 0 refills | Status: DC | PRN

## 2020-10-19 NOTE — Telephone Encounter (Signed)
Xanax last rx 09/25/20 #30 LOV: 04/23/20 Gout CSC: 05/25/18

## 2020-10-19 NOTE — Progress Notes (Signed)
Chronic Care Management Pharmacy Assistant   Name: Daniel Durham  MRN: [id number] DOB: [birth year]  Reason for Encounter: Disease State  PCP : Daniel Jeans, PA-C  Allergies:   Allergies  Allergen Reactions  . Aspirin Swelling  . Hydrocodone Itching    Medications: Outpatient Encounter Medications as of 10/19/2020  Medication Sig  . albuterol (VENTOLIN HFA) 108 (90 Base) MCG/ACT inhaler Inhale 1-2 puffs into the lungs every 6 (six) hours as needed for wheezing or shortness of breath.  . allopurinol (ZYLOPRIM) 100 MG tablet Take 1 tablet (100 mg total) by mouth daily.  Marland Kitchen ALPRAZolam (XANAX) 0.25 MG tablet Take 1 tablet (0.25 mg total) by mouth 2 (two) times daily as needed for anxiety.  Marland Kitchen atorvastatin (LIPITOR) 10 MG tablet TAKE 1 TABLET(10 MG) BY MOUTH DAILY  . cyclobenzaprine (FLEXERIL) 10 MG tablet Take 1 tablet (10 mg total) by mouth at bedtime. (Patient taking differently: Take 10 mg by mouth at bedtime as needed for muscle spasms. )  . gabapentin (NEURONTIN) 300 MG capsule Take 1 capsule each morning and midday along with your 600 mg dose to make a total of 900 mg (Patient taking differently: Take 300 mg by mouth daily. Taking daily at noon along with 600mg  = 900mg )  . gabapentin (NEURONTIN) 600 MG tablet TAKE 1 TABLET(600 MG) BY MOUTH THREE TIMES DAILY  . ipratropium-albuterol (DUONEB) 0.5-2.5 (3) MG/3ML SOLN Take 3 mLs by nebulization every 8 (eight) hours as needed (shortness of breath, wheezing).  Marland Kitchen lisinopril (ZESTRIL) 20 MG tablet Take 1 tablet (20 mg total) by mouth daily.  . montelukast (SINGULAIR) 10 MG tablet Take 1 tablet (10 mg total) by mouth daily.  . nortriptyline (PAMELOR) 10 MG capsule Take 1 capsule (10 mg total) by mouth at bedtime.  Marland Kitchen oxyCODONE-acetaminophen (PERCOCET) 10-325 MG tablet Take 1 tablet by mouth every 6 (six) hours as needed for pain.  Marland Kitchen sertraline (ZOLOFT) 50 MG tablet Take 1 tablet (50 mg total) by mouth daily.   No facility-administered  encounter medications on file as of 10/19/2020.    Current Diagnosis: Patient Active Problem List   Diagnosis Date Noted  . Pain of toe of left foot 04/16/2020  . Closed fracture of lateral malleolus 03/08/2020  . Closed left ankle fracture 02/16/2020  . Ankle fracture, bimalleolar, closed, left, initial encounter 02/15/2020  . Morbid obesity ([place]) 12/27/2019  . HTN (hypertension) 11/18/2019  . Colon cancer screening 05/25/2018  . Anxiety and depression 03/03/2017  . Cervical disc disorder with radiculopathy of cervical region 07/24/2015  . 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 Visit for preventive health examination 02/04/2015  . Chronic back pain greater than 3 months duration 01/23/2015  . 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 Amputation stump complication ([place]) 95/18/8416    Reviewed chart prior to disease state call. Spoke with patient regarding BP  Recent Office Vitals: BP Readings from Last 3 Encounters:  10/03/20 (!) 160/97  08/29/20 105/79  08/15/20 140/85   Pulse Readings from Last 3 Encounters:  10/03/20 74  08/29/20 80  08/15/20 88    Wt Readings from Last 3 Encounters:  10/03/20 270 lb (122.5 kg)  08/29/20 267 lb 12.8 oz (121.5 kg)  08/15/20 270 lb (122.5 kg)     Kidney Function Lab Results  Component Value Date/Time   CREATININE 1.00 08/15/2020 02:59 PM   CREATININE 1.10 08/15/2020 02:08 PM  GFR 84.98 04/23/2020 02:45 PM   GFRNONAA >60 08/15/2020 12:26 PM   GFRAA >60 02/28/2020 04:04 AM    BMP Latest Ref Rng & Units 08/15/2020 08/15/2020 08/15/2020  Glucose 70 - 99 mg/dL 96 - 104(H)  BUN 6 - 20 mg/dL 8 - 9  Creatinine 0.61 - 1.24 mg/dL 1.00 1.10 1.22  Sodium 135 - 145 mmol/L 138 - 136  Potassium 3.5 - 5.1 mmol/L 4.0 - 6.2(H)  Chloride 98 - 111 mmol/L 104 - 103  CO2 22 - 32 mmol/L - - 21(L)  Calcium 8.9 - 10.3 mg/dL - - 8.7(L)     . Current antihypertensive regimen:                -    lisinopril (ZESTRIL) 20 MG tablet               -   allopurinol (ZYLOPRIM) 100 MG tablet . How often are you checking your Blood Pressure? infrequently . Current home BP readings: Patient stated he has not really checked his blood pressure as he should however when he has checked , his blood pressure was ranging between 110/90s. . What recent interventions/DTPs have been made by any provider to improve Blood Pressure control since last CPP Visit: None noted . Any recent hospitalizations or ED visits since last visit with CPP? No . What diet changes have been made to improve Blood Pressure Control?  o Patient stated he has been  eating and drinking normally . Marland Kitchen What exercise is being done to improve your Blood Pressure Control?  o Patient stated he is outside every day but does not exercise as much due to a broken ankle.   Adherence Review: Is the patient currently on ACE/ARB medication? No Does the patient have >5 day gap between last estimated fill dates? CPP to reequest   Patient stated he needed refills for :  Montelukast  Xanax   [name] ,[place] Pharmacist Assistant [phone]  Follow-Up:  Pharmacist Review

## 2020-11-06 ENCOUNTER — Encounter: Payer: Medicare HMO | Attending: Physical Medicine and Rehabilitation | Admitting: Physical Medicine and Rehabilitation

## 2020-11-06 ENCOUNTER — Encounter: Payer: Self-pay | Admitting: Physical Medicine and Rehabilitation

## 2020-11-06 ENCOUNTER — Other Ambulatory Visit: Payer: Self-pay

## 2020-11-06 DIAGNOSIS — R0981 Nasal congestion: Secondary | ICD-10-CM | POA: Diagnosis not present

## 2020-11-06 DIAGNOSIS — R059 Cough, unspecified: Secondary | ICD-10-CM | POA: Insufficient documentation

## 2020-11-06 DIAGNOSIS — M5416 Radiculopathy, lumbar region: Secondary | ICD-10-CM

## 2020-11-06 DIAGNOSIS — M549 Dorsalgia, unspecified: Secondary | ICD-10-CM | POA: Diagnosis not present

## 2020-11-06 DIAGNOSIS — G8929 Other chronic pain: Secondary | ICD-10-CM | POA: Diagnosis not present

## 2020-11-06 DIAGNOSIS — G4701 Insomnia due to medical condition: Secondary | ICD-10-CM | POA: Diagnosis not present

## 2020-11-06 DIAGNOSIS — M5010 Cervical disc disorder with radiculopathy, unspecified cervical region: Secondary | ICD-10-CM | POA: Diagnosis not present

## 2020-11-06 MED ORDER — NORTRIPTYLINE HCL 10 MG PO CAPS
20.0000 mg | ORAL_CAPSULE | Freq: Every day | ORAL | 1 refills | Status: DC
Start: 2020-11-06 — End: 2020-12-19

## 2020-11-06 MED ORDER — OXYCODONE-ACETAMINOPHEN 10-325 MG PO TABS: 1.0000 | ORAL_TABLET | Freq: Four times a day (QID) | ORAL | 0 refills | Status: AC | PRN

## 2020-11-06 NOTE — Progress Notes (Signed)
Subjective:    Patient ID: Kessler Reinbold, male    DOB: 26-Aug-1966, 55 y.o.   MRN: [id number]  HPI  Due to national recommendations of social distancing because of COVID 65, an audio/video tele-health visit is felt to be the most appropriate encounter for this patient at this time. See MyChart message from today for the patient's consent to a tele-health encounter with [facility]. This is a follow up tele-visit via phone due to black ice outside today. The patient is at home. MD is at office.   Mr. Shulman is a 55 year old pleasant man who presents for follow-up of lumbar spinal stenosis with neurogenic claudication.  His back pain, leg pain, and hip pain have worsened since last visit.   Reviewed MRI lumbar spine from November with him: IMPRESSION: 1. Progressive canal stenosis at L2-L3 (now mild to moderate) and L3-L4 (now moderate), which appears to be largely due to increasing dorsal epidural lipomatosis with superimposed small disc bulge at L3-L4. 2. No substantial change in postoperative changes at L5-S1 with suspected granulation tissue in the left lateral recess. No convincing stenosis at this level.  Next Thursday he has an epidural steroid injection scheduled with Dr. Letta Pate. Currently his right side is hurting worse.   His right leg feels numb and weak.   His wife will bring him to his appointment.   Prior history:  He has been spending time outside. He can go outside in shorts and T shirt as he is from [place]. He tries to spend as much time outdoors as possible and felt he has lost weight recently.  He got a lumbar spine MRI that shows the following: 1. Progressive canal stenosis at L2-L3 (now mild to moderate) and L3-L4 (now moderate), which appears to be largely due to increasing dorsal epidural lipomatosis with superimposed small disc bulge at L3-L4. 2. No substantial change in postoperative changes at L5-S1  with suspected granulation tissue in the left lateral recess. No convincing stenosis at this level.  In the last couple of weeks he has had a lot of nightmares. His wife notices that he is crying. This is not new for him, but it is happening more regularly. These have been preventing him from going back to sleep. He already does not sleep well.   He feels that the pain is inhibiting his ability to do his functional tasks.   Average pain is 7/10.    Pain Inventory Average Pain 7 Pain Right Now 5 My pain is sharp, stabbing, tingling and aching  In the last 24 hours, has pain interfered with the following? General activity 5 Relation with others 8 Enjoyment of life 8 What TIME of day is your pain at its worst? daytime and evening Sleep (in general) Poor  Pain is worse with: walking, bending, sitting, standing and some activites Pain improves with: rest and medication Relief from Meds: 8  Family History  Problem Relation Age of Onset  . Diabetes Mother        Living  . Sleep apnea Mother   . Diabetes Father 51       Deceased  . Hypertension Father   . Jaundice Father   . Hemophilia Father   . Alcoholism Father   . Cancer Maternal Grandfather        Throat  . Esophageal cancer Maternal Grandfather   . Cancer Paternal Uncle        Throat  . Esophageal cancer Paternal Uncle   .  Asthma Sister   . Diabetes Sister   . Healthy Son        X1  . Healthy Daughter        X1  . Colon polyps Neg Hx   . Colon cancer Neg Hx   . Rectal cancer Neg Hx   . Stomach cancer Neg Hx    Social History   Socioeconomic History  . Marital status: Divorced    Spouse name: Not on file  . Number of children: Not on file  . Years of education: Not on file  . Highest education level: Not on file  Occupational History  . Not on file  Tobacco Use  . Smoking status: Never Smoker  . Smokeless tobacco: Never Used  Vaping Use  . Vaping Use: Never used  Substance and Sexual Activity  .  Alcohol use: Yes    Alcohol/week: 0.0 standard drinks    Comment: occ  . Drug use: No  . Sexual activity: Yes  Other Topics Concern  . Not on file  Social History Narrative  . Not on file   Social Determinants of Health   Financial Resource Strain: Not on file  Food Insecurity: No Food Insecurity  . Worried About Charity fundraiser in the Last Year: Never true  . Ran Out of Food in the Last Year: Never true  Transportation Needs: No Transportation Needs  . Lack of Transportation (Medical): No  . Lack of Transportation (Non-Medical): No  Physical Activity: Not on file  Stress: Not on file  Social Connections: Not on file   Past Surgical History:  Procedure Laterality Date  . BACK SURGERY    . CARDIAC CATHETERIZATION     11/05/10 [facility], [place]): Briding of mLAD, no sign disease. EF 45% in RAO, 60% LAO (51% stress, 54% rest by NM stress; 57-59% echo 10/2010)  . CARPAL TUNNEL RELEASE     Bilateral  . CERVICAL DISCECTOMY  12/17/2017   titanium plates 12-17-17  . COLONOSCOPY    . HAND AMPUTATION  2007  . ORIF ANKLE FRACTURE Left 02/23/2020   Procedure: OPEN REDUCTION INTERNAL FIXATION (ORIF) ANKLE FRACTURE;  Surgeon: Nicholes Stairs, MD;  Location: [place];  Service: Orthopedics;  Laterality: Left;  . POLYPECTOMY     pt states had colon in [place] and he had polyps- he has no idea what type   . ROTATOR CUFF REPAIR     Left  . SPINAL CORD STIMULATOR IMPLANT    . SPINAL CORD STIMULATOR REMOVAL N/A 10/26/2015   Procedure: LUMBAR SPINAL CORD STIMULATOR REMOVAL;  Surgeon: Clydell Hakim, MD;  Location: [place] [facility];  Service: Neurosurgery;  Laterality: N/A;   Past Surgical History:  Procedure Laterality Date  . BACK SURGERY    . CARDIAC CATHETERIZATION     11/05/10 [facility], [place]): Briding of mLAD, no sign disease. EF 45% in RAO, 60% LAO (51% stress, 54% rest by NM stress; 57-59% echo 10/2010)  . CARPAL TUNNEL RELEASE     Bilateral  . CERVICAL DISCECTOMY   12/17/2017   titanium plates 12-17-17  . COLONOSCOPY    . HAND AMPUTATION  2007  . ORIF ANKLE FRACTURE Left 02/23/2020   Procedure: OPEN REDUCTION INTERNAL FIXATION (ORIF) ANKLE FRACTURE;  Surgeon: Nicholes Stairs, MD;  Location: [facility];  Service: Orthopedics;  Laterality: Left;  . POLYPECTOMY     pt states had colon in [place] and he had polyps- he has no idea what type   .  ROTATOR CUFF REPAIR     Left  . SPINAL CORD STIMULATOR IMPLANT    . SPINAL CORD STIMULATOR REMOVAL N/A 10/26/2015   Procedure: LUMBAR SPINAL CORD STIMULATOR REMOVAL;  Surgeon: Clydell Hakim, MD;  Location: [place] [facility];  Service: Neurosurgery;  Laterality: N/A;   Past Medical History:  Diagnosis Date  . Allergy   . Anxiety   . Asthma    SEASONAL   . Blood transfusion without reported diagnosis   . Chronic back pain    Neurostimulator  . Depression   . Environmental allergies   . Hypertension   . Neuromuscular disorder ([place])   . Neuropathy   . Seasonal allergic conjunctivitis    There were no vitals taken for this visit.  Opioid Risk Score:   Fall Risk Score:  `1  Depression screen PHQ 2/9  Depression screen [facility] 2/9 07/18/2020 02/06/2020 09/21/2019 06/08/2019 03/16/2019 02/15/2019 09/09/2018  Decreased Interest 0 1 0 0 0 0 0  Down, Depressed, Hopeless 0 1 0 0 0 0 1  PHQ - 2 Score 0 2 0 0 0 0 1  Altered sleeping 2 1 0 0 - 0 1  Tired, decreased energy 0 1 0 0 - 0 0  Change in appetite 0 0 0 0 - 0 0  Feeling bad or failure about yourself  0 0 0 0 - 0 0  Trouble concentrating 1 0 0 0 - 0 0  Moving slowly or fidgety/restless 0 0 0 0 - 0 0  Suicidal thoughts 0 0 0 0 - 0 0  PHQ-9 Score 3 4 0 0 - 0 2  Difficult doing work/chores Not difficult at all Somewhat difficult Not difficult at all Not difficult at all - Not difficult at all Somewhat difficult  Some recent data might be hidden    Review of Systems  Musculoskeletal: Positive for back pain and neck pain.       Shoulder pain Wrist pain   All other  systems reviewed and are negative.      Objective:   Physical Exam Not performed as patient was seen via phone.      Assessment & Plan:  Mrs. Sammon is a 10 yar old man who presents for follow-up of the following issues:  1) Chronic Pain Syndrome secondary lumbar spinal stenosis with neurogenic claudication, currently worse on the right side.  -Discussed current symptoms of pain and history of pain.  -Discussed benefits of exercise in reducing pain. -Discussed following foods that may reduce pain: 1) Ginger 2) Blueberries 3) Salmon 4) Pumpkin seeds 5) dark chocolate 6) turmeric 7) tart cherries 8) virgin olive oil 9) chilli peppers 10) mint 11) red wine -Reviewed results from lumbar spine MRI:  IMPRESSION: 1. Progressive canal stenosis at L2-L3 (now mild to moderate) and L3-L4 (now moderate), which appears to be largely due to increasing dorsal epidural lipomatosis with superimposed small disc bulge at L3-L4. 2. No substantial change in postoperative changes at L5-S1 with suspected granulation tissue in the left lateral recess. No convincing stenosis at this level.  -The patient has low back pain; lower extremity pain; paresthesias; increased pain with movement changes; lower extremity weakness; and gait disturbance. The patient has no bowel or bladder dysfunction or saddle anesthesia. The patient has failed months of conservative care with physical therapy and HEP, medications, activity and lifestyle modification. MRI was performed and discussed above. The patient has had multiple ESIs before with last 2 years ago lasting 2 months.   -He is  interested in epidural steroid injections. Will schedule with Dr. Wynn Banker for bilateral L2-L3 and L3-L4 ESI. Advised that he will need a driver. ADDENDUM: Discussed with Dr. Wynn Banker- given current symptoms worst on right side, please change procedure to right side L2-L3 and L3-L4 ESI.  -Refilled Percocet 10mg  up to 4 times per day  ( ) with release date of Jan 17th- one month from last. He has been using medication responsibly and it is providing relief. Given that pain is worse today, did not discuss lowering dose but hopefully we can consider after ESI.    2) Nightmares secondary to PTSD: -Increase Nortriptyline to 20mg  HS -Has helped with the nightmares, and hopefully will help with the sleep as well.  -Advised regarding risk of serotonin syndrome with sertraline- he will take latter in the morning.   3) Cough: -Recommended hot water, ginger, and honey. Advised that the ginger and turmeric also have anti-inflammatory properties that can help with pain.   -Has no fever -OTC medications are not helping.  4) Sinus congestion: -gets some relief from medication -hot shower.  -advised regarding steam inhalation.   15 minutes spent in discussion of cough, sinus congestion, lower back pain with neurogenic claudication, ESI next week, communicating with staff to switch side of injection given that current symptoms are worse on right side (last visit they were equal bilaterally), encouraging continued movement, discussing pain medication and his use- providing refill 1/17 one month from last fill), encouraging ginger and turmeric for their pain relieving and anti-inflammatory properties.

## 2020-11-08 DIAGNOSIS — R52 Pain, unspecified: Secondary | ICD-10-CM | POA: Diagnosis not present

## 2020-11-08 DIAGNOSIS — I517 Cardiomegaly: Secondary | ICD-10-CM | POA: Diagnosis not present

## 2020-11-08 DIAGNOSIS — R0902 Hypoxemia: Secondary | ICD-10-CM | POA: Diagnosis not present

## 2020-11-08 DIAGNOSIS — R0602 Shortness of breath: Secondary | ICD-10-CM | POA: Diagnosis not present

## 2020-11-08 DIAGNOSIS — Z20822 Contact with and (suspected) exposure to covid-19: Secondary | ICD-10-CM | POA: Diagnosis not present

## 2020-11-08 DIAGNOSIS — J9811 Atelectasis: Secondary | ICD-10-CM | POA: Diagnosis not present

## 2020-11-08 DIAGNOSIS — R0689 Other abnormalities of breathing: Secondary | ICD-10-CM | POA: Diagnosis not present

## 2020-11-08 DIAGNOSIS — R059 Cough, unspecified: Secondary | ICD-10-CM | POA: Diagnosis not present

## 2020-11-08 DIAGNOSIS — I10 Essential (primary) hypertension: Secondary | ICD-10-CM | POA: Diagnosis not present

## 2020-11-08 DIAGNOSIS — Z6841 Body Mass Index (BMI) 40.0 and over, adult: Secondary | ICD-10-CM | POA: Diagnosis not present

## 2020-11-08 DIAGNOSIS — J069 Acute upper respiratory infection, unspecified: Secondary | ICD-10-CM | POA: Diagnosis not present

## 2020-11-08 DIAGNOSIS — G4733 Obstructive sleep apnea (adult) (pediatric): Secondary | ICD-10-CM | POA: Diagnosis not present

## 2020-11-08 DIAGNOSIS — R000 Tachycardia, unspecified: Secondary | ICD-10-CM | POA: Diagnosis not present

## 2020-11-08 DIAGNOSIS — J9621 Acute and chronic respiratory failure with hypoxia: Secondary | ICD-10-CM | POA: Diagnosis not present

## 2020-11-08 DIAGNOSIS — J209 Acute bronchitis, unspecified: Secondary | ICD-10-CM | POA: Diagnosis not present

## 2020-11-08 DIAGNOSIS — J040 Acute laryngitis: Secondary | ICD-10-CM | POA: Diagnosis not present

## 2020-11-09 DIAGNOSIS — R000 Tachycardia, unspecified: Secondary | ICD-10-CM | POA: Diagnosis not present

## 2020-11-10 ENCOUNTER — Encounter: Payer: Self-pay | Admitting: Physician Assistant

## 2020-11-14 ENCOUNTER — Other Ambulatory Visit: Payer: Self-pay | Admitting: Physician Assistant

## 2020-11-14 DIAGNOSIS — F32A Depression, unspecified: Secondary | ICD-10-CM

## 2020-11-14 NOTE — Telephone Encounter (Signed)
Patient is currently in the hospital.

## 2020-11-15 ENCOUNTER — Encounter: Payer: Medicare HMO | Admitting: Physical Medicine & Rehabilitation

## 2020-11-19 ENCOUNTER — Other Ambulatory Visit: Payer: Self-pay | Admitting: Physician Assistant

## 2020-11-19 DIAGNOSIS — F32A Depression, unspecified: Secondary | ICD-10-CM

## 2020-11-19 NOTE — Telephone Encounter (Signed)
Patient is still in the hospital 

## 2020-11-21 ENCOUNTER — Ambulatory Visit: Payer: Medicare HMO | Admitting: Physician Assistant

## 2020-11-21 ENCOUNTER — Encounter: Payer: Self-pay | Admitting: Physician Assistant

## 2020-11-21 ENCOUNTER — Other Ambulatory Visit: Payer: Self-pay | Admitting: Physician Assistant

## 2020-11-21 DIAGNOSIS — F32A Depression, unspecified: Secondary | ICD-10-CM

## 2020-11-21 DIAGNOSIS — F419 Anxiety disorder, unspecified: Secondary | ICD-10-CM

## 2020-11-21 NOTE — Telephone Encounter (Signed)
Requesting:Xanax 0.25mg   Contract: UDS: Last Visit: Next Visit: Last Refill:  Please Advise

## 2020-11-21 NOTE — Telephone Encounter (Signed)
Requesting:Xanax 0.25mg  Contract: UDS: Last Visit:04/23/20 Next Visit:n/a Last Refill:10/19/20  Please Advise Patient was in the hospital as of 11/19/20

## 2020-11-22 ENCOUNTER — Encounter: Payer: Self-pay | Admitting: Physical Medicine and Rehabilitation

## 2020-11-28 ENCOUNTER — Other Ambulatory Visit: Payer: Self-pay

## 2020-11-29 ENCOUNTER — Ambulatory Visit (INDEPENDENT_AMBULATORY_CARE_PROVIDER_SITE_OTHER): Payer: Medicare HMO | Admitting: Physician Assistant

## 2020-11-29 VITALS — BP 130/85 | Resp 14

## 2020-11-29 DIAGNOSIS — J208 Acute bronchitis due to other specified organisms: Secondary | ICD-10-CM

## 2020-11-29 DIAGNOSIS — U099 Post covid-19 condition, unspecified: Secondary | ICD-10-CM

## 2020-11-29 DIAGNOSIS — J9601 Acute respiratory failure with hypoxia: Secondary | ICD-10-CM | POA: Diagnosis not present

## 2020-11-29 DIAGNOSIS — L03113 Cellulitis of right upper limb: Secondary | ICD-10-CM

## 2020-11-29 DIAGNOSIS — Z1159 Encounter for screening for other viral diseases: Secondary | ICD-10-CM

## 2020-11-29 DIAGNOSIS — D72829 Elevated white blood cell count, unspecified: Secondary | ICD-10-CM | POA: Diagnosis not present

## 2020-11-29 DIAGNOSIS — R0681 Apnea, not elsewhere classified: Secondary | ICD-10-CM | POA: Diagnosis not present

## 2020-11-29 DIAGNOSIS — R0609 Other forms of dyspnea: Secondary | ICD-10-CM

## 2020-11-29 DIAGNOSIS — Z23 Encounter for immunization: Secondary | ICD-10-CM

## 2020-11-29 DIAGNOSIS — B9689 Other specified bacterial agents as the cause of diseases classified elsewhere: Secondary | ICD-10-CM

## 2020-11-29 LAB — CBC WITH DIFFERENTIAL/PLATELET
Basophils Absolute: 0 10*3/uL (ref 0.0–0.1)
Basophils Relative: 0.5 % (ref 0.0–3.0)
Eosinophils Absolute: 0.2 10*3/uL (ref 0.0–0.7)
Eosinophils Relative: 2 % (ref 0.0–5.0)
HCT: 43.8 % (ref 39.0–52.0)
Hemoglobin: 15 g/dL (ref 13.0–17.0)
Lymphocytes Relative: 25.5 % (ref 12.0–46.0)
Lymphs Abs: 2 10*3/uL (ref 0.7–4.0)
MCHC: 34.3 g/dL (ref 30.0–36.0)
MCV: 98.4 fl (ref 78.0–100.0)
Monocytes Absolute: 0.7 10*3/uL (ref 0.1–1.0)
Monocytes Relative: 8.5 % (ref 3.0–12.0)
Neutro Abs: 5.1 10*3/uL (ref 1.4–7.7)
Neutrophils Relative %: 63.5 % (ref 43.0–77.0)
Platelets: 185 10*3/uL (ref 150.0–400.0)
RBC: 4.46 Mil/uL (ref 4.22–5.81)
RDW: 13.2 % (ref 11.5–15.5)
WBC: 8 10*3/uL (ref 4.0–10.5)

## 2020-11-29 LAB — COMPREHENSIVE METABOLIC PANEL
ALT: 79 U/L — ABNORMAL HIGH (ref 0–53)
AST: 26 U/L (ref 0–37)
Albumin: 3.7 g/dL (ref 3.5–5.2)
Alkaline Phosphatase: 80 U/L (ref 39–117)
BUN: 12 mg/dL (ref 6–23)
CO2: 25 mEq/L (ref 19–32)
Calcium: 8.6 mg/dL (ref 8.4–10.5)
Chloride: 106 mEq/L (ref 96–112)
Creatinine, Ser: 0.92 mg/dL (ref 0.40–1.50)
GFR: 94.87 mL/min (ref >60.00)
Glucose, Bld: 133 mg/dL — ABNORMAL HIGH (ref 70–99)
Potassium: 4 mEq/L (ref 3.5–5.1)
Sodium: 138 mEq/L (ref 135–145)
Total Bilirubin: 0.4 mg/dL (ref 0.2–1.2)
Total Protein: 6.2 g/dL (ref 6.0–8.3)

## 2020-11-29 LAB — TSH: TSH: 0.97 u[IU]/mL (ref 0.35–4.50)

## 2020-11-29 LAB — VITAMIN D 25 HYDROXY (VIT D DEFICIENCY, FRACTURES): VITD: 15.34 ng/mL — ABNORMAL LOW (ref 30.00–100.00)

## 2020-11-29 MED ORDER — PANTOPRAZOLE SODIUM 40 MG PO TBEC: 40.0000 mg | DELAYED_RELEASE_TABLET | Freq: Every day | ORAL | 3 refills | Status: DC

## 2020-11-29 NOTE — Progress Notes (Signed)
Patient presents to clinic today for hospital follow-up. Patient presented to the [facility] ER on 11/09/2020 with worsening URI symptoms and SOB despite treatment at home. He had tested + for COVID and was started on azithromycin giving concern for secondary bronchitis. He had really slow improvement and actual worsening of symptoms. Giving this in conjunction with chronic respiratory failure after prior COVID infection in early 2021, with ne hypoxia, patient was admitted to the hospital for further evaluation and management of symptoms. CXR negative for pneumonia. Patient started on IV steroids, Lasic and Omnicef. CTA obtained and negative for PE. Concern for sleep apnea noted due to nighttime desaturations. Daytime saturations stayed stable. O2 trial given and he did not qualify for home oxygen therapy. Was discharged on 11/22/2020 to follow-up with PCP and Pulmonology. OP sleep study also recommended. Of note, he was given 5-days of Omnicef to complete at home.   Since discharge, patient endorses trying to keep well-hydrated and get plenty of rest. Did complete course of Omnicef. Is continuing chronic medications as directed. Notes still having substantial fatigue. Windedness with moderate exertion but note oxygen levels stay in the upper 90s. Denies recurrence of chest congestion or productive cough. Is eating well. Notes good bowel/bladder output. Does not have appointment with Pulmonologist yet.    Would like to discuss influenza vaccine and COVID vaccination.   Past Medical History:  Diagnosis Date  . Allergy   . Anxiety   . Asthma    SEASONAL   . Blood transfusion without reported diagnosis   . Chronic back pain    Neurostimulator  . Depression   . Environmental allergies   . Hypertension   . Neuromuscular disorder ([place])   . Neuropathy   . Seasonal allergic conjunctivitis     Current Outpatient Medications on File Prior to Visit  Medication Sig Dispense Refill  . albuterol  (VENTOLIN HFA) 108 (90 Base) MCG/ACT inhaler Inhale 1-2 puffs into the lungs every 6 (six) hours as needed for wheezing or shortness of breath. 18 g 2  . allopurinol (ZYLOPRIM) 100 MG tablet Take 1 tablet (100 mg total) by mouth daily. 90 tablet 1  . ALPRAZolam (XANAX) 0.25 MG tablet Take 1 tablet (0.25 mg total) by mouth 2 (two) times daily as needed for anxiety. 30 tablet 0  . atorvastatin (LIPITOR) 10 MG tablet TAKE 1 TABLET(10 MG) BY MOUTH DAILY 90 tablet 1  . cyclobenzaprine (FLEXERIL) 10 MG tablet Take 1 tablet (10 mg total) by mouth at bedtime. (Patient taking differently: Take 10 mg by mouth at bedtime as needed for muscle spasms. ) 30 tablet 0  . gabapentin (NEURONTIN) 300 MG capsule Take 1 capsule each morning and midday along with your 600 mg dose to make a total of 900 mg (Patient taking differently: Take 300 mg by mouth daily. Taking daily at noon along with 600mg  = 900mg ) 60 capsule 3  . gabapentin (NEURONTIN) 600 MG tablet TAKE 1 TABLET(600 MG) BY MOUTH THREE TIMES DAILY 270 tablet 0  . ipratropium-albuterol (DUONEB) 0.5-2.5 (3) MG/3ML SOLN Take 3 mLs by nebulization every 8 (eight) hours as needed (shortness of breath, wheezing). 3 mL 11  . lisinopril (ZESTRIL) 20 MG tablet Take 1 tablet (20 mg total) by mouth daily. 90 tablet 1  . montelukast (SINGULAIR) 10 MG tablet Take 1 tablet (10 mg total) by mouth daily. 90 tablet 1  . nortriptyline (PAMELOR) 10 MG capsule Take 2 capsules (20 mg total) by mouth at bedtime. 60 capsule  1  . oxyCODONE-acetaminophen (PERCOCET) 10-325 MG tablet Take 1 tablet by mouth every 6 (six) hours as needed for pain. 120 tablet 0  . sertraline (ZOLOFT) 50 MG tablet Take 1 tablet (50 mg total) by mouth daily. 90 tablet 1   No current facility-administered medications on file prior to visit.    Allergies  Allergen Reactions  . Aspirin Swelling  . Hydrocodone Itching    Family History  Problem Relation Age of Onset  . Diabetes Mother        Living  .  Sleep apnea Mother   . Diabetes Father 61       Deceased  . Hypertension Father   . Jaundice Father   . Hemophilia Father   . Alcoholism Father   . Cancer Maternal Grandfather        Throat  . Esophageal cancer Maternal Grandfather   . Cancer Paternal Uncle        Throat  . Esophageal cancer Paternal Uncle   . Asthma Sister   . Diabetes Sister   . Healthy Son        X1  . Healthy Daughter        X1  . Colon polyps Neg Hx   . Colon cancer Neg Hx   . Rectal cancer Neg Hx   . Stomach cancer Neg Hx     Social History   Socioeconomic History  . Marital status: Divorced    Spouse name: Not on file  . Number of children: Not on file  . Years of education: Not on file  . Highest education level: Not on file  Occupational History  . Not on file  Tobacco Use  . Smoking status: Never Smoker  . Smokeless tobacco: Never Used  Vaping Use  . Vaping Use: Never used  Substance and Sexual Activity  . Alcohol use: Yes    Alcohol/week: 0.0 standard drinks    Comment: occ  . Drug use: No  . Sexual activity: Yes  Other Topics Concern  . Not on file  Social History Narrative  . Not on file   Social Determinants of Health   Financial Resource Strain: Not on file  Food Insecurity: No Food Insecurity  . Worried About Charity fundraiser in the Last Year: Never true  . Ran Out of Food in the Last Year: Never true  Transportation Needs: No Transportation Needs  . Lack of Transportation (Medical): No  . Lack of Transportation (Non-Medical): No  Physical Activity: Not on file  Stress: Not on file  Social Connections: Not on file   Review of Systems - See HPI.  All other ROS are negative.  There were no vitals taken for this visit.  Physical Exam Vitals reviewed.  Constitutional:      Appearance: He is well-developed.  HENT:     Head: Normocephalic and atraumatic.     Right Ear: Tympanic membrane normal.     Left Ear: Tympanic membrane normal.     Mouth/Throat:      Mouth: Mucous membranes are moist.  Cardiovascular:     Rate and Rhythm: Normal rate and regular rhythm.     Heart sounds: Normal heart sounds.  Pulmonary:     Effort: Pulmonary effort is normal.     Breath sounds: Normal breath sounds.  Abdominal:     General: Bowel sounds are normal.     Palpations: Abdomen is soft.  Neurological:     General: No focal deficit present.  Mental Status: He is alert.     Assessment/Plan: 1. Acute respiratory failure with hypoxia ([place]) 2. Acute bacterial bronchitis 3. Post-COVID chronic dyspnea Good oxygenation in office. Remains 98% with prolonged ambulation. Lungs CTAB. Has finished antibiotic and steroids. Clinically resolved bronchitis and aute rep failure. Will repeat labs today. Plan to get into Pulmonology for further evaluation and management regarding chronic dyspnea, potential sleep apnea, ETC. Strict ER precautions reviewed with patient who voiced understanding and agreement with plan.  - CBC with Differential/Platelet - Comprehensive metabolic panel - TSH - Vitamin D (25 hydroxy) - Ambulatory referral to Pulmonology  4. Apneic episode - Ambulatory referral to Pulmonology  5. Cellulitis of arm, right Resolved. No residual symptoms.   6. Leukocytosis, unspecified type Off of steroids. Repeat levels today.  - CBC with Differential/Platelet  7. Need for hepatitis C screening test - Hepatitis C Antibody  8. Need for immunization against influenza Flu shot updated today. Discussed COVID vaccination. He will get this scheduled.  - Flu Vaccine QUAD 36+ mos IM  This visit occurred during the SARS-CoV-2 public health emergency.  Safety protocols were in place, including screening questions prior to the visit, additional usage of staff PPE, and extensive cleaning of exam room while observing appropriate contact time as indicated for disinfecting solutions.     Leeanne Rio, PA-C

## 2020-11-29 NOTE — Patient Instructions (Signed)
Please go to the lab today for blood work.  I will call you with your results. We will alter treatment regimen(s) if indicated by your results.   Please start the pantoprazole daily for 2 weeks. Follow dietary recommendations below.  Keep up with your respiratory medications. I am getting you set up ASAP with Pulmonology for post-covid assessment and for further evaluation of potential sleep apnea.   Hang in there!   [email or web address]  DASH Eating Plan DASH stands for Dietary Approaches to Stop Hypertension. The DASH eating plan is a healthy eating plan that has been shown to:  Reduce high blood pressure (hypertension).  Reduce your risk for type 2 diabetes, heart disease, and stroke.  Help with weight loss. What are tips for following this plan? Reading food labels  Check food labels for the amount of salt (sodium) per serving. Choose foods with less than 5 percent of the Daily Value of sodium. Generally, foods with less than 300 milligrams (mg) of sodium per serving fit into this eating plan.  To find whole grains, look for the word "whole" as the first word in the ingredient list. Shopping  Buy products labeled as "low-sodium" or "no salt added."  Buy fresh foods. Avoid canned foods and pre-made or frozen meals. Cooking  Avoid adding salt when cooking. Use salt-free seasonings or herbs instead of table salt or sea salt. Check with your health care provider or pharmacist before using salt substitutes.  Do not fry foods. Cook foods using healthy methods such as baking, boiling, grilling, roasting, and broiling instead.  Cook with heart-healthy oils, such as olive, canola, avocado, soybean, or sunflower oil. Meal planning  Eat a balanced diet that includes: ? 4 or more servings of fruits and 4 or more servings of vegetables each day. Try to fill one-half of your plate with fruits and vegetables. ? 6-8 servings of whole  grains each day. ? Less than 6 oz (170 g) of lean meat, poultry, or fish each day. A 3-oz (85-g) serving of meat is about the same size as a deck of cards. One egg equals 1 oz (28 g). ? 2-3 servings of low-fat dairy each day. One serving is 1 cup (237 mL). ? 1 serving of nuts, seeds, or beans 5 times each week. ? 2-3 servings of heart-healthy fats. Healthy fats called omega-3 fatty acids are found in foods such as walnuts, flaxseeds, fortified milks, and eggs. These fats are also found in cold-water fish, such as sardines, salmon, and mackerel.  Limit how much you eat of: ? Canned or prepackaged foods. ? Food that is high in trans fat, such as some fried foods. ? Food that is high in saturated fat, such as fatty meat. ? Desserts and other sweets, sugary drinks, and other foods with added sugar. ? Full-fat dairy products.  Do not salt foods before eating.  Do not eat more than 4 egg yolks a week.  Try to eat at least 2 vegetarian meals a week.  Eat more home-cooked food and less restaurant, buffet, and fast food.   Lifestyle  When eating at a restaurant, ask that your food be prepared with less salt or no salt, if possible.  If you drink alcohol: ? Limit how much you use to:  0-1 drink a day for women who are not pregnant.  0-2 drinks a day for men. ? Be aware of how much alcohol is in your drink. In the [place] one drink equals one 12 oz bottle of  beer (355 mL), one 5 oz glass of wine (148 mL), or one 1 oz glass of hard liquor (44 mL). General information  Avoid eating more than 2,300 mg of salt a day. If you have hypertension, you may need to reduce your sodium intake to 1,500 mg a day.  Work with your health care provider to maintain a healthy body weight or to lose weight. Ask what an ideal weight is for you.  Get at least 30 minutes of exercise that causes your heart to beat faster (aerobic exercise) most days of the week. Activities may include walking, swimming, or  biking.  Work with your health care provider or dietitian to adjust your eating plan to your individual calorie needs. What foods should I eat? Fruits All fresh, dried, or frozen fruit. Canned fruit in natural juice (without added sugar). Vegetables Fresh or frozen vegetables (raw, steamed, roasted, or grilled). Low-sodium or reduced-sodium tomato and vegetable juice. Low-sodium or reduced-sodium tomato sauce and tomato paste. Low-sodium or reduced-sodium canned vegetables. Grains Whole-grain or whole-wheat bread. Whole-grain or whole-wheat pasta. Brown rice. [place]. Bulgur. Whole-grain and low-sodium cereals. Pita bread. Low-fat, low-sodium crackers. Whole-wheat flour tortillas. Meats and other proteins Skinless chicken or [place]. Ground chicken or [place]. Pork with fat trimmed off. Fish and seafood. Egg whites. Dried beans, peas, or lentils. Unsalted nuts, nut butters, and seeds. Unsalted canned beans. Lean cuts of beef with fat trimmed off. Low-sodium, lean precooked or cured meat, such as sausages or meat loaves. Dairy Low-fat (1%) or fat-free (skim) milk. Reduced-fat, low-fat, or fat-free cheeses. Nonfat, low-sodium ricotta or cottage cheese. Low-fat or nonfat yogurt. Low-fat, low-sodium cheese. Fats and oils Soft margarine without trans fats. Vegetable oil. Reduced-fat, low-fat, or light mayonnaise and salad dressings (reduced-sodium). Canola, safflower, olive, avocado, soybean, and sunflower oils. Avocado. Seasonings and condiments Herbs. Spices. Seasoning mixes without salt. Other foods Unsalted popcorn and pretzels. Fat-free sweets. The items listed above may not be a complete list of foods and beverages you can eat. Contact a dietitian for more information. What foods should I avoid? Fruits Canned fruit in a light or heavy syrup. Fried fruit. Fruit in cream or butter sauce. Vegetables Creamed or fried vegetables. Vegetables in a cheese sauce. Regular canned vegetables (not  low-sodium or reduced-sodium). Regular canned tomato sauce and paste (not low-sodium or reduced-sodium). Regular tomato and vegetable juice (not low-sodium or reduced-sodium). Angie Fava. Olives. Grains Baked goods made with fat, such as croissants, muffins, or some breads. Dry pasta or rice meal packs. Meats and other proteins Fatty cuts of meat. Ribs. Fried meat. Berniece Salines. Bologna, salami, and other precooked or cured meats, such as sausages or meat loaves. Fat from the back of a pig (fatback). Bratwurst. Salted nuts and seeds. Canned beans with added salt. Canned or smoked fish. Whole eggs or egg yolks. Chicken or [place] with skin. Dairy Whole or 2% milk, cream, and half-and-half. Whole or full-fat cream cheese. Whole-fat or sweetened yogurt. Full-fat cheese. Nondairy creamers. Whipped toppings. Processed cheese and cheese spreads. Fats and oils Butter. Stick margarine. Lard. Shortening. Ghee. Bacon fat. Tropical oils, such as coconut, palm kernel, or palm oil. Seasonings and condiments Onion salt, garlic salt, seasoned salt, table salt, and sea salt. Worcestershire sauce. Tartar sauce. Barbecue sauce. Teriyaki sauce. Soy sauce, including reduced-sodium. Steak sauce. Canned and packaged gravies. Fish sauce. Oyster sauce. Cocktail sauce. Store-bought horseradish. Ketchup. Mustard. Meat flavorings and tenderizers. Bouillon cubes. Hot sauces. Pre-made or packaged marinades. Pre-made or packaged taco seasonings. Relishes. Regular salad dressings.  Other foods Salted popcorn and pretzels. The items listed above may not be a complete list of foods and beverages you should avoid. Contact a dietitian for more information. Where to find more information  National Heart, Lung, and Blood Institute: PopSteam.is  American Heart Association: www.heart.org  Academy of Nutrition and Dietetics: www.eatright.org  National Kidney Foundation: www.kidney.org Summary  The DASH eating plan is a healthy eating  plan that has been shown to reduce high blood pressure (hypertension). It may also reduce your risk for type 2 diabetes, heart disease, and stroke.  When on the DASH eating plan, aim to eat more fresh fruits and vegetables, whole grains, lean proteins, low-fat dairy, and heart-healthy fats.  With the DASH eating plan, you should limit salt (sodium) intake to 2,300 mg a day. If you have hypertension, you may need to reduce your sodium intake to 1,500 mg a day.  Work with your health care provider or dietitian to adjust your eating plan to your individual calorie needs. This information is not intended to replace advice given to you by your health care provider. Make sure you discuss any questions you have with your health care provider. Document Revised: 09/23/2019 Document Reviewed: 09/23/2019 Elsevier Patient Education  2021 [employer].

## 2020-11-30 LAB — HEPATITIS C ANTIBODY
Hepatitis C Ab: NONREACTIVE
SIGNAL TO CUT-OFF: 0.04 (ref <1.00)

## 2020-12-02 ENCOUNTER — Other Ambulatory Visit: Payer: Self-pay | Admitting: Physician Assistant

## 2020-12-02 DIAGNOSIS — F32A Depression, unspecified: Secondary | ICD-10-CM

## 2020-12-02 DIAGNOSIS — F419 Anxiety disorder, unspecified: Secondary | ICD-10-CM

## 2020-12-03 MED ORDER — ALPRAZOLAM 0.25 MG PO TABS: 0.2500 mg | ORAL_TABLET | Freq: Two times a day (BID) | ORAL | 0 refills | Status: DC | PRN

## 2020-12-03 NOTE — Telephone Encounter (Signed)
Xanax last rx 10/19/20 #30 LOV: 11/29/20 Hospital follow up [place]: 05/25/18

## 2020-12-05 ENCOUNTER — Other Ambulatory Visit: Payer: Self-pay | Admitting: Emergency Medicine

## 2020-12-05 DIAGNOSIS — E559 Vitamin D deficiency, unspecified: Secondary | ICD-10-CM

## 2020-12-05 MED ORDER — VITAMIN D (ERGOCALCIFEROL) 1.25 MG (50000 UNIT) PO CAPS: 50000.0000 [IU] | ORAL_CAPSULE | ORAL | 3 refills | Status: AC

## 2020-12-07 ENCOUNTER — Telehealth: Payer: Self-pay

## 2020-12-07 NOTE — Progress Notes (Signed)
Chronic Care Management Pharmacy Assistant   Name: Daniel Durham  MRN: [id number] DOB: Mar 22, 1966  Reason for Encounter: Disease State  PCP : Brunetta Jeans, PA-C  Allergies:   Allergies  Allergen Reactions  . Aspirin Swelling  . Hydrocodone Itching    Medications: Outpatient Encounter Medications as of 12/07/2020  Medication Sig  . albuterol (VENTOLIN HFA) 108 (90 Base) MCG/ACT inhaler Inhale 1-2 puffs into the lungs every 6 (six) hours as needed for wheezing or shortness of breath.  . allopurinol (ZYLOPRIM) 100 MG tablet Take 1 tablet (100 mg total) by mouth daily.  Marland Kitchen ALPRAZolam (XANAX) 0.25 MG tablet Take 1 tablet (0.25 mg total) by mouth 2 (two) times daily as needed for anxiety.  Marland Kitchen atorvastatin (LIPITOR) 10 MG tablet TAKE 1 TABLET(10 MG) BY MOUTH DAILY  . cyclobenzaprine (FLEXERIL) 10 MG tablet Take 1 tablet (10 mg total) by mouth at bedtime. (Patient taking differently: Take 10 mg by mouth at bedtime as needed for muscle spasms. )  . gabapentin (NEURONTIN) 300 MG capsule Take 1 capsule each morning and midday along with your 600 mg dose to make a total of 900 mg (Patient taking differently: Take 300 mg by mouth daily. Taking daily at noon along with 600mg  = 900mg )  . gabapentin (NEURONTIN) 600 MG tablet TAKE 1 TABLET(600 MG) BY MOUTH THREE TIMES DAILY  . ipratropium-albuterol (DUONEB) 0.5-2.5 (3) MG/3ML SOLN Take 3 mLs by nebulization every 8 (eight) hours as needed (shortness of breath, wheezing).  Marland Kitchen lisinopril (ZESTRIL) 20 MG tablet Take 1 tablet (20 mg total) by mouth daily.  . montelukast (SINGULAIR) 10 MG tablet Take 1 tablet (10 mg total) by mouth daily.  . nortriptyline (PAMELOR) 10 MG capsule Take 2 capsules (20 mg total) by mouth at bedtime.  Marland Kitchen oxyCODONE-acetaminophen (PERCOCET) 10-325 MG tablet Take 1 tablet by mouth every 6 (six) hours as needed for pain.  . pantoprazole (PROTONIX) 40 MG tablet Take 1 tablet (40 mg total) by mouth daily.  . sertraline (ZOLOFT)  50 MG tablet Take 1 tablet (50 mg total) by mouth daily.  . Vitamin D, Ergocalciferol, (DRISDOL) 1.25 MG (50000 UNIT) CAPS capsule Take 1 capsule (50,000 Units total) by mouth every 7 (seven) days.   No facility-administered encounter medications on file as of 12/07/2020.    Current Diagnosis: Patient Active Problem List   Diagnosis Date Noted  . Pain of toe of left foot 04/16/2020  . Closed fracture of lateral malleolus 03/08/2020  . Closed left ankle fracture 02/16/2020  . Ankle fracture, bimalleolar, closed, left, initial encounter 02/15/2020  . Morbid obesity ([place]) 12/27/2019  . HTN (hypertension) 11/18/2019  . Colon cancer screening 05/25/2018  . Anxiety and depression 03/03/2017  . Cervical disc disorder with radiculopathy of cervical region 07/24/2015  . 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 Visit for preventive health examination 02/04/2015  . Chronic back pain greater than 3 months duration 01/23/2015  . 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 Spoke with patient regarding BP  Recent Office Vitals: BP Readings from Last 3 Encounters:  12/02/20 130/85  10/03/20 (!) 160/97  08/29/20 105/79   Pulse Readings from Last 3 Encounters:  10/03/20 74  08/29/20 80  08/15/20 88    Wt Readings from Last 3 Encounters:  10/03/20 270 lb (122.5 kg)  08/29/20 267  lb 12.8 oz (121.5 kg)  08/15/20 270 lb (122.5 kg)     Kidney Function Lab Results  Component Value Date/Time   CREATININE 0.92 11/29/2020 11:32 AM   CREATININE 1.00 08/15/2020 02:59 PM   GFR 94.87 11/29/2020 11:32 AM   GFRNONAA >60 08/15/2020 12:26 PM   GFRAA >60 02/28/2020 04:04 AM    BMP Latest Ref Rng & Units 11/29/2020 08/15/2020 08/15/2020  Glucose 70 - 99 mg/dL 133(H) 96 -  BUN 6 - 23 mg/dL 12 8 -   Creatinine 0.40 - 1.50 mg/dL 0.92 1.00 1.10  Sodium 135 - 145 mEq/L 138 138 -  Potassium 3.5 - 5.1 mEq/L 4.0 4.0 -  Chloride 96 - 112 mEq/L 106 104 -  CO2 19 - 32 mEq/L 25 - -  Calcium 8.4 - 10.5 mg/dL 8.6 - -    . Current antihypertensive regimen:                     -lisinopril (ZESTRIL) 20 MG tablet                    -allopurinol (ZYLOPRIM) 100 MG tablet . How often are you checking your Blood Pressure? infrequently . Current home BP readings: Patient stated it has been ranging 115/83. Patient states this is the lowest it has been . Patient denied any dizziness or lethargic symptoms.  . What recent interventions/DTPs have been made by any provider to improve Blood Pressure control since last CPP Visit: None Noted . Any recent hospitalizations or ED visits since last visit with CPP? Yes                 11/09/20 Patient was seen in ED for shortness of breathing. Patient was positive for COVID OC , patient has laryngitis with vocal cord swelling resulting in air trapping in the lungs. Patient was stable for discharge and to follow up with PCP in 1 week.  . What diet changes have been made to improve Blood Pressure Control o Patient stated theres has been no changes in diet, states he eats whatever he likes. . What exercise is being done to improve your Blood Pressure Control?  o Patient stated he is still recovering so has not been exercising.   Patient stated he was in the hospital for two weeks due to testing positive for COVID.Patient stated he has really bad respiratory problems this time around. Patient stated he is getting better however he still has a cough. Patient stated he saw his PCP Elyn Aquas last week and was put on some iron pills due to his iron being low in the hospital. Patient also stated he transferred his care to [facility] due to Dr Hassell Done leaving the practice. Informed patient that [facility] offers ccm program as well. Patient stated he goes for his physical  next week and will mention to provider about continuing  ccm .   Adherence Review: Is the patient currently on ACE/ARB medication? Yes Does the patient have >5 day gap between last estimated fill dates? CPP to review   [place] ,[place] Pharmacist Assistant [phone]  Follow-Up:  Pharmacist Review

## 2020-12-10 ENCOUNTER — Other Ambulatory Visit: Payer: Self-pay

## 2020-12-11 ENCOUNTER — Ambulatory Visit (INDEPENDENT_AMBULATORY_CARE_PROVIDER_SITE_OTHER): Payer: Medicare HMO | Admitting: Family Medicine

## 2020-12-11 VITALS — BP 110/72 | HR 104 | Temp 98.0°F | Ht 67.0 in | Wt 266.2 lb

## 2020-12-11 DIAGNOSIS — R739 Hyperglycemia, unspecified: Secondary | ICD-10-CM | POA: Diagnosis not present

## 2020-12-11 DIAGNOSIS — F32A Depression, unspecified: Secondary | ICD-10-CM

## 2020-12-11 DIAGNOSIS — M549 Dorsalgia, unspecified: Secondary | ICD-10-CM | POA: Diagnosis not present

## 2020-12-11 DIAGNOSIS — F419 Anxiety disorder, unspecified: Secondary | ICD-10-CM | POA: Diagnosis not present

## 2020-12-11 DIAGNOSIS — E782 Mixed hyperlipidemia: Secondary | ICD-10-CM | POA: Diagnosis not present

## 2020-12-11 DIAGNOSIS — R21 Rash and other nonspecific skin eruption: Secondary | ICD-10-CM

## 2020-12-11 DIAGNOSIS — J4520 Mild intermittent asthma, uncomplicated: Secondary | ICD-10-CM | POA: Diagnosis not present

## 2020-12-11 DIAGNOSIS — E785 Hyperlipidemia, unspecified: Secondary | ICD-10-CM

## 2020-12-11 DIAGNOSIS — E119 Type 2 diabetes mellitus without complications: Secondary | ICD-10-CM

## 2020-12-11 DIAGNOSIS — I10 Essential (primary) hypertension: Secondary | ICD-10-CM

## 2020-12-11 DIAGNOSIS — G8929 Other chronic pain: Secondary | ICD-10-CM

## 2020-12-11 LAB — LIPID PANEL
Cholesterol: 253 mg/dL — ABNORMAL HIGH (ref 0–200)
HDL: 38.5 mg/dL — ABNORMAL LOW (ref >39.00)
NonHDL: 214.34
Total CHOL/HDL Ratio: 7
Triglycerides: 218 mg/dL — ABNORMAL HIGH (ref 0.0–149.0)
VLDL: 43.6 mg/dL — ABNORMAL HIGH (ref 0.0–40.0)

## 2020-12-11 LAB — LDL CHOLESTEROL, DIRECT: Direct LDL: 175 mg/dL

## 2020-12-11 LAB — HEMOGLOBIN A1C: Hgb A1c MFr Bld: 7 % — ABNORMAL HIGH (ref 4.6–6.5)

## 2020-12-11 IMAGING — DX DG CHEST 1V PORT
1 series · 1 of 1 positions shown · non-contrast
Comparison: 02/06/2020 chest radiograph.

CLINICAL DATA: Dyspnea

EXAM:
PORTABLE CHEST 1 VIEW

[chest]
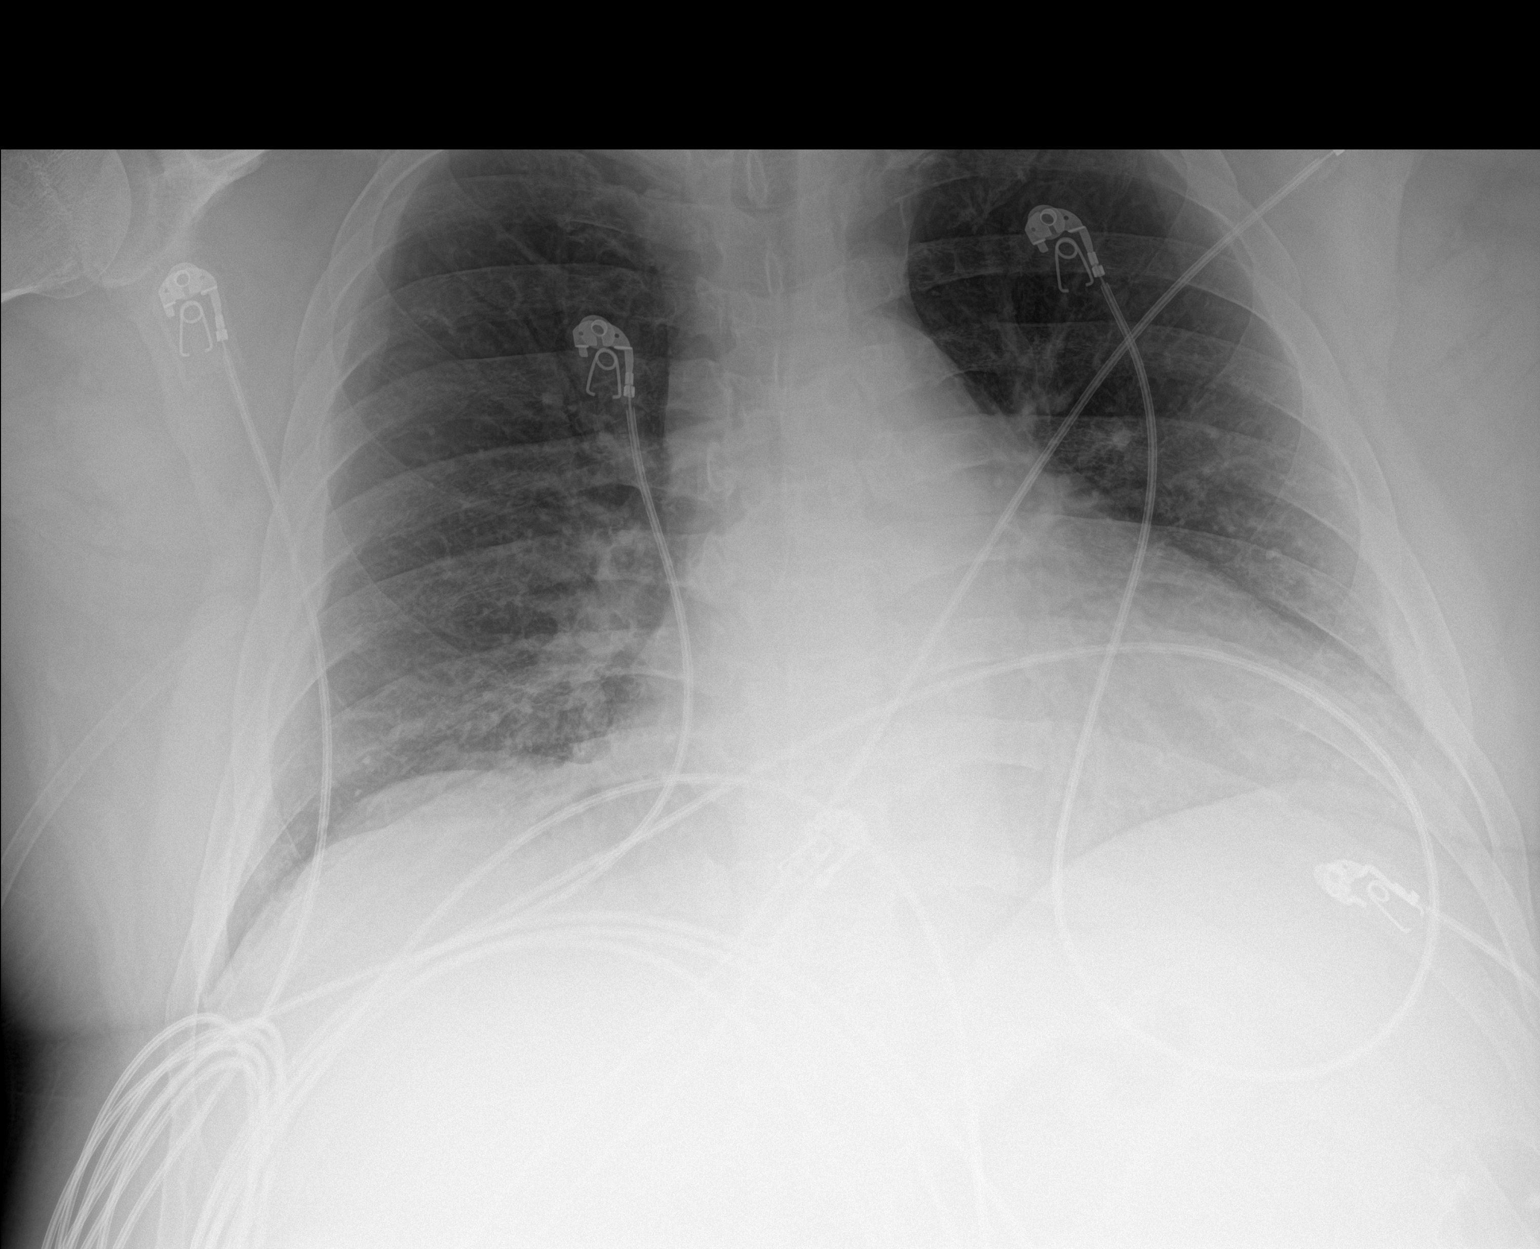

[1 of 1 positions shown; findings below may reference images not displayed]

FINDINGS: Stable cardiomediastinal silhouette with mild cardiomegaly. No
pneumothorax. No pleural effusion. Lungs appear clear, with no acute
consolidative airspace disease and no pulmonary edema.
IMPRESSION: Stable mild cardiomegaly without pulmonary edema. No active
pulmonary disease.

## 2020-12-11 MED ORDER — SERTRALINE HCL 50 MG PO TABS: 100.0000 mg | ORAL_TABLET | Freq: Every day | ORAL | 0 refills | Status: DC

## 2020-12-11 NOTE — Progress Notes (Signed)
[name] [address] [place] [address] Dept: [phone] Dept Fax: [phone]  Transfer of Care Office Visit  Subjective:    Patient ID: Daniel Durham, male    DOB: Mar 10, 1966, 55 y.o..   MRN: [id number]  Chief Complaint  Patient presents with  . Transitions Of Care    TOC,     History of Present Illness:  Patient is in today to establish care. Daniel Durham had been being seen at another [facility] clinic near [place], but his PCP there is taking on a new role.   Daniel Durham has multiple health issues for which he is currently treated. He notes the past year has been a bit difficult for him due to a series of illness. He developed COVID in Dec. 2020 and was hospitalized for several weeks. During the time he was recovering at home, he suffered a left ankle fracture requiring extensive surgery (Feb. 2021). He notes he continues to have some degree of pain in his ankle and uses a cane to assist ambulation. Last month, he contracted COVID for the 2nd time and was once again hospitalized. He continues to have episodes of dyspnea that he finds bothersome.  Daniel Durham has a history of hypertension. He notes that since his hospitalization for COVID, his blood pressures have been much improved.  Daniel Durham has a history of chronic low back pain. He is followed by Leeroy Cha (Physical Medicine and Rehabilitation) related to this. He notes he has received spinal steroid injections in the past. Dr. Ranell Patrick is managing him on chronic opioids.  Daniel Durham has a history of anxiety and depression. He has been managed by his PCP on sertraline and Xanax. He notes that he typically only takes the Xanax once a day. He feels his anxiety has been worse recently and it not sure the Xanax dose is adequate. He has been on Zoloft 50 mg for quite some time.  Daniel Durham has a chronic rash on the lower legs, worse on the right. He  notes that this fluctuates in severity. It is not clear if this is pruritic or not. He has not had this evaluated or treated with any particular medication. He does note thickened and dystrophic nails that he has some difficulty managing. He has seen a podiatrist int he past, but felt the nail care provided was too painful.  In reviewing his medication list, Daniel Durham also notes he has gout for which he takes allopurinol. His first attach occurred int he left 1st MTP at some point during the treatment of his ankle fracture. As well, he has mild intermittent asthma that appears to be triggered by seasonal allergies. He is also being managed for hyperlipidemia, bu is unsure of his levels.  Past Medical History: Patient Active Problem List   Diagnosis Date Noted  . Pain of toe of left foot 04/16/2020  . Closed fracture of lateral malleolus 03/08/2020  . Closed left ankle fracture 02/16/2020  . Ankle fracture, bimalleolar, closed, left, initial encounter 02/15/2020  . Morbid obesity ([place]) 12/27/2019  . HTN (hypertension) 11/18/2019  . Colon cancer screening 05/25/2018  . Anxiety and depression 03/03/2017  . Cervical disc disorder with radiculopathy of cervical region 07/24/2015  . 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 Visit for preventive health examination 02/04/2015  . Chronic back pain greater than 3 months duration 01/23/2015  . 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 Amputation stump complication ([place]) 42/68/3419   Past Surgical History:  Procedure Laterality Date  . BACK SURGERY    . CARDIAC CATHETERIZATION     11/05/10 [facility], [place]): Briding of mLAD, no sign disease. EF 45% in RAO, 60% LAO (51% stress, 54% rest by NM stress; 57-59% echo 10/2010)  . CARPAL TUNNEL RELEASE     Bilateral  . CERVICAL DISCECTOMY  12/17/2017   titanium plates 12-17-17  .  COLONOSCOPY    . HAND AMPUTATION  2007  . ORIF ANKLE FRACTURE Left 02/23/2020   Procedure: OPEN REDUCTION INTERNAL FIXATION (ORIF) ANKLE FRACTURE;  Surgeon: Nicholes Stairs, MD;  Location: [place];  Service: Orthopedics;  Laterality: Left;  . POLYPECTOMY     pt states had colon in [place] and he had polyps- he has no idea what type   . ROTATOR CUFF REPAIR     Left  . SPINAL CORD STIMULATOR IMPLANT    . SPINAL CORD STIMULATOR REMOVAL N/A 10/26/2015   Procedure: LUMBAR SPINAL CORD STIMULATOR REMOVAL;  Surgeon: Clydell Hakim, MD;  Location: [place] [facility];  Service: Neurosurgery;  Laterality: N/A;   Family History  Problem Relation Age of Onset  . Diabetes Mother        Living  . Sleep apnea Mother   . Diabetes Father 73       Deceased  . Hypertension Father   . Jaundice Father   . Hemophilia Father   . Alcoholism Father   . Cancer Maternal Grandfather        Throat  . Esophageal cancer Maternal Grandfather   . Cancer Paternal Uncle        Throat  . Esophageal cancer Paternal Uncle   . Asthma Sister   . Diabetes Sister   . Healthy Son        X1  . Healthy Daughter        X1  . Colon polyps Neg Hx   . Colon cancer Neg Hx   . Rectal cancer Neg Hx   . Stomach cancer Neg Hx    Outpatient Medications Prior to Visit  Medication Sig Dispense Refill  . albuterol (VENTOLIN HFA) 108 (90 Base) MCG/ACT inhaler Inhale 1-2 puffs into the lungs every 6 (six) hours as needed for wheezing or shortness of breath. 18 g 2  . ALPRAZolam (XANAX) 0.25 MG tablet Take 1 tablet (0.25 mg total) by mouth 2 (two) times daily as needed for anxiety. 30 tablet 0  . cyclobenzaprine (FLEXERIL) 10 MG tablet Take 1 tablet (10 mg total) by mouth at bedtime. (Patient taking differently: Take 10 mg by mouth at bedtime as needed for muscle spasms.) 30 tablet 0  . gabapentin (NEURONTIN) 300 MG capsule Take 1 capsule each morning and midday along with your 600 mg dose to make a total of 900 mg (Patient taking  differently: Take 300 mg by mouth daily. Taking daily at noon along with 600mg  = 900mg ) 60 capsule 3  . gabapentin (NEURONTIN) 600 MG tablet TAKE 1 TABLET(600 MG) BY MOUTH THREE TIMES DAILY 270 tablet 0  . ipratropium-albuterol (DUONEB) 0.5-2.5 (3) MG/3ML SOLN Take 3 mLs by nebulization every 8 (eight) hours as needed (shortness of breath, wheezing). 3 mL 11  . lisinopril (ZESTRIL) 20 MG tablet Take 1 tablet (20 mg total) by mouth daily. 90 tablet 1  . montelukast (SINGULAIR) 10 MG tablet Take 1 tablet (10 mg total) by mouth daily. 90 tablet 1  .  nortriptyline (PAMELOR) 10 MG capsule Take 2 capsules (20 mg total) by mouth at bedtime. 60 capsule 1  . oxyCODONE-acetaminophen (PERCOCET) 10-325 MG tablet Take 1 tablet by mouth every 6 (six) hours as needed for pain. 120 tablet 0  . Vitamin D, Ergocalciferol, (DRISDOL) 1.25 MG (50000 UNIT) CAPS capsule Take 1 capsule (50,000 Units total) by mouth every 7 (seven) days. 5 capsule 3  . pantoprazole (PROTONIX) 40 MG tablet Take 1 tablet (40 mg total) by mouth daily. 30 tablet 3  . sertraline (ZOLOFT) 50 MG tablet Take 1 tablet (50 mg total) by mouth daily. 90 tablet 1  . allopurinol (ZYLOPRIM) 100 MG tablet Take 1 tablet (100 mg total) by mouth daily. (Patient not taking: Reported on 12/11/2020) 90 tablet 1  . atorvastatin (LIPITOR) 10 MG tablet TAKE 1 TABLET(10 MG) BY MOUTH DAILY (Patient not taking: Reported on 12/11/2020) 90 tablet 1   No facility-administered medications prior to visit.   Allergies  Allergen Reactions  . Aspirin Swelling  . Hydrocodone Itching     Objective:   Today's Vitals   12/11/20 1012  BP: 110/72  Pulse: (!) 104  Temp: 98 F (36.7 C)  TempSrc: Temporal  SpO2: 94%  Weight: 266 lb 3.2 oz (120.7 kg)  Height: 5\' 7"  (1.702 m)   Body mass index is 41.69 kg/m.   General: Well developed, well nourished. Obese. No acute distress. Lungs: Clear to auscultation bilaterally. CV: RRR without murmurs or rubs. Pulses 2+  bilaterally. Extremities: Left forearm stump is well healed. There is some atrophy of the upper arm muscles. Skin: Warm and dry. There is +/- some mild edema to the lower legs. The skin has a reddish-purple hue. There are   scattered 1 cm macules with a light scale around the border. There are more of thes eon the right, but still a few on   the left. Psych: Alert and oriented. Normal mood and affect.  Health Maintenance Due  Topic Date Due  . COVID-19 Vaccine (1) Never done  . COLONOSCOPY (Pts 45-40yrs Insurance coverage will need to be confirmed)  07/29/2019   Lab Results Lab Results  Component Value Date   CHOL 253 (H) 12/11/2020   HDL 38.50 (L) 12/11/2020   LDLCALC 148 (H) 09/21/2019   LDLDIRECT 175.0 12/11/2020   TRIG 218.0 (H) 12/11/2020   CHOLHDL 7 12/11/2020   Lab Results  Component Value Date   HGBA1C 7.0 (H) 12/11/2020     Assessment & Plan:   1. Hypertension, unspecified type Pressure is at goal. I recommend we continue his lisinopril.  2. Hyperlipidemia, unspecified hyperlipidemia type We checked a lipid panel today. This returned after the patient had left for the day. His total cholesterol and LDL are still elevated. We had discussed that he is on a low dose of statin at this point. I recommend we increase his statin to 20 mg daily (ultimately, he likely needs 40 mg, but he is hesitant about his meds). I will communicate this change to him through My Chart.  3. Hyperglycemia Mr. Schwabe had an elevated blood sugar (133 mg/dL on 11/29/20) on a recent blood draw. I checked a HgbA1c to assess. The result returned after his visit. His HbA1c of 7.0 is consistent with Type 2 DM. I will reach out to him and ask him to follow-up in the next 2 weeks to discuss the diagnosis and treatment recommendations.  4. Rash and nonspecific skin eruption The etiology of the rash is unclear. I will  refer him to dermatology for assessment.  - Ambulatory referral to Dermatology  5.  Anxiety and depression Mr. Kijowski subjectively feels his anxiety is not in good control. Rather than increasing his Xanax, I recommend we increase his Zoloft dose to 100 mg daily. We will reassess this in 6 weeks.  - sertraline (ZOLOFT) 50 MG tablet; Take 2 tablets (100 mg total) by mouth daily.  Dispense: 180 tablet; Refill: 0  6. Chronic back pain greater than 3 months duration Mr. Fairhurst will continue to follow with Dr. Ranell Patrick.  Mr. Trotta was previously referred for chronic care management. I will reinstitute that through our practice.   Haydee Salter, MD

## 2020-12-12 ENCOUNTER — Encounter: Payer: Self-pay | Admitting: Family Medicine

## 2020-12-12 DIAGNOSIS — E785 Hyperlipidemia, unspecified: Secondary | ICD-10-CM | POA: Insufficient documentation

## 2020-12-12 DIAGNOSIS — J45909 Unspecified asthma, uncomplicated: Secondary | ICD-10-CM | POA: Insufficient documentation

## 2020-12-12 DIAGNOSIS — E119 Type 2 diabetes mellitus without complications: Secondary | ICD-10-CM | POA: Insufficient documentation

## 2020-12-18 ENCOUNTER — Other Ambulatory Visit: Payer: Self-pay | Admitting: Physician Assistant

## 2020-12-18 DIAGNOSIS — M5010 Cervical disc disorder with radiculopathy, unspecified cervical region: Secondary | ICD-10-CM

## 2020-12-19 ENCOUNTER — Encounter: Payer: Medicare HMO | Attending: Physical Medicine and Rehabilitation | Admitting: Physical Medicine and Rehabilitation

## 2020-12-19 ENCOUNTER — Encounter: Payer: Self-pay | Admitting: Physical Medicine and Rehabilitation

## 2020-12-19 ENCOUNTER — Other Ambulatory Visit: Payer: Self-pay

## 2020-12-19 VITALS — BP 144/92 | HR 81 | Temp 97.8°F | Ht 67.0 in | Wt 267.0 lb

## 2020-12-19 DIAGNOSIS — M4807 Spinal stenosis, lumbosacral region: Secondary | ICD-10-CM | POA: Insufficient documentation

## 2020-12-19 DIAGNOSIS — F4310 Post-traumatic stress disorder, unspecified: Secondary | ICD-10-CM | POA: Diagnosis not present

## 2020-12-19 DIAGNOSIS — U099 Post covid-19 condition, unspecified: Secondary | ICD-10-CM | POA: Diagnosis not present

## 2020-12-19 DIAGNOSIS — F419 Anxiety disorder, unspecified: Secondary | ICD-10-CM | POA: Diagnosis not present

## 2020-12-19 DIAGNOSIS — Z79891 Long term (current) use of opiate analgesic: Secondary | ICD-10-CM | POA: Insufficient documentation

## 2020-12-19 DIAGNOSIS — F32A Depression, unspecified: Secondary | ICD-10-CM | POA: Diagnosis not present

## 2020-12-19 DIAGNOSIS — E119 Type 2 diabetes mellitus without complications: Secondary | ICD-10-CM | POA: Diagnosis not present

## 2020-12-19 DIAGNOSIS — G894 Chronic pain syndrome: Secondary | ICD-10-CM | POA: Diagnosis not present

## 2020-12-19 DIAGNOSIS — Z5181 Encounter for therapeutic drug level monitoring: Secondary | ICD-10-CM

## 2020-12-19 MED ORDER — NORTRIPTYLINE HCL 25 MG PO CAPS: 25.0000 mg | ORAL_CAPSULE | Freq: Every day | ORAL | 1 refills | Status: DC

## 2020-12-19 MED ORDER — OXYCODONE HCL 10 MG PO TABS: 10.0000 mg | ORAL_TABLET | Freq: Four times a day (QID) | ORAL | 0 refills | Status: DC | PRN

## 2020-12-19 NOTE — Progress Notes (Deleted)
   Subjective:    Patient ID: Daniel Durham, male    DOB: 06/15/1966, 55 y.o.   MRN: [id number]  HPI    Review of Systems     Objective:   Physical Exam        Assessment & Plan:

## 2020-12-19 NOTE — Progress Notes (Signed)
Subjective:    Patient ID: Daniel Durham, male    DOB: [birth year], 55 y.o.   MRN: [id number]  HPI  [name] is a 55 year old pleasant man who presents for follow-up of lumbar spinal stenosis with neurogenic claudication.  1) Lumbar spinal stenosis with neurogenic claudication:  -His back pain, leg pain, and hip pain have worsened since last visit.   -The pain has been radiating up his back to the left side of his neck, as well as into both legs  -Present bilaterally  -He feels that pain has been worse since COVID-19 diagnosis  His right leg feels numb and weak.    Reviewed MRI lumbar spine from November with him: IMPRESSION: 1. Progressive canal stenosis at L2-L3 (now mild to moderate) and L3-L4 (now moderate), which appears to be largely due to increasing dorsal epidural lipomatosis with superimposed small disc bulge at L3-L4. 2. No substantial change in postoperative changes at L5-S1 with suspected granulation tissue in the left lateral recess. No convincing stenosis at this level.  Next Thursday he has an epidural steroid injection scheduled with Dr. Letta Pate.   His wife will bring him to his appointment.   2) Long COVID syndrome: Symptoms have worsened since COVID diagnosis. -He had to reschedule injection with Dr. Letta Pate -He feels shortness of breath  -He has been much more limited in his mobility.   3) Depression/Anxiety: -He has felt worsening of his symptoms and his Zoloft was recently increased.   4)Obesity: -He has lost three lbs since he was last here in December   Prior history:  He has been spending time outside. He can go outside in shorts and T shirt as he is from [place]. He tries to spend as much time outdoors as possible and felt he has lost weight recently.  He got a lumbar spine MRI that shows the following: 1. Progressive canal stenosis at L2-L3 (now mild to moderate) and L3-L4 (now moderate), which appears to be largely due to  increasing dorsal epidural lipomatosis with superimposed small disc bulge at L3-L4. 2. No substantial change in postoperative changes at L5-S1 with suspected granulation tissue in the left lateral recess. No convincing stenosis at this level.  In the last couple of weeks he has had a lot of nightmares. His wife notices that he is crying. This is not new for him, but it is happening more regularly. These have been preventing him from going back to sleep. He already does not sleep well.   He feels that the pain is inhibiting his ability to do his functional tasks.   Average pain is 7/10.    Pain Inventory Average Pain 6 Pain Right Now 8 My pain is sharp, stabbing, tingling and aching  In the last 24 hours, has pain interfered with the following? General activity 3 Relation with others 8 Enjoyment of life 6 What TIME of day is your pain at its worst? daytime and evening Sleep (in general) Poor  Pain is worse with: walking, bending, sitting, standing and some activites Pain improves with: rest and medication Relief from Meds: 6  Family History  Problem Relation Age of Onset  . Diabetes Mother        Living  . Sleep apnea Mother   . Diabetes Father 65       Deceased  . Hypertension Father   . Jaundice Father   . Hemophilia Father   . Alcoholism Father   . Cancer Maternal Grandfather  Throat  . Esophageal cancer Maternal Grandfather   . Cancer Paternal Uncle        Throat  . Esophageal cancer Paternal Uncle   . Asthma Sister   . Diabetes Sister   . Healthy Son        X1  . Healthy Daughter        X1  . Colon polyps Neg Hx   . Colon cancer Neg Hx   . Rectal cancer Neg Hx   . Stomach cancer Neg Hx    Social History   Socioeconomic History  . Marital status: Divorced    Spouse name: Not on file  . Number of children: Not on file  . Years of education: Not on file  . Highest education level: Not on file  Occupational History  . Not on file  Tobacco Use   . Smoking status: Never Smoker  . Smokeless tobacco: Never Used  Vaping Use  . Vaping Use: Never used  Substance and Sexual Activity  . Alcohol use: Yes    Alcohol/week: 0.0 standard drinks    Comment: occ  . Drug use: No  . Sexual activity: Yes  Other Topics Concern  . Not on file  Social History Narrative  . Not on file   Social Determinants of Health   Financial Resource Strain: Not on file  Food Insecurity: No Food Insecurity  . Worried About Charity fundraiser in the Last Year: Never true  . Ran Out of Food in the Last Year: Never true  Transportation Needs: No Transportation Needs  . Lack of Transportation (Medical): No  . Lack of Transportation (Non-Medical): No  Physical Activity: Not on file  Stress: Not on file  Social Connections: Not on file   Past Surgical History:  Procedure Laterality Date  . BACK SURGERY    . CARDIAC CATHETERIZATION     11/05/10 [facility], [place]): Briding of mLAD, no sign disease. EF 45% in RAO, 60% LAO (51% stress, 54% rest by NM stress; 57-59% echo 10/2010)  . CARPAL TUNNEL RELEASE     Bilateral  . CERVICAL DISCECTOMY  12/17/2017   titanium plates 12-17-17  . COLONOSCOPY    . HAND AMPUTATION  2007  . ORIF ANKLE FRACTURE Left 02/23/2020   Procedure: OPEN REDUCTION INTERNAL FIXATION (ORIF) ANKLE FRACTURE;  Surgeon: Nicholes Stairs, MD;  Location: [place];  Service: Orthopedics;  Laterality: Left;  . POLYPECTOMY     pt states had colon in [place] and he had polyps- he has no idea what type   . ROTATOR CUFF REPAIR     Left  . SPINAL CORD STIMULATOR IMPLANT    . SPINAL CORD STIMULATOR REMOVAL N/A 10/26/2015   Procedure: LUMBAR SPINAL CORD STIMULATOR REMOVAL;  Surgeon: Clydell Hakim, MD;  Location: [place] [facility];  Service: Neurosurgery;  Laterality: N/A;   Past Surgical History:  Procedure Laterality Date  . BACK SURGERY    . CARDIAC CATHETERIZATION     11/05/10 [facility], [place]): Briding of mLAD, no sign disease. EF  45% in RAO, 60% LAO (51% stress, 54% rest by NM stress; 57-59% echo 10/2010)  . CARPAL TUNNEL RELEASE     Bilateral  . CERVICAL DISCECTOMY  12/17/2017   titanium plates 12-17-17  . COLONOSCOPY    . HAND AMPUTATION  2007  . ORIF ANKLE FRACTURE Left 02/23/2020   Procedure: OPEN REDUCTION INTERNAL FIXATION (ORIF) ANKLE FRACTURE;  Surgeon: Nicholes Stairs, MD;  Location: [place];  Service: Orthopedics;  Laterality: Left;  . POLYPECTOMY     pt states had colon in [place] and he had polyps- he has no idea what type   . ROTATOR CUFF REPAIR     Left  . SPINAL CORD STIMULATOR IMPLANT    . SPINAL CORD STIMULATOR REMOVAL N/A 10/26/2015   Procedure: LUMBAR SPINAL CORD STIMULATOR REMOVAL;  Surgeon: Clydell Hakim, MD;  Location: [place] [facility];  Service: Neurosurgery;  Laterality: N/A;   Past Medical History:  Diagnosis Date  . Allergy   . Anxiety   . Asthma    SEASONAL   . Blood transfusion without reported diagnosis   . Chronic back pain    Neurostimulator  . Depression   . Environmental allergies   . Hypertension   . Neuromuscular disorder ([place])   . Neuropathy   . Seasonal allergic conjunctivitis    BP (!) 144/92   Pulse 81   Temp 97.8 F (36.6 C)   Ht 5\' 7"  (1.702 m)   Wt 267 lb (121.1 kg)   SpO2 95%   BMI 41.82 kg/m   Opioid Risk Score:   Fall Risk Score:  `1  Depression screen PHQ 2/9  Depression screen [facility] 2/9 12/11/2020 07/18/2020 02/06/2020 09/21/2019 06/08/2019 03/16/2019 02/15/2019  Decreased Interest 0 0 1 0 0 0 0  Down, Depressed, Hopeless 1 0 1 0 0 0 0  PHQ - 2 Score 1 0 2 0 0 0 0  Altered sleeping - 2 1 0 0 - 0  Tired, decreased energy - 0 1 0 0 - 0  Change in appetite - 0 0 0 0 - 0  Feeling bad or failure about yourself  - 0 0 0 0 - 0  Trouble concentrating - 1 0 0 0 - 0  Moving slowly or fidgety/restless - 0 0 0 0 - 0  Suicidal thoughts - 0 0 0 0 - 0  PHQ-9 Score - 3 4 0 0 - 0  Difficult doing work/chores - Not difficult at all Somewhat difficult Not difficult  at all Not difficult at all - Not difficult at all  Some recent data might be hidden    Review of Systems  Constitutional: Negative.   HENT: Negative.   Eyes: Negative.   Respiratory: Negative.   Cardiovascular: Negative.   Gastrointestinal: Negative.   Endocrine: Negative.   Genitourinary: Negative.   Musculoskeletal: Positive for back pain and neck pain.       Shoulder pain Wrist pain   Skin: Negative.   Allergic/Immunologic: Negative.   Neurological: Negative.   Hematological: Negative.   Psychiatric/Behavioral: Negative.   All other systems reviewed and are negative.      Objective:   Physical Exam Gen: no distress, normal appearing HEENT: oral mucosa pink and moist, NCAT Cardio: Reg rate Chest: normal effort, normal rate of breathing Abd: soft, non-distended Ext: no edema Psych: pleasant, normal affect, stressed about elevated HgbA1C Skin: intact Neuro: Alert and oriented x3.  Musculoskeletal: Left arm transradial amputation, ambulating with cane- antalgic gait, tender to palpation in the right sacrum.      Assessment & Plan:  Mrs. Drinkard is a 43 yar old man who presents for follow-up of the following issues:  1) Chronic Pain Syndrome secondary lumbar spinal stenosis with neurogenic claudication, currently bilateral -Discussed current symptoms of pain and history of pain.  -Discussed benefits of exercise in reducing pain. -Discussed following foods that may reduce pain: 1) Ginger 2) Blueberries 3) Salmon 4) Pumpkin seeds  5) dark chocolate 6) turmeric 7) tart cherries 8) virgin olive oil 9) chilli peppers 10) mint 11) red wine -Reviewed results from lumbar spine MRI:  IMPRESSION: 1. Progressive canal stenosis at L2-L3 (now mild to moderate) and L3-L4 (now moderate), which appears to be largely due to increasing dorsal epidural lipomatosis with superimposed small disc bulge at L3-L4. 2. No substantial change in postoperative changes at L5-S1  with suspected granulation tissue in the left lateral recess. No convincing stenosis at this level.  -The patient has low back pain; lower extremity pain; paresthesias; increased pain with movement changes; lower extremity weakness; and gait disturbance. The patient has no bowel or bladder dysfunction or saddle anesthesia. The patient has failed months of conservative care with physical therapy and HEP, medications, activity and lifestyle modification. MRI was performed and discussed above. The patient has had multiple ESIs before with last 2 years ago lasting 2 months.   -He is interested in epidural steroid injections. Scheduled with Dr. Letta Pate for bilateral L2-L3 and L3-L4 ESI. Advised that he will need a driver. ADDENDUM: Discussed with Dr. Letta Pate- given current symptoms worst on right side, please change procedure to right side L2-L3 and L3-L4 ESI.  -See #2  2) Transaminitis -Liver enzymes reviewed and ALT was elevated: discussed possible etiologies with patient Changed Percocet to Oxycodone HCL 10mg  up to 4 times per day (60MME) with release date one month from last. He has been using medication responsibly and it is providing relief. Given that pain is worse today, did not discuss lowering dose but hopefully we can consider after ESI.    2) Nightmares secondary to PTSD: -Increase Nortriptyline to 25mg  HS -Has helped with the nightmares, and hopefully will help with the sleep as well.  -Advised regarding risk of serotonin syndrome with sertraline- he will take latter in the morning.  -Ask wife to monitor for nightmares   3) Cough: -Recommended hot water, ginger, and honey. Advised that the ginger and turmeric also have anti-inflammatory properties that can help with pain.   -Has no fever -OTC medications are not helping.  4) Sinus congestion: -gets some relief from medication -hot shower.  -advised regarding steam inhalation.   5) Diabetes:  -He drinks gatorade and sprite,  not much -He notices sometimes uses foods as a comfort foods -Reviewed HgbA1c of 7- discussed what this test measures, what diabetes is, how sugars have likely been elevated from steroids and should hopefully improve as he is now off of steroids, made goal to stop all drinks with sugar

## 2020-12-23 DIAGNOSIS — R9431 Abnormal electrocardiogram [ECG] [EKG]: Secondary | ICD-10-CM | POA: Diagnosis not present

## 2020-12-23 DIAGNOSIS — I517 Cardiomegaly: Secondary | ICD-10-CM | POA: Diagnosis not present

## 2020-12-23 DIAGNOSIS — I5023 Acute on chronic systolic (congestive) heart failure: Secondary | ICD-10-CM | POA: Diagnosis not present

## 2020-12-23 DIAGNOSIS — R21 Rash and other nonspecific skin eruption: Secondary | ICD-10-CM | POA: Diagnosis not present

## 2020-12-23 DIAGNOSIS — F10229 Alcohol dependence with intoxication, unspecified: Secondary | ICD-10-CM | POA: Diagnosis not present

## 2020-12-23 DIAGNOSIS — R410 Disorientation, unspecified: Secondary | ICD-10-CM | POA: Diagnosis not present

## 2020-12-23 DIAGNOSIS — I509 Heart failure, unspecified: Secondary | ICD-10-CM | POA: Diagnosis not present

## 2020-12-23 DIAGNOSIS — W19XXXA Unspecified fall, initial encounter: Secondary | ICD-10-CM | POA: Diagnosis not present

## 2020-12-23 DIAGNOSIS — R059 Cough, unspecified: Secondary | ICD-10-CM | POA: Diagnosis not present

## 2020-12-23 DIAGNOSIS — R1111 Vomiting without nausea: Secondary | ICD-10-CM | POA: Diagnosis not present

## 2020-12-23 DIAGNOSIS — J90 Pleural effusion, not elsewhere classified: Secondary | ICD-10-CM | POA: Diagnosis not present

## 2020-12-23 DIAGNOSIS — J3489 Other specified disorders of nose and nasal sinuses: Secondary | ICD-10-CM | POA: Diagnosis not present

## 2020-12-23 DIAGNOSIS — S0990XA Unspecified injury of head, initial encounter: Secondary | ICD-10-CM | POA: Diagnosis not present

## 2020-12-23 DIAGNOSIS — G9340 Encephalopathy, unspecified: Secondary | ICD-10-CM | POA: Diagnosis not present

## 2020-12-23 DIAGNOSIS — R55 Syncope and collapse: Secondary | ICD-10-CM | POA: Diagnosis not present

## 2020-12-23 DIAGNOSIS — R531 Weakness: Secondary | ICD-10-CM | POA: Diagnosis not present

## 2020-12-23 DIAGNOSIS — I6529 Occlusion and stenosis of unspecified carotid artery: Secondary | ICD-10-CM | POA: Diagnosis not present

## 2020-12-23 DIAGNOSIS — Y998 Other external cause status: Secondary | ICD-10-CM | POA: Diagnosis not present

## 2020-12-23 DIAGNOSIS — E872 Acidosis: Secondary | ICD-10-CM | POA: Diagnosis not present

## 2020-12-23 DIAGNOSIS — G894 Chronic pain syndrome: Secondary | ICD-10-CM | POA: Diagnosis not present

## 2020-12-23 DIAGNOSIS — R220 Localized swelling, mass and lump, head: Secondary | ICD-10-CM | POA: Diagnosis not present

## 2020-12-23 DIAGNOSIS — R52 Pain, unspecified: Secondary | ICD-10-CM | POA: Diagnosis not present

## 2020-12-23 DIAGNOSIS — R2689 Other abnormalities of gait and mobility: Secondary | ICD-10-CM | POA: Diagnosis not present

## 2020-12-23 DIAGNOSIS — F10929 Alcohol use, unspecified with intoxication, unspecified: Secondary | ICD-10-CM | POA: Diagnosis not present

## 2020-12-23 DIAGNOSIS — G9389 Other specified disorders of brain: Secondary | ICD-10-CM | POA: Diagnosis not present

## 2020-12-24 DIAGNOSIS — R7989 Other specified abnormal findings of blood chemistry: Secondary | ICD-10-CM | POA: Insufficient documentation

## 2020-12-24 DIAGNOSIS — R9431 Abnormal electrocardiogram [ECG] [EKG]: Secondary | ICD-10-CM | POA: Diagnosis not present

## 2020-12-24 DIAGNOSIS — F10129 Alcohol abuse with intoxication, unspecified: Secondary | ICD-10-CM | POA: Diagnosis not present

## 2020-12-24 DIAGNOSIS — G9340 Encephalopathy, unspecified: Secondary | ICD-10-CM | POA: Diagnosis not present

## 2020-12-24 DIAGNOSIS — E872 Acidosis: Secondary | ICD-10-CM | POA: Diagnosis not present

## 2020-12-24 DIAGNOSIS — R7402 Elevation of levels of lactic acid dehydrogenase (LDH): Secondary | ICD-10-CM | POA: Diagnosis not present

## 2020-12-24 LAB — DRUG TOX MONITOR 1 W/CONF, ORAL FLD
Amphetamines: NEGATIVE ng/mL (ref <10)
Barbiturates: NEGATIVE ng/mL (ref <10)
Benzodiazepines: NEGATIVE ng/mL (ref <0.50)
Buprenorphine: NEGATIVE ng/mL (ref <0.10)
Cocaine: NEGATIVE ng/mL (ref <5.0)
Codeine: NEGATIVE ng/mL (ref <2.5)
Dihydrocodeine: NEGATIVE ng/mL (ref <2.5)
Fentanyl: NEGATIVE ng/mL (ref <0.10)
Heroin Metabolite: NEGATIVE ng/mL (ref <1.0)
Hydrocodone: NEGATIVE ng/mL (ref <2.5)
Hydromorphone: NEGATIVE ng/mL (ref <2.5)
MARIJUANA: NEGATIVE ng/mL (ref <2.5)
MDMA: NEGATIVE ng/mL (ref <10)
Meprobamate: NEGATIVE ng/mL (ref <2.5)
Methadone: NEGATIVE ng/mL (ref <5.0)
Morphine: NEGATIVE ng/mL (ref <2.5)
Nicotine Metabolite: NEGATIVE ng/mL (ref <5.0)
Norhydrocodone: NEGATIVE ng/mL (ref <2.5)
Noroxycodone: NEGATIVE ng/mL (ref <2.5)
Opiates: NEGATIVE ng/mL (ref <2.5)
Oxymorphone: NEGATIVE ng/mL (ref <2.5)
Phencyclidine: NEGATIVE ng/mL (ref <10)
Tapentadol: NEGATIVE ng/mL (ref <5.0)
Tramadol: NEGATIVE ng/mL (ref <5.0)
Zolpidem: NEGATIVE ng/mL (ref <5.0)

## 2020-12-24 LAB — DRUG TOX ALC METAB W/CON, ORAL FLD: Alcohol Metabolite: NEGATIVE ng/mL (ref <25)

## 2020-12-25 DIAGNOSIS — R7402 Elevation of levels of lactic acid dehydrogenase (LDH): Secondary | ICD-10-CM | POA: Diagnosis not present

## 2020-12-25 DIAGNOSIS — E872 Acidosis: Secondary | ICD-10-CM | POA: Diagnosis not present

## 2020-12-25 DIAGNOSIS — I517 Cardiomegaly: Secondary | ICD-10-CM | POA: Diagnosis not present

## 2020-12-25 DIAGNOSIS — G9340 Encephalopathy, unspecified: Secondary | ICD-10-CM | POA: Diagnosis not present

## 2020-12-25 DIAGNOSIS — F10129 Alcohol abuse with intoxication, unspecified: Secondary | ICD-10-CM | POA: Diagnosis not present

## 2020-12-26 ENCOUNTER — Other Ambulatory Visit: Payer: Self-pay

## 2020-12-26 ENCOUNTER — Ambulatory Visit: Payer: Medicare HMO | Admitting: Family Medicine

## 2020-12-26 ENCOUNTER — Telehealth: Payer: Self-pay | Admitting: *Deleted

## 2020-12-26 ENCOUNTER — Encounter (HOSPITAL_COMMUNITY): Payer: Self-pay | Admitting: Emergency Medicine

## 2020-12-26 ENCOUNTER — Emergency Department (HOSPITAL_COMMUNITY): Payer: Medicare HMO

## 2020-12-26 ENCOUNTER — Emergency Department (HOSPITAL_COMMUNITY)
Admission: EM | Admit: 2020-12-26 | Discharge: 2020-12-26 | Disposition: A | Discharge status: Home / Self Care | Payer: Medicare HMO | Attending: Emergency Medicine | Admitting: Emergency Medicine

## 2020-12-26 DIAGNOSIS — R609 Edema, unspecified: Secondary | ICD-10-CM | POA: Diagnosis not present

## 2020-12-26 DIAGNOSIS — R400 Somnolence: Secondary | ICD-10-CM

## 2020-12-26 DIAGNOSIS — J45909 Unspecified asthma, uncomplicated: Secondary | ICD-10-CM | POA: Insufficient documentation

## 2020-12-26 DIAGNOSIS — I10 Essential (primary) hypertension: Secondary | ICD-10-CM | POA: Diagnosis not present

## 2020-12-26 DIAGNOSIS — R7402 Elevation of levels of lactic acid dehydrogenase (LDH): Secondary | ICD-10-CM | POA: Diagnosis not present

## 2020-12-26 DIAGNOSIS — R0602 Shortness of breath: Secondary | ICD-10-CM | POA: Diagnosis not present

## 2020-12-26 DIAGNOSIS — Z20822 Contact with and (suspected) exposure to covid-19: Secondary | ICD-10-CM | POA: Diagnosis not present

## 2020-12-26 DIAGNOSIS — F1010 Alcohol abuse, uncomplicated: Secondary | ICD-10-CM | POA: Insufficient documentation

## 2020-12-26 DIAGNOSIS — R42 Dizziness and giddiness: Secondary | ICD-10-CM

## 2020-12-26 DIAGNOSIS — R079 Chest pain, unspecified: Secondary | ICD-10-CM | POA: Insufficient documentation

## 2020-12-26 DIAGNOSIS — F0781 Postconcussional syndrome: Secondary | ICD-10-CM

## 2020-12-26 DIAGNOSIS — E119 Type 2 diabetes mellitus without complications: Secondary | ICD-10-CM | POA: Insufficient documentation

## 2020-12-26 DIAGNOSIS — E872 Acidosis: Secondary | ICD-10-CM | POA: Diagnosis not present

## 2020-12-26 DIAGNOSIS — Z79899 Other long term (current) drug therapy: Secondary | ICD-10-CM | POA: Insufficient documentation

## 2020-12-26 DIAGNOSIS — R0789 Other chest pain: Secondary | ICD-10-CM | POA: Diagnosis not present

## 2020-12-26 LAB — I-STAT VENOUS BLOOD GAS, ED
Acid-Base Excess: 5 mmol/L — ABNORMAL HIGH (ref 0.0–2.0)
Bicarbonate: 30.9 mmol/L — ABNORMAL HIGH (ref 20.0–28.0)
Calcium, Ion: 1.14 mmol/L — ABNORMAL LOW (ref 1.15–1.40)
HCT: 42 % (ref 39.0–52.0)
Hemoglobin: 14.3 g/dL (ref 13.0–17.0)
O2 Saturation: 95 %
Potassium: 4.2 mmol/L (ref 3.5–5.1)
Sodium: 138 mmol/L (ref 135–145)
TCO2: 32 mmol/L (ref 22–32)
pCO2, Ven: 47.9 mmHg (ref 44.0–60.0)
pH, Ven: 7.418 (ref 7.250–7.430)
pO2, Ven: 76 mmHg — ABNORMAL HIGH (ref 32.0–45.0)

## 2020-12-26 LAB — URINALYSIS, ROUTINE W REFLEX MICROSCOPIC
Bilirubin Urine: NEGATIVE
Glucose, UA: NEGATIVE mg/dL
Hgb urine dipstick: NEGATIVE
Ketones, ur: NEGATIVE mg/dL
Leukocytes,Ua: NEGATIVE
Nitrite: NEGATIVE
Protein, ur: NEGATIVE mg/dL
Specific Gravity, Urine: 1.016 (ref 1.005–1.030)
pH: 6 (ref 5.0–8.0)

## 2020-12-26 LAB — BASIC METABOLIC PANEL
Anion gap: 12 (ref 5–15)
BUN: 10 mg/dL (ref 6–20)
CO2: 26 mmol/L (ref 22–32)
Calcium: 9.5 mg/dL (ref 8.9–10.3)
Chloride: 98 mmol/L (ref 98–111)
Creatinine, Ser: 1.05 mg/dL (ref 0.61–1.24)
GFR, Estimated: >60 mL/min (ref >60)
Glucose, Bld: 101 mg/dL — ABNORMAL HIGH (ref 70–99)
Potassium: 4 mmol/L (ref 3.5–5.1)
Sodium: 136 mmol/L (ref 135–145)

## 2020-12-26 LAB — CBC
HCT: 46.6 % (ref 39.0–52.0)
Hemoglobin: 15.6 g/dL (ref 13.0–17.0)
MCH: 33.2 pg (ref 26.0–34.0)
MCHC: 33.5 g/dL (ref 30.0–36.0)
MCV: 99.1 fL (ref 80.0–100.0)
Platelets: 186 10*3/uL (ref 150–400)
RBC: 4.7 MIL/uL (ref 4.22–5.81)
RDW: 13.3 % (ref 11.5–15.5)
WBC: 7.2 10*3/uL (ref 4.0–10.5)
nRBC: 0 % (ref 0.0–0.2)

## 2020-12-26 LAB — RAPID URINE DRUG SCREEN, HOSP PERFORMED
Amphetamines: NOT DETECTED
Barbiturates: NOT DETECTED
Benzodiazepines: POSITIVE — AB
Cocaine: NOT DETECTED
Opiates: NOT DETECTED
Tetrahydrocannabinol: NOT DETECTED

## 2020-12-26 LAB — RESP PANEL BY RT-PCR (FLU A&B, COVID) ARPGX2
Influenza A by PCR: NEGATIVE
Influenza B by PCR: NEGATIVE
SARS Coronavirus 2 by RT PCR: NEGATIVE

## 2020-12-26 LAB — TROPONIN I (HIGH SENSITIVITY)
Troponin I (High Sensitivity): 5 ng/L (ref <18)
Troponin I (High Sensitivity): 4 ng/L (ref <18)

## 2020-12-26 LAB — ETHANOL: Alcohol, Ethyl (B): <10 mg/dL (ref <10)

## 2020-12-26 LAB — AMMONIA: Ammonia: 26 umol/L (ref 9–35)

## 2020-12-26 LAB — BRAIN NATRIURETIC PEPTIDE: B Natriuretic Peptide: 13.6 pg/mL (ref 0.0–100.0)

## 2020-12-26 LAB — CK: Total CK: 107 U/L (ref 49–397)

## 2020-12-26 NOTE — Discharge Instructions (Addendum)
Your excessive sleepiness can be combination of multiple factors.  You are on a lot of sedatives and gabapentin so please reduce the dose of gabapentin as he was told at [facility].  It is also possible that you have a postconcussive syndrome  I have asked case management to set up home health for you.  See your doctor for follow-up.  Return to ER if you have increased lethargy, vomiting, fever, trouble breathing, leg swelling, chest pain

## 2020-12-26 NOTE — ED Triage Notes (Signed)
Patient here with complaint of chest pain that started on Sunday and has gotten progressively worse. History of CHF. Patient alert, oriented, and in no apparent distress at this time.

## 2020-12-26 NOTE — Care Management (Incomplete)
ED RNCM met with patient and spouse at bedside to disuss [facility] recommendations, Noted ED evaluation no significant findings patient not meeting criteria for inpatient admission.Option is [facility] service for reconditioning.Patient and spouse agreeable. Discussed HH within [facility] was selected and referral faxed via [facility].

## 2020-12-26 NOTE — ED Notes (Signed)
Pt transported to CT ?

## 2020-12-26 NOTE — ED Provider Notes (Signed)
[facility] EMERGENCY DEPARTMENT Provider Note   CSN: [id number] Arrival date & time: 12/26/20  7782     History Chief Complaint  Patient presents with  . Chest Pain    Daniel Durham is a 55 y.o. male history of obesity, chronic back pain status post stimulator, here presenting with multiple complaints.  Patient was drinking alcohol 3 days ago and apparently found passed out on the floor.  He then went to [facility] at that time and was found to have a lactic acidosis and alcohol over 300.  Patient was given 3 L normal saline bolus.  His lactic acidosis remained the same and was thought to be secondary to liver dysfunction from alcohol use.  Patient had an echo yesterday that showed a EF of 45%.  Patient is here because he wants to get admitted now.  He states that he was admitted for observation at that time and was sent home today.  He states that he is persistently weak and dizzy and he also has been falling asleep.  He states that it is hard for him to walk.  He adamantly denies drinking more alcohol since discharge today.  He states that he did not overdose on his meds.  Denies any fevers.  He states that he has some chest pain and subjective shortness of breath.  The history is provided by the patient.       Past Medical History:  Diagnosis Date  . Allergy   . Anxiety   . Asthma    SEASONAL   . Blood transfusion without reported diagnosis   . Chronic back pain    Neurostimulator  . Depression   . Environmental allergies   . Hypertension   . Neuromuscular disorder ([place])   . Neuropathy   . Seasonal allergic conjunctivitis     Patient Active Problem List   Diagnosis Date Noted  . Asthma 12/12/2020  . Hyperlipidemia 12/12/2020  . Type 2 diabetes mellitus ([place]) 12/12/2020  . Gout 04/16/2020  . Ankle fracture, bimalleolar, closed, left, initial encounter 02/15/2020  . Morbid obesity ([place]) 12/27/2019  . HTN (hypertension) 11/18/2019   . Anxiety and depression 03/03/2017  . Cervical disc disorder with radiculopathy of cervical region 07/24/2015  . 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 Chronic back pain greater than 3 months duration 01/23/2015  . 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 Amputation stump complication ([place]) 42/35/3614    Past Surgical History:  Procedure Laterality Date  . BACK SURGERY    . CARDIAC CATHETERIZATION     11/05/10 [facility], [place]): Briding of mLAD, no sign disease. EF 45% in RAO, 60% LAO (51% stress, 54% rest by NM stress; 57-59% echo 10/2010)  . CARPAL TUNNEL RELEASE     Bilateral  . CERVICAL DISCECTOMY  12/17/2017   titanium plates 12-17-17  . COLONOSCOPY    . HAND AMPUTATION  2007  . ORIF ANKLE FRACTURE Left 02/23/2020   Procedure: OPEN REDUCTION INTERNAL FIXATION (ORIF) ANKLE FRACTURE;  Surgeon: Nicholes Stairs, MD;  Location: [facility];  Service: Orthopedics;  Laterality: Left;  . POLYPECTOMY     pt states had colon in [place] and he had polyps- he has no idea what type   . ROTATOR CUFF REPAIR     Left  . SPINAL CORD STIMULATOR IMPLANT    . SPINAL CORD STIMULATOR REMOVAL N/A 10/26/2015  Procedure: LUMBAR SPINAL CORD STIMULATOR REMOVAL;  Surgeon: Clydell Hakim, MD;  Location: [facility];  Service: Neurosurgery;  Laterality: N/A;       Family History  Problem Relation Age of Onset  . Diabetes Mother        Living  . Sleep apnea Mother   . Diabetes Father 34       Deceased  . Hypertension Father   . Jaundice Father   . Hemophilia Father   . Alcoholism Father   . Cancer Maternal Grandfather        Throat  . Esophageal cancer Maternal Grandfather   . Cancer Paternal Uncle        Throat  . Esophageal cancer Paternal Uncle   . Asthma Sister   . Diabetes Sister   . Healthy Son        X1  . Healthy Daughter        X1  . Colon polyps Neg Hx   .  Colon cancer Neg Hx   . Rectal cancer Neg Hx   . Stomach cancer Neg Hx     Social History   Tobacco Use  . Smoking status: Never Smoker  . Smokeless tobacco: Never Used  Vaping Use  . Vaping Use: Never used  Substance Use Topics  . Alcohol use: Yes    Alcohol/week: 0.0 standard drinks    Comment: occ  . Drug use: No    Home Medications Prior to Admission medications   Medication Sig Start Date End Date Taking? Authorizing Provider  albuterol (VENTOLIN HFA) 108 (90 Base) MCG/ACT inhaler Inhale 1-2 puffs into the lungs every 6 (six) hours as needed for wheezing or shortness of breath. 09/26/20   Brunetta Jeans, PA-C  allopurinol (ZYLOPRIM) 100 MG tablet Take 1 tablet (100 mg total) by mouth daily. 10/09/20   Brunetta Jeans, PA-C  ALPRAZolam Duanne Moron) 0.25 MG tablet Take 1 tablet (0.25 mg total) by mouth 2 (two) times daily as needed for anxiety. 12/03/20   Brunetta Jeans, PA-C  atorvastatin (LIPITOR) 10 MG tablet TAKE 1 TABLET(10 MG) BY MOUTH DAILY 10/09/20   Brunetta Jeans, PA-C  cyclobenzaprine (FLEXERIL) 10 MG tablet Take 1 tablet (10 mg total) by mouth at bedtime. Patient taking differently: Take 10 mg by mouth at bedtime as needed for muscle spasms. 06/29/20   Brunetta Jeans, PA-C  gabapentin (NEURONTIN) 300 MG capsule Take 1 capsule each morning and midday along with your 600 mg dose to make a total of 900 mg Patient taking differently: Take 300 mg by mouth daily. Taking daily at noon along with 600mg  = 900mg  02/06/20   Brunetta Jeans, PA-C  gabapentin (NEURONTIN) 600 MG tablet TAKE 1 TABLET(600 MG) BY MOUTH THREE TIMES DAILY 12/18/20   Haydee Salter, MD  ipratropium-albuterol (DUONEB) 0.5-2.5 (3) MG/3ML SOLN Take 3 mLs by nebulization every 8 (eight) hours as needed (shortness of breath, wheezing). 09/26/20   Brunetta Jeans, PA-C  lisinopril (ZESTRIL) 20 MG tablet Take 1 tablet (20 mg total) by mouth daily. 10/09/20   Brunetta Jeans, PA-C  montelukast (SINGULAIR)  10 MG tablet Take 1 tablet (10 mg total) by mouth daily. 10/09/20   Brunetta Jeans, PA-C  nortriptyline (PAMELOR) 25 MG capsule Take 1 capsule (25 mg total) by mouth at bedtime. 12/19/20   Raulkar, Clide Deutscher, MD  Oxycodone HCl 10 MG TABS Take 1 tablet (10 mg total) by mouth every 6 (six) hours as needed. 12/19/20  Izora Ribas, MD  oxyCODONE-acetaminophen (PERCOCET) 10-325 MG tablet Take 1 tablet by mouth every 6 (six) hours as needed for pain. 11/06/20   Raulkar, Clide Deutscher, MD  sertraline (ZOLOFT) 50 MG tablet Take 2 tablets (100 mg total) by mouth daily. 12/11/20 03/11/21  Haydee Salter, MD  Vitamin D, Ergocalciferol, (DRISDOL) 1.25 MG (50000 UNIT) CAPS capsule Take 1 capsule (50,000 Units total) by mouth every 7 (seven) days. 12/05/20   Midge Minium, MD    Allergies    Aspirin and Hydrocodone  Review of Systems   Review of Systems  Cardiovascular: Positive for chest pain.  All other systems reviewed and are negative.   Physical Exam Updated Vital Signs BP 125/89   Pulse 67   Temp 98.7 F (37.1 C) (Oral)   Resp 19   SpO2 94%   Physical Exam Vitals and nursing note reviewed.  Constitutional:      Comments: Tired and chronically ill  HENT:     Head: Normocephalic.  Eyes:     Extraocular Movements: Extraocular movements intact.     Pupils: Pupils are equal, round, and reactive to light.  Cardiovascular:     Rate and Rhythm: Normal rate and regular rhythm.     Heart sounds: Normal heart sounds.  Pulmonary:     Effort: Pulmonary effort is normal.     Comments: Diminished bilateral bases no respiratory distress Abdominal:     General: Bowel sounds are normal.     Palpations: Abdomen is soft.  Musculoskeletal:     Cervical back: Normal range of motion and neck supple.     Comments: Trace edema bilaterally  Skin:    General: Skin is warm.     Capillary Refill: Capillary refill takes less than 2 seconds.  Neurological:     Comments: Sleepy but arousable.  Patient  moving all extremities.  No obvious tremors.  Psychiatric:     Comments: Unable      ED Results / Procedures / Treatments   Labs (all labs ordered are listed, but only abnormal results are displayed) Labs Reviewed  BASIC METABOLIC PANEL - Abnormal; Notable for the following components:      Result Value   Glucose, Bld 101 (*)    All other components within normal limits  RESP PANEL BY RT-PCR (FLU A&B, COVID) ARPGX2  CBC  BRAIN NATRIURETIC PEPTIDE  AMMONIA  BLOOD GAS, ARTERIAL  ETHANOL  URINALYSIS, ROUTINE W REFLEX MICROSCOPIC  RAPID URINE DRUG SCREEN, HOSP PERFORMED  CK  TROPONIN I (HIGH SENSITIVITY)    EKG EKG Interpretation  Date/Time:  Wednesday December 26 2020 18:35:20 EST Ventricular Rate:  73 PR Interval:  142 QRS Duration: 90 QT Interval:  382 QTC Calculation: 420 R Axis:   117 Text Interpretation: Normal sinus rhythm Low voltage QRS Left posterior fascicular block Cannot rule out Anterior infarct , age undetermined Abnormal ECG No significant change since last tracing Confirmed by Wandra Arthurs [phone]) on 12/26/2020 7:40:46 PM   Radiology DG Chest 2 View  Result Date: 12/26/2020 CLINICAL DATA:  Chest pain EXAM: CHEST - 2 VIEW COMPARISON:  12/23/2020 FINDINGS: Frontal and lateral views of the chest demonstrate a stable cardiac silhouette. Mild chronic central vascular congestion without airspace disease, effusion, or pneumothorax. No acute bony abnormalities. IMPRESSION: 1. Chronic central vascular congestion without overt edema. Electronically Signed   By: Randa Ngo M.D.   On: 12/26/2020 19:25    Procedures Procedures   Medications Ordered in ED Medications -  No data to display  ED Course  I have reviewed the triage vital signs and the nursing notes.  Pertinent labs & imaging results that were available during my care of the patient were reviewed by me and considered in my medical decision making (see chart for details).    MDM  Rules/Calculators/A&P                          Coree Brame is a 55 y.o. male presenting with multiple complaints.  Overall patient appears very sedated.  He is on multiple pain medicine and muscle relaxants.  I wonder if he overdosed on his medicines.  He was given 3 L normal saline bolus recently for alcohol intoxication I wonder if he went to heart failure.  His echo yesterday showed EF of 45%.  Will check CBC and BMP and EtOH and UDS and BNP.  We will repeat CT head and get ammonia level as well.  May need to consult social work as well.  10:28 PM Labs unremarkable.  UDS is positive for benzos.  He is VBG is reassuring.  He CT head was unremarkable.  Patient appears sleepy and I think it is polypharmacy.  He is on pain medicine and muscle relaxants and gabapentin.  He was told to decrease his gabapentin at [facility].  Of note, patient desats to upper 80s when he is sleeping.  He is obese and I suspect some underlying sleep apnea.  Case management talk to the patient.  I think at this point, he is stable to get vestibular rehab at home.  I also told family that he is not to get too much pain medicine.  I wonder if his symptoms are a combination of polypharmacy and postconcussive syndrome     Final Clinical Impression(s) / ED Diagnoses Final diagnoses:  None    Rx / DC Orders ED Discharge Orders    None       Drenda Freeze, MD 12/26/20 2232

## 2020-12-26 NOTE — Telephone Encounter (Signed)
Insufficient saliva to complete test.  Suggest repeat with urine test instead.

## 2020-12-27 ENCOUNTER — Encounter: Payer: Medicare HMO | Admitting: Physical Medicine & Rehabilitation

## 2020-12-28 ENCOUNTER — Other Ambulatory Visit: Payer: Self-pay | Admitting: Family Medicine

## 2020-12-28 ENCOUNTER — Telehealth: Payer: Self-pay | Admitting: Family Medicine

## 2020-12-28 ENCOUNTER — Encounter: Payer: Self-pay | Admitting: Family Medicine

## 2020-12-28 DIAGNOSIS — I429 Cardiomyopathy, unspecified: Secondary | ICD-10-CM

## 2020-12-28 DIAGNOSIS — F1010 Alcohol abuse, uncomplicated: Secondary | ICD-10-CM | POA: Insufficient documentation

## 2020-12-28 NOTE — Telephone Encounter (Signed)
Please see message . Thank you .

## 2020-12-28 NOTE — Telephone Encounter (Signed)
Noted. Ask Ms. Breck Coons to try and accommodate the patients schedule if possible.

## 2020-12-28 NOTE — Telephone Encounter (Signed)
[facility] [phone]) is calling to let the office know that they were scheduled to see patient within a 48 hour window, but patient refused and states he wants them to come on Wednesday when his wife is home. Please advise.

## 2020-12-31 ENCOUNTER — Other Ambulatory Visit: Payer: Self-pay | Admitting: Family Medicine

## 2020-12-31 DIAGNOSIS — E119 Type 2 diabetes mellitus without complications: Secondary | ICD-10-CM

## 2020-12-31 DIAGNOSIS — E782 Mixed hyperlipidemia: Secondary | ICD-10-CM

## 2021-01-01 ENCOUNTER — Other Ambulatory Visit: Payer: Self-pay

## 2021-01-01 ENCOUNTER — Encounter: Payer: Self-pay | Admitting: Family Medicine

## 2021-01-01 ENCOUNTER — Ambulatory Visit (INDEPENDENT_AMBULATORY_CARE_PROVIDER_SITE_OTHER): Payer: Medicare HMO | Admitting: Family Medicine

## 2021-01-01 ENCOUNTER — Telehealth: Payer: Self-pay | Admitting: Family Medicine

## 2021-01-01 VITALS — BP 116/78 | HR 74 | Temp 97.7°F | Ht 67.0 in | Wt 267.4 lb

## 2021-01-01 DIAGNOSIS — F1010 Alcohol abuse, uncomplicated: Secondary | ICD-10-CM | POA: Diagnosis not present

## 2021-01-01 DIAGNOSIS — E785 Hyperlipidemia, unspecified: Secondary | ICD-10-CM | POA: Diagnosis not present

## 2021-01-01 DIAGNOSIS — F419 Anxiety disorder, unspecified: Secondary | ICD-10-CM

## 2021-01-01 DIAGNOSIS — E119 Type 2 diabetes mellitus without complications: Secondary | ICD-10-CM

## 2021-01-01 DIAGNOSIS — I10 Essential (primary) hypertension: Secondary | ICD-10-CM | POA: Diagnosis not present

## 2021-01-01 DIAGNOSIS — F32A Depression, unspecified: Secondary | ICD-10-CM | POA: Diagnosis not present

## 2021-01-01 MED ORDER — ATORVASTATIN CALCIUM 20 MG PO TABS: 20.0000 mg | ORAL_TABLET | Freq: Every day | ORAL | 3 refills | Status: AC

## 2021-01-01 MED ORDER — ALPRAZOLAM 0.25 MG PO TABS: 0.2500 mg | ORAL_TABLET | Freq: Every day | ORAL | 0 refills | Status: DC

## 2021-01-01 NOTE — Progress Notes (Signed)
  Chronic Care Management   Note  01/01/2021 Name: Sherod Cisse MRN: [id number] DOB: Sep 14, 1966  Phuoc Huy is a 55 y.o. year old male who is a primary care patient of Gena Fray, Lillette Boxer, MD. I reached out to [employer] by phone today in response to a referral sent by Mr. Hilario Sienkiewicz's PCP, Gena Fray, Lillette Boxer, MD.   Mr. Formica was given information about Chronic Care Management services today including:  1. CCM service includes personalized support from designated clinical staff supervised by his physician, including individualized plan of care and coordination with other care providers 2. 24/7 contact phone numbers for assistance for urgent and routine care needs. 3. Service will only be billed when office clinical staff spend 20 minutes or more in a month to coordinate care. 4. Only one practitioner may furnish and bill the service in a calendar month. 5. The patient may stop CCM services at any time (effective at the end of the month) by phone call to the office staff.   Patient agreed to services and verbal consent obtained.   Follow up plan:   Carley Perdue [employer] Scheduler

## 2021-01-01 NOTE — Progress Notes (Signed)
[facility] [address] [place] [address] Dept: [phone] Dept Fax: [phone]  Chronic Care Office Visit  Subjective:    Patient ID: Daniel Durham, male    DOB: 1966/01/20, 55 y.o..   MRN: [id number]  Chief Complaint  Patient presents with  . Follow-up    2 week follow up on labs.    History of Present Illness:  Patient is in today for reassessment of his chronic medical issues. Mr. Peixoto was seen to establish care on 12/11/2020. His screening labs showed an HgbA1c of 7.0, indicating a new diagnosis of Type 2 DM.  Since his last visit, he has had two ED visits. At the first, he was found unconscious at home, having vomited. The ED note described him having been found with multiple empty beer cans and liquor bottles. His BAC was reported as 307. A head CT showed no internal injury, but a large hematoma from apparently strinking his head when he lost consciousness. He was also noted to have concern for a metabolic acidosis. He had a bicarb of 21 and an elevated lactate level of 3.43. However, his pH was normal on his ABG. His CXR showed cardiomegaly with vascular congestion and pulmonary edema. He was admitted for observation. He had an echocardiogram that showed an EF= 45-50%, mild global hypokinesis of the left ventricle,and mild dilation of the right ventricle. He was discharged on Lasix and asked to taper his gabapentin and alprazolam.  Mr. Kissler was seen back several days later in the ED with dizziness. His labs were unremarkable at that point, including a BNP= 13.6. He was given IV fluids and discharged.  Mr. Ramsay admits to past alcohol and drug abuse. However, he notes that he hasn't used illicit drugs for many years (his UDSs have been normal). He notes only minimal alcohol use, esp. compared to previous amounts. He mainly drinks beer, so notes use of liquor would be rare. He states he did go to AA and  alcohol treatment int he past and does not feel like either was helpful. He expresses a firm commitment to quitting alcohol use at this point, having seen this event as a wake-up call.  Past Medical History: Patient Active Problem List   Diagnosis Date Noted  . Alcohol abuse 12/28/2020  . Cardiomyopathy ([place]) 12/28/2020  . Elevated lactic acid level 12/24/2020  . Asthma 12/12/2020  . Hyperlipidemia 12/12/2020  . Type 2 diabetes mellitus ([place]) 12/12/2020  . Gout 04/16/2020  . Ankle fracture, bimalleolar, closed, left, initial encounter 02/15/2020  . Morbid obesity ([place]) 12/27/2019  . Essential hypertension 11/18/2019  . Anxiety and depression 03/03/2017  . Cervical disc disorder with radiculopathy of cervical region 07/24/2015  . 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 Chronic back pain greater than 3 months duration 01/23/2015  . 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 Amputation stump complication ([place]) 03/47/4259    Past Surgical History:  Procedure Laterality Date  . BACK SURGERY    . CARDIAC CATHETERIZATION     11/05/10 [facility], [place]): Briding of mLAD, no sign disease. EF 45% in RAO, 60% LAO (51% stress, 54% rest by NM stress; 57-59% echo 10/2010)  . CARPAL TUNNEL RELEASE     Bilateral  . CERVICAL DISCECTOMY  12/17/2017   titanium plates 12-17-17  . COLONOSCOPY    . HAND AMPUTATION  2007  . ORIF ANKLE  FRACTURE Left 02/23/2020   Procedure: OPEN REDUCTION INTERNAL FIXATION (ORIF) ANKLE FRACTURE;  Surgeon: Nicholes Stairs, MD;  Location: [facility];  Service: Orthopedics;  Laterality: Left;  . POLYPECTOMY     pt states had colon in [place] and he had polyps- he has no idea what type   . ROTATOR CUFF REPAIR     Left  . SPINAL CORD STIMULATOR IMPLANT    . SPINAL CORD STIMULATOR REMOVAL N/A 10/26/2015   Procedure: LUMBAR SPINAL CORD STIMULATOR REMOVAL;   Surgeon: Clydell Hakim, MD;  Location: [facility];  Service: Neurosurgery;  Laterality: N/A;   Family History  Problem Relation Age of Onset  . Diabetes Mother        Living  . Sleep apnea Mother   . Diabetes Father 65       Deceased  . Hypertension Father   . Jaundice Father   . Hemophilia Father   . Alcoholism Father   . Cancer Maternal Grandfather        Throat  . Esophageal cancer Maternal Grandfather   . Cancer Paternal Uncle        Throat  . Esophageal cancer Paternal Uncle   . Asthma Sister   . Diabetes Sister   . Healthy Son        X1  . Healthy Daughter        X1  . Colon polyps Neg Hx   . Colon cancer Neg Hx   . Rectal cancer Neg Hx   . Stomach cancer Neg Hx    Outpatient Medications Prior to Visit  Medication Sig Dispense Refill  . albuterol (VENTOLIN HFA) 108 (90 Base) MCG/ACT inhaler Inhale 1-2 puffs into the lungs every 6 (six) hours as needed for wheezing or shortness of breath. 18 g 2  . allopurinol (ZYLOPRIM) 100 MG tablet Take 1 tablet (100 mg total) by mouth daily. 90 tablet 1  . cyclobenzaprine (FLEXERIL) 10 MG tablet Take 1 tablet (10 mg total) by mouth at bedtime. (Patient taking differently: Take 10 mg by mouth at bedtime as needed for muscle spasms.) 30 tablet 0  . furosemide (LASIX) 20 MG tablet Take by mouth.    . gabapentin (NEURONTIN) 300 MG capsule Take 1 capsule each morning and midday along with your 600 mg dose to make a total of 900 mg (Patient taking differently: Take 300 mg by mouth daily. Taking daily at noon along with 600mg  = 900mg ) 60 capsule 3  . gabapentin (NEURONTIN) 600 MG tablet TAKE 1 TABLET(600 MG) BY MOUTH THREE TIMES DAILY 270 tablet 0  . ipratropium-albuterol (DUONEB) 0.5-2.5 (3) MG/3ML SOLN Take 3 mLs by nebulization every 8 (eight) hours as needed (shortness of breath, wheezing). 3 mL 11  . lisinopril (ZESTRIL) 20 MG tablet Take 1 tablet (20 mg total) by mouth daily. 90 tablet 1  . montelukast (SINGULAIR) 10 MG tablet Take 1  tablet (10 mg total) by mouth daily. 90 tablet 1  . nortriptyline (PAMELOR) 25 MG capsule Take 1 capsule (25 mg total) by mouth at bedtime. 30 capsule 1  . Oxycodone HCl 10 MG TABS Take 1 tablet (10 mg total) by mouth every 6 (six) hours as needed. 120 tablet 0  . oxyCODONE-acetaminophen (PERCOCET) 10-325 MG tablet Take 1 tablet by mouth every 6 (six) hours as needed for pain. 120 tablet 0  . potassium chloride (KLOR-CON) 10 MEQ tablet Take by mouth.    . sertraline (ZOLOFT) 50 MG tablet Take 2 tablets (100 mg total)  by mouth daily. 180 tablet 0  . vitamin B-12 (CYANOCOBALAMIN) 500 MCG tablet Take by mouth.    . Vitamin D, Ergocalciferol, (DRISDOL) 1.25 MG (50000 UNIT) CAPS capsule Take 1 capsule (50,000 Units total) by mouth every 7 (seven) days. 5 capsule 3  . ALPRAZolam (XANAX) 0.25 MG tablet Take 1 tablet (0.25 mg total) by mouth 2 (two) times daily as needed for anxiety. 30 tablet 0  . atorvastatin (LIPITOR) 10 MG tablet TAKE 1 TABLET(10 MG) BY MOUTH DAILY 90 tablet 1   No facility-administered medications prior to visit.   Allergies  Allergen Reactions  . Aspirin Swelling  . Hydrocodone Itching    Objective:   Today's Vitals   01/01/21 0811  BP: 116/78  Pulse: 74  Temp: 97.7 F (36.5 C)  TempSrc: Temporal  SpO2: 95%  Weight: 267 lb 6.4 oz (121.3 kg)  Height: 5\' 7"  (1.702 m)   Body mass index is 41.88 kg/m.   General: Well developed, well nourished. No acute distress. Psych: Alert and oriented. Normal mood and affect.  Health Maintenance Due  Topic Date Due  . PNEUMOCOCCAL POLYSACCHARIDE VACCINE AGE 12-64 HIGH RISK  Never done  . FOOT EXAM  Never done  . OPHTHALMOLOGY EXAM  Never done  . COVID-19 Vaccine (1) Never done  . COLONOSCOPY (Pts 45-82yrs Insurance coverage will need to be confirmed)  07/29/2019     Assessment & Plan:   1. Type 2 diabetes mellitus without complication, without long-term current use of insulin (HCC) Newly diagnosed. In light of the recent  finding of an elevated lactate level, I will check his lactic acid today. If this is now normal, I would like to start him on metformin 500 mg daily. We discussed approaches to treatment of diabetes with dietary changes, exercise, weight loss, and possibly medication. He expresses a commitment to these approaches. I will refer him for diabetes teaching.  - Ambulatory referral to diabetic education  2. Hyperlipidemia, unspecified hyperlipidemia type After his last labs, I noted his lipids are not at goal. We had discussed increasing his Lipitor to 20 mg daily and will recheck his lipids in 6 months.  - atorvastatin (LIPITOR) 20 MG tablet; Take 1 tablet (20 mg total) by mouth daily.  Dispense: 90 tablet; Refill: 3  3. Anxiety and depression I am concerned about potential risk of his using alprazolam int he face of his recent alcohol binge. He is to use no more than 1 tablet daily. We did adjust his SSRI does at his last visit. I will continue the alprazolam for now, but hope to further taper amounts as this has time to take effect.  - ALPRAZolam (XANAX) 0.25 MG tablet; Take 1 tablet (0.25 mg total) by mouth daily.  Dispense: 30 tablet; Refill: 0  4. Alcohol abuse I discussed my support for his decision to stop alcohol use. I also expressed my concern for him not engaging in [place] or some other form of treatment for this condition.  We discussed various options for how we might approach his alcohol issue if he is not successful with abstinence.  - Lactic acid, plasma  5. Essential hypertension At goal.  Haydee Salter, MD

## 2021-01-02 ENCOUNTER — Telehealth: Payer: Self-pay | Admitting: Family Medicine

## 2021-01-02 LAB — LACTIC ACID, PLASMA: LACTIC ACID: 2 mmol/L — ABNORMAL HIGH (ref 0.4–1.8)

## 2021-01-02 NOTE — Telephone Encounter (Signed)
Patient in for OV on 01/01/21

## 2021-01-02 NOTE — Telephone Encounter (Signed)
[facility] is calling to get verbal orders for Nursing, Disease Management and Hypertension. Please call him back at [phone].

## 2021-01-02 NOTE — Telephone Encounter (Signed)
Spoke with Merry Proud who's calling from home health per Merry Proud patient was referred to home health from his ED visit. Merry Proud calling for orders for PT,OT and nursing. Okay to give verbal orders? Please advise.

## 2021-01-02 NOTE — Telephone Encounter (Signed)
Spoke with Daniel Durham who verbally understood message below

## 2021-01-02 NOTE — Telephone Encounter (Signed)
I don't believe Home Health is indicated at this point. The patient is tied in with other sources for chronic care management currently.

## 2021-01-03 ENCOUNTER — Other Ambulatory Visit: Payer: Self-pay | Admitting: Family Medicine

## 2021-01-03 DIAGNOSIS — E119 Type 2 diabetes mellitus without complications: Secondary | ICD-10-CM

## 2021-01-03 MED ORDER — EMPAGLIFLOZIN 10 MG PO TABS: 10.0000 mg | ORAL_TABLET | Freq: Every day | ORAL | 2 refills | Status: AC

## 2021-01-10 NOTE — Progress Notes (Deleted)
Chronic Care Management Pharmacy Note  01/10/2021 Name:  Faolan [place] MRN:  [id number] DOB:  09-29-1966  Subjective: Daniel Durham is an 55 y.o. year old male who is a primary patient of Rudd, Lillette Boxer, MD.  The CCM team was consulted for assistance with disease management and care coordination needs.    {CCMTELEPHONEFACETOFACE:21091510} for {CCMINITIALFOLLOWUPCHOICE:21091511} in response to provider referral for pharmacy case management and/or care coordination services.   Consent to Services:  {CCMCONSENTOPTIONS:25074}  Patient Care Team: Haydee Salter, MD as PCP - General (Family Medicine) Clydell Hakim, MD (Inactive) as Consulting Physician (Anesthesiology) Newman Pies, MD as Consulting Physician (Neurosurgery) Leandrew Koyanagi, MD as Attending Physician (Orthopedic Surgery) Madelin Rear, [facility] as Pharmacist (Pharmacist) Germaine Pomfret, [facility] as Pharmacist (Pharmacist)  Recent office visits: 01/01/21: Patient presented to Dr. Gena Fray for follow-up. Atorvastatin increased to 20 mg daily. Alprazolam decreased to once daily.  12/11/20: Patient presented to Dr. Gena Fray to establish care. A1c worsened to 7.0%. LDL 175. Sertraline increased to 100 mg daily, pantoprazole stopped (pt preference)   Recent consult visits: [facility] visits: 12/26/20: Patient presented to ED for vertigo. Potentially related to alcoholism.  2/20-2/23: Patient hospitalized for alcohol intoxication. Patient started on Vitamin B12, furosemide, potassium chloride. Gabapentin 600 mg, nortriptyline 1/7-1/20/22: Patient hospitalized for acute respiratory failure.   Objective:  Lab Results  Component Value Date   CREATININE 1.05 12/26/2020   BUN 10 12/26/2020   GFR 94.87 11/29/2020   GFRNONAA >60 12/26/2020   GFRAA >60 02/28/2020   NA 138 12/26/2020   K 4.2 12/26/2020   CALCIUM 9.5 12/26/2020   CO2 26 12/26/2020    Lab Results  Component Value Date/Time   HGBA1C 7.0 (H) 12/11/2020 02:03 PM    HGBA1C 5.3 02/21/2020 02:14 AM   GFR 94.87 11/29/2020 11:32 AM   GFR 84.98 04/23/2020 02:45 PM    Last diabetic Eye exam: No results found for: HMDIABEYEEXA  Last diabetic Foot exam: No results found for: HMDIABFOOTEX   Lab Results  Component Value Date   CHOL 253 (H) 12/11/2020   HDL 38.50 (L) 12/11/2020   LDLCALC 148 (H) 09/21/2019   LDLDIRECT 175.0 12/11/2020   TRIG 218.0 (H) 12/11/2020   CHOLHDL 7 12/11/2020    Hepatic Function Latest Ref Rng & Units 11/29/2020 04/23/2020 03/21/2020  Total Protein 6.0 - 8.3 g/dL 6.2 7.1 6.8  Albumin 3.5 - 5.2 g/dL 3.7 4.4 4.2  AST 0 - 37 U/L 26 25 29   ALT 0 - 53 U/L 79(H) 37 41  Alk Phosphatase 39 - 117 U/L 80 83 90  Total Bilirubin 0.2 - 1.2 mg/dL 0.4 0.7 0.4  Bilirubin, Direct 0.0 - 0.2 mg/dL - - -    Lab Results  Component Value Date/Time   TSH 0.97 11/29/2020 11:32 AM   TSH 1.120 11/18/2019 11:03 AM   TSH 1.29 02/25/2019 10:32 AM    CBC Latest Ref Rng & Units 12/26/2020 12/26/2020 11/29/2020  WBC 4.0 - 10.5 K/uL - 7.2 8.0  Hemoglobin 13.0 - 17.0 g/dL 14.3 15.6 15.0  Hematocrit 39.0 - 52.0 % 42.0 46.6 43.8  Platelets 150 - 400 K/uL - 186 185.0    Lab Results  Component Value Date/Time   VD25OH 15.34 (L) 11/29/2020 11:32 AM   VD25OH 19.23 (L) 04/11/2015 08:19 AM    Clinical ASCVD: {YES/NO:21197} The ASCVD Risk score ([place]., et al., 2013) failed to calculate for the following reasons:   The patient has a prior MI or stroke  diagnosis    Depression screen [facility] 2/9 12/11/2020 07/18/2020 02/06/2020  Decreased Interest 0 0 1  Down, Depressed, Hopeless 1 0 1  PHQ - 2 Score 1 0 2  Altered sleeping - 2 1  Tired, decreased energy - 0 1  Change in appetite - 0 0  Feeling bad or failure about yourself  - 0 0  Trouble concentrating - 1 0  Moving slowly or fidgety/restless - 0 0  Suicidal thoughts - 0 0  PHQ-9 Score - 3 4  Difficult doing work/chores - Not difficult at all Somewhat difficult  Some recent data might be hidden      ***Other: (CHADS2VASc if Afib, MMRC or CAT for COPD, ACT, DEXA)  Social History   Tobacco Use  Smoking Status Never Smoker  Smokeless Tobacco Never Used   BP Readings from Last 3 Encounters:  01/01/21 116/78  12/26/20 131/73  12/19/20 (!) 144/92   Pulse Readings from Last 3 Encounters:  01/01/21 74  12/26/20 (!) 58  12/19/20 81   Wt Readings from Last 3 Encounters:  01/01/21 267 lb 6.4 oz (121.3 kg)  12/19/20 267 lb (121.1 kg)  12/11/20 266 lb 3.2 oz (120.7 kg)    Assessment/Interventions: Review of patient past medical history, allergies, medications, health status, including review of consultants reports, laboratory and other test data, was performed as part of comprehensive evaluation and provision of chronic care management services.   SDOH:  (Social Determinants of Health) assessments and interventions performed: {yes/no:20286}   CCM Care Plan  Allergies  Allergen Reactions  . Aspirin Swelling  . Hydrocodone Itching    Medications Reviewed Today    Reviewed by Haydee Salter, MD (Physician) on 01/01/21 at [place]  Med List Status: <None>  Medication Order Taking? Sig Documenting Provider Last Dose Status Informant  albuterol (VENTOLIN HFA) 108 (90 Base) MCG/ACT inhaler [id number] Yes Inhale 1-2 puffs into the lungs every 6 (six) hours as needed for wheezing or shortness of breath. Brunetta Jeans, PA-C Taking Active   allopurinol (ZYLOPRIM) 100 MG tablet [id number] Yes Take 1 tablet (100 mg total) by mouth daily. Brunetta Jeans, PA-C Taking Active   ALPRAZolam Duanne Moron) 0.25 MG tablet [id number] Yes Take 1 tablet (0.25 mg total) by mouth 2 (two) times daily as needed for anxiety. Brunetta Jeans, PA-C Taking Active            Med Note Catha Gosselin Dec 19, 2020  9:17 AM) Last dose: 12/18/2020        Discontinued 01/01/21 0845 (Change in therapy)   cyclobenzaprine (FLEXERIL) 10 MG tablet [id number] Yes Take 1 tablet (10 mg total) by mouth at bedtime.   Patient taking differently: Take 10 mg by mouth at bedtime as needed for muscle spasms.   Brunetta Jeans, PA-C Taking Active   furosemide (LASIX) 20 MG tablet [id number] Yes Take by mouth. [provider] Taking Active   gabapentin (NEURONTIN) 300 MG capsule [id number] Yes Take 1 capsule each morning and midday along with your 600 mg dose to make a total of 900 mg  Patient taking differently: Take 300 mg by mouth daily. Taking daily at noon along with 622m = 9046m  MaBrunetta JeansPA-C Taking Active Self  gabapentin (NEURONTIN) 600 MG tablet [id number] TAKE 1 TABLET(600 MG) BY MOUTH THREE TIMES DAILY RuHaydee SalterMD Taking Active   ipratropium-albuterol (DUONEB) 0.5-2.5 (3) MG/3ML SOLN [id number] Take 3 mLs by nebulization every 8 (  eight) hours as needed (shortness of breath, wheezing). Brunetta Jeans, PA-C Taking Active   lisinopril (ZESTRIL) 20 MG tablet [id number] Yes Take 1 tablet (20 mg total) by mouth daily. Brunetta Jeans, PA-C Taking Active   montelukast (SINGULAIR) 10 MG tablet [id number] Yes Take 1 tablet (10 mg total) by mouth daily. Brunetta Jeans, PA-C Taking Active   nortriptyline (PAMELOR) 25 MG capsule [id number] Yes Take 1 capsule (25 mg total) by mouth at bedtime. Izora Ribas, MD Taking Active   Oxycodone HCl 10 MG TABS [id number] Yes Take 1 tablet (10 mg total) by mouth every 6 (six) hours as needed. Izora Ribas, MD Taking Active   oxyCODONE-acetaminophen (PERCOCET) 10-325 MG tablet [id number] Yes Take 1 tablet by mouth every 6 (six) hours as needed for pain. Izora Ribas, MD Taking Active            Med Note Catha Gosselin Dec 19, 2020  9:17 AM) Patient did not bring container, last dose: 12/18/2020  potassium chloride (KLOR-CON) 10 MEQ tablet [id number] Yes Take by mouth. [provider] Taking Active   sertraline (ZOLOFT) 50 MG tablet [id number] Yes Take 2 tablets (100 mg total) by mouth daily. Haydee Salter, MD Taking Active   vitamin B-12 (CYANOCOBALAMIN) 500 MCG tablet [id number] Yes Take by mouth. [provider] Taking Active   Vitamin D, Ergocalciferol, (DRISDOL) 1.25 MG (50000 UNIT) CAPS capsule [id number] Yes Take 1 capsule (50,000 Units total) by mouth every 7 (seven) days. Midge Minium, MD Taking Active           Patient Active Problem List   Diagnosis Date Noted  . Alcohol abuse 12/28/2020  . Cardiomyopathy ([place]) 12/28/2020  . Elevated lactic acid level 12/24/2020  . Asthma 12/12/2020  . Hyperlipidemia 12/12/2020  . Type 2 diabetes mellitus ([place]) 12/12/2020  . Gout 04/16/2020  . Ankle fracture, bimalleolar, closed, left, initial encounter 02/15/2020  . Morbid obesity ([place]) 12/27/2019  . Essential hypertension 11/18/2019  . Anxiety and depression 03/03/2017  . Cervical disc disorder with radiculopathy of cervical region 07/24/2015  . 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 Chronic back pain greater than 3 months duration 01/23/2015  . 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 Amputation stump complication ([place]) 28/31/5176    Immunization History  Administered Date(s) Administered  . Influenza Inj Mdck Quad With Preservative 09/19/2017  . Influenza Split 09/10/2015  . Influenza,inj,Quad PF,6+ Mos 08/11/2016, 08/06/2017, 06/08/2018, 08/31/2019, 11/29/2020  . Influenza-Unspecified 09/23/2017  . Tdap 01/31/2015    Conditions to be addressed/monitored:  Hypertension, Hyperlipidemia, Diabetes, Asthma, Depression, Anxiety, Gout and Chronic Pain  There are no care plans that you recently modified to display for this patient.    Medication Assistance: {MEDASSISTANCEINFO:25044}  Patient's preferred pharmacy is:  [facility] [address], [address] [place] [address] Phone: [phone]  Fax: [phone]  [facility] [address], [address] [place] [address] Phone: [phone] Fax: [phone]  Uses pill box? {Yes or If no, why not?:20788} Pt endorses ***% compliance  We discussed: {Pharmacy options:24294} Patient decided to: {US Pharmacy Plan:23885}  Care Plan and Follow Up Patient Decision:  {FOLLOWUP:24991}  Plan: {CM FOLLOW UP PLAN:25073}  ***  Current Barriers:  . {pharmacybarriers:24917} . ***  Pharmacist Clinical Goal(s):  Marland Kitchen Over the next *** days, patient will {PHARMACYGOALCHOICES:24921} through collaboration with PharmD and provider.  . ***  Interventions: . 1:1 collaboration with Haydee Salter, MD regarding development and update of comprehensive plan of care as evidenced by provider attestation and co-signature . Inter-disciplinary care team collaboration (see longitudinal plan of care) . Comprehensive medication review performed; medication list updated in electronic medical record  Hypertension (BP goal {CHL HP UPSTREAM Pharmacist BP [phone]}) -{US controlled/uncontrolled:25276} -Current treatment: . Lisinopril 20 mg daily  -Medications previously tried: Furosemide  -Current home readings: *** -Current dietary habits: *** -Current exercise habits: *** -{ACTIONS;DENIES/REPORTS:21021675::"Denies"} hypotensive/hypertensive symptoms -Educated on {CCM BP Counseling:25124} -Counseled to monitor BP at home ***, document, and provide log at future appointments -{CCMPHARMDINTERVENTION:25122}  Hyperlipidemia: (LDL goal < ***) -{US controlled/uncontrolled:25276} -Current treatment: . Atorvastatin 20 mg daily  -Medications previously tried: ***  -Current dietary patterns: *** -Current exercise habits: *** -Educated on {CCM HLD Counseling:25126} -{CCMPHARMDINTERVENTION:25122}  Diabetes (A1c goal {A1c goals:23924}) -{US controlled/uncontrolled:25276} -Current  medications: . Jardiance 10 mg daily  -Medications previously tried: ***  -Current home glucose readings . fasting glucose: *** . post prandial glucose: *** -{ACTIONS;DENIES/REPORTS:21021675::"Denies"} hypoglycemic/hyperglycemic symptoms -Current meal patterns:  . breakfast: ***  . lunch: ***  . dinner: *** . snacks: *** . drinks: *** -Current exercise: *** -Educated on {CCM DM COUNSELING:25123} -Counseled to check feet daily and get yearly eye exams -{CCMPHARMDINTERVENTION:25122}  Asthma (Goal: control symptoms and prevent exacerbations) -{US controlled/uncontrolled:25276} -Current treatment  . Ventolin HFA 1-2 puffs every 6 hours as needed . DuoNeb 33m every 8 hours as needed  . Montelukast 10 mg nightly  -Medications previously tried: ***  -Pulmonary function testing: *** -Exacerbations requiring treatment in last 6 months: *** -Patient {Actions; denies-reports:120008} consistent use of maintenance inhaler -Frequency of rescue inhaler use: *** -Counseled on {CCMINHALERCOUNSELING:25121} -{CCMPHARMDINTERVENTION:25122}  Depression/Anxiety (Goal: ***) -{US controlled/uncontrolled:25276} -Current treatment: . Alprazolam 0.25 mg daily  . Nortriptyline 25 mg nightly  . Sertraline 50 mg two tablets daily  -Medications previously tried/failed: *** -PHQ9: *** -GAD7: *** -Connected with *** for mental health support -Educated on {CCM mental health counseling:25127} -{CCMPHARMDINTERVENTION:25122}  Gout (Goal: Prevent gout flares) -{US controlled/uncontrolled:25276} -Current treatment  . Allopurinol 100 mg daily  -Medications previously tried: ***  -{CCMPHARMDINTERVENTION:25122}  Chronic Pain (Goal: ***) -Managed by Dr. RRanell Patrick -{US controlled/uncontrolled:25276} -Current treatment  . Cyclobenzaprine 10 mg nightly as needed  . Gabapentin 300 mg at breakfast and at noon . Gabapentin 600 mg three times daily  . Oxycodone 10 mg every 6 hours as needed  . Oxycodone-APAP  10-325 mg every 6 hours as needed  . Nortriptyline 25 mg nightly  -Medications previously tried: ***  -{CCMPHARMDINTERVENTION:25122}    Patient Goals/Self-Care Activities . Over the next *** days, patient will:  - {pharmacypatientgoals:24919}  Follow Up Plan: {CM FOLLOW UP PNZVJ:28206}

## 2021-01-11 ENCOUNTER — Telehealth: Payer: Self-pay

## 2021-01-11 NOTE — Progress Notes (Signed)
    Chronic Care Management Pharmacy Assistant   Name: Daniel Durham  MRN: [id number] DOB: August 15, 1966  Reason for Encounter: Medication Review/initial question for initial visit with clinical pharmacist on 01/14/2021    Hospital visits:  Medication Reconciliation was completed by comparing discharge summary, patient's EMR and Pharmacy list, and upon discussion with patient.  Seen at the hospital on 12/26/2020 due to Vertigo. Discharge date was 12/26/2020. Discharged from [facility]?Medications Started at [facility] Discharge:?? None ID  Medication Changes at Hospital Discharge: -None ID   Medications Discontinued at Hospital Discharge: None ID   Medications that remain the same after Hospital Discharge:??  -All other medications will remain the same.    .Left Voice message to do initial question prior to patient appointment on 01/14/2021  for CCM at 10:00am  with Junius Argyle the Clinical pharmacist.     Medications: Outpatient Encounter Medications as of 01/11/2021  Medication Sig Note  . albuterol (VENTOLIN HFA) 108 (90 Base) MCG/ACT inhaler Inhale 1-2 puffs into the lungs every 6 (six) hours as needed for wheezing or shortness of breath.   . allopurinol (ZYLOPRIM) 100 MG tablet Take 1 tablet (100 mg total) by mouth daily.   Marland Kitchen ALPRAZolam (XANAX) 0.25 MG tablet Take 1 tablet (0.25 mg total) by mouth daily.   Marland Kitchen atorvastatin (LIPITOR) 20 MG tablet Take 1 tablet (20 mg total) by mouth daily.   . cyclobenzaprine (FLEXERIL) 10 MG tablet Take 1 tablet (10 mg total) by mouth at bedtime. (Patient taking differently: Take 10 mg by mouth at bedtime as needed for muscle spasms.)   . empagliflozin (JARDIANCE) 10 MG TABS tablet Take 1 tablet (10 mg total) by mouth daily before breakfast.   . gabapentin (NEURONTIN) 300 MG capsule Take 1 capsule each morning and midday along with your 600 mg dose to make a total of 900 mg (Patient taking differently: Take 300 mg by  mouth daily. Taking daily at noon along with 600mg  = 900mg )   . gabapentin (NEURONTIN) 600 MG tablet TAKE 1 TABLET(600 MG) BY MOUTH THREE TIMES DAILY   . ipratropium-albuterol (DUONEB) 0.5-2.5 (3) MG/3ML SOLN Take 3 mLs by nebulization every 8 (eight) hours as needed (shortness of breath, wheezing).   Marland Kitchen lisinopril (ZESTRIL) 20 MG tablet Take 1 tablet (20 mg total) by mouth daily.   . montelukast (SINGULAIR) 10 MG tablet Take 1 tablet (10 mg total) by mouth daily.   . nortriptyline (PAMELOR) 25 MG capsule Take 1 capsule (25 mg total) by mouth at bedtime.   . Oxycodone HCl 10 MG TABS Take 1 tablet (10 mg total) by mouth every 6 (six) hours as needed.   Marland Kitchen oxyCODONE-acetaminophen (PERCOCET) 10-325 MG tablet Take 1 tablet by mouth every 6 (six) hours as needed for pain. 12/19/2020: Patient did not bring container, last dose: 12/18/2020  . potassium chloride (KLOR-CON) 10 MEQ tablet Take by mouth.   . sertraline (ZOLOFT) 50 MG tablet Take 2 tablets (100 mg total) by mouth daily.   . vitamin B-12 (CYANOCOBALAMIN) 500 MCG tablet Take by mouth.   . Vitamin D, Ergocalciferol, (DRISDOL) 1.25 MG (50000 UNIT) CAPS capsule Take 1 capsule (50,000 Units total) by mouth every 7 (seven) days.    No facility-administered encounter medications on file as of 01/11/2021.    Star Rating Drugs:atorvastatin,lisinopril  Web designer [phone]

## 2021-01-14 ENCOUNTER — Encounter: Payer: Self-pay | Admitting: Family Medicine

## 2021-01-14 ENCOUNTER — Telehealth: Payer: Self-pay | Admitting: Family Medicine

## 2021-01-14 ENCOUNTER — Ambulatory Visit: Payer: Medicare HMO

## 2021-01-14 ENCOUNTER — Other Ambulatory Visit: Payer: Self-pay

## 2021-01-14 NOTE — Progress Notes (Signed)
  Chronic Care Management   Note  01/14/2021 Name: Daniel Durham MRN: [id number] DOB: 03/11/1966  Daniel Durham is a 55 y.o. year old male who is a primary care patient of Gena Fray, Lillette Boxer, MD. I reached out to [employer] by phone today in response to a referral sent by Daniel Durham's PCP, Gena Fray, Lillette Boxer, MD.   Daniel Durham was given information about Chronic Care Management services today including:  1. CCM service includes personalized support from designated clinical staff supervised by his physician, including individualized plan of care and coordination with other care providers 2. 24/7 contact phone numbers for assistance for urgent and routine care needs. 3. Service will only be billed when office clinical staff spend 20 minutes or more in a month to coordinate care. 4. Only one practitioner may furnish and bill the service in a calendar month. 5. The patient may stop CCM services at any time (effective at the end of the month) by phone call to the office staff.   Patient agreed to services and verbal consent obtained.   Follow up plan:   Carley Perdue [employer] Scheduler

## 2021-01-14 NOTE — Telephone Encounter (Signed)
Per Dr Rudd's recommendation he wanted him make an appt in 4 weeks for f/u Dm.   Can you call him to schedule or would you like me to do it? Dm/cma

## 2021-01-14 NOTE — Telephone Encounter (Signed)
Tried to call to schedule, Had to lvm

## 2021-01-14 NOTE — Telephone Encounter (Signed)
Pt called and said that he was supposed to come in 4 weeks from hi last visit which would be next week for lab work but there werent any labs in, Is this okay to schedule? Does he need an appt or just lab appt?

## 2021-01-22 ENCOUNTER — Ambulatory Visit: Payer: Medicare HMO | Admitting: Dietician

## 2021-01-23 ENCOUNTER — Encounter: Payer: Self-pay | Admitting: Family Medicine

## 2021-01-23 ENCOUNTER — Ambulatory Visit (INDEPENDENT_AMBULATORY_CARE_PROVIDER_SITE_OTHER): Payer: Medicare HMO | Admitting: Family Medicine

## 2021-01-23 ENCOUNTER — Other Ambulatory Visit: Payer: Medicare HMO

## 2021-01-23 ENCOUNTER — Other Ambulatory Visit: Payer: Self-pay

## 2021-01-23 VITALS — BP 124/80 | HR 76 | Temp 97.8°F | Ht 67.0 in | Wt 268.8 lb

## 2021-01-23 DIAGNOSIS — E119 Type 2 diabetes mellitus without complications: Secondary | ICD-10-CM

## 2021-01-23 DIAGNOSIS — B353 Tinea pedis: Secondary | ICD-10-CM

## 2021-01-23 DIAGNOSIS — M542 Cervicalgia: Secondary | ICD-10-CM | POA: Diagnosis not present

## 2021-01-23 DIAGNOSIS — G8929 Other chronic pain: Secondary | ICD-10-CM | POA: Diagnosis not present

## 2021-01-23 DIAGNOSIS — F419 Anxiety disorder, unspecified: Secondary | ICD-10-CM | POA: Diagnosis not present

## 2021-01-23 DIAGNOSIS — M545 Low back pain, unspecified: Secondary | ICD-10-CM | POA: Diagnosis not present

## 2021-01-23 DIAGNOSIS — Z79899 Other long term (current) drug therapy: Secondary | ICD-10-CM | POA: Diagnosis not present

## 2021-01-23 DIAGNOSIS — B351 Tinea unguium: Secondary | ICD-10-CM | POA: Diagnosis not present

## 2021-01-23 DIAGNOSIS — R7989 Other specified abnormal findings of blood chemistry: Secondary | ICD-10-CM

## 2021-01-23 DIAGNOSIS — F32A Depression, unspecified: Secondary | ICD-10-CM | POA: Diagnosis not present

## 2021-01-23 DIAGNOSIS — M549 Dorsalgia, unspecified: Secondary | ICD-10-CM | POA: Diagnosis not present

## 2021-01-23 DIAGNOSIS — I10 Essential (primary) hypertension: Secondary | ICD-10-CM

## 2021-01-23 LAB — MICROALBUMIN / CREATININE URINE RATIO
Creatinine,U: 111.9 mg/dL
Microalb Creat Ratio: 1.2 mg/g (ref 0.0–30.0)
Microalb, Ur: 1.4 mg/dL (ref 0.0–1.9)

## 2021-01-23 LAB — URINALYSIS, ROUTINE W REFLEX MICROSCOPIC
Bilirubin Urine: NEGATIVE
Hgb urine dipstick: NEGATIVE
Ketones, ur: NEGATIVE
Leukocytes,Ua: NEGATIVE
Nitrite: NEGATIVE
RBC / HPF: NONE SEEN (ref 0–?)
Specific Gravity, Urine: 1.025 (ref 1.000–1.030)
Total Protein, Urine: NEGATIVE
Urine Glucose: NEGATIVE
Urobilinogen, UA: 0.2 (ref 0.0–1.0)
pH: 5.5 (ref 5.0–8.0)

## 2021-01-23 LAB — LIPID PANEL
Cholesterol: 200 mg/dL (ref 0–200)
HDL: 40.1 mg/dL (ref >39.00)
LDL Cholesterol: 130 mg/dL — ABNORMAL HIGH (ref 0–99)
NonHDL: 159.9
Total CHOL/HDL Ratio: 5
Triglycerides: 148 mg/dL (ref 0.0–149.0)
VLDL: 29.6 mg/dL (ref 0.0–40.0)

## 2021-01-23 LAB — BASIC METABOLIC PANEL
BUN: 9 mg/dL (ref 6–23)
CO2: 26 mEq/L (ref 19–32)
Calcium: 9.4 mg/dL (ref 8.4–10.5)
Chloride: 105 mEq/L (ref 96–112)
Creatinine, Ser: 0.91 mg/dL (ref 0.40–1.50)
GFR: 96.02 mL/min (ref >60.00)
Glucose, Bld: 124 mg/dL — ABNORMAL HIGH (ref 70–99)
Potassium: 4.4 mEq/L (ref 3.5–5.1)
Sodium: 140 mEq/L (ref 135–145)

## 2021-01-23 LAB — HEMOGLOBIN A1C: Hgb A1c MFr Bld: 5.9 % (ref 4.6–6.5)

## 2021-01-23 MED ORDER — CLOTRIMAZOLE 1 % EX CREA: 1.0000 "application " | TOPICAL_CREAM | Freq: Two times a day (BID) | CUTANEOUS | 0 refills | Status: AC

## 2021-01-23 NOTE — Progress Notes (Signed)
[facility] [address] [place] [address] Dept: [phone] Dept Fax: [phone]  Chronic Care Office Visit  Subjective:    Patient ID: Carolos Fecher, male    DOB: 10-11-1966, 55 y.o..   MRN: [id number]  Chief Complaint  Patient presents with  . Follow-up    2 week f/u.     History of Present Illness:  Patient is in today for reassessment of chronic medical issues. Since his last visit, Mr. Flight reports that his chronic pain specialist stopped prescribing him Percocet. This was in response to the ED visit related to alcohol intoxication. Although the specialist offered to continue to see him for non-opioid management of his pain issues, Mr. Weidner is choosing to seek another pain clinic to assist him. He has an appointment at the new clinic later today. He notes he has been on chronic opioids for 17 years. He describes have reduced his overall opioid use from previous, as he was on morphine 100 mg daily and Percocet 6 times a day. His most recent use has been Percocet 10/325 qid. In reviewing his med list, he also reports he is no longer using Flexeril or nortriptyline. He also had stopped taking the additional gabapentin 300 mg midday, so is now just on gabapentin 6000 mg tid. Mr. 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 He notes since being off the opioids, the pain has significantly flared.  Mr. Trethewey notes that he is now abstaining from all alcohol use. He continues to characterize the event in Feb. as a one-time issue. He admits he had used small amounts of beer prior, but notes liquor had been rare. Despite his view of this not having been a significant issue, he as made the commitment to his fiance to stop drinking.  Mr. Esterline is taking his Jardiance without issues. He notes that he did not get in  for diabetic teaching yet, but has rescheduled his intake. We discussed having started Jardiance as opposed to metformin due to his lactic acidosis.  Mr. Bayle has a history of anxiety and depression. We increased his Zoloft dose from 25mg  to 50 mg daily in early February. He notes that he has tolerated the dose change. However, he continues to note a high level of anxiety. He states on days that weather allows, he stays out of his house, due to increase anxiety related to multiple household members.  Past Medical History: Patient Active Problem List   Diagnosis Date Noted  . Alcohol abuse 12/28/2020  . Cardiomyopathy ([place]) 12/28/2020  . Elevated lactic acid level 12/24/2020  . Asthma 12/12/2020  . Hyperlipidemia 12/12/2020  . Type 2 diabetes mellitus ([place]) 12/12/2020  . Gout 04/16/2020  . Ankle fracture, bimalleolar, closed, left, initial encounter 02/15/2020  . Morbid obesity ([place]) 12/27/2019  . Essential hypertension 11/18/2019  . Anxiety and depression 03/03/2017  . Cervical disc disorder with radiculopathy of cervical region 07/24/2015  . 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 Chronic back pain greater than 3 months duration 01/23/2015  . 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 Amputation stump complication ([place]) 88/82/8003   Past Surgical History:  Procedure Laterality Date  . BACK SURGERY    . CARDIAC CATHETERIZATION     11/05/10 [facility], [place]): Briding of mLAD, no sign  disease. EF 45% in RAO, 60% LAO (51% stress, 54% rest by NM stress; 57-59% echo 10/2010)  . CARPAL TUNNEL RELEASE     Bilateral  . CERVICAL DISCECTOMY  12/17/2017   titanium plates 12-17-17  . COLONOSCOPY    . HAND AMPUTATION  2007  . ORIF ANKLE FRACTURE Left 02/23/2020   Procedure: OPEN REDUCTION INTERNAL FIXATION (ORIF) ANKLE FRACTURE;  Surgeon: Nicholes Stairs, MD;  Location:  [facility];  Service: Orthopedics;  Laterality: Left;  . POLYPECTOMY     pt states had colon in [place] and he had polyps- he has no idea what type   . ROTATOR CUFF REPAIR     Left  . SPINAL CORD STIMULATOR IMPLANT    . SPINAL CORD STIMULATOR REMOVAL N/A 10/26/2015   Procedure: LUMBAR SPINAL CORD STIMULATOR REMOVAL;  Surgeon: Clydell Hakim, MD;  Location: [facility];  Service: Neurosurgery;  Laterality: N/A;   Family History  Problem Relation Age of Onset  . Diabetes Mother        Living  . Sleep apnea Mother   . Diabetes Father 67       Deceased  . Hypertension Father   . Jaundice Father   . Hemophilia Father   . Alcoholism Father   . Cancer Maternal Grandfather        Throat  . Esophageal cancer Maternal Grandfather   . Cancer Paternal Uncle        Throat  . Esophageal cancer Paternal Uncle   . Asthma Sister   . Diabetes Sister   . Healthy Son        X1  . Healthy Daughter        X1  . Colon polyps Neg Hx   . Colon cancer Neg Hx   . Rectal cancer Neg Hx   . Stomach cancer Neg Hx    Outpatient Medications Prior to Visit  Medication Sig Dispense Refill  . albuterol (VENTOLIN HFA) 108 (90 Base) MCG/ACT inhaler Inhale 1-2 puffs into the lungs every 6 (six) hours as needed for wheezing or shortness of breath. 18 g 2  . allopurinol (ZYLOPRIM) 100 MG tablet Take 1 tablet (100 mg total) by mouth daily. 90 tablet 1  . ALPRAZolam (XANAX) 0.25 MG tablet Take 1 tablet (0.25 mg total) by mouth daily. 30 tablet 0  . atorvastatin (LIPITOR) 20 MG tablet Take 1 tablet (20 mg total) by mouth daily. 90 tablet 3  . empagliflozin (JARDIANCE) 10 MG TABS tablet Take 1 tablet (10 mg total) by mouth daily before breakfast. 30 tablet 2  . gabapentin (NEURONTIN) 600 MG tablet TAKE 1 TABLET(600 MG) BY MOUTH THREE TIMES DAILY 270 tablet 0  . ipratropium-albuterol (DUONEB) 0.5-2.5 (3) MG/3ML SOLN Take 3 mLs by nebulization every 8 (eight) hours as needed (shortness of breath, wheezing). 3 mL 11  .  lisinopril (ZESTRIL) 20 MG tablet Take 1 tablet (20 mg total) by mouth daily. 90 tablet 1  . montelukast (SINGULAIR) 10 MG tablet Take 1 tablet (10 mg total) by mouth daily. 90 tablet 1  . oxyCODONE-acetaminophen (PERCOCET) 10-325 MG tablet Take 1 tablet by mouth every 6 (six) hours as needed for pain. 120 tablet 0  . sertraline (ZOLOFT) 50 MG tablet Take 2 tablets (100 mg total) by mouth daily. 180 tablet 0  . vitamin B-12 (CYANOCOBALAMIN) 500 MCG tablet Take by mouth.    . Vitamin D, Ergocalciferol, (DRISDOL) 1.25 MG (50000 UNIT) CAPS capsule Take 1 capsule (50,000 Units total) by mouth  every 7 (seven) days. 5 capsule 3  . cyclobenzaprine (FLEXERIL) 10 MG tablet Take 1 tablet (10 mg total) by mouth at bedtime. (Patient taking differently: Take 10 mg by mouth at bedtime as needed for muscle spasms.) 30 tablet 0  . gabapentin (NEURONTIN) 300 MG capsule Take 1 capsule each morning and midday along with your 600 mg dose to make a total of 900 mg (Patient taking differently: Take 300 mg by mouth daily. Taking daily at noon along with 600mg  = 900mg ) 60 capsule 3  . nortriptyline (PAMELOR) 25 MG capsule Take 1 capsule (25 mg total) by mouth at bedtime. 30 capsule 1  . potassium chloride (KLOR-CON) 10 MEQ tablet Take by mouth.    . Oxycodone HCl 10 MG TABS Take 1 tablet (10 mg total) by mouth every 6 (six) hours as needed. (Patient not taking: Reported on 01/23/2021) 120 tablet 0   No facility-administered medications prior to visit.   Allergies  Allergen Reactions  . Aspirin Swelling  . Hydrocodone Itching    Objective:   Today's Vitals   01/23/21 0916  BP: 124/80  Pulse: 76  Temp: 97.8 F (36.6 C)  TempSrc: Temporal  SpO2: 95%  Weight: 268 lb 12.8 oz (121.9 kg)  Height: 5\' 7"  (1.702 m)   Body mass index is 42.1 kg/m.   General: Well developed, well nourished. No acute distress. Feet: There is maceration between most toes. There is some thickening of skin on the soles of the feet, esp.    around the heels. Many of the toe nails are thickened and yellow with some nail deformity. DP and PT pulses are   2+ bilaterally. 5.07 monofilament testing is normal. Psych: Alert and oriented. Pressured speech and mild psychomotor agitation. Normal mood and affect.  Health Maintenance Due  Topic Date Due  . PNEUMOCOCCAL POLYSACCHARIDE VACCINE AGE 67-64 HIGH RISK  Never done  . FOOT EXAM  Never done  . OPHTHALMOLOGY EXAM  Never done  . COVID-19 Vaccine (1) Never done  . COLONOSCOPY (Pts 45-47yrs Insurance coverage will need to be confirmed)  07/29/2019     Assessment & Plan:   1. Type 2 diabetes mellitus without complication, without long-term current use of insulin (HCC) We will check initial baseline labs related to recent diagnosis of Type 2 diabetes. We did discuss the importance of him following through with diabetic teaching. He has a follow-up scheduled with me for a physical exam in April.  - Lipid panel - Microalbumin / creatinine urine ratio - Basic metabolic panel - Hemoglobin A1c - Urinalysis, Routine w reflex microscopic  2. Essential hypertension Blood pressure is at goal. Mr. Junious had asked about whether he might stop his lisinopril. I recommended against this. Not only is his blood pressure now at goal, but ACE-Is have renal protective effects for patients with diabetes.  3. Chronic back pain greater than 3 months duration Mr. Butch is seeking a new pain clinic today. It appears that he has made reductions in his opioid use over time and has not demonstrated opioid addiction behaviors. I remain concerned about the potential risk with opioid use and regular use of a benzodiazepine, so having a pain specialist involved will be most appropriate.  4. Elevated lactic acid level This was still slightly elevated at last check. I would like to assure this has resolved.  - Lactic acid, plasma  5. Tinea pedis of both feet Findings on his foot exam today show  wide-spread tinea pedis. I will prescribe  cream for this.  - clotrimazole (CLOTRIMAZOLE ANTI-FUNGAL) 1 % cream; Apply 1 application topically 2 (two) times daily.  Dispense: 15 each; Refill: 0  6. Onychomycosis There has been past elevated LFTs. However, we may consider a course of Lamisil int he futre to try and clear the nails, due to diabetic foot infection risks.  7. Anxiety and depression Mr. Minor is tolerating the dosage increase in the Zoloft to 50 mg daily. I suspect that he will need a higher dose to show a positive impact on his anxiety, though currently there could be considerable overlap with his opioid withdrawal. I will look to increase this to 100 mg daily at his next visit.  Haydee Salter, MD

## 2021-01-25 ENCOUNTER — Encounter: Payer: Medicare HMO | Admitting: Physical Medicine and Rehabilitation

## 2021-01-25 LAB — LACTIC ACID, PLASMA: LACTIC ACID: 2.1 mmol/L — ABNORMAL HIGH (ref 0.4–1.8)

## 2021-01-31 ENCOUNTER — Telehealth: Payer: Self-pay | Admitting: Family Medicine

## 2021-01-31 NOTE — Progress Notes (Signed)
  Chronic Care Management   Note  01/31/2021 Name: Daniel Durham MRN: [id number] DOB: 1966-04-16  Daniel Durham is a 55 y.o. year old male who is a primary care patient of Gena Fray, Lillette Boxer, MD. I reached out to [employer] by phone today in response to a referral sent by [name]'s PCP, Gena Fray, Lillette Boxer, MD.   [name] was given information about Chronic Care Management services today including:  1. CCM service includes personalized support from designated clinical staff supervised by his physician, including individualized plan of care and coordination with other care providers 2. 24/7 contact phone numbers for assistance for urgent and routine care needs. 3. Service will only be billed when office clinical staff spend 20 minutes or more in a month to coordinate care. 4. Only one practitioner may furnish and bill the service in a calendar month. 5. The patient may stop CCM services at any time (effective at the end of the month) by phone call to the office staff.   Patient wishes to consider information provided and/or speak with a member of the care team before deciding about enrollment in care management services.   Follow up plan:   Carley Perdue [employer] Scheduler

## 2021-02-05 ENCOUNTER — Ambulatory Visit: Payer: Medicare HMO

## 2021-02-06 ENCOUNTER — Other Ambulatory Visit: Payer: Self-pay

## 2021-02-06 ENCOUNTER — Encounter: Payer: Self-pay | Admitting: Internal Medicine

## 2021-02-06 ENCOUNTER — Ambulatory Visit: Payer: Medicare HMO | Admitting: Internal Medicine

## 2021-02-06 VITALS — BP 136/78 | HR 88 | Temp 98.4°F | Ht 67.0 in | Wt 266.0 lb

## 2021-02-06 DIAGNOSIS — R0600 Dyspnea, unspecified: Secondary | ICD-10-CM | POA: Diagnosis not present

## 2021-02-06 DIAGNOSIS — U071 COVID-19: Secondary | ICD-10-CM | POA: Diagnosis not present

## 2021-02-06 DIAGNOSIS — R0609 Other forms of dyspnea: Secondary | ICD-10-CM

## 2021-02-06 DIAGNOSIS — G4733 Obstructive sleep apnea (adult) (pediatric): Secondary | ICD-10-CM

## 2021-02-06 NOTE — Progress Notes (Signed)
Daniel Durham    [id number]    Jan 05, 1966  Primary Care Physician:Rudd, Lillette Boxer, MD Date of Appointment: 02/06/2021 Established Patient Visit  Chief complaint:   Chief Complaint  Patient presents with  . Shortness of Breath    Reports gradual improvement but dyspnea with activity continues since diagnosed with COVID. Reports last used albuterol one week ago.     HPI: Daniel Durham is a 55 y.o. gentleman with COVId 19 infection Jan 2021. Was admitted for prolonged hospital stay.  Was on prednisone taper.   Interval Updates: Here for follow up today. Had Covid again in Jan 2022. Has gained a lot of weight in the past year due to being immobile. He also broke his ankle and is now ambulating with a cane. Trying to lose weight. Has stopped drinking beer. Gets short of breath with minimal exertion such as working in the yard. He is taking albuterol inhaler not as often as he probably should. No cough, chest tightness, wheezing - although he had this before closer to his illness.   He feels that his hands are feeling stiff and filling up with fluid - has a hard time making a fist. Denies LE edema. He has gout on his left toe.   He does take a nebulizer treatment once a day due seasonal allergies causing dyspnea.   Never smoker.  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   OBSTRUCTIVE SLEEP APNEA SCREENING  1.  Snoring?:  yes 2.  Tired?:  yes 3.  Observed apnea, stop breathing or choking/gasping during sleep?:  now 4.  Pressure. HTN history?  yes 5.  BMI more than 35 kg/m2?  yes 6.  Age more than 87 yrs?  yes 7.  Neck size larger than 17 in for male or 16 in for male?  yes 8.  Gender = Male?  yes  Total:  7  For general population  OSA - Low Risk : Yes to 0 - 2 questions OSA - Intermediate Risk : Yes to 3 - 4 questions OSA - High Risk : Yes to 5 - 8 questions  or Yes to 2 or more of 4 STOP  questions + male gender or Yes to 2 or more of 4 STOP questions + BMI > 35kg/m2  or Yes to 2 or more of 4 STOP questions + neck circumference 17 inches / 43cm in male or 16 inches / 41cm in male  References: Rinaldo Cloud al. Anesthesiology 2008; 108: 812-821,  Gabriel Cirri et al Br Dara Hoyer 2012; 108: 250-539,  Gabriel Cirri et al J Clin Sleep Med Sept 2014.   I have reviewed the patient's family social and past medical history and updated as appropriate.   Past Medical History:  Diagnosis Date  . Allergy   . Anxiety   . Asthma    SEASONAL   . Blood transfusion without reported diagnosis   . Chronic back pain    Neurostimulator  . Depression   . Environmental allergies   . Hypertension   . Neuromuscular disorder ([place])   . Neuropathy   . Seasonal allergic conjunctivitis     Past Surgical History:  Procedure Laterality Date  . BACK SURGERY    . CARDIAC CATHETERIZATION     11/05/10 [facility], [place]): Briding of mLAD, no sign disease. EF 45% in RAO, 60% LAO (51% stress, 54% rest by NM  stress; 57-59% echo 10/2010)  . CARPAL TUNNEL RELEASE     Bilateral  . CERVICAL DISCECTOMY  12/17/2017   titanium plates 12-17-17  . COLONOSCOPY    . HAND AMPUTATION  2007  . ORIF ANKLE FRACTURE Left 02/23/2020   Procedure: OPEN REDUCTION INTERNAL FIXATION (ORIF) ANKLE FRACTURE;  Surgeon: Nicholes Stairs, MD;  Location: [facility];  Service: Orthopedics;  Laterality: Left;  . POLYPECTOMY     pt states had colon in [place] and he had polyps- he has no idea what type   . ROTATOR CUFF REPAIR     Left  . SPINAL CORD STIMULATOR IMPLANT    . SPINAL CORD STIMULATOR REMOVAL N/A 10/26/2015   Procedure: LUMBAR SPINAL CORD STIMULATOR REMOVAL;  Surgeon: Clydell Hakim, MD;  Location: [facility];  Service: Neurosurgery;  Laterality: N/A;    Family History  Problem Relation Age of Onset  . Diabetes Mother        Living  . Sleep apnea Mother   . Diabetes Father 21       Deceased  . Hypertension  Father   . Jaundice Father   . Hemophilia Father   . Alcoholism Father   . Cancer Maternal Grandfather        Throat  . Esophageal cancer Maternal Grandfather   . Cancer Paternal Uncle        Throat  . Esophageal cancer Paternal Uncle   . Asthma Sister   . Diabetes Sister   . Healthy Son        X1  . Healthy Daughter        X1  . Colon polyps Neg Hx   . Colon cancer Neg Hx   . Rectal cancer Neg Hx   . Stomach cancer Neg Hx     Social History   Occupational History  . Not on file  Tobacco Use  . Smoking status: Never Smoker  . Smokeless tobacco: Never Used  Vaping Use  . Vaping Use: Never used  Substance and Sexual Activity  . Alcohol use: Not Currently    Alcohol/week: 0.0 standard drinks    Comment:    . Drug use: No  . Sexual activity: Yes     Physical Exam: Blood pressure 136/78, pulse 88, temperature 98.4 F (36.9 C), height 5\' 7"  (1.702 m), weight 266 lb (120.7 kg), SpO2 96 %.  Gen:      No acute distress ENT:  no nasal polyps, mucus membranes moist, mallampati 2 Lungs:    No increased respiratory effort, symmetric chest wall excursion, clear to auscultation bilaterally, no wheezes or crackles CV:         Regular rate and rhythm; no murmurs, rubs, or gallops.  No pedal edema Ext:        Left hand amputation  Data Reviewed: Imaging: I have personally reviewed the CT Chest Angio Jan 2021 compared to October 2021 shows improved bilateral multifocal opacities.   PFTs: None on file  Labs: Lab Results  Component Value Date   WBC 7.2 12/26/2020   HGB 14.3 12/26/2020   HCT 42.0 12/26/2020   MCV 99.1 12/26/2020   PLT 186 12/26/2020   Lab Results  Component Value Date   NA 140 01/23/2021   K 4.4 01/23/2021   CL 105 01/23/2021   CO2 26 01/23/2021    Immunization status: Immunization History  Administered Date(s) Administered  . Influenza Inj Mdck Quad With Preservative 09/19/2017  . Influenza Split 09/10/2015  . Influenza,inj,Quad PF,6+  Mos  08/11/2016, 08/06/2017, 06/08/2018, 08/31/2019, 11/29/2020  . Influenza-Unspecified 09/23/2017  . Tdap 01/31/2015    Assessment:  Dyspnea on Exertion OSA - high concern Covid 19 infection   Plan/Recommendations:  Will proceed with PFTs to evaluate nature of his dyspnea. No evidence of fibrosis on subsequent CT scan. radiographically improved. He is concerned about his weight gain over the last year contributing.  High concern for OSA - will proceed with home sleep apnea test.   Continue albuterol as needed   Return to Care: Return in about 4 weeks (around 03/06/2021).   Lenice Llamas, MD Pulmonary and [place]

## 2021-02-06 NOTE — Patient Instructions (Signed)
The patient should have follow up scheduled with myself in 4 weeks  Prior to next visit patient should have:  Full set of PFTs - one hour, next available with appt after this.  Home sleep apnea test.

## 2021-02-07 ENCOUNTER — Ambulatory Visit: Payer: Medicare HMO | Admitting: Physical Medicine & Rehabilitation

## 2021-02-12 ENCOUNTER — Other Ambulatory Visit: Payer: Self-pay | Admitting: Family Medicine

## 2021-02-12 DIAGNOSIS — F419 Anxiety disorder, unspecified: Secondary | ICD-10-CM

## 2021-02-12 DIAGNOSIS — F32A Depression, unspecified: Secondary | ICD-10-CM

## 2021-02-12 MED ORDER — ALPRAZOLAM 0.25 MG PO TABS: 0.2500 mg | ORAL_TABLET | Freq: Every day | ORAL | 0 refills | Status: DC

## 2021-02-12 NOTE — Telephone Encounter (Signed)
Last OV 01/23/21 w/lab work Last fill 01/01/21 #30/0

## 2021-02-20 ENCOUNTER — Ambulatory Visit: Payer: Medicare HMO | Admitting: Physical Medicine and Rehabilitation

## 2021-02-20 DIAGNOSIS — M545 Low back pain, unspecified: Secondary | ICD-10-CM | POA: Diagnosis not present

## 2021-02-20 DIAGNOSIS — Z79899 Other long term (current) drug therapy: Secondary | ICD-10-CM | POA: Diagnosis not present

## 2021-02-20 DIAGNOSIS — S58029A Partial traumatic amputation at elbow level, unspecified arm, initial encounter: Secondary | ICD-10-CM | POA: Diagnosis not present

## 2021-02-20 DIAGNOSIS — M542 Cervicalgia: Secondary | ICD-10-CM | POA: Diagnosis not present

## 2021-02-20 DIAGNOSIS — G8929 Other chronic pain: Secondary | ICD-10-CM | POA: Diagnosis not present

## 2021-02-21 DIAGNOSIS — Z0000 Encounter for general adult medical examination without abnormal findings: Secondary | ICD-10-CM | POA: Diagnosis not present

## 2021-02-21 DIAGNOSIS — M109 Gout, unspecified: Secondary | ICD-10-CM | POA: Diagnosis not present

## 2021-02-21 DIAGNOSIS — R5383 Other fatigue: Secondary | ICD-10-CM | POA: Diagnosis not present

## 2021-02-21 DIAGNOSIS — R0602 Shortness of breath: Secondary | ICD-10-CM | POA: Diagnosis not present

## 2021-02-21 DIAGNOSIS — E119 Type 2 diabetes mellitus without complications: Secondary | ICD-10-CM | POA: Diagnosis not present

## 2021-02-26 DIAGNOSIS — E119 Type 2 diabetes mellitus without complications: Secondary | ICD-10-CM | POA: Diagnosis not present

## 2021-02-28 ENCOUNTER — Telehealth: Payer: Self-pay | Admitting: Family Medicine

## 2021-02-28 NOTE — Telephone Encounter (Signed)
Spoke with patient he stated he was traveling and will call back in a week.

## 2021-03-11 ENCOUNTER — Other Ambulatory Visit: Payer: Self-pay

## 2021-03-11 ENCOUNTER — Ambulatory Visit: Payer: Medicare HMO

## 2021-03-11 ENCOUNTER — Other Ambulatory Visit (HOSPITAL_COMMUNITY)
Admission: RE | Admit: 2021-03-11 | Discharge: 2021-03-11 | Disposition: A | Discharge status: Home / Self Care | Payer: Medicare HMO | Source: Ambulatory Visit | Attending: Internal Medicine | Admitting: Internal Medicine

## 2021-03-11 DIAGNOSIS — Z20822 Contact with and (suspected) exposure to covid-19: Secondary | ICD-10-CM | POA: Insufficient documentation

## 2021-03-11 DIAGNOSIS — Z01812 Encounter for preprocedural laboratory examination: Secondary | ICD-10-CM | POA: Diagnosis not present

## 2021-03-11 DIAGNOSIS — G4733 Obstructive sleep apnea (adult) (pediatric): Secondary | ICD-10-CM

## 2021-03-12 ENCOUNTER — Other Ambulatory Visit: Payer: Self-pay | Admitting: Family Medicine

## 2021-03-12 DIAGNOSIS — G4733 Obstructive sleep apnea (adult) (pediatric): Secondary | ICD-10-CM | POA: Diagnosis not present

## 2021-03-12 DIAGNOSIS — F32A Depression, unspecified: Secondary | ICD-10-CM

## 2021-03-12 LAB — SARS CORONAVIRUS 2 (TAT 6-24 HRS): SARS Coronavirus 2: NEGATIVE

## 2021-03-13 ENCOUNTER — Ambulatory Visit (INDEPENDENT_AMBULATORY_CARE_PROVIDER_SITE_OTHER): Payer: Medicare HMO | Admitting: Internal Medicine

## 2021-03-13 ENCOUNTER — Encounter: Payer: Self-pay | Admitting: Family Medicine

## 2021-03-13 ENCOUNTER — Other Ambulatory Visit: Payer: Self-pay

## 2021-03-13 ENCOUNTER — Ambulatory Visit: Payer: Medicare HMO | Admitting: Internal Medicine

## 2021-03-13 ENCOUNTER — Encounter: Payer: Self-pay | Admitting: Internal Medicine

## 2021-03-13 VITALS — BP 128/88 | HR 74 | Ht 67.0 in | Wt 268.0 lb

## 2021-03-13 DIAGNOSIS — R0609 Other forms of dyspnea: Secondary | ICD-10-CM

## 2021-03-13 DIAGNOSIS — R0602 Shortness of breath: Secondary | ICD-10-CM

## 2021-03-13 DIAGNOSIS — G4733 Obstructive sleep apnea (adult) (pediatric): Secondary | ICD-10-CM

## 2021-03-13 DIAGNOSIS — R0600 Dyspnea, unspecified: Secondary | ICD-10-CM

## 2021-03-13 LAB — PULMONARY FUNCTION TEST
DL/VA % pred: 133 %
DL/VA: 5.93 ml/min/mmHg/L
DLCO cor % pred: 112 %
DLCO cor: 29.54 ml/min/mmHg
DLCO unc % pred: 112 %
DLCO unc: 29.54 ml/min/mmHg
FEF 25-75 Post: 3.34 L/sec
FEF 25-75 Pre: 3.32 L/sec
FEF2575-%Change-Post: 0 %
FEF2575-%Pred-Post: 109 %
FEF2575-%Pred-Pre: 108 %
FEV1-%Change-Post: 2 %
FEV1-%Pred-Post: 85 %
FEV1-%Pred-Pre: 83 %
FEV1-Post: 2.97 L
FEV1-Pre: 2.9 L
FEV1FVC-%Change-Post: 0 %
FEV1FVC-%Pred-Pre: 110 %
FEV6-%Change-Post: 1 %
FEV6-%Pred-Post: 80 %
FEV6-%Pred-Pre: 78 %
FEV6-Post: 3.46 L
FEV6-Pre: 3.4 L
FEV6FVC-%Pred-Post: 104 %
FEV6FVC-%Pred-Pre: 104 %
FVC-%Change-Post: 1 %
FVC-%Pred-Post: 77 %
FVC-%Pred-Pre: 75 %
FVC-Post: 3.46 L
FVC-Pre: 3.4 L
Post FEV1/FVC ratio: 86 %
Post FEV6/FVC ratio: 100 %
Pre FEV1/FVC ratio: 85 %
Pre FEV6/FVC Ratio: 100 %
RV % pred: 96 %
RV: 1.86 L
TLC % pred: 82 %
TLC: 5.24 L

## 2021-03-13 NOTE — Patient Instructions (Signed)
Full PFT performed today. °

## 2021-03-13 NOTE — Progress Notes (Signed)
Full PFT performed today. °

## 2021-03-13 NOTE — Progress Notes (Signed)
Daniel Durham    [id number]    12-27-1965  Primary Care Physician:Jones, Raynelle Dick, MD Date of Appointment: 03/13/2021 Established Patient Visit  Chief complaint:   Chief Complaint  Patient presents with  . Shortness of Breath    PFT     HPI: Harvey Matlack is a 55 y.o. gentleman with COVId 19 infection Jan 2021, with prolonged hospital stay  Interval Updates: Here for follow up after PFTs and sleep study Continues to have symptoms of excessive daytime sleepiness.  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   I have reviewed the patient's family social and past medical history and updated as appropriate.   Past Medical History:  Diagnosis Date  . Allergy   . Anxiety   . Asthma    SEASONAL   . Blood transfusion without reported diagnosis   . Chronic back pain    Neurostimulator  . Depression   . Environmental allergies   . Hypertension   . Neuromuscular disorder ([place])   . Neuropathy   . Seasonal allergic conjunctivitis     Past Surgical History:  Procedure Laterality Date  . BACK SURGERY    . CARDIAC CATHETERIZATION     11/05/10 [facility], [place]): Briding of mLAD, no sign disease. EF 45% in RAO, 60% LAO (51% stress, 54% rest by NM stress; 57-59% echo 10/2010)  . CARPAL TUNNEL RELEASE     Bilateral  . CERVICAL DISCECTOMY  12/17/2017   titanium plates 12-17-17  . COLONOSCOPY    . HAND AMPUTATION  2007  . ORIF ANKLE FRACTURE Left 02/23/2020   Procedure: OPEN REDUCTION INTERNAL FIXATION (ORIF) ANKLE FRACTURE;  Surgeon: Nicholes Stairs, MD;  Location: [facility];  Service: Orthopedics;  Laterality: Left;  . POLYPECTOMY     pt states had colon in [place] and he had polyps- he has no idea what type   . ROTATOR CUFF REPAIR     Left  . SPINAL CORD STIMULATOR IMPLANT    . SPINAL CORD  STIMULATOR REMOVAL N/A 10/26/2015   Procedure: LUMBAR SPINAL CORD STIMULATOR REMOVAL;  Surgeon: Clydell Hakim, MD;  Location: [facility];  Service: Neurosurgery;  Laterality: N/A;    Family History  Problem Relation Age of Onset  . Diabetes Mother        Living  . Sleep apnea Mother   . Diabetes Father 25       Deceased  . Hypertension Father   . Jaundice Father   . Hemophilia Father   . Alcoholism Father   . Cancer Maternal Grandfather        Throat  . Esophageal cancer Maternal Grandfather   . Cancer Paternal Uncle        Throat  . Esophageal cancer Paternal Uncle   . Asthma Sister   . Diabetes Sister   . Healthy Son        X1  . Healthy Daughter        X1  . Colon polyps Neg Hx   . Colon cancer Neg Hx   . Rectal cancer Neg Hx   . Stomach cancer Neg Hx     Social History   Occupational History  . Not on file  Tobacco Use  . Smoking status: Never Smoker  . Smokeless tobacco: Never Used  Vaping Use  . Vaping Use: Never used  Substance and Sexual Activity  . Alcohol use: Not Currently    Alcohol/week: 0.0 standard drinks    Comment:    . Drug use: No  . Sexual activity: Yes     Physical Exam: Blood pressure 128/88, pulse 74, height 5\' 7"  (1.702 m), weight 268 lb (121.6 kg), SpO2 95 %.  Gen:      No acute distress, obese ENT:  no nasal polyps, mucus membranes moist, mallampati 2 Lungs:    Diminished, no wheezes or crackles CV:         RRR, no mrg Ext:        Left hand amputation  Data Reviewed: Imaging: I have personally reviewed the CT Chest Angio Jan 2021 compared to October 2021 shows improved bilateral multifocal opacities.   PFTs: PFTs reviewed 03/13/2021 - normal pulmonary function HSAT shows AHI 11/hr  Labs: Lab Results  Component Value Date   WBC 7.2 12/26/2020   HGB 14.3 12/26/2020   HCT 42.0 12/26/2020   MCV 99.1 12/26/2020   PLT 186 12/26/2020   Lab Results  Component Value Date   NA 140 01/23/2021   K 4.4 01/23/2021   CL 105  01/23/2021   CO2 26 01/23/2021    Immunization status: Immunization History  Administered Date(s) Administered  . Influenza Inj Mdck Quad With Preservative 09/19/2017  . Influenza Split 09/10/2015  . Influenza,inj,Quad PF,6+ Mos 08/11/2016, 08/06/2017, 06/08/2018, 08/31/2019, 11/29/2020  . Influenza-Unspecified 09/23/2017  . Tdap 01/31/2015    Assessment:  OSA Dyspnea on exertion  Plan/Recommendations: Will start auto-CPAP 5-20cm H20 with nasal pillows. Despite mild OSA, he is very symptomatic and I suspect home test underestimtates his severity.  Dyspnea likely multifactorial from obesity, deconditioning, OSA. Continue albuterol as needed  Return to Care: Return in about 3 months (around 06/13/2021).   Lenice Llamas, MD Pulmonary and [place]

## 2021-03-13 NOTE — Patient Instructions (Signed)
The patient should have follow up scheduled with myself in 3 months.   We will prescribe your CPAP machine. Make sure your appointment with me is between 30-90 days of starting to use your machine.

## 2021-03-18 ENCOUNTER — Other Ambulatory Visit: Payer: Self-pay | Admitting: Family Medicine

## 2021-03-18 DIAGNOSIS — M5010 Cervical disc disorder with radiculopathy, unspecified cervical region: Secondary | ICD-10-CM

## 2021-03-18 DIAGNOSIS — F419 Anxiety disorder, unspecified: Secondary | ICD-10-CM

## 2021-03-18 DIAGNOSIS — F32A Depression, unspecified: Secondary | ICD-10-CM

## 2021-03-18 NOTE — Telephone Encounter (Signed)
Refill request for:  Alprazolam 0.25 mg LR 02/12/21, #30, 0 rf  Gabapentin 600 mg  LR 12/18/20, #270. 0 rf LOV 01/23/21 FOV 03/20/21  Please review and advise.  Thanks. Dm/cma

## 2021-03-20 ENCOUNTER — Ambulatory Visit: Payer: Medicare HMO | Admitting: Family Medicine

## 2021-03-20 DIAGNOSIS — R030 Elevated blood-pressure reading, without diagnosis of hypertension: Secondary | ICD-10-CM | POA: Diagnosis not present

## 2021-03-20 DIAGNOSIS — M545 Low back pain, unspecified: Secondary | ICD-10-CM | POA: Diagnosis not present

## 2021-03-20 DIAGNOSIS — G8929 Other chronic pain: Secondary | ICD-10-CM | POA: Diagnosis not present

## 2021-03-20 DIAGNOSIS — Z79899 Other long term (current) drug therapy: Secondary | ICD-10-CM | POA: Diagnosis not present

## 2021-03-20 DIAGNOSIS — Z6841 Body Mass Index (BMI) 40.0 and over, adult: Secondary | ICD-10-CM | POA: Diagnosis not present

## 2021-03-22 DIAGNOSIS — Z6841 Body Mass Index (BMI) 40.0 and over, adult: Secondary | ICD-10-CM | POA: Diagnosis not present

## 2021-03-22 DIAGNOSIS — I10 Essential (primary) hypertension: Secondary | ICD-10-CM | POA: Diagnosis not present

## 2021-03-22 DIAGNOSIS — E119 Type 2 diabetes mellitus without complications: Secondary | ICD-10-CM | POA: Diagnosis not present

## 2021-03-22 DIAGNOSIS — M109 Gout, unspecified: Secondary | ICD-10-CM | POA: Diagnosis not present

## 2021-03-22 DIAGNOSIS — E1169 Type 2 diabetes mellitus with other specified complication: Secondary | ICD-10-CM | POA: Diagnosis not present

## 2021-03-22 DIAGNOSIS — E782 Mixed hyperlipidemia: Secondary | ICD-10-CM | POA: Diagnosis not present

## 2021-03-28 ENCOUNTER — Telehealth: Payer: Self-pay | Admitting: Family Medicine

## 2021-03-28 NOTE — Telephone Encounter (Signed)
I spoke to patient and he will call back and schedule Medicare Annual Wellness Visit (AWV) in office.   If not able to come in office, please offer to do virtually or by telephone.   Last AWV:01/07/2017   Please schedule at anytime with Nurse Health Advisor.

## 2021-04-19 DIAGNOSIS — Z79899 Other long term (current) drug therapy: Secondary | ICD-10-CM | POA: Diagnosis not present

## 2021-04-19 DIAGNOSIS — G8929 Other chronic pain: Secondary | ICD-10-CM | POA: Diagnosis not present

## 2021-04-19 DIAGNOSIS — M545 Low back pain, unspecified: Secondary | ICD-10-CM | POA: Diagnosis not present

## 2021-04-22 DIAGNOSIS — E782 Mixed hyperlipidemia: Secondary | ICD-10-CM | POA: Diagnosis not present

## 2021-04-22 DIAGNOSIS — M109 Gout, unspecified: Secondary | ICD-10-CM | POA: Diagnosis not present

## 2021-04-22 DIAGNOSIS — E211 Secondary hyperparathyroidism, not elsewhere classified: Secondary | ICD-10-CM | POA: Diagnosis not present

## 2021-04-22 DIAGNOSIS — I10 Essential (primary) hypertension: Secondary | ICD-10-CM | POA: Diagnosis not present

## 2021-04-22 DIAGNOSIS — E1169 Type 2 diabetes mellitus with other specified complication: Secondary | ICD-10-CM | POA: Diagnosis not present

## 2021-04-22 DIAGNOSIS — Z6841 Body Mass Index (BMI) 40.0 and over, adult: Secondary | ICD-10-CM | POA: Diagnosis not present

## 2021-04-23 IMAGING — CT CT HEAD W/O CM
4 series · 17 of 47 positions shown, 19 images · non-contrast
Comparison: Head CT 3 days ago.

CLINICAL DATA: Nonspecific dizziness

EXAM:
CT HEAD WITHOUT CONTRAST
TECHNIQUE: Contiguous axial images were obtained from the base of the skull
through the vertex without intravenous contrast.

[Series 3: head bone · axial · 0.54mm/px · z∈[+1120,+1178]mm · 4 of 85 slices shown]
[im 9/85  bone]
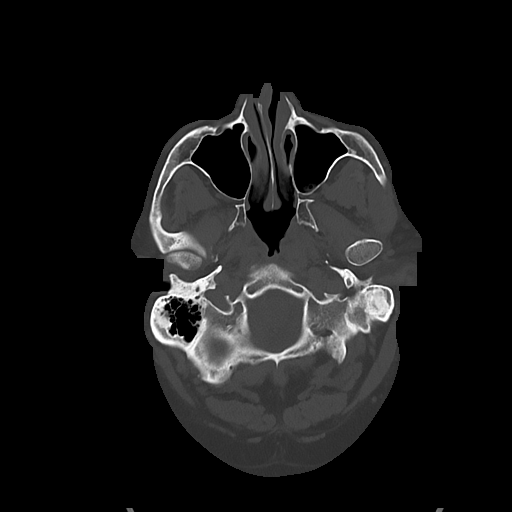
[im 17/85  bone]
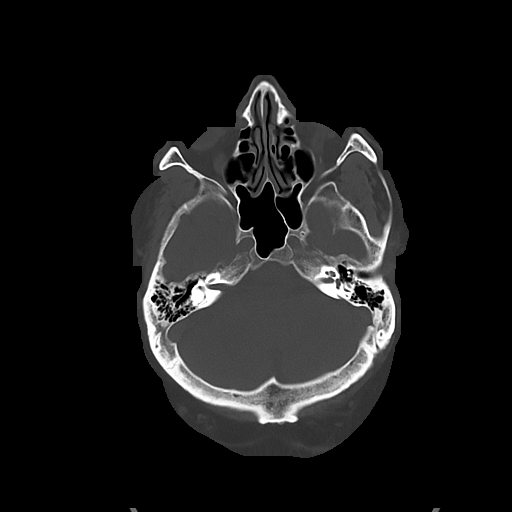
[im 26/85  bone]
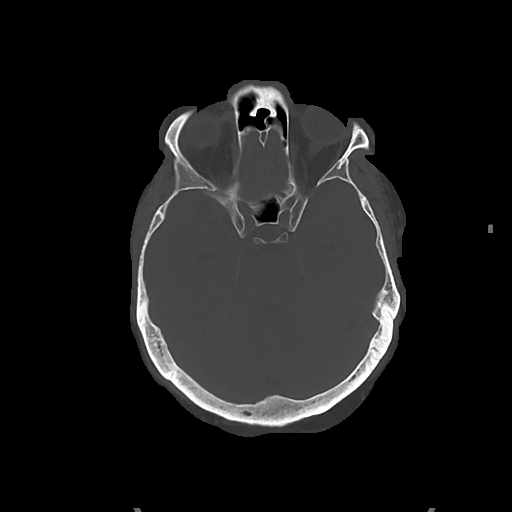
[im 38/85  bone]
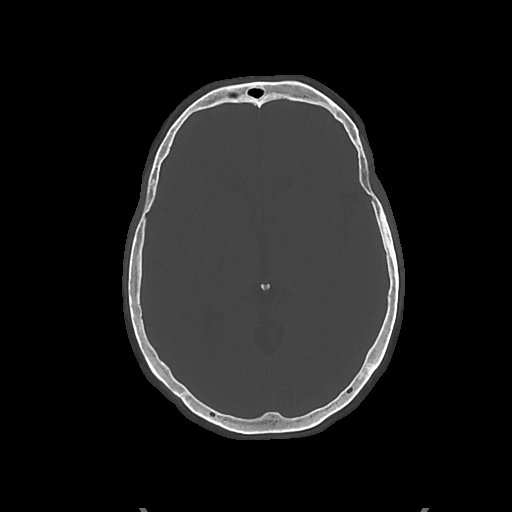

[Series 4: head wo · axial · 0.44mm/px · z∈[+1124,+1244]mm · 7 of 34 slices shown, 9 images]
[im 5/34  brain]
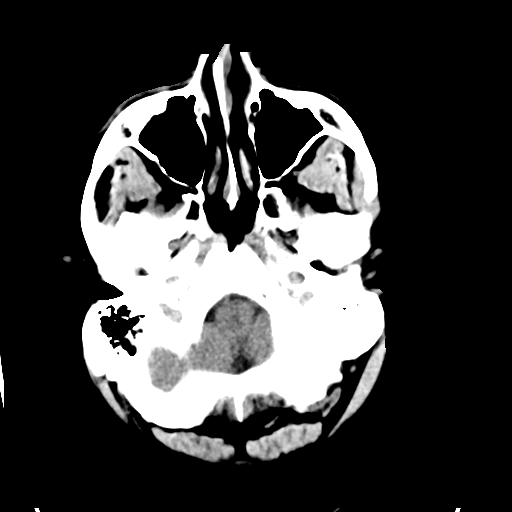
[im 5/34  bone]
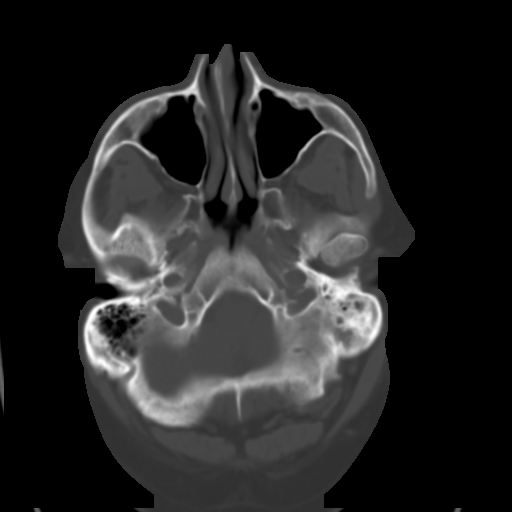
[im 9/34  brain]
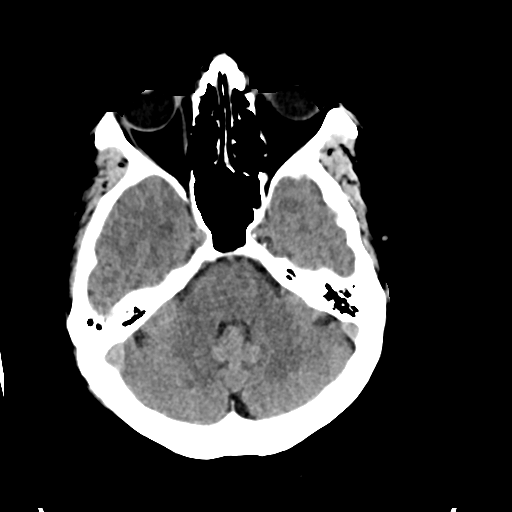
[im 13/34  brain]
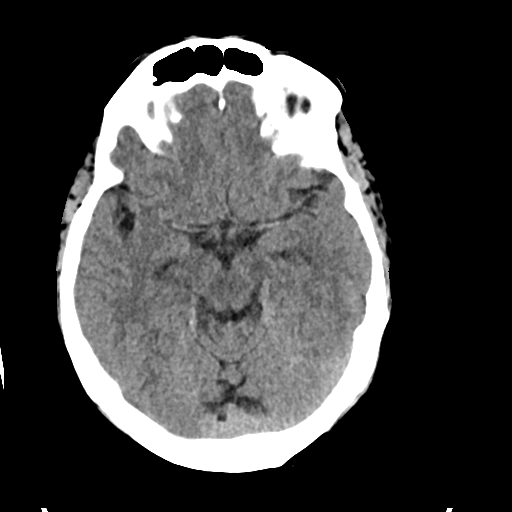
[im 17/34  brain]
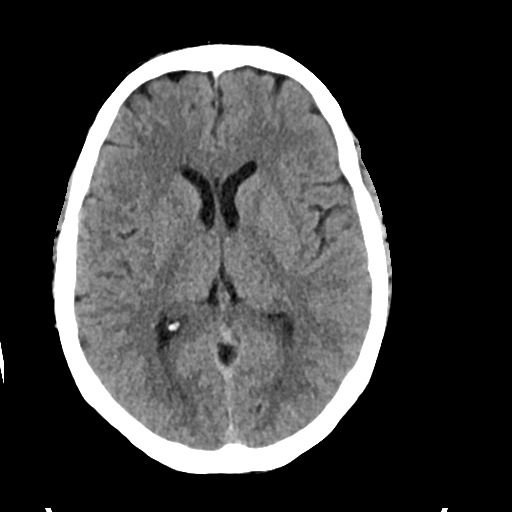
[im 21/34  brain]
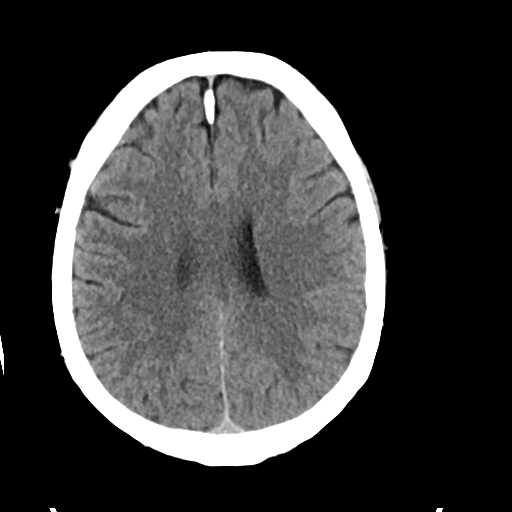
[im 21/34  bone]
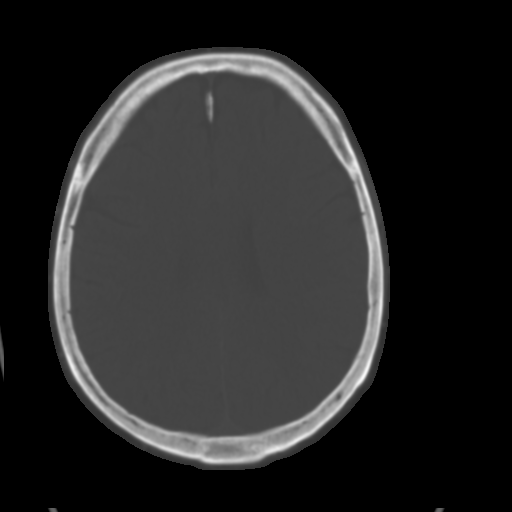
[im 25/34  brain]
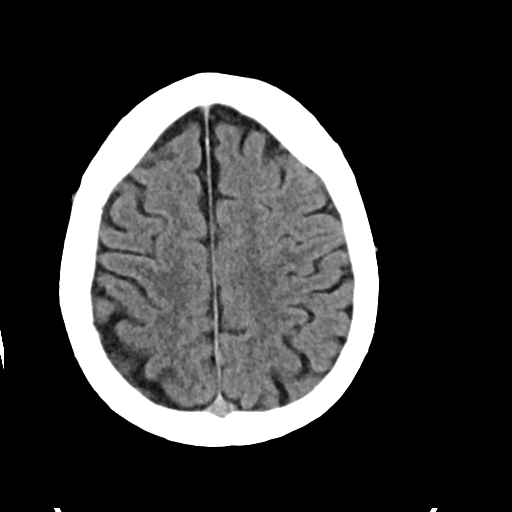
[im 29/34  brain]
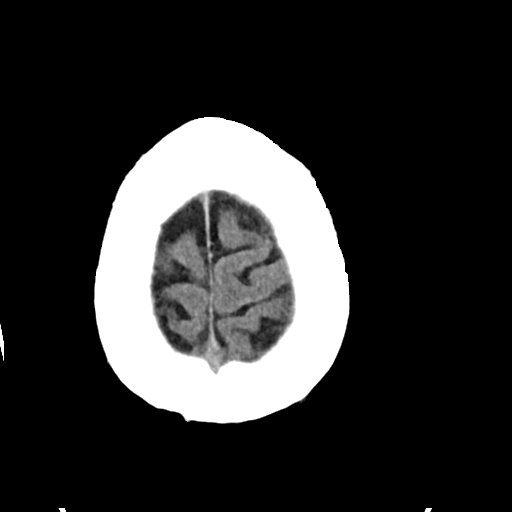

[Series 5: cor soft · coronal · 0.35mm/px · 3 of 80 slices shown]
[im 27/80  brain]
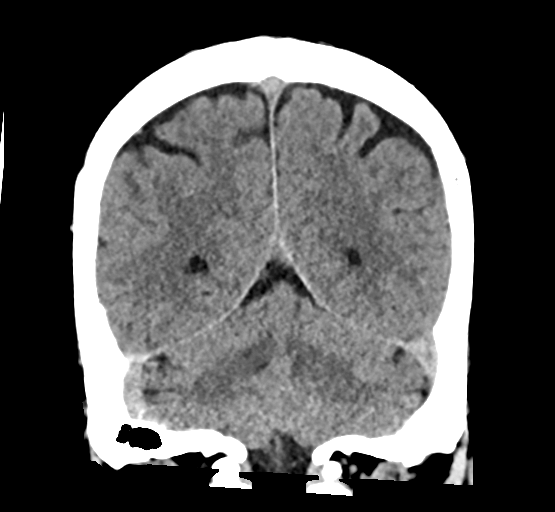
[im 36/80  brain]
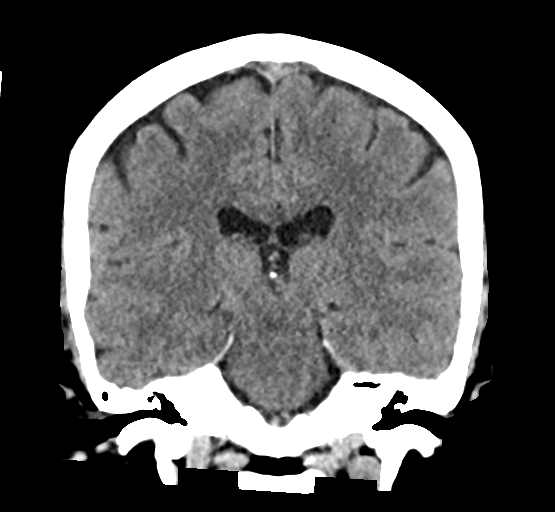
[im 44/80  brain]
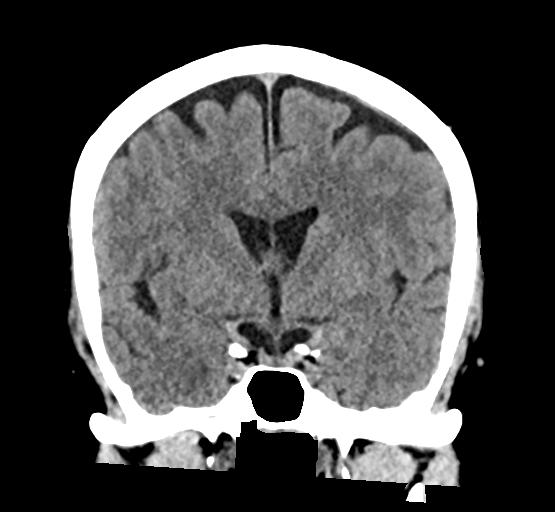

[Series 6: sag soft · sagittal · 0.36mm/px · 3 of 66 slices shown]
[im 22/66  brain]
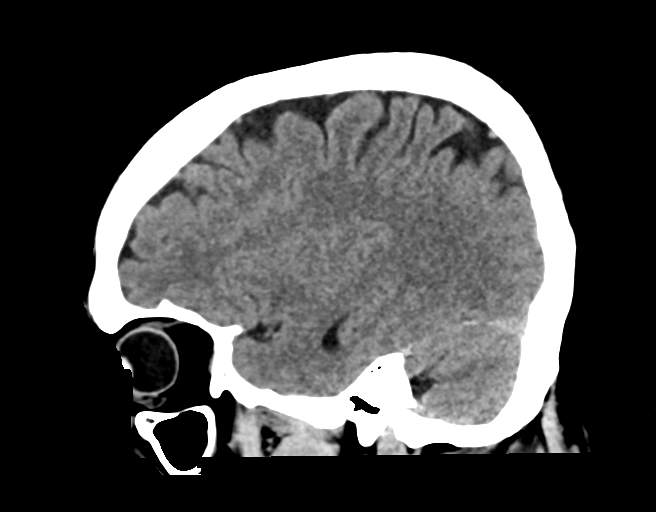
[im 33/66  brain]
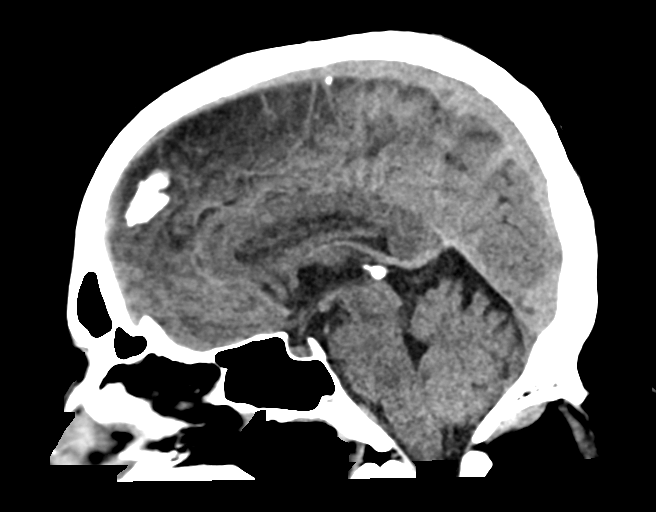
[im 44/66  brain]
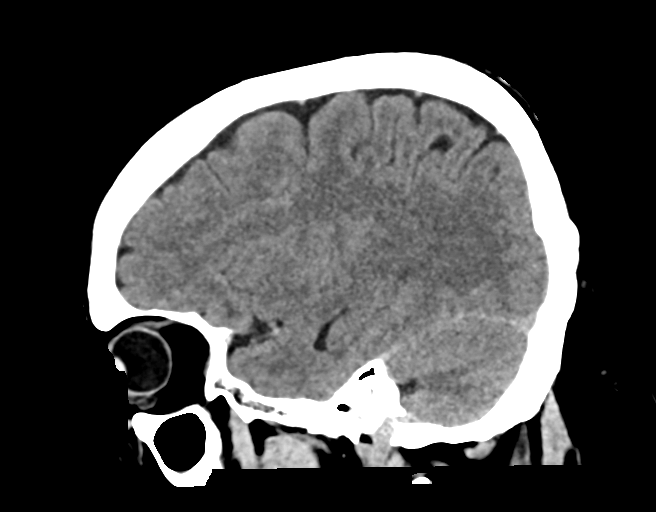

[17 of 47 positions shown; findings below may reference images not displayed]

FINDINGS: Brain: The brain shows a normal appearance without evidence of
malformation, atrophy, old or acute small or large vessel
infarction, mass lesion, hemorrhage, hydrocephalus or extra-axial
collection.

Vascular: No hyperdense vessel. No evidence of atherosclerotic
calcification.

Skull: Normal.  No traumatic finding.  No focal bone lesion.

Sinuses/Orbits: Sinuses are clear. Orbits appear normal. Mastoids
are clear.

Other: None significant
IMPRESSION: Normal head CT.

## 2021-05-17 DIAGNOSIS — Z79891 Long term (current) use of opiate analgesic: Secondary | ICD-10-CM | POA: Diagnosis not present

## 2021-05-17 DIAGNOSIS — Z6841 Body Mass Index (BMI) 40.0 and over, adult: Secondary | ICD-10-CM | POA: Diagnosis not present

## 2021-05-17 DIAGNOSIS — J449 Chronic obstructive pulmonary disease, unspecified: Secondary | ICD-10-CM | POA: Diagnosis not present

## 2021-05-17 DIAGNOSIS — E210 Primary hyperparathyroidism: Secondary | ICD-10-CM | POA: Diagnosis not present

## 2021-05-17 DIAGNOSIS — M545 Low back pain, unspecified: Secondary | ICD-10-CM | POA: Diagnosis not present

## 2021-05-17 DIAGNOSIS — G8929 Other chronic pain: Secondary | ICD-10-CM | POA: Diagnosis not present

## 2021-05-17 DIAGNOSIS — M109 Gout, unspecified: Secondary | ICD-10-CM | POA: Diagnosis not present

## 2021-06-14 DIAGNOSIS — F3160 Bipolar disorder, current episode mixed, unspecified: Secondary | ICD-10-CM | POA: Diagnosis not present

## 2021-06-14 DIAGNOSIS — I10 Essential (primary) hypertension: Secondary | ICD-10-CM | POA: Diagnosis not present

## 2021-06-14 DIAGNOSIS — M109 Gout, unspecified: Secondary | ICD-10-CM | POA: Diagnosis not present

## 2021-06-14 DIAGNOSIS — Z6841 Body Mass Index (BMI) 40.0 and over, adult: Secondary | ICD-10-CM | POA: Diagnosis not present

## 2021-06-14 DIAGNOSIS — E210 Primary hyperparathyroidism: Secondary | ICD-10-CM | POA: Diagnosis not present

## 2021-06-14 DIAGNOSIS — Z79891 Long term (current) use of opiate analgesic: Secondary | ICD-10-CM | POA: Diagnosis not present

## 2021-06-25 ENCOUNTER — Telehealth: Payer: Self-pay | Admitting: *Deleted

## 2021-06-25 NOTE — Chronic Care Management (AMB) (Signed)
  Care Management   Note  06/25/2021 Name: [name] MRN: [id number] DOB: [birth year]  Daniel Durham is a 55 y.o. year old male who is a primary care patient of Haydee Salter, MD and is actively engaged with the care management team. I reached out to [employer] by phone today to assist with re-scheduling an initial visit with the Pharmacist  Follow up plan: Unsuccessful telephone outreach attempt made. A HIPAA compliant phone message was left for the patient providing contact information and requesting a return call.   Julian Hy, [facility]  Direct Dial: [phone]

## 2021-06-27 NOTE — Chronic Care Management (AMB) (Signed)
  Care Management   Note  06/27/2021 Name: Daniel Durham MRN: [id number] DOB: 26-Jun-1966  Daniel Durham is a 55 y.o. year old male who is a primary care patient of Haydee Salter, MD and is actively engaged with the care management team. I reached out to [name] by phone today to assist with re-scheduling an initial visit with the Pharmacist  Follow up plan: Patient declines further follow up and engagement by the care management team. Daniel Durham says he is no longer a patient a Daniel Durham   Julian Hy, [facility]  Direct Dial: [phone]

## 2021-07-05 DIAGNOSIS — R059 Cough, unspecified: Secondary | ICD-10-CM | POA: Diagnosis not present

## 2021-07-05 DIAGNOSIS — R531 Weakness: Secondary | ICD-10-CM | POA: Diagnosis not present

## 2021-07-05 DIAGNOSIS — R519 Headache, unspecified: Secondary | ICD-10-CM | POA: Diagnosis not present

## 2021-07-05 DIAGNOSIS — M7918 Myalgia, other site: Secondary | ICD-10-CM | POA: Diagnosis not present

## 2021-07-05 DIAGNOSIS — R630 Anorexia: Secondary | ICD-10-CM | POA: Diagnosis not present

## 2021-07-05 DIAGNOSIS — R0602 Shortness of breath: Secondary | ICD-10-CM | POA: Diagnosis not present

## 2021-07-05 DIAGNOSIS — U071 COVID-19: Secondary | ICD-10-CM | POA: Diagnosis not present

## 2021-07-05 DIAGNOSIS — R0789 Other chest pain: Secondary | ICD-10-CM | POA: Diagnosis not present

## 2021-07-05 DIAGNOSIS — R6883 Chills (without fever): Secondary | ICD-10-CM | POA: Diagnosis not present

## 2021-07-17 DIAGNOSIS — K8689 Other specified diseases of pancreas: Secondary | ICD-10-CM | POA: Diagnosis not present

## 2021-07-17 DIAGNOSIS — S3991XA Unspecified injury of abdomen, initial encounter: Secondary | ICD-10-CM | POA: Diagnosis not present

## 2021-07-17 DIAGNOSIS — S0990XA Unspecified injury of head, initial encounter: Secondary | ICD-10-CM | POA: Diagnosis not present

## 2021-07-17 DIAGNOSIS — S299XXA Unspecified injury of thorax, initial encounter: Secondary | ICD-10-CM | POA: Diagnosis not present

## 2021-07-17 DIAGNOSIS — S199XXA Unspecified injury of neck, initial encounter: Secondary | ICD-10-CM | POA: Diagnosis not present

## 2021-07-17 DIAGNOSIS — Z7901 Long term (current) use of anticoagulants: Secondary | ICD-10-CM | POA: Diagnosis not present

## 2021-07-17 DIAGNOSIS — G8929 Other chronic pain: Secondary | ICD-10-CM | POA: Diagnosis not present

## 2021-07-17 DIAGNOSIS — M542 Cervicalgia: Secondary | ICD-10-CM | POA: Diagnosis not present

## 2021-07-17 DIAGNOSIS — S3993XA Unspecified injury of pelvis, initial encounter: Secondary | ICD-10-CM | POA: Diagnosis not present

## 2021-07-17 DIAGNOSIS — M549 Dorsalgia, unspecified: Secondary | ICD-10-CM | POA: Diagnosis not present

## 2021-07-17 DIAGNOSIS — R9089 Other abnormal findings on diagnostic imaging of central nervous system: Secondary | ICD-10-CM | POA: Diagnosis not present

## 2021-07-17 DIAGNOSIS — S3992XA Unspecified injury of lower back, initial encounter: Secondary | ICD-10-CM | POA: Diagnosis not present

## 2021-07-17 DIAGNOSIS — U071 COVID-19: Secondary | ICD-10-CM | POA: Diagnosis not present

## 2021-07-17 DIAGNOSIS — I429 Cardiomyopathy, unspecified: Secondary | ICD-10-CM | POA: Diagnosis not present

## 2021-07-20 DIAGNOSIS — J811 Chronic pulmonary edema: Secondary | ICD-10-CM | POA: Diagnosis not present

## 2021-07-20 DIAGNOSIS — R0989 Other specified symptoms and signs involving the circulatory and respiratory systems: Secondary | ICD-10-CM | POA: Diagnosis not present

## 2021-07-20 DIAGNOSIS — R079 Chest pain, unspecified: Secondary | ICD-10-CM | POA: Diagnosis not present

## 2021-07-20 DIAGNOSIS — S199XXA Unspecified injury of neck, initial encounter: Secondary | ICD-10-CM | POA: Diagnosis not present

## 2021-07-20 DIAGNOSIS — R9431 Abnormal electrocardiogram [ECG] [EKG]: Secondary | ICD-10-CM | POA: Diagnosis not present

## 2021-07-20 DIAGNOSIS — M549 Dorsalgia, unspecified: Secondary | ICD-10-CM | POA: Diagnosis not present

## 2021-07-20 DIAGNOSIS — Z981 Arthrodesis status: Secondary | ICD-10-CM | POA: Diagnosis not present

## 2021-07-20 DIAGNOSIS — I517 Cardiomegaly: Secondary | ICD-10-CM | POA: Diagnosis not present

## 2021-07-21 DIAGNOSIS — R9431 Abnormal electrocardiogram [ECG] [EKG]: Secondary | ICD-10-CM | POA: Diagnosis not present

## 2021-07-24 DIAGNOSIS — I10 Essential (primary) hypertension: Secondary | ICD-10-CM | POA: Diagnosis not present

## 2021-07-24 DIAGNOSIS — Z79891 Long term (current) use of opiate analgesic: Secondary | ICD-10-CM | POA: Diagnosis not present

## 2021-07-24 DIAGNOSIS — M109 Gout, unspecified: Secondary | ICD-10-CM | POA: Diagnosis not present

## 2021-07-24 DIAGNOSIS — E782 Mixed hyperlipidemia: Secondary | ICD-10-CM | POA: Diagnosis not present

## 2021-07-24 DIAGNOSIS — E119 Type 2 diabetes mellitus without complications: Secondary | ICD-10-CM | POA: Diagnosis not present

## 2021-07-24 DIAGNOSIS — Z6841 Body Mass Index (BMI) 40.0 and over, adult: Secondary | ICD-10-CM | POA: Diagnosis not present

## 2021-07-24 DIAGNOSIS — G8929 Other chronic pain: Secondary | ICD-10-CM | POA: Diagnosis not present

## 2021-07-24 DIAGNOSIS — E1169 Type 2 diabetes mellitus with other specified complication: Secondary | ICD-10-CM | POA: Diagnosis not present

## 2021-07-24 DIAGNOSIS — M545 Low back pain, unspecified: Secondary | ICD-10-CM | POA: Diagnosis not present

## 2021-07-26 DIAGNOSIS — Z79899 Other long term (current) drug therapy: Secondary | ICD-10-CM | POA: Diagnosis not present

## 2021-07-26 DIAGNOSIS — F4310 Post-traumatic stress disorder, unspecified: Secondary | ICD-10-CM | POA: Diagnosis not present

## 2021-07-26 DIAGNOSIS — F3160 Bipolar disorder, current episode mixed, unspecified: Secondary | ICD-10-CM | POA: Diagnosis not present

## 2021-07-26 DIAGNOSIS — Z6841 Body Mass Index (BMI) 40.0 and over, adult: Secondary | ICD-10-CM | POA: Diagnosis not present

## 2021-08-28 DIAGNOSIS — F4310 Post-traumatic stress disorder, unspecified: Secondary | ICD-10-CM | POA: Diagnosis not present

## 2021-08-28 DIAGNOSIS — Z79891 Long term (current) use of opiate analgesic: Secondary | ICD-10-CM | POA: Diagnosis not present

## 2021-08-28 DIAGNOSIS — Z6841 Body Mass Index (BMI) 40.0 and over, adult: Secondary | ICD-10-CM | POA: Diagnosis not present

## 2021-08-28 DIAGNOSIS — I10 Essential (primary) hypertension: Secondary | ICD-10-CM | POA: Diagnosis not present

## 2021-08-28 DIAGNOSIS — F3160 Bipolar disorder, current episode mixed, unspecified: Secondary | ICD-10-CM | POA: Diagnosis not present

## 2021-08-28 DIAGNOSIS — M545 Low back pain, unspecified: Secondary | ICD-10-CM | POA: Diagnosis not present

## 2021-08-30 DIAGNOSIS — Z79899 Other long term (current) drug therapy: Secondary | ICD-10-CM | POA: Diagnosis not present

## 2021-11-21 ENCOUNTER — Other Ambulatory Visit: Payer: Self-pay | Admitting: Family Medicine

## 2021-11-21 DIAGNOSIS — M5010 Cervical disc disorder with radiculopathy, unspecified cervical region: Secondary | ICD-10-CM

## 2021-11-21 NOTE — Telephone Encounter (Signed)
Refill request for: Gabapentin 600 mg LR 03/18/21, #270, 0 rf LOV 01/23/21 FOV  none scheduled.    Please review and advise.  Thanks. Dm/cma

## 2022-07-21 ENCOUNTER — Ambulatory Visit: Payer: Medicare HMO | Admitting: Internal Medicine
